# Patient Record
Sex: Male | Born: 1970 | Race: Asian | Hispanic: No | Marital: Married | Smoking: Former smoker
Health system: Southern US, Community
[De-identification: ages and names within clinical notes are randomized; demographics above are authoritative.]

## PROBLEM LIST (undated history)

## (undated) DIAGNOSIS — K52832 Lymphocytic colitis: Secondary | ICD-10-CM

## (undated) DIAGNOSIS — K7460 Unspecified cirrhosis of liver: Secondary | ICD-10-CM

## (undated) DIAGNOSIS — D61818 Other pancytopenia: Secondary | ICD-10-CM

## (undated) DIAGNOSIS — B1910 Unspecified viral hepatitis B without hepatic coma: Secondary | ICD-10-CM

## (undated) DIAGNOSIS — A159 Respiratory tuberculosis unspecified: Secondary | ICD-10-CM

## (undated) DIAGNOSIS — E119 Type 2 diabetes mellitus without complications: Secondary | ICD-10-CM

## (undated) HISTORY — DX: Unspecified viral hepatitis B without hepatic coma: B19.10

## (undated) HISTORY — DX: Respiratory tuberculosis unspecified: A15.9

## (undated) HISTORY — DX: Unspecified cirrhosis of liver: K74.60

## (undated) HISTORY — DX: Lymphocytic colitis: K52.832

## (undated) HISTORY — DX: Type 2 diabetes mellitus without complications: E11.9

---

## 2007-11-02 ENCOUNTER — Ambulatory Visit: Payer: Self-pay | Admitting: Family Medicine

## 2007-11-02 DIAGNOSIS — M545 Low back pain, unspecified: Secondary | ICD-10-CM | POA: Insufficient documentation

## 2007-11-27 ENCOUNTER — Emergency Department (HOSPITAL_COMMUNITY): Admission: EM | Admit: 2007-11-27 | Discharge: 2007-11-27 | Payer: Self-pay | Admitting: Emergency Medicine

## 2008-01-05 ENCOUNTER — Ambulatory Visit: Payer: Self-pay | Admitting: Family Medicine

## 2008-01-05 DIAGNOSIS — K219 Gastro-esophageal reflux disease without esophagitis: Secondary | ICD-10-CM

## 2008-01-28 ENCOUNTER — Emergency Department (HOSPITAL_COMMUNITY): Admission: EM | Admit: 2008-01-28 | Discharge: 2008-01-28 | Payer: Self-pay | Admitting: Emergency Medicine

## 2010-05-15 ENCOUNTER — Inpatient Hospital Stay (INDEPENDENT_AMBULATORY_CARE_PROVIDER_SITE_OTHER)
Admission: RE | Admit: 2010-05-15 | Discharge: 2010-05-15 | Disposition: A | Discharge status: Home / Self Care | Payer: Self-pay | Source: Ambulatory Visit | Attending: Family Medicine | Admitting: Family Medicine

## 2010-05-15 DIAGNOSIS — I889 Nonspecific lymphadenitis, unspecified: Secondary | ICD-10-CM

## 2010-06-25 ENCOUNTER — Inpatient Hospital Stay (INDEPENDENT_AMBULATORY_CARE_PROVIDER_SITE_OTHER)
Admission: RE | Admit: 2010-06-25 | Discharge: 2010-06-25 | Disposition: A | Discharge status: Home / Self Care | Payer: Self-pay | Source: Ambulatory Visit | Attending: Emergency Medicine | Admitting: Emergency Medicine

## 2010-06-25 ENCOUNTER — Ambulatory Visit (INDEPENDENT_AMBULATORY_CARE_PROVIDER_SITE_OTHER): Payer: Self-pay

## 2010-06-25 DIAGNOSIS — R05 Cough: Secondary | ICD-10-CM

## 2010-07-05 ENCOUNTER — Inpatient Hospital Stay (INDEPENDENT_AMBULATORY_CARE_PROVIDER_SITE_OTHER)
Admission: RE | Admit: 2010-07-05 | Discharge: 2010-07-05 | Disposition: A | Discharge status: Home / Self Care | Payer: Self-pay | Source: Ambulatory Visit | Attending: Family Medicine | Admitting: Family Medicine

## 2010-07-05 DIAGNOSIS — K219 Gastro-esophageal reflux disease without esophagitis: Secondary | ICD-10-CM

## 2010-07-11 ENCOUNTER — Encounter: Payer: Self-pay | Admitting: Internal Medicine

## 2010-07-16 ENCOUNTER — Encounter: Payer: Self-pay | Admitting: *Deleted

## 2010-07-16 ENCOUNTER — Encounter: Payer: Self-pay | Admitting: Internal Medicine

## 2010-07-16 ENCOUNTER — Ambulatory Visit (INDEPENDENT_AMBULATORY_CARE_PROVIDER_SITE_OTHER): Payer: Self-pay | Admitting: Internal Medicine

## 2010-07-16 VITALS — BP 90/60 | HR 61 | Temp 98.0°F | Ht 64.0 in | Wt 121.2 lb

## 2010-07-16 DIAGNOSIS — Z8611 Personal history of tuberculosis: Secondary | ICD-10-CM | POA: Insufficient documentation

## 2010-07-16 DIAGNOSIS — Z201 Contact with and (suspected) exposure to tuberculosis: Secondary | ICD-10-CM

## 2010-07-16 NOTE — Patient Instructions (Addendum)
As long as you don't develop a persistent cough, worse breathing, unexplained weight loss,  Soaking night sweats or chest pain breathing you don't need to return here.   I would recommend a yearly cxr within [facility].   Ok to return to work today

## 2010-07-16 NOTE — Progress Notes (Signed)
  Subjective:    Patient ID: Jose Snow, male    DOB: [birth year], 40 y.o.   MRN: [id number]  HPI 40 yo [place]ian quit smoking 1992 hospitalized in [place] around 2002 with dx of TB x 6 months then arrived here in Aug 2002 and eval [facility] and completed another 9 months and released.  07/16/2010 Initial pulmonary office eval for new cough x 2 weeks ago now resolved p seen in Urgent Care rx prednisone, abx and 100% back to nl at time of ov.  Pt denies any significant sore throat, dysphagia, itching, sneezing,  nasal congestion or excess/ purulent secretions,  fever, chills, sweats, unintended wt loss, pleuritic or exertional cp, hempoptysis, orthopnea pnd or leg swelling.    Also denies any obvious fluctuation of symptoms with weather or environmental changes or other aggravating or alleviating factors.     Review of Systems  Constitutional: Negative for fever, chills, activity change, appetite change and unexpected weight change.  HENT: Negative for congestion, sore throat, rhinorrhea, sneezing, trouble swallowing, dental problem, voice change and postnasal drip.   Eyes: Negative for visual disturbance.  Respiratory: Negative for cough, choking and shortness of breath.   Cardiovascular: Negative for chest pain and leg swelling.  Gastrointestinal: Negative for nausea, vomiting and abdominal pain.  Genitourinary: Negative for difficulty urinating.  Musculoskeletal: Negative for arthralgias.  Skin: Negative for rash.  Psychiatric/Behavioral: Negative for behavioral problems and confusion.       Objective:   Physical Exam    thin pleasant amb asian male nad  HEENT: nl dentition, turbinates, and orophanx. Nl external ear canals without cough reflex   NECK :  without JVD/Nodes/TM/ nl carotid upstrokes bilaterally   LUNGS: no acc muscle use, clear to A and P bilaterally without cough on insp or exp maneuvers   CV:  RRR  no s3 or murmur or increase in P2, no edema   ABD:  soft  and nontender with nl excursion in the supine position. No bruits or organomegaly, bowel sounds nl  MS:  warm without deformities, calf tenderness, cyanosis or clubbing  SKIN: warm and dry without lesions    NEURO:  alert, approp, no deficits    cxr 06/25/10 1. Partially calcified reticular nodular opacities, slightly upper  lobe predominant. Favor the sequelae of atypical bacterial  infection. Remote tuberculosis could have this appearance.  Comparison with prior radiographs versus further evaluation with  chest CT should be considered.  2. Left costophrenic angle blunting with patchy left base opacity,  likely scarring.   Assessment & Plan:

## 2010-07-17 ENCOUNTER — Encounter: Payer: Self-pay | Admitting: Internal Medicine

## 2010-07-17 NOTE — Assessment & Plan Note (Signed)
No evidence of active TB with complete resolution of symptoms and cxr c/w prevously treated TB  See instructions

## 2012-11-11 ENCOUNTER — Emergency Department (HOSPITAL_COMMUNITY)
Admission: EM | Admit: 2012-11-11 | Discharge: 2012-11-11 | Disposition: A | Discharge status: Home / Self Care | Payer: BC Managed Care – PPO | Source: Home / Self Care | Attending: Family Medicine | Admitting: Family Medicine

## 2012-11-11 ENCOUNTER — Encounter (HOSPITAL_COMMUNITY): Payer: Self-pay | Admitting: Emergency Medicine

## 2012-11-11 DIAGNOSIS — M545 Low back pain: Secondary | ICD-10-CM

## 2012-11-11 DIAGNOSIS — M5432 Sciatica, left side: Secondary | ICD-10-CM

## 2012-11-11 DIAGNOSIS — M5430 Sciatica, unspecified side: Secondary | ICD-10-CM

## 2012-11-11 MED ORDER — PREDNISONE 20 MG PO TABS: ORAL_TABLET | ORAL | Status: DC

## 2012-11-11 MED ORDER — TRAMADOL HCL 50 MG PO TABS: 50.0000 mg | ORAL_TABLET | Freq: Three times a day (TID) | ORAL | Status: DC | PRN

## 2012-11-11 MED ORDER — CYCLOBENZAPRINE HCL 10 MG PO TABS: 10.0000 mg | ORAL_TABLET | Freq: Two times a day (BID) | ORAL | Status: DC | PRN

## 2012-11-11 MED ORDER — IBUPROFEN 600 MG PO TABS: 600.0000 mg | ORAL_TABLET | Freq: Three times a day (TID) | ORAL | Status: DC | PRN

## 2012-11-11 NOTE — ED Notes (Signed)
Pt c/o left leg/thigh pain onset 1 week... Reports pain starts at lower back and radiates down left leg... Denies inj/trauma, strenuous activity, tingly/numbness... Alert w/no signs of acute distress.

## 2012-11-11 NOTE — ED Provider Notes (Signed)
CSN: [id number]     Arrival date & time 11/11/12  1025 History     First MD Initiated Contact with Patient 11/11/12 1115     Chief Complaint  Patient presents with  . Back Pain   (Consider location/radiation/quality/duration/timing/severity/associated sxs/prior Treatment) HPI Comments: 42 year old male originally from [place] but no significant past medical history. Here complaining of low back pain worse on the left side with radiation to the left posterior thigh for one week. Denies urinary symptoms like frequency dysuria hematuria, difficulty voiding or incontinence. Denies abdominal pain. Denies low extremity numbness or weakness. Denies recent trauma or falls. 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 He used to have a strange work where he had to [employer] may years ago back in [place]. No limping.   Past Medical History  Diagnosis Date  . 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    History reviewed. No pertinent past surgical history. No family history on file. History  Substance Use Topics  . Smoking status: Former Smoker -- 0.50 packs/day for 8 years    Types: Cigarettes    Quit date: 04/08/1990  . Smokeless tobacco: Never Used  . Alcohol Use: No    Review of Systems  Constitutional: Negative for fever, chills, diaphoresis, appetite change and fatigue.  HENT: Negative for neck pain.   Respiratory: Negative for cough and shortness of breath.   Gastrointestinal: Negative for nausea, abdominal pain, diarrhea and constipation.  Endocrine: Negative for cold intolerance, heat intolerance, polydipsia, polyphagia and polyuria.  Genitourinary: Negative for dysuria, hematuria and flank pain.  Musculoskeletal: Positive for back pain.  Skin: Negative for rash.  Neurological: Negative for dizziness and headaches.  All other systems reviewed and are negative.    Allergies  Review of patient's allergies indicates no  known allergies.  Home Medications   Current Outpatient Rx  Name  Route  Sig  Dispense  Refill  . cyclobenzaprine (FLEXERIL) 10 MG tablet   Oral   Take 1 tablet (10 mg total) by mouth 2 (two) times daily as needed for muscle spasms.   20 tablet   0   . ibuprofen (ADVIL,MOTRIN) 600 MG tablet   Oral   Take 1 tablet (600 mg total) by mouth every 8 (eight) hours as needed for pain.   21 tablet   0   . predniSONE (DELTASONE) 20 MG tablet      2 tabs po daily for 5 days   10 tablet   0   . traMADol (ULTRAM) 50 MG tablet   Oral   Take 1 tablet (50 mg total) by mouth every 8 (eight) hours as needed for pain.   21 tablet   0    BP 111/68  Pulse 56  Temp(Src) 97.2 F (36.2 C) (Oral)  Resp 16  SpO2 98% Physical Exam  Nursing note and vitals reviewed. Constitutional: He is oriented to person, place, and time. He appears well-developed and well-nourished. No distress.  HENT:  Head: Normocephalic and atraumatic.  Eyes: No scleral icterus.  Neck: Neck supple.  Cardiovascular: Normal heart sounds.   Pulmonary/Chest: Breath sounds normal.  Abdominal: Soft. He exhibits no mass. There is no tenderness.  Musculoskeletal:  Central spine with no scoliosis or kyphosis. Fair anterior flexion and posterior extension. Patient able to walk on tip toes but this triggered his pain with radiation to left posterior thigh. Able to walk on heels with no difficulty foot drop  or pain exacerbation. No bone prominence tenderness. Tenderness to palpation in bilateral lumbar paravertebral muscles worse in left side. Negative straight leg test bilateral. Intact sensation and symmetric + DTRs (rotullian and achillean) in low extremities.   Neurological: He is alert and oriented to person, place, and time.  Skin: No rash noted. He is not diaphoretic.    ED Course   Procedures (including critical care time)  Labs Reviewed - No data to display No results found. 1. Low back pain radiating to left  leg   2. Sciatica neuralgia, left     MDM  Possible S1 root nerve irritation/compression but no paresthesias or numbness possible transitory. Prescribed Flexeril, ibuprofen, prednisone and tramadol. Supportive care including rehabilitation exercises discussed with patient and provided in writing. Asked to establish primary care to monitor symptoms. Contact information for the [facility] provided.  Randa Spike, MD 11/12/12 [phone]

## 2012-11-17 ENCOUNTER — Encounter (HOSPITAL_COMMUNITY): Payer: Self-pay | Admitting: Emergency Medicine

## 2012-11-17 ENCOUNTER — Emergency Department (HOSPITAL_COMMUNITY)
Admission: EM | Admit: 2012-11-17 | Discharge: 2012-11-17 | Disposition: A | Discharge status: Home / Self Care | Payer: BC Managed Care – PPO | Source: Home / Self Care

## 2012-11-17 DIAGNOSIS — M5430 Sciatica, unspecified side: Secondary | ICD-10-CM

## 2012-11-17 DIAGNOSIS — M5432 Sciatica, left side: Secondary | ICD-10-CM

## 2012-11-17 MED ORDER — TRAMADOL HCL 50 MG PO TABS: 50.0000 mg | ORAL_TABLET | Freq: Three times a day (TID) | ORAL | Status: DC | PRN

## 2012-11-17 NOTE — ED Notes (Signed)
Pt states that the med's that were given on 8/6 caused skin to itch and have a burning sensation also causing stomach to be up set.  Pt states that he stopped taking med. Would like to try different medication for leg pain

## 2012-11-17 NOTE — ED Provider Notes (Signed)
Jose Snow is a 42 y.o. male who presents to Urgent Care today for left leg radicular pain. He was seen on August 8 for the same problem and treated with prednisone Dosepak, tramadol, ibuprofen, and Flexeril. He notes that his pain is worsened however he denies any numbness or weakness. Additionally he denies any bowel bladder dysfunction. He notes that he developed some mild abdominal discomfort as well as fatigue with the medications. He denies any injury fevers or chills and feels well otherwise.   PMH reviewed. healthy  History  Substance Use Topics  . Smoking status: Former Smoker -- 0.50 packs/day for 8 years    Types: Cigarettes    Quit date: 04/08/1990  . Smokeless tobacco: Never Used  . Alcohol Use: No   ROS as above Medications reviewed. No current facility-administered medications for this encounter.   Current Outpatient Prescriptions  Medication Sig Dispense Refill  . cyclobenzaprine (FLEXERIL) 10 MG tablet Take 1 tablet (10 mg total) by mouth 2 (two) times daily as needed for muscle spasms.  20 tablet  0  . predniSONE (DELTASONE) 20 MG tablet 2 tabs po daily for 5 days  10 tablet  0  . traMADol (ULTRAM) 50 MG tablet Take 1 tablet (50 mg total) by mouth every 8 (eight) hours as needed for pain.  30 tablet  0    Exam:  BP 121/69  Pulse 80  Temp(Src) 98.2 F (36.8 C) (Oral)  Resp 17  SpO2 100% Gen: Well NAD HEENT: EOMI,  MMM Lungs: CTABL Nl WOB Heart: RRR no MRG Abd: NABS, NT, ND Exts: Non edematous BL  LE, warm and well perfused.  Back: Nontender spinal midline. Normal range of motion. Left lower extremity is normal-appearing with normal sensation. Reflexes are intact and equal bilateral knees and ankles. Strength is intact bilateral lower extremities. Patient stands heels and toes and has a normal gait.   No results found for this or any previous visit (from the past 24 hour(s)). No results found.  Assessment and Plan: 42 y.o. male with left leg radicular pain. His  symptoms have worsened. Plan to refer to orthopedics for further evaluation. Patient will likely require an MRI and possibly an epidural steroid injection.  I feel that the abdominal pain is likely due to ibuprofen and we have asked the patient to discontinue this medication. His fatigue is likely due to Flexeril and we will use this medication only at night. I have refilled his tramadol. Discussed warning signs or symptoms. Please see discharge instructions. Patient expresses understanding.      Gregor Hams, MD 11/17/12 [phone]

## 2012-11-17 NOTE — ED Notes (Signed)
Pt's daughter states that they are unsure which caused the reaction because he would take a pill from all four bottles at the same time.

## 2013-04-10 ENCOUNTER — Emergency Department (INDEPENDENT_AMBULATORY_CARE_PROVIDER_SITE_OTHER): Payer: BC Managed Care – PPO

## 2013-04-10 ENCOUNTER — Emergency Department (HOSPITAL_COMMUNITY)
Admission: EM | Admit: 2013-04-10 | Discharge: 2013-04-10 | Disposition: A | Discharge status: Home / Self Care | Payer: BC Managed Care – PPO | Source: Home / Self Care

## 2013-04-10 ENCOUNTER — Encounter (HOSPITAL_COMMUNITY): Payer: Self-pay | Admitting: Emergency Medicine

## 2013-04-10 DIAGNOSIS — J8410 Pulmonary fibrosis, unspecified: Secondary | ICD-10-CM

## 2013-04-10 DIAGNOSIS — J984 Other disorders of lung: Secondary | ICD-10-CM

## 2013-04-10 DIAGNOSIS — J45909 Unspecified asthma, uncomplicated: Secondary | ICD-10-CM

## 2013-04-10 MED ORDER — ALBUTEROL SULFATE (2.5 MG/3ML) 0.083% IN NEBU
INHALATION_SOLUTION | RESPIRATORY_TRACT | Status: AC
  Filled 2013-04-10: qty 3

## 2013-04-10 MED ORDER — METHYLPREDNISOLONE 4 MG PO TABS: 4.0000 mg | ORAL_TABLET | Freq: Every day | ORAL | Status: DC

## 2013-04-10 MED ORDER — ALBUTEROL SULFATE HFA 108 (90 BASE) MCG/ACT IN AERS
2.0000 | INHALATION_SPRAY | RESPIRATORY_TRACT | Status: DC | PRN
Start: 2013-04-10 — End: 2014-11-26

## 2013-04-10 MED ORDER — IPRATROPIUM BROMIDE 0.02 % IN SOLN
RESPIRATORY_TRACT | Status: AC
  Filled 2013-04-10: qty 2.5

## 2013-04-10 MED ORDER — IPRATROPIUM-ALBUTEROL 0.5-2.5 (3) MG/3ML IN SOLN
3.0000 mL | Freq: Once | RESPIRATORY_TRACT | Status: AC
  Administered 2013-04-10: 3 mL via RESPIRATORY_TRACT

## 2013-04-10 NOTE — ED Notes (Signed)
Patient receiving breathing treatment.  Unable to transport to x-ray

## 2013-04-10 NOTE — ED Notes (Signed)
2 weeks of coughing, at times he has emesis with it.  He has had fever on and off.  He also has a  sore throat

## 2013-04-10 NOTE — Discharge Instructions (Signed)
Bronchospasm, Adult A bronchospasm is a spasm or tightening of the airways going into the lungs. During a bronchospasm breathing becomes more difficult because the airways get smaller. When this happens there can be coughing, a whistling sound when breathing (wheezing), and difficulty breathing. Bronchospasm is often associated with asthma, but not all patients who experience a bronchospasm have asthma. CAUSES  A bronchospasm is caused by inflammation or irritation of the airways. The inflammation or irritation may be triggered by:   Allergies (such as to animals, pollen, food, or mold). Allergens that cause bronchospasm may cause wheezing immediately after exposure or many hours later.   Infection. Viral infections are believed to be the most common cause of bronchospasm.   Exercise.   Irritants (such as pollution, cigarette smoke, strong odors, aerosol sprays, and paint fumes).   Weather changes. Winds increase molds and pollens in the air. Rain refreshes the air by washing irritants out. Cold air may cause inflammation.   Stress and emotional upset.  SIGNS AND SYMPTOMS   Wheezing.   Excessive nighttime coughing.   Frequent or severe coughing with a simple cold.   Chest tightness.   Shortness of breath.  DIAGNOSIS  Bronchospasm is usually diagnosed through a history and physical exam. Tests, such as chest X-rays, are sometimes done to look for other conditions. TREATMENT   Inhaled medicines can be given to open up your airways and help you breathe. The medicines can be given using either an inhaler or a nebulizer machine.  Corticosteroid medicines may be given for severe bronchospasm, usually when it is associated with asthma. HOME CARE INSTRUCTIONS   Always have a plan prepared for seeking medical care. Know when to call your health care provider and local emergency services (911 in the [place] Know where you can access local emergency care.  Only take medicines as  directed by your health care provider.  If you were prescribed an inhaler or nebulizer machine, ask your health care provider to explain how to use it correctly. Always use a spacer with your inhaler if you were given one.  It is necessary to remain calm during an attack. Try to relax and breathe more slowly.  Control your home environment in the following ways:   Change your heating and air conditioning filter at least once a month.   Limit your use of fireplaces and wood stoves.  Do not smoke and do not allow smoking in your home.   Avoid exposure to perfumes and fragrances.   Get rid of pests (such as roaches and mice) and their droppings.   Throw away plants if you see mold on them.   Keep your house clean and dust free.   Replace carpet with wood, tile, or vinyl flooring. Carpet can trap dander and dust.   Use allergy-proof pillows, mattress covers, and box spring covers.   Wash bed sheets and blankets every week in hot water and dry them in a dryer.   Use blankets that are made of polyester or cotton.   Wash hands frequently. SEEK MEDICAL CARE IF:   You have muscle aches.   You have chest pain.   The sputum changes from clear or white to yellow, green, gray, or bloody.   The sputum you cough up gets thicker.   There are problems that may be related to the medicine you are given, such as a rash, itching, swelling, or trouble breathing.  SEEK IMMEDIATE MEDICAL CARE IF:   You have worsening wheezing and coughing  even after taking your prescribed medicines.   You have increased difficulty breathing.   You develop severe chest pain. MAKE SURE YOU:   Understand these instructions.  Will watch your condition.  Will get help right away if you are not doing well or get worse. Document Released: 03/28/2003 Document Revised: 11/25/2012 Document Reviewed: 09/14/2012 [facility] Patient Information 2014 [place].  Cough, Adult  A cough is a  reflex that helps clear your throat and airways. It can help heal the body or may be a reaction to an irritated airway. A cough may only last 2 or 3 weeks (acute) or may last more than 8 weeks (chronic).  CAUSES Acute cough:  Viral or bacterial infections. Chronic cough:  Infections.  Allergies.  Asthma.  Post-nasal drip.  Smoking.  Heartburn or acid reflux.  Some medicines.  Chronic lung problems (COPD).  Cancer. SYMPTOMS   Cough.  Fever.  Chest pain.  Increased breathing rate.  High-pitched whistling sound when breathing (wheezing).  Colored mucus that you cough up (sputum). TREATMENT   A bacterial cough may be treated with antibiotic medicine.  A viral cough must run its course and will not respond to antibiotics.  Your caregiver may recommend other treatments if you have a chronic cough. HOME CARE INSTRUCTIONS   Only take over-the-counter or prescription medicines for pain, discomfort, or fever as directed by your caregiver. Use cough suppressants only as directed by your caregiver.  Use a cold steam vaporizer or humidifier in your bedroom or home to help loosen secretions.  Sleep in a semi-upright position if your cough is worse at night.  Rest as needed.  Stop smoking if you smoke. SEEK IMMEDIATE MEDICAL CARE IF:   You have pus in your sputum.  Your cough starts to worsen.  You cannot control your cough with suppressants and are losing sleep.  You begin coughing up blood.  You have difficulty breathing.  You develop pain which is getting worse or is uncontrolled with medicine.  You have a fever. MAKE SURE YOU:   Understand these instructions.  Will watch your condition.  Will get help right away if you are not doing well or get worse. Document Released: 09/21/2010 Document Revised: 06/17/2011 Document Reviewed: 09/21/2010 [facility] Patient Information 2014 [place].  Using Your Inhaler Proper inhaler technique is very  important. Good technique assures that the medicine reaches the lungs. Poor technique results in depositing the medicine on the tongue and back of the throat rather than in the airways. STEPS TO FOLLOW IF USING INHALER WITHOUT EXTENSION TUBE: 1. Remove cap from inhaler. 2. Shake inhaler for 5 seconds before each inhalation (breathing in). 3. Position the inhaler so that the top of the canister faces up. 4. Put your index finger on the top of the medication canister. Your thumb supports the bottom of the inhaler. 5. Open your mouth. 6. Hold the inhaler 1 to 2 inches away from your open mouth. This allows the medicine to slow down before the medicine enters the mouth. 7. Exhale (breathe out) normally and as completely as possible. 8. Press the canister down with the index finger to release the medication. 9. At the same time as the canister is pressed, inhale deeply and slowly until the lungs are completely filled. This should take 4 to 6 seconds. Keep your tongue down and out of the way. 10. Hold the medication in your lungs for up to 10 seconds (10 seconds is best). This helps the medicine get into the  small airways of your lungs to work better. 11. Breathe out slowly, through pursed lips. Whistling is an example of pursed lips. 12. Wait at least 1 minute between puffs. Continue with the above steps until you have taken the number of puffs your caregiver has ordered. 13. Replace cap on inhaler. STEPS TO FOLLOW USING AN INHALER WITH AN EXTENSION (SPACER): 1.  Remove cap from inhaler. 2. Shake inhaler for 5 seconds before each inhalation (breathing in). 3. Your caregiver has asked you to use a spacer with your inhaler. A spacer is a plastic tube with a mouthpiece on one end and an opening that connects to the inhaler on the other end. A spacer helps you take the medicine better. 4. Place the open end of the spacer onto the mouthpiece of the inhaler. 5. Position the inhaler so that the top of the  canister faces up and the spacer mouthpiece faces you. 6. Put your index finger on the top of the medication canister. Your thumb supports the bottom of the inhaler and the spacer. 7. Exhale (breathe out) normally and as completely as possible. 8. Immediately after exhaling, place the spacer between your teeth and into your mouth. Close your mouth tightly around the spacer. 9. Press the canister down with the index finger to release the medication. 10. At the same time as the canister is pressed, inhale deeply and slowly until the lungs are completely filled. This should take 4 to 6 seconds. Keep your tongue down and out of the way. 11. Hold the medication in your lungs for up to 10 seconds (10 seconds is best). This helps the medicine get into the small airways of your lungs to work better. Exhale. 12. Repeat inhaling deeply through the spacer mouthpiece. Again hold that breath for up to 10 seconds (10 seconds is best). Exhale slowly. If it is difficult to take this second deep breath through the spacer, breathe normally several times through the spacer. Remove the spacer from your mouth. 13. Wait at least 1 minute between puffs. Continue with the above steps until you have taken the number of puffs your caregiver has ordered. 14. Remove spacer from the inhaler and place cap on inhaler. If you are using different kinds of inhalers, use your quick relief medicine to open the airways 10 - 15 minutes before using a steroid. If you are unsure which inhalers to use and the order of using them, ask your caregiver, nurse, or respiratory therapist. If you are using a steroid inhaler, rinse your mouth with water after your last puff and then spit out the water. Do not swallow the water. Avoid the following:  Inhaling before or after starting the spray of medicine. It takes practice to coordinate your breathing with triggering the spray.  Inhaling through the nose (rather than the mouth) when triggering the  spray. HOW TO DETERMINE IF YOUR INHALER IS FULL OR NEARLY EMPTY:  Determine when an inhaler is empty. You cannot know when an inhaler is empty by shaking it. A few inhalers are now being made with dose counters. Ask your caregiver for a prescription that has a dose counter if you feel you need that extra help.  If your inhaler does not have a counter, check the number of doses in the inhaler before you use it. The canister or box will list the number of doses in the canister. Divide the total number of doses in the canister by the number you will use each day to find  how many days the canister will last. (For example, if your canister has 200 doses and you take 2 puffs, 4 times each day, which is 8 puffs a day. Dividing 200 by 8 equals 25. The canister should last 25 days.) Using a calendar, count forward that many days to see when your inhaler will run out. Write the refill date on a calendar or your canister.  Remember, if you need to take extra doses, the inhaler will empty sooner than you figured. Be sure you have a refill before your canister runs out. Refill your inhaler 7 to 10 days before it runs out. HOME CARE INSTRUCTIONS   Do not use the inhaler more than your caregiver tells you. If you are still wheezing and are feeling tightness in your chest, call your caregiver.  Keep an adequate supply of medication. This includes making sure the medicine is not expired, and you have a spare inhaler.  Follow your caregiver or inhaler insert directions for cleaning the inhaler and spacer. SEEK MEDICAL CARE IF:   Symptoms are only partially relieved with your inhaler.  You are having trouble using your inhaler.  You experience some increase in phlegm.  You develop a fever of 100.5 F (38.1 C). SEEK IMMEDIATE MEDICAL CARE IF:   You feel little or no relief with your inhalers. You are still wheezing and are feeling shortness of breath and/or tightness in your chest.  You have side effects  such as dizziness, headaches or fast heart rate.  You have chills, fever, night sweats or an oral temperature above 102 F (38.9 C).  Phlegm production increases a lot, or there is blood in the phlegm. MAKE SURE YOU:   Understand these instructions.  Will watch your condition.  Will get help right away if you are not doing well or get worse. Document Released: 03/22/2000 Document Revised: 06/17/2011 Document Reviewed: 01/10/2009 [facility] Patient Information 2014 [place].

## 2013-04-10 NOTE — ED Provider Notes (Signed)
Medical screening examination/treatment/procedure(s) were performed by resident physician or non-physician practitioner and as supervising physician I was immediately available for consultation/collaboration.   Pauline Good MD.   Billy Fischer, MD 04/10/13 2022

## 2013-04-10 NOTE — ED Provider Notes (Signed)
CSN: [id number]     Arrival date & time 04/10/13  1040 History   First MD Initiated Contact with Patient 04/10/13 1301     Chief Complaint  Patient presents with  . Cough   (Consider location/radiation/quality/duration/timing/severity/associated sxs/prior Treatment) HPI Comments: 43 year old  male complaining of a cough for 2 weeks it is worsened in the past 2 days. Sometimes he has coughing spasms that produces post tussive emesis. He has a scratchy and sore throat. He had a fever a few days ago but not in the past 2-3 days. No nasal congestion.   Past Medical History  Diagnosis Date  . 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    History reviewed. No pertinent past surgical history. History reviewed. No pertinent family history. History  Substance Use Topics  . Smoking status: Former Smoker -- 0.50 packs/day for 8 years    Types: Cigarettes    Quit date: 04/08/1990  . Smokeless tobacco: Never Used  . Alcohol Use: No    Review of Systems  Constitutional: Positive for fever and activity change. Negative for diaphoresis and fatigue.  HENT: Positive for congestion, postnasal drip and sore throat. Negative for ear pain, facial swelling, rhinorrhea and trouble swallowing.   Eyes: Negative for pain, discharge and redness.  Respiratory: Positive for cough. Negative for chest tightness and shortness of breath.   Cardiovascular: Negative.   Gastrointestinal: Negative.   Musculoskeletal: Negative.  Negative for neck pain and neck stiffness.  Neurological: Negative.     Allergies  Review of patient's allergies indicates no known allergies.  Home Medications   Current Outpatient Rx  Name  Route  Sig  Dispense  Refill  . albuterol (PROVENTIL HFA;VENTOLIN HFA) 108 (90 BASE) MCG/ACT inhaler   Inhalation   Inhale 2 puffs into the lungs every 4 (four) hours as needed for wheezing or shortness of breath.   1 Inhaler   0   . methylPREDNISolone (MEDROL) 4 MG tablet  Oral   Take 1 tablet (4 mg total) by mouth daily.   21 tablet   0    BP 119/69  Pulse 82  Temp(Src) 97.9 F (36.6 C) (Oral)  Resp 18  SpO2 100% Physical Exam  Nursing note and vitals reviewed. Constitutional: He is oriented to person, place, and time. He appears well-developed and well-nourished. No distress.  HENT:  Bilateral TMs are normal Oropharynx with minor erythema, cobblestoning and clear PND  Eyes: Conjunctivae and EOM are normal.  Neck: Normal range of motion. Neck supple.  Cardiovascular: Normal rate, regular rhythm and normal heart sounds.   Pulmonary/Chest: Effort normal and breath sounds normal. No respiratory distress.  Mildly prolonged expiratory phase Bilateral breath sounds diminished in all fields. No wheezes with forced tidal volume. Diffuse coarseness associated with cough.  Musculoskeletal: Normal range of motion. He exhibits no edema.  Lymphadenopathy:    He has no cervical adenopathy.  Neurological: He is alert and oriented to person, place, and time.  Skin: Skin is warm and dry. No rash noted.  Psychiatric: He has a normal mood and affect.    ED Course  Procedures (including critical care time) Labs Review Labs Reviewed - No data to display Imaging Review Dg Chest 2 View  04/10/2013   CLINICAL DATA:  cough, fever, hx Tb  EXAM: CHEST  2 VIEW  COMPARISON:  DG CHEST 2 VIEW dated 06/25/2010  FINDINGS: There is biapical fibrosis. There is a linear band of airspace disease in need left  lower lobe likely reflecting fibrosis. There is no new focal parenchymal opacity. There is blunting of the left lateral costophrenic angle which may reflect a trace pleural effusion versus scarring which is unchanged compared with 06/25/2010. There is no right pleural effusion. There is no pneumothorax. Stable cardiomediastinal silhouette.  IMPRESSION: 1. No active cardiopulmonary disease. 2. Chronic changes at bilateral lung apices and left lower lobe.   Electronically Signed    By: Kathreen Devoid   On: 04/10/2013 14:00      MDM   1. RAD (reactive airway disease) with wheezing   2. Pulmonary scarring       Patient much improved post duo neb. There are no further wheezes. Any is not currently coughing. Albuterol HFA 2 puffs every 4 hours when necessary cough Medrol Dosepak. Followup with your PCP as needed.    Janne Napoleon, NP 04/10/13 1431

## 2014-11-25 ENCOUNTER — Inpatient Hospital Stay (HOSPITAL_COMMUNITY)
Admission: EM | Admit: 2014-11-25 | Discharge: 2014-11-26 | DRG: 392 | Disposition: A | Discharge status: Home / Self Care | Payer: 59 | Attending: Internal Medicine | Admitting: Internal Medicine

## 2014-11-25 ENCOUNTER — Emergency Department (HOSPITAL_COMMUNITY): Payer: 59

## 2014-11-25 ENCOUNTER — Encounter (HOSPITAL_COMMUNITY): Payer: Self-pay | Admitting: Vascular Surgery

## 2014-11-25 DIAGNOSIS — R161 Splenomegaly, not elsewhere classified: Secondary | ICD-10-CM | POA: Diagnosis present

## 2014-11-25 DIAGNOSIS — R112 Nausea with vomiting, unspecified: Secondary | ICD-10-CM | POA: Diagnosis not present

## 2014-11-25 DIAGNOSIS — D696 Thrombocytopenia, unspecified: Secondary | ICD-10-CM

## 2014-11-25 DIAGNOSIS — Z87891 Personal history of nicotine dependence: Secondary | ICD-10-CM | POA: Diagnosis not present

## 2014-11-25 DIAGNOSIS — B181 Chronic viral hepatitis B without delta-agent: Secondary | ICD-10-CM | POA: Diagnosis present

## 2014-11-25 DIAGNOSIS — R1031 Right lower quadrant pain: Secondary | ICD-10-CM | POA: Insufficient documentation

## 2014-11-25 DIAGNOSIS — R1011 Right upper quadrant pain: Secondary | ICD-10-CM | POA: Diagnosis not present

## 2014-11-25 DIAGNOSIS — K2970 Gastritis, unspecified, without bleeding: Secondary | ICD-10-CM | POA: Diagnosis not present

## 2014-11-25 DIAGNOSIS — B1910 Unspecified viral hepatitis B without hepatic coma: Secondary | ICD-10-CM

## 2014-11-25 DIAGNOSIS — I864 Gastric varices: Secondary | ICD-10-CM | POA: Diagnosis present

## 2014-11-25 DIAGNOSIS — R109 Unspecified abdominal pain: Secondary | ICD-10-CM

## 2014-11-25 DIAGNOSIS — K7581 Nonalcoholic steatohepatitis (NASH): Secondary | ICD-10-CM

## 2014-11-25 DIAGNOSIS — K529 Noninfective gastroenteritis and colitis, unspecified: Secondary | ICD-10-CM | POA: Diagnosis present

## 2014-11-25 DIAGNOSIS — K7460 Unspecified cirrhosis of liver: Secondary | ICD-10-CM | POA: Diagnosis not present

## 2014-11-25 DIAGNOSIS — R17 Unspecified jaundice: Secondary | ICD-10-CM | POA: Diagnosis not present

## 2014-11-25 DIAGNOSIS — I8500 Esophageal varices without bleeding: Secondary | ICD-10-CM | POA: Diagnosis present

## 2014-11-25 DIAGNOSIS — K766 Portal hypertension: Secondary | ICD-10-CM | POA: Diagnosis present

## 2014-11-25 DIAGNOSIS — D72819 Decreased white blood cell count, unspecified: Secondary | ICD-10-CM | POA: Diagnosis present

## 2014-11-25 DIAGNOSIS — Z8611 Personal history of tuberculosis: Secondary | ICD-10-CM

## 2014-11-25 DIAGNOSIS — D689 Coagulation defect, unspecified: Secondary | ICD-10-CM | POA: Diagnosis present

## 2014-11-25 LAB — COMPREHENSIVE METABOLIC PANEL
ALBUMIN: 4.1 g/dL (ref 3.5–5.0)
ALK PHOS: 67 U/L (ref 38–126)
ALT: 30 U/L (ref 17–63)
ANION GAP: 12 (ref 5–15)
AST: 44 U/L — ABNORMAL HIGH (ref 15–41)
BUN: 8 mg/dL (ref 6–20)
CHLORIDE: 106 mmol/L (ref 101–111)
CO2: 22 mmol/L (ref 22–32)
CREATININE: 1.22 mg/dL (ref 0.61–1.24)
Calcium: 9 mg/dL (ref 8.9–10.3)
GFR calc non Af Amer: >60 mL/min (ref >60)
GLUCOSE: 76 mg/dL (ref 65–99)
Potassium: 3.8 mmol/L (ref 3.5–5.1)
SODIUM: 140 mmol/L (ref 135–145)
Total Bilirubin: 1.8 mg/dL — ABNORMAL HIGH (ref 0.3–1.2)
Total Protein: 7.2 g/dL (ref 6.5–8.1)

## 2014-11-25 LAB — URINE MICROSCOPIC-ADD ON

## 2014-11-25 LAB — CBC
HCT: 44.2 % (ref 39.0–52.0)
HEMOGLOBIN: 15.2 g/dL (ref 13.0–17.0)
MCH: 30.9 pg (ref 26.0–34.0)
MCHC: 34.4 g/dL (ref 30.0–36.0)
MCV: 89.8 fL (ref 78.0–100.0)
Platelets: 33 10*3/uL — ABNORMAL LOW (ref 150–400)
RBC: 4.92 MIL/uL (ref 4.22–5.81)
RDW: 14.4 % (ref 11.5–15.5)
WBC: 4.4 10*3/uL (ref 4.0–10.5)

## 2014-11-25 LAB — URINALYSIS, ROUTINE W REFLEX MICROSCOPIC
GLUCOSE, UA: NEGATIVE mg/dL
HGB URINE DIPSTICK: NEGATIVE
Ketones, ur: 15 mg/dL — AB
Nitrite: NEGATIVE
PH: 6 (ref 5.0–8.0)
Protein, ur: 30 mg/dL — AB
Specific Gravity, Urine: 1.026 (ref 1.005–1.030)
Urobilinogen, UA: 1 mg/dL (ref 0.0–1.0)

## 2014-11-25 LAB — IRON AND TIBC
Iron: 31 ug/dL — ABNORMAL LOW (ref 45–182)
SATURATION RATIOS: 14 % — AB (ref 17.9–39.5)
TIBC: 230 ug/dL — AB (ref 250–450)
UIBC: 199 ug/dL

## 2014-11-25 LAB — PROTIME-INR
INR: 1.53 — ABNORMAL HIGH (ref 0.00–1.49)
Prothrombin Time: 18.5 seconds — ABNORMAL HIGH (ref 11.6–15.2)

## 2014-11-25 LAB — LIPASE, BLOOD: LIPASE: 45 U/L (ref 22–51)

## 2014-11-25 LAB — I-STAT TROPONIN, ED: Troponin i, poc: 0 ng/mL (ref 0.00–0.08)

## 2014-11-25 LAB — TSH: TSH: 0.7 u[IU]/mL (ref 0.350–4.500)

## 2014-11-25 LAB — FERRITIN: Ferritin: 340 ng/mL — ABNORMAL HIGH (ref 24–336)

## 2014-11-25 MED ORDER — ONDANSETRON HCL 4 MG PO TABS: 4.0000 mg | ORAL_TABLET | Freq: Four times a day (QID) | ORAL | Status: DC | PRN

## 2014-11-25 MED ORDER — IOHEXOL 300 MG/ML  SOLN: 25.0000 mL | Freq: Once | INTRAMUSCULAR | Status: DC | PRN

## 2014-11-25 MED ORDER — FENTANYL CITRATE (PF) 100 MCG/2ML IJ SOLN
50.0000 ug | Freq: Once | INTRAMUSCULAR | Status: AC
  Administered 2014-11-25: 50 ug via INTRAVENOUS
  Filled 2014-11-25: qty 2

## 2014-11-25 MED ORDER — ALBUTEROL SULFATE HFA 108 (90 BASE) MCG/ACT IN AERS: 2.0000 | INHALATION_SPRAY | RESPIRATORY_TRACT | Status: DC | PRN

## 2014-11-25 MED ORDER — SODIUM CHLORIDE 0.9 % IV SOLN: 250.0000 mL | INTRAVENOUS | Status: DC | PRN

## 2014-11-25 MED ORDER — ONDANSETRON HCL 4 MG/2ML IJ SOLN: 4.0000 mg | Freq: Four times a day (QID) | INTRAMUSCULAR | Status: DC | PRN

## 2014-11-25 MED ORDER — IOHEXOL 300 MG/ML  SOLN
100.0000 mL | Freq: Once | INTRAMUSCULAR | Status: AC | PRN
  Administered 2014-11-25: 100 mL via INTRAVENOUS

## 2014-11-25 MED ORDER — ONDANSETRON HCL 4 MG/2ML IJ SOLN
4.0000 mg | Freq: Once | INTRAMUSCULAR | Status: AC
  Administered 2014-11-25: 4 mg via INTRAVENOUS
  Filled 2014-11-25: qty 2

## 2014-11-25 MED ORDER — ALUM & MAG HYDROXIDE-SIMETH 200-200-20 MG/5ML PO SUSP: 30.0000 mL | Freq: Four times a day (QID) | ORAL | Status: DC | PRN

## 2014-11-25 MED ORDER — OXYCODONE HCL 5 MG PO TABS
5.0000 mg | ORAL_TABLET | ORAL | Status: DC | PRN
  Administered 2014-11-25: 5 mg via ORAL
  Filled 2014-11-25: qty 1

## 2014-11-25 MED ORDER — SODIUM CHLORIDE 0.9 % IJ SOLN: 3.0000 mL | INTRAMUSCULAR | Status: DC | PRN

## 2014-11-25 MED ORDER — ALBUTEROL SULFATE (2.5 MG/3ML) 0.083% IN NEBU: 2.5000 mg | INHALATION_SOLUTION | RESPIRATORY_TRACT | Status: DC | PRN

## 2014-11-25 MED ORDER — SODIUM CHLORIDE 0.9 % IV BOLUS (SEPSIS)
1000.0000 mL | Freq: Once | INTRAVENOUS | Status: AC
  Administered 2014-11-25: 1000 mL via INTRAVENOUS

## 2014-11-25 MED ORDER — SODIUM CHLORIDE 0.9 % IJ SOLN
3.0000 mL | Freq: Two times a day (BID) | INTRAMUSCULAR | Status: DC
  Administered 2014-11-25 – 2014-11-26 (×2): 3 mL via INTRAVENOUS

## 2014-11-25 MED ORDER — MORPHINE SULFATE (PF) 4 MG/ML IV SOLN
4.0000 mg | Freq: Once | INTRAVENOUS | Status: DC
  Filled 2014-11-25: qty 1

## 2014-11-25 NOTE — ED Notes (Signed)
Attempted report x 1.  RN to call this RN when ready.

## 2014-11-25 NOTE — ED Notes (Signed)
Pt reports to the ED for eval of N/V, abd pain, and dizziness. Reports the symptoms began around 9 am. The dizziness and the abd pain developed after the emesis. Reports he started vomiting after he ate a fish and vegetable soup this am. Denies any hematemesis, diarrhea, or blood in his stool. Pt A&Ox4, resp e/u, and skin warm and dry.

## 2014-11-25 NOTE — ED Provider Notes (Signed)
CSN: [id number]     Arrival date & time 11/25/14  1129 History   First MD Initiated Contact with Patient 11/25/14 1257     Chief Complaint  Patient presents with  . Abdominal Pain  . Emesis     (Consider location/radiation/quality/duration/timing/severity/associated sxs/prior Treatment) HPI Comments: Jose Snow is a 44 y.o. male with a PMHx of TB s/p treatment, who presents to the ED with complaints of sudden onset abdominal pain that started around 9 AM after he finished eating fish and veggie soup with rice. He reports the pain is 8/10 sharp intermittent right lower quadrant pain which is nonradiating, worse with breathing, with no treatments tried prior to arrival. He endorses associated nausea and 4 episodes of nonbloody nonbilious emesis. After he was vomiting he felt somewhat lightheaded, but this entirely resolved prior to arrival. He had a similar episode last year, but he cannot recall what his diagnosis was. He denies any fevers, chills, chest pain, shortness breath, diarrhea, constipation, melena, hematochezia, obstipation, rectal pain, dysuria, hematuria, flank pain, numbness, tingling, weakness, recent travel, sick contacts, alcohol use, NSAID use, or recent antibiotics. He has no primary care doctor.  Patient is a 44 y.o. male presenting with abdominal pain and vomiting. The history is provided by the patient. A language interpreter was used (daughter).  Abdominal Pain Pain location:  RLQ Pain quality: sharp   Pain radiates to:  Does not radiate Pain severity:  Moderate Onset quality:  Sudden Duration:  4 hours Timing:  Intermittent Progression:  Unchanged Chronicity:  Recurrent Context: suspicious food intake   Context: not recent travel and not sick contacts   Relieved by:  None tried Worsened by:  Deep breathing Ineffective treatments:  None tried Associated symptoms: nausea and vomiting   Associated symptoms: no chest pain, no chills, no constipation, no diarrhea,  no dysuria, no fever, no flatus, no hematemesis, no hematochezia, no hematuria, no melena and no shortness of breath   Risk factors: no alcohol abuse and no NSAID use   Emesis Associated symptoms: abdominal pain   Associated symptoms: no arthralgias, no chills, no diarrhea and no myalgias     Past Medical History  Diagnosis Date  . 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    History reviewed. No pertinent past surgical history. No family history on file. Social History  Substance Use Topics  . Smoking status: Former Smoker -- 0.50 packs/day for 8 years    Types: Cigarettes    Quit date: 04/08/1990  . Smokeless tobacco: Never Used  . Alcohol Use: No    Review of Systems  Constitutional: Negative for fever and chills.  Respiratory: Negative for shortness of breath.   Cardiovascular: Negative for chest pain.  Gastrointestinal: Positive for nausea, vomiting and abdominal pain. Negative for diarrhea, constipation, blood in stool, melena, hematochezia, anal bleeding, rectal pain, flatus and hematemesis.  Genitourinary: Negative for dysuria, hematuria and flank pain.  Musculoskeletal: Negative for myalgias and arthralgias.  Skin: Negative for rash.  Allergic/Immunologic: Negative for immunocompromised state.  Neurological: Positive for light-headedness (resolved, only occurred after he vomited earlier ). Negative for syncope, weakness and numbness.  Psychiatric/Behavioral: Negative for confusion.   10 Systems reviewed and are negative for acute change except as noted in the HPI.    Allergies  Review of patient's allergies indicates no known allergies.  Home Medications   Prior to Admission medications   Medication Sig Start Date End Date Taking? Authorizing Provider  albuterol (PROVENTIL  HFA;VENTOLIN HFA) 108 (90 BASE) MCG/ACT inhaler Inhale 2 puffs into the lungs every 4 (four) hours as needed for wheezing or shortness of breath. 04/10/13   Janne Napoleon, NP    methylPREDNISolone (MEDROL) 4 MG tablet Take 1 tablet (4 mg total) by mouth daily. 04/10/13   Janne Napoleon, NP   BP 119/48 mmHg  Pulse 81  Temp(Src) 97.9 F (36.6 C) (Oral)  Resp 16  SpO2 96% Physical Exam  Constitutional: He is oriented to person, place, and time. Vital signs are normal. He appears well-developed and well-nourished.  Non-toxic appearance. No distress.  Afebrile, nontoxic, NAD  HENT:  Head: Normocephalic and atraumatic.  Mouth/Throat: Oropharynx is clear and moist and mucous membranes are normal.  Eyes: Conjunctivae and EOM are normal. Right eye exhibits no discharge. Left eye exhibits no discharge.  Neck: Normal range of motion. Neck supple.  Cardiovascular: Normal rate, regular rhythm, normal heart sounds and intact distal pulses.  Exam reveals no gallop and no friction rub.   No murmur heard. Pulmonary/Chest: Effort normal and breath sounds normal. No respiratory distress. He has no decreased breath sounds. He has no wheezes. He has no rhonchi. He has no rales.  Abdominal: Soft. Normal appearance and bowel sounds are normal. He exhibits no distension. There is tenderness in the right lower quadrant. There is guarding and tenderness at McBurney's point. There is no rigidity, no rebound, no CVA tenderness and negative Murphy's sign.    Soft, nondistended, +BS throughout, with RLQ TTP with mild guarding when palpated near mcburney's point, no rebound or rigidity, neg foot tap test, neg psoas sign, neg murphy's, no CVA TTP   Musculoskeletal: Normal range of motion.  Neurological: He is alert and oriented to person, place, and time. He has normal strength. No sensory deficit.  Skin: Skin is warm, dry and intact. No rash noted.  Psychiatric: He has a normal mood and affect.  Nursing note and vitals reviewed.   ED Course  Procedures (including critical care time) Labs Review Labs Reviewed  COMPREHENSIVE METABOLIC PANEL - Abnormal; Notable for the following:    AST 44 (*)     Total Bilirubin 1.8 (*)    All other components within normal limits  CBC - Abnormal; Notable for the following:    Platelets 33 (*)    All other components within normal limits  URINALYSIS, ROUTINE W REFLEX MICROSCOPIC (NOT AT [facility]) - Abnormal; Notable for the following:    Color, Urine AMBER (*)    APPearance HAZY (*)    Bilirubin Urine SMALL (*)    Ketones, ur 15 (*)    Protein, ur 30 (*)    Leukocytes, UA TRACE (*)    All other components within normal limits  URINE MICROSCOPIC-ADD ON - Abnormal; Notable for the following:    Casts HYALINE CASTS (*)    All other components within normal limits  LIPASE, BLOOD  I-STAT TROPOININ, ED  I-STAT TROPOININ, ED    Imaging Review Ct Abdomen Pelvis W Contrast  11/25/2014   CLINICAL DATA:  Right lower quadrant abdominal pain. Nausea and vomiting.  EXAM: CT ABDOMEN AND PELVIS WITH CONTRAST  TECHNIQUE: Multidetector CT imaging of the abdomen and pelvis was performed using the standard protocol following bolus administration of intravenous contrast.  CONTRAST:  123m OMNIPAQUE IOHEXOL 300 MG/ML  SOLN  COMPARISON:  Chest x-ray 04/10/2013.  FINDINGS: Chronic scarring in calcification is again noted at the left lung base. The lungs are otherwise clear. The heart size is normal.  The mediastinum is shifted to the left.  The liver is within normal limits. The spleen is markedly enlarged the liver is somewhat nodular and hypodense suggesting cirrhosis. Portal venous hypertension is present. The stomach, duodenum, and pancreas are otherwise within normal limits. The common bile duct and gallbladder are unremarkable. The adrenal glands are within normal limits bilaterally. There is some mass effect on the left kidney from the enlarged spleen. The ureters and urinary bladder are within normal limits. Small gastric varices are noted. The paraumbilical vein is recanalized.  The rectosigmoid colon is within normal limits. The remainder the colon is unremarkable.  The cecum terminates above the level of the iliac crest. The ileocecal valve is within normal limits. At least 1 small lymph node is present within the ileocolic ligament.  Bone windows are unremarkable.  IMPRESSION: 1. Marked splenomegaly likely related to portal venous hypertension. 2. 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 3. Findings suggestive of cirrhosis. 4. No acute abnormality to explain right lower quadrant pain. 5. Scarring at the left lung base.   Electronically Signed   By: San Morelle M.D.   On: 11/25/2014 15:48   I have personally reviewed and evaluated these images and lab results as part of my medical decision-making.   EKG Interpretation None      MDM   Final diagnoses:  Right lower quadrant abdominal pain  Non-intractable vomiting with nausea, vomiting of unspecified type  Thrombocytopenia  Elevated bilirubin  Splenomegaly    44 y.o. male here with abd pain, n/v after eating fish/veggie soup this morning. Felt dizzy when he vomited earlier but this resolved. Tenderness in RLQ with some guarding, neg murphys. Lipase WNL. CMP showing mildly elevated bili at 1.8, AST 44, ALT and alk phos WNL. CBC unremarkable aside from platelets 33 (unclear if this is baseline for pt or not). Unclear etiology, will obtain CT imaging to r/o appendicitis vs hepatic etiology vs other etiology. Will give pain meds, fluids, and nausea meds, and reassess shortly.   3:50 PM U/A with small bili, few ketones and protein which could indicate dehydration, no evidence of bacteria. Trop neg. EKG NSR with early repol but no ischemic changes. Awaiting CT abd/pelvis. Pt feels improved, no ongoing N/V or abd pain. Will reassess after CT.  3:59 PM CT imaging showing marked splenomegaly with portal venous hypertension. Upon questioning again, pt states he has had some bleeding gums when he brushes his teeth. Again asked if he  consumes alcohol and he denies this, and also denies any other medical history although his daughter [place]. Will consult for admission to further investigate his grossly enlarged spleen.   4:20 PM Dr. ZCoralyn Pearreturning page and will admit to med-surg. Please see his notes for further documentation of care.   BP 113/47 mmHg  Pulse 76  Temp(Src) 97.9 F (36.6 C) (Oral)  Resp 17  SpO2 99%  Meds ordered this encounter  Medications  . sodium chloride 0.9 % bolus 1,000 mL    Sig:   . DISCONTD: morphine 4 MG/ML injection 4 mg    Sig:   . ondansetron (ZOFRAN) injection 4 mg    Sig:   . fentaNYL (SUBLIMAZE) injection 50 mcg    Sig:   . iohexol (OMNIPAQUE) 300 MG/ML solution 25 mL    Sig:   . iohexol (OMNIPAQUE) 300 MG/ML  solution 100 mL    Sig:      Raahim Shartzer Camprubi-Soms, PA-C 11/25/14 [place], MD 11/26/14 [phone]

## 2014-11-25 NOTE — H&P (Signed)
[facility] History and Physical  Jose Snow FBP:[id number] DOB: [birth year] DOA: 11/25/2014  Referring physician:  PCP: No PCP Per Patient   Chief Complaint: Adominal pain, nausea and vomiting  HPI: Jose Snow is a 44 y.o. male with a past medical history of latent tuberculosis status post treatment, who presents to the emergency department with complaints of abdominal pain associate with multiple episodes of nausea and vomiting. He reported being in his usual state health until this morning where he developed multiple episodes of nonbloody emesis after having his breakfast. He reported having right upper quadrant abdominal pain as well, characterized as achy, nonradiating. He denies diarrhea, constipation, bloody stools, black tarry stools, hematemesis. He reports unable to hold down by mouth for which he presented to the emergency department. He was worked up with a CT scan of abdomen and pelvis that revealed findings consistent with cirrhosis along with market splenomegaly, portal venous hypertension, gastric varices. Labs revealed a platelet count of 33,000 with total bilirubin 1.8. Patient denies a history of alcohol abuse. He is originally from [place]. Denies history of hepatitis, drug abuse, GI bleeding, or changes in skin color.                                                                            Review of Systems:  Constitutional:  No weight loss, night sweats, Fevers, chills, fatigue.  HEENT:  No headaches, Difficulty swallowing,Tooth/dental problems,Sore throat,  No sneezing, itching, ear ache, nasal congestion, post nasal drip,  Cardio-vascular:  No chest pain, Orthopnea, PND, swelling in lower extremities, anasarca, dizziness, palpitations  GI:  No heartburn, indigestion, positive for right upper quadraqnt abdominal pain, nausea, vomiting,  denies diarrhea, change in bowel habits, loss of appetite  Resp:  No shortness of breath with exertion or at rest. No  excess mucus, no productive cough, No non-productive cough, No coughing up of blood.No change in color of mucus.No wheezing.No chest wall deformity  Skin:  no rash or lesions.  GU:  no dysuria, change in color of urine, no urgency or frequency. No flank pain.  Musculoskeletal:  No joint pain or swelling. No decreased range of motion. No back pain.  Psych:  No change in mood or affect. No depression or anxiety. No memory loss.   Past Medical History  Diagnosis Date  . 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    History reviewed. No pertinent past surgical history. Social History:  reports that he quit smoking about 24 years ago. His smoking use included Cigarettes. He has a 4 pack-year smoking history. He has never used smokeless tobacco. He reports that he does not drink alcohol or use illicit drugs.  No Known Allergies  No family history on file. Patient denies family history of liver disease  Prior to Admission medications   Medication Sig Start Date End Date Taking? Authorizing Provider  albuterol (PROVENTIL HFA;VENTOLIN HFA) 108 (90 BASE) MCG/ACT inhaler Inhale 2 puffs into the lungs every 4 (four) hours as needed for wheezing or shortness of breath. Patient not taking: Reported on 11/25/2014 04/10/13   Janne Napoleon, NP   Physical Exam: Filed Vitals:   11/25/14 1430 11/25/14 1445 11/25/14 1600  11/25/14 1615  BP: 117/57 108/60 113/47 114/58  Pulse: 78 79 76 75  Temp:      TempSrc:      Resp: 19 17    SpO2: 100% 98% 99% 97%    Wt Readings from Last 3 Encounters:  07/16/10 54.976 kg (121 lb 3.2 oz)  01/05/08 54.885 kg (121 lb)  11/02/07 59.421 kg (131 lb)    General:  Appears calm and comfortable, thin appearing, no acute distress he is awake and alert oriented 3  Eyes: PERRL, normal lids, irises & conjunctiva ENT: grossly normal hearing, lips & tongue Neck: no LAD, masses or thyromegaly Cardiovascular: RRR, no m/r/g. No LE edema. Telemetry: SR, no  arrhythmias  Respiratory: CTA bilaterally, no w/r/r. Normal respiratory effort. Abdomen: soft, ntnd, having positive bowel sounds to all 4 quadrants, there is no significant tenderness to palpation nor were there any palpable masses.  Skin: no rash or induration seen on limited exam Musculoskeletal: grossly normal tone BUE/BLE Psychiatric: grossly normal mood and affect, speech fluent and appropriate Neurologic: grossly non-focal.          Labs on Admission:  Basic Metabolic Panel:  Recent Labs Lab 11/25/14 1215  NA 140  K 3.8  CL 106  CO2 22  GLUCOSE 76  BUN 8  CREATININE 1.22  CALCIUM 9.0   Liver Function Tests:  Recent Labs Lab 11/25/14 1215  AST 44*  ALT 30  ALKPHOS 67  BILITOT 1.8*  PROT 7.2  ALBUMIN 4.1    Recent Labs Lab 11/25/14 1215  LIPASE 45   No results for input(s): AMMONIA in the last 168 hours. CBC:  Recent Labs Lab 11/25/14 1215  WBC 4.4  HGB 15.2  HCT 44.2  MCV 89.8  PLT 33*   Cardiac Enzymes: No results for input(s): CKTOTAL, CKMB, CKMBINDEX, TROPONINI in the last 168 hours.  BNP (last 3 results) No results for input(s): BNP in the last 8760 hours.  ProBNP (last 3 results) No results for input(s): PROBNP in the last 8760 hours.  CBG: No results for input(s): GLUCAP in the last 168 hours.  Radiological Exams on Admission: Ct Abdomen Pelvis W Contrast  11/25/2014   CLINICAL DATA:  Right lower quadrant abdominal pain. Nausea and vomiting.  EXAM: CT ABDOMEN AND PELVIS WITH CONTRAST  TECHNIQUE: Multidetector CT imaging of the abdomen and pelvis was performed using the standard protocol following bolus administration of intravenous contrast.  CONTRAST:  160m OMNIPAQUE IOHEXOL 300 MG/ML  SOLN  COMPARISON:  Chest x-ray 04/10/2013.  FINDINGS: Chronic scarring in calcification is again noted at the left lung base. The lungs are otherwise clear. The heart size is normal. The mediastinum is shifted to the left.  The liver is within normal  limits. The spleen is markedly enlarged the liver is somewhat nodular and hypodense suggesting cirrhosis. Portal venous hypertension is present. The stomach, duodenum, and pancreas are otherwise within normal limits. The common bile duct and gallbladder are unremarkable. The adrenal glands are within normal limits bilaterally. There is some mass effect on the left kidney from the enlarged spleen. The ureters and urinary bladder are within normal limits. Small gastric varices are noted. The paraumbilical vein is recanalized.  The rectosigmoid colon is within normal limits. The remainder the colon is unremarkable. The cecum terminates above the level of the iliac crest. The ileocecal valve is within normal limits. At least 1 small lymph node is present within the ileocolic ligament.  Bone windows are unremarkable.  IMPRESSION: 1. Marked  splenomegaly likely related to portal venous hypertension. 2. 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 3. Findings suggestive of cirrhosis. 4. No acute abnormality to explain right lower quadrant pain. 5. Scarring at the left lung base.   Electronically Signed   By: San Morelle M.D.   On: 11/25/2014 15:48    EKG: Independently reviewed.   Assessment/Plan Principal Problem:   Cirrhosis Active Problems:   Thrombocytopenia   Abdominal pain   1. Cirrhosis. Patient having only past medical history of latent tuberculosis status post treatment, presenting with complaints of right upper quadrant abdominal pain associate with nausea and vomiting. Initial workup included a CT scan of abdomen and pelvis that revealed cirrhosis associate with esophageal varices, significant splenomegaly and CBC showing the presence of thrombocytopenia. Patient denies alcohol abuse or IVDU. Differential remains broad. Will check antinuclear antibodies and anti-smooth muscle antibodies for possible autoimmune hepatitis. Will  check alpha-1 antitrypsin levels along with ceruloplasmin levels for alpha-1 antitrypsin deficiency and Wilson's disease. Will also check iron panel and ferritin level for hemochromatosis. Being from [place] hepatitis B is a strong consideration. Have also ordered hepatitis B, C and E serologies and HIV antibody. I discussed case with Dr. Oletta Lamas of GI who will evaluate patient in AM.  2. Thrombocytopenia. Labs showing plate account of 14,782, likely secondary to cirrhosis. Holding pharmacologic DVT prophylaxis. He does not have evidence of active bleeding at this time. 3. Nausea and vomiting. Could be related to viral syndrome, will provide supportive care advance his diet as tolerated.    Code Status: Full code  DVT Prophylaxis:SCDs  Family Communication: I spoke to his wife was present at bedside  Disposition Plan: Will omit patient to the inpatient service, anticipate he may require greater than 2 nights hospitalization  Time spent: 70 minutes  Kelvin Cellar [facility] Pager [phone]

## 2014-11-26 DIAGNOSIS — R17 Unspecified jaundice: Secondary | ICD-10-CM | POA: Insufficient documentation

## 2014-11-26 DIAGNOSIS — R1031 Right lower quadrant pain: Secondary | ICD-10-CM

## 2014-11-26 DIAGNOSIS — K7460 Unspecified cirrhosis of liver: Secondary | ICD-10-CM

## 2014-11-26 DIAGNOSIS — R112 Nausea with vomiting, unspecified: Secondary | ICD-10-CM

## 2014-11-26 DIAGNOSIS — D696 Thrombocytopenia, unspecified: Secondary | ICD-10-CM

## 2014-11-26 LAB — CBC
HEMATOCRIT: 38.1 % — AB (ref 39.0–52.0)
Hemoglobin: 12.8 g/dL — ABNORMAL LOW (ref 13.0–17.0)
MCH: 30.3 pg (ref 26.0–34.0)
MCHC: 33.6 g/dL (ref 30.0–36.0)
MCV: 90.3 fL (ref 78.0–100.0)
Platelets: 30 10*3/uL — ABNORMAL LOW (ref 150–400)
RBC: 4.22 MIL/uL (ref 4.22–5.81)
RDW: 14.4 % (ref 11.5–15.5)
WBC: 2 10*3/uL — ABNORMAL LOW (ref 4.0–10.5)

## 2014-11-26 LAB — HIV ANTIBODY (ROUTINE TESTING W REFLEX): HIV Screen 4th Generation wRfx: NONREACTIVE

## 2014-11-26 LAB — COMPREHENSIVE METABOLIC PANEL
ALBUMIN: 3.1 g/dL — AB (ref 3.5–5.0)
ALK PHOS: 61 U/L (ref 38–126)
ALT: 32 U/L (ref 17–63)
AST: 41 U/L (ref 15–41)
Anion gap: 5 (ref 5–15)
BILIRUBIN TOTAL: 0.8 mg/dL (ref 0.3–1.2)
BUN: 13 mg/dL (ref 6–20)
CALCIUM: 8.2 mg/dL — AB (ref 8.9–10.3)
CO2: 29 mmol/L (ref 22–32)
Chloride: 105 mmol/L (ref 101–111)
Creatinine, Ser: 1.2 mg/dL (ref 0.61–1.24)
GFR calc Af Amer: >60 mL/min (ref >60)
GFR calc non Af Amer: >60 mL/min (ref >60)
GLUCOSE: 97 mg/dL (ref 65–99)
POTASSIUM: 4.1 mmol/L (ref 3.5–5.1)
Sodium: 139 mmol/L (ref 135–145)
TOTAL PROTEIN: 5.8 g/dL — AB (ref 6.5–8.1)

## 2014-11-26 LAB — ALPHA-1-ANTITRYPSIN: A-1 Antitrypsin, Ser: 145 mg/dL (ref 90–200)

## 2014-11-26 LAB — CERULOPLASMIN: Ceruloplasmin: 12.3 mg/dL — ABNORMAL LOW (ref 16.0–31.0)

## 2014-11-26 MED ORDER — PNEUMOCOCCAL VAC POLYVALENT 25 MCG/0.5ML IJ INJ: 0.5000 mL | INJECTION | INTRAMUSCULAR | Status: DC

## 2014-11-26 MED ORDER — OMEPRAZOLE 40 MG PO CPDR: 40.0000 mg | DELAYED_RELEASE_CAPSULE | Freq: Every day | ORAL | Status: DC

## 2014-11-26 NOTE — Discharge Summary (Signed)
Physician Discharge Summary  Jose Snow OPF:[id number] DOB: 1970-05-30 DOA: 11/25/2014  PCP: No PCP Per Patient  Admit date: 11/25/2014 Discharge date: 11/26/2014  Recommendations for Outpatient Follow-up:  1. Pt will need to follow up with PCP in 2 weeks post discharge 2. Please follow-up on viral hepatitis serologies and autoimmune serologies including ANA, ASMA Discharge Diagnoses:  Gastritis/nausea/vomiting -The patient likely had a degree of gastroenteritis -He was treated conservatively with intravenous fluids, antiemetics, and opiate was -His diet was advanced which she tolerated -He was seen by Dr. Laurence Spates who did not recommend any further inpatient testing as the patient was clinically improved. 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   Specialty:  Gastroenterology   Contact information:   [address] [place] [address] [phone]       Diet:soft Wt Readings from Last 3 Encounters:  07/16/10 54.976 kg (121 lb 3.2 oz)  01/05/08 54.885 kg (121 lb)  11/02/07 59.421 kg  (131 lb)    History of present illness:  44 year old male with a history of latent tuberculosis infection presented with 1 day history abdominal pain and associated nausea and vomiting. His abdominal pain and vomiting occurred after eating breakfast on 11/25/2014. Patient denied any fevers, chills, chest pain, shortness breath, dysuria, hematuria, medication, melena. CT of the abdomen and pelvis was performed and revealed a nodular liver with stigmata of hypertension including splenomegaly and gastric varices.  The patient was seen by gastroenterology who had recommended hepatitis serology, autoimmune serologies, and ceruloplasmin. At the time of discharge, his viral hepatitis serologies were still pending. Gastroenterology felt that the patient could be discharged with follow-up in the outpatient setting. The patient's diet was advanced which he tolerated. His abdominal pain improved. The patient was given a prescription for omeprazole 40 mg daily. The patient denied any hematemesis, hematochezia, melena.  Consultants: [facility]  Discharge Exam: Filed Vitals:   11/26/14 0608  BP: 96/46  Pulse: 56  Temp: 97.8 F (36.6 C)  Resp: 16   Filed Vitals:   11/25/14 1645 11/25/14 1734 11/25/14 2118 11/26/14 0608  BP: 105/78 105/52 113/48 96/46  Pulse: 72 67  56  Temp:  98.3 F (36.8 C) 98.2 F (36.8 C) 97.8 F (36.6 C)  TempSrc:  Oral Oral Oral  Resp:  15 18 16   SpO2: 99% 98% 98% 98%   General: A&O x 3, NAD, pleasant, cooperative Cardiovascular: RRR, no rub, no gallop, no S3 Respiratory: CTAB, no wheeze, no rhonchi Abdomen:soft, nontender, nondistended, positive bowel sounds Extremities: No edema, No lymphangitis, no petechiae  Discharge Instructions      Discharge Instructions    Diet - low sodium heart healthy    Complete by:  As directed      Increase activity  slowly    Complete by:  As directed             Medication List    STOP taking these medications         albuterol 108 (90 BASE) MCG/ACT inhaler  Commonly known as:  PROVENTIL HFA;VENTOLIN HFA      TAKE these medications        omeprazole 40 MG capsule  Commonly known as:  PRILOSEC  Take 1 capsule (40 mg total) by mouth daily.         The results of significant diagnostics from this hospitalization (including imaging, microbiology, ancillary and laboratory) are listed below for reference.    Significant Diagnostic Studies: Ct Abdomen Pelvis W Contrast  11/25/2014   CLINICAL DATA:  Right lower quadrant abdominal pain. Nausea and vomiting.  EXAM: CT ABDOMEN AND PELVIS WITH CONTRAST  TECHNIQUE: Multidetector CT imaging of the abdomen and pelvis was performed using the standard protocol following bolus administration of intravenous contrast.  CONTRAST:  147m OMNIPAQUE IOHEXOL 300 MG/ML  SOLN  COMPARISON:  Chest x-ray 04/10/2013.  FINDINGS: Chronic scarring in calcification is again noted at the left lung base. The lungs are otherwise clear. The heart size is normal. The mediastinum is shifted to the left.  The liver is within normal limits. The spleen is markedly enlarged the liver is somewhat nodular and hypodense suggesting cirrhosis. Portal venous hypertension is present. The stomach, duodenum, and pancreas are otherwise within normal limits. The common bile duct and gallbladder are unremarkable. The adrenal glands are within normal limits bilaterally. There is some mass effect on the left kidney from the enlarged spleen. The ureters and urinary bladder are within normal limits. Small gastric varices are noted. The paraumbilical vein is recanalized.  The rectosigmoid colon is within normal limits. The remainder the colon is unremarkable. The cecum terminates above the level of the iliac crest. The ileocecal valve is within normal limits. At least 1 small lymph node is present within the ileocolic ligament.  Bone windows are unremarkable.  IMPRESSION: 1. Marked splenomegaly likely related to portal  venous hypertension. 2. 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 3. Findings suggestive of cirrhosis. 4. No acute abnormality to explain right lower quadrant pain. 5. Scarring at the left lung base.   Electronically Signed   By: CSan MorelleM.D.   On: 11/25/2014 15:48     Microbiology: No results found for this or any previous visit (from the past 240 hour(s)).   Labs: Basic Metabolic Panel:  Recent Labs Lab 11/25/14 1215 11/26/14 0728  NA 140 139  K 3.8 4.1  CL 106 105  CO2 22 29  GLUCOSE 76 97  BUN 8 13  CREATININE 1.22 1.20  CALCIUM 9.0 8.2*   Liver Function Tests:  Recent Labs Lab 11/25/14 1215 11/26/14 0728  AST 44* 41  ALT 30 32  ALKPHOS 67 61  BILITOT 1.8* 0.8  PROT 7.2 5.8*  ALBUMIN 4.1 3.1*    Recent Labs Lab 11/25/14 1215  LIPASE 45   No results for input(s): AMMONIA in the last 168 hours. CBC:  Recent Labs Lab 11/25/14 1215 11/26/14 0728  WBC 4.4 2.0*  HGB 15.2 12.8*  HCT 44.2 38.1*  MCV 89.8 90.3  PLT 33* 30*   Cardiac Enzymes: No results for input(s): CKTOTAL, CKMB, CKMBINDEX, TROPONINI in the last 168 hours. BNP: Invalid input(s): POCBNP CBG: No results for input(s): GLUCAP in the last 168  hours.  Time coordinating discharge:  Greater than 30 minutes  Signed:  Aaryan Essman, DO [facility] Pager: [phone] 11/26/2014, 2:37 PM

## 2014-11-26 NOTE — Consult Note (Signed)
[facility] CON[facility] Reason for consult: cirrhosis on CT scan. Referring Physician: [facility]. PCP: urgent care center on [address] Primary G.I.: none patient unassigned  Jose Snow is an 44 y.o. male.  HPI: he is originally from [place] and has been in the [place] for approximately 14 years. He speaks [place] well. He came into the emergency room with abdominal pain and nausea and vomiting that came on acutely. He felt well a breakfast and then became ill. He had a CT scan in the emergency room that reveal Mark splenomegaly, portal hypertension and shrunken liver consistent with cirrhosis with the presence of both esophageal and gastric varices. The patient has had no prior history of cirrhosis. He is unaware of any past history of liver disease. He did have a history of TB and this was treated in the past. He does not drink alcohol. Labs are remarkable for albumin 4.1 total bilirubin 1.8 sensually normal transaminases. Platelet count low 33. PT 18.5. Patient feels much better today and a good breakfast with no symptoms. He notes that his abdominal pain has resolve completely. Essentially all labs for workup of abnormal liver test are still pending at this time.  Past Medical History  Diagnosis Date  . 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     History reviewed. No pertinent past surgical history.  No family history on file.  Social History:  reports that he quit smoking about 24 years ago. His smoking use included Cigarettes. He has a 4 pack-year smoking history. He has never used smokeless tobacco. He reports that he does not drink alcohol or use illicit drugs.  Allergies: No Known Allergies  Medications; Prior to Admission medications   Medication Sig Start Date End Date Taking? Authorizing Provider  albuterol (PROVENTIL HFA;VENTOLIN HFA) 108 (90 BASE) MCG/ACT inhaler Inhale 2 puffs into the lungs every 4 (four) hours as needed for wheezing or  shortness of breath. Patient not taking: Reported on 11/25/2014 04/10/13   Janne Napoleon, NP   . Derrill Memo ON 11/27/2014] pneumococcal 23 valent vaccine  0.5 mL Intramuscular Tomorrow-1000  . sodium chloride  3 mL Intravenous Q12H   PRN Meds sodium chloride, albuterol, alum & mag hydroxide-simeth, ondansetron **OR** ondansetron (ZOFRAN) IV, oxyCODONE, sodium chloride Results for orders placed or performed during the hospital encounter of 11/25/14 (from the past 48 hour(s))  Lipase, blood     Status: None   Collection Time: 11/25/14 12:15 PM  Result Value Ref Range   Lipase 45 22 - 51 U/L  Comprehensive metabolic panel     Status: Abnormal   Collection Time: 11/25/14 12:15 PM  Result Value Ref Range   Sodium 140 135 - 145 mmol/L   Potassium 3.8 3.5 - 5.1 mmol/L   Chloride 106 101 - 111 mmol/L   CO2 22 22 - 32 mmol/L   Glucose, Bld 76 65 - 99 mg/dL   BUN 8 6 - 20 mg/dL   Creatinine, Ser 1.22 0.61 - 1.24 mg/dL   Calcium 9.0 8.9 - 10.3 mg/dL   Total Protein 7.2 6.5 - 8.1 g/dL   Albumin 4.1 3.5 - 5.0 g/dL   AST 44 (H) 15 - 41 U/L   ALT 30 17 - 63 U/L   Alkaline Phosphatase 67 38 - 126 U/L   Total Bilirubin 1.8 (H) 0.3 - 1.2 mg/dL   GFR calc non Af Amer >60 >60 mL/min   GFR calc Af Amer >60 >60 mL/min  Comment: (NOTE) The eGFR has been calculated using the CKD EPI equation. This calculation has not been validated in all clinical situations. eGFR's persistently <60 mL/min signify possible Chronic Kidney Disease.    Anion gap 12 5 - 15  CBC     Status: Abnormal   Collection Time: 11/25/14 12:15 PM  Result Value Ref Range   WBC 4.4 4.0 - 10.5 K/uL   RBC 4.92 4.22 - 5.81 MIL/uL   Hemoglobin 15.2 13.0 - 17.0 g/dL   HCT 44.2 39.0 - 52.0 %   MCV 89.8 78.0 - 100.0 fL   MCH 30.9 26.0 - 34.0 pg   MCHC 34.4 30.0 - 36.0 g/dL   RDW 14.4 11.5 - 15.5 %   Platelets 33 (L) 150 - 400 K/uL    Comment: REPEATED TO VERIFY SPECIMEN CHECKED FOR CLOTS PLATELET COUNT CONFIRMED BY SMEAR   I-stat  troponin, ED     Status: None   Collection Time: 11/25/14  1:43 PM  Result Value Ref Range   Troponin i, poc 0.00 0.00 - 0.08 ng/mL   Comment 3            Comment: Due to the release kinetics of cTnI, a negative result within the first hours of the onset of symptoms does not rule out myocardial infarction with certainty. If myocardial infarction is still suspected, repeat the test at appropriate intervals.   Urinalysis, Routine w reflex microscopic (not at [facility])     Status: Abnormal   Collection Time: 11/25/14  2:40 PM  Result Value Ref Range   Color, Urine AMBER (A) YELLOW    Comment: BIOCHEMICALS MAY BE AFFECTED BY COLOR   APPearance HAZY (A) CLEAR   Specific Gravity, Urine 1.026 1.005 - 1.030   pH 6.0 5.0 - 8.0   Glucose, UA NEGATIVE NEGATIVE mg/dL   Hgb urine dipstick NEGATIVE NEGATIVE   Bilirubin Urine SMALL (A) NEGATIVE   Ketones, ur 15 (A) NEGATIVE mg/dL   Protein, ur 30 (A) NEGATIVE mg/dL   Urobilinogen, UA 1.0 0.0 - 1.0 mg/dL   Nitrite NEGATIVE NEGATIVE   Leukocytes, UA TRACE (A) NEGATIVE  Urine microscopic-add on     Status: Abnormal   Collection Time: 11/25/14  2:40 PM  Result Value Ref Range   Squamous Epithelial / LPF RARE RARE   WBC, UA 0-2 <3 WBC/hpf   Bacteria, UA RARE RARE   Casts HYALINE CASTS (A) NEGATIVE   Urine-Other MUCOUS PRESENT   TSH     Status: None   Collection Time: 11/25/14  6:55 PM  Result Value Ref Range   TSH 0.700 0.350 - 4.500 uIU/mL  Protime-INR     Status: Abnormal   Collection Time: 11/25/14  6:55 PM  Result Value Ref Range   Prothrombin Time 18.5 (H) 11.6 - 15.2 seconds   INR 1.53 (H) 0.00 - 1.49  Iron and TIBC     Status: Abnormal   Collection Time: 11/25/14  6:55 PM  Result Value Ref Range   Iron 31 (L) 45 - 182 ug/dL   TIBC 230 (L) 250 - 450 ug/dL   Saturation Ratios 14 (L) 17.9 - 39.5 %   UIBC 199 ug/dL  Ferritin     Status: Abnormal   Collection Time: 11/25/14  6:55 PM  Result Value Ref Range   Ferritin 340 (H) 24 - 336  ng/mL  Alpha-1-antitrypsin     Status: None   Collection Time: 11/25/14  6:55 PM  Result Value Ref Range  A-1 Antitrypsin, Ser 145 90 - 200 mg/dL    Comment: (NOTE) Performed At: [facility] [place], [place] [id number] Lindon Romp MD ZJ:[id number]   Ceruloplasmin     Status: Abnormal   Collection Time: 11/25/14  6:55 PM  Result Value Ref Range   Ceruloplasmin 12.3 (L) 16.0 - 31.0 mg/dL    Comment: (NOTE) Performed At: [facility] [place], [place] [id number] Lindon Romp MD ID:[id number]   HIV antibody     Status: None   Collection Time: 11/25/14  6:55 PM  Result Value Ref Range   HIV Screen 4th Generation wRfx Non Reactive Non Reactive    Comment: (NOTE) Performed At: [facility] [place], [place] [id number] Lindon Romp MD QP:[id number]   Comprehensive metabolic panel     Status: Abnormal   Collection Time: 11/26/14  7:28 AM  Result Value Ref Range   Sodium 139 135 - 145 mmol/L   Potassium 4.1 3.5 - 5.1 mmol/L   Chloride 105 101 - 111 mmol/L   CO2 29 22 - 32 mmol/L   Glucose, Bld 97 65 - 99 mg/dL   BUN 13 6 - 20 mg/dL   Creatinine, Ser 1.20 0.61 - 1.24 mg/dL   Calcium 8.2 (L) 8.9 - 10.3 mg/dL   Total Protein 5.8 (L) 6.5 - 8.1 g/dL   Albumin 3.1 (L) 3.5 - 5.0 g/dL   AST 41 15 - 41 U/L   ALT 32 17 - 63 U/L   Alkaline Phosphatase 61 38 - 126 U/L   Total Bilirubin 0.8 0.3 - 1.2 mg/dL   GFR calc non Af Amer >60 >60 mL/min   GFR calc Af Amer >60 >60 mL/min    Comment: (NOTE) The eGFR has been calculated using the CKD EPI equation. This calculation has not been validated in all clinical situations. eGFR's persistently <60 mL/min signify possible Chronic Kidney Disease.    Anion gap 5 5 - 15  CBC     Status: Abnormal   Collection Time: 11/26/14  7:28 AM  Result Value Ref Range   WBC 2.0 (L) 4.0 - 10.5 K/uL   RBC 4.22 4.22 - 5.81 MIL/uL   Hemoglobin 12.8 (L) 13.0 - 17.0 g/dL   HCT  38.1 (L) 39.0 - 52.0 %   MCV 90.3 78.0 - 100.0 fL   MCH 30.3 26.0 - 34.0 pg   MCHC 33.6 30.0 - 36.0 g/dL   RDW 14.4 11.5 - 15.5 %   Platelets 30 (L) 150 - 400 K/uL    Comment: CONSISTENT WITH PREVIOUS RESULT    Ct Abdomen Pelvis W Contrast  11/25/2014   CLINICAL DATA:  Right lower quadrant abdominal pain. Nausea and vomiting.  EXAM: CT ABDOMEN AND PELVIS WITH CONTRAST  TECHNIQUE: Multidetector CT imaging of the abdomen and pelvis was performed using the standard protocol following bolus administration of intravenous contrast.  CONTRAST:  125m OMNIPAQUE IOHEXOL 300 MG/ML  SOLN  COMPARISON:  Chest x-ray 04/10/2013.  FINDINGS: Chronic scarring in calcification is again noted at the left lung base. The lungs are otherwise clear. The heart size is normal. The mediastinum is shifted to the left.  The liver is within normal limits. The spleen is markedly enlarged the liver is somewhat nodular and hypodense suggesting cirrhosis. Portal venous hypertension is present. The stomach, duodenum, and pancreas are otherwise within normal limits. The common bile duct and gallbladder are unremarkable. The adrenal glands are within normal limits bilaterally.  There is some mass effect on the left kidney from the enlarged spleen. The ureters and urinary bladder are within normal limits. Small gastric varices are noted. The paraumbilical vein is recanalized.  The rectosigmoid colon is within normal limits. The remainder the colon is unremarkable. The cecum terminates above the level of the iliac crest. The ileocecal valve is within normal limits. At least 1 small lymph node is present within the ileocolic ligament.  Bone windows are unremarkable.  IMPRESSION: 1. Marked splenomegaly likely related to portal venous hypertension. 2. 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 3. Findings suggestive of cirrhosis. 4. No acute abnormality to explain right lower  quadrant pain. 5. Scarring at the left lung base.   Electronically Signed   By: San Morelle M.D.   On: 11/25/2014 15:48               Blood pressure 96/46, pulse 56, temperature 97.8 F (36.6 C), temperature source Oral, resp. rate 16, SpO2 98 %.  Physical exam:   General-- pleasant Asian mail in no acute distress ENT-- nonicteric Neck-- no lymphadenopathy Heart-- regular rate and rhythm without murmurs or gallops Lungs-- clear Abdomen-- nondistended soft and nontender no demonstrable ascites Psych-- alert and oriented, appropriate   Assessment: 1. Cirrhosis. This is by CT criteria with splenomegaly, thrombocytopenia etc. His PT is 18 and there is no ascites and albumin remains normal. This clearly is a chronic problem in more than likely a result of chronic viral infection. Results are still pending at this time 2. Abdominal pain/nausea and vomiting. This is resolved and could well have been gastroenteritis.  Plan: suspect that this is a result of chronic hepatitis B but those results are still pending. The patients doing better and could probably be discharged with follow-up as an outpatient. We will be available for any acute problems here in the hospital.   Salley Boxley JR,Shatara Stanek L 11/26/2014, 9:13 AM   Pager: [phone] If no answer or after hours call [phone]

## 2014-11-27 LAB — HEPATITIS B SURFACE ANTIBODY,QUALITATIVE: Hep B S Ab: NONREACTIVE

## 2014-11-27 LAB — HEPATITIS B CORE ANTIBODY, TOTAL: Hep B Core Total Ab: POSITIVE — AB

## 2014-11-27 LAB — HEPATITIS B CORE ANTIBODY, IGM: Hep B C IgM: NEGATIVE

## 2014-11-27 LAB — HEPATITIS B E ANTIGEN: Hep B E Ag: POSITIVE — AB

## 2014-11-27 LAB — ANTI-SMOOTH MUSCLE ANTIBODY, IGG: F-ACTIN AB IGG: 15 U (ref 0–19)

## 2014-11-27 LAB — HEPATITIS C ANTIBODY: HCV Ab: <0.1 s/co ratio (ref 0.0–0.9)

## 2014-11-28 LAB — HEPATITIS B E ANTIBODY: Hep B E Ab: NEGATIVE

## 2014-11-28 LAB — HEPATITIS B SURFACE ANTIGEN

## 2014-11-28 LAB — HEPATITIS B SURFACE AG, CONFIRM: HBsAg Confirmation: POSITIVE — AB

## 2014-11-28 LAB — ANTINUCLEAR ANTIBODIES, IFA: ANTINUCLEAR ANTIBODIES, IFA: NEGATIVE

## 2014-12-01 LAB — HEPATITIS E ANTIBODY, IGG

## 2014-12-01 LAB — HEPATITIS E ANTIBODY, IGM

## 2014-12-07 ENCOUNTER — Ambulatory Visit (INDEPENDENT_AMBULATORY_CARE_PROVIDER_SITE_OTHER): Payer: 59 | Admitting: Family Medicine

## 2014-12-07 VITALS — BP 100/62 | HR 69 | Temp 98.4°F | Resp 18 | Ht 63.0 in | Wt 125.0 lb

## 2014-12-07 DIAGNOSIS — B181 Chronic viral hepatitis B without delta-agent: Secondary | ICD-10-CM

## 2014-12-07 DIAGNOSIS — D696 Thrombocytopenia, unspecified: Secondary | ICD-10-CM | POA: Diagnosis not present

## 2014-12-07 LAB — POCT CBC
Granulocyte percent: 53.4 %G (ref 37–80)
HCT, POC: 41.5 % — AB (ref 43.5–53.7)
HEMOGLOBIN: 13.2 g/dL — AB (ref 14.1–18.1)
LYMPH, POC: 0.7 (ref 0.6–3.4)
MCH, POC: 29 pg (ref 27–31.2)
MCHC: 31.9 g/dL (ref 31.8–35.4)
MCV: 91 fL (ref 80–97)
MID (cbc): 0.2 (ref 0–0.9)
MPV: 6 fL (ref 0–99.8)
PLATELET COUNT, POC: 27 10*3/uL — AB (ref 142–424)
POC Granulocyte: 1 — AB (ref 2–6.9)
POC LYMPH PERCENT: 36.2 %L (ref 10–50)
POC MID %: 10.4 % (ref 0–12)
RBC: 4.56 M/uL — AB (ref 4.69–6.13)
RDW, POC: 16 %
WBC: 1.9 10*3/uL — AB (ref 4.6–10.2)

## 2014-12-07 NOTE — Patient Instructions (Signed)
You will see Dr. Oletta Lamas with Gastroenteorology on Wednesday 01/04/15 at 3:30 pm I am also going to refer you to see hematology to discuss your blood counts If you start having any bleeding- nosebleeds, bruising, etc please seek care right away! I am not sure what treatment you will need for your hepatitis B so I will defer this to your gastroenterologist.    Let us know if you have any other concerns

## 2014-12-07 NOTE — Progress Notes (Signed)
[facility] [address], [place] [address] [phone]  Date:  12/07/2014   Name:  Jose Snow   DOB:  May 27, 1970   MRN:  [id number]  PCP:  No PCP Per Patient    Chief Complaint: Hospitalization Follow-up and ref to gastro   History of Present Illness:  Leanord Thibeau is a 44 y.o. very pleasant male patient who presents with the following:  Here today as a new patient.  He was admitted to the hospital overnight from 8/19 to 8/20 with gastroententeritis.   He was also noted to have liver cirrhosis and newly dx Hep B- likely chronic Hep B as he is from [place].   He is to follow-up with Laurence Spates with [facility] within a couple of weeks. However when he went to their clinic he was told that he needed a referral from a PCP so he came to see [place].   LFTs looked ok, negative for HIV.  Also noted to have significant thrombocytopenia- maybe due to splenomegaly.  Platelets of 30k at discharge home He denies any bleeding- no nosebleeds, easy bruising, blood in stool or urine He states that he is feeling tired when out in the heat but otherwise he feels well No more nausea or vomiting- eating normally  He emigrated from [place] in 2001- at that time he had TB and was treated in the hospital.    He does speak some English and seems to understand most, but he is here today with a friend who helps with interpretation Wt Readings from Last 3 Encounters:  12/07/14 125 lb (56.7 kg)  07/16/10 121 lb 3.2 oz (54.976 kg)  01/05/08 121 lb (54.885 kg)     Patient Active Problem List   Diagnosis Date Noted  . Elevated bilirubin   . Nausea with vomiting   . Right lower quadrant abdominal pain   . Cirrhosis 11/25/2014  . Thrombocytopenia 11/25/2014  . Abdominal pain 11/25/2014  . Tuberculosis exposure 07/16/2010  . GASTROESOPHAGEAL REFLUX DISEASE, MILD 01/05/2008  . BACK PAIN, LUMBAR 11/02/2007    Past Medical History  Diagnosis Date  . 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     History reviewed. No pertinent past surgical history.  Social History  Substance Use Topics  . Smoking status: Former Smoker -- 0.50 packs/day for 8 years    Types: Cigarettes    Quit date: 04/08/1990  . Smokeless tobacco: Never Used  . Alcohol Use: No    History reviewed. No pertinent family history.  No Known Allergies  Medication list has been reviewed and updated.  Current Outpatient Prescriptions on File Prior to Visit  Medication Sig Dispense Refill  . omeprazole (PRILOSEC) 40 MG capsule Take 1 capsule (40 mg total) by mouth daily. 30 capsule 1   No current facility-administered medications on file prior to visit.    Review of Systems:  As per HPI- otherwise negative.   Physical Examination: Filed Vitals:   12/07/14 1527  BP: 100/62  Pulse: 69  Temp: 98.4 F (36.9 C)  Resp: 18   Filed Vitals:   12/07/14 1527  Height: 5' 3"  (1.6 m)  Weight: 125 lb (56.7 kg)   Body mass index is 22.15 kg/(m^2). Ideal Body Weight: Weight in (lb) to have BMI = 25: 140.8  GEN: WDWN, NAD, Non-toxic, A & O x 3, looks well HEENT: Atraumatic, Normocephalic. Neck supple. No masses, No LAD. Ears and Nose: No external  deformity. CV: RRR, No M/G/R. No JVD. No thrill. No extra heart sounds. PULM: CTA B, no wheezes, crackles, rhonchi. No retractions. No resp. distress. No accessory muscle use. ABD: S, NT, ND. No rebound. No HSM.  No palpable hepatomegaly EXTR: No c/c/e NEURO Normal gait.  PSYCH: Normally interactive. Conversant. Not depressed or anxious appearing.  Calm demeanor.   Received his CBC-  Results for orders placed or performed in visit on 12/07/14  POCT CBC  Result Value Ref Range   WBC 1.9 (A) 4.6 - 10.2 K/uL   Lymph, poc 0.7 0.6 - 3.4   POC LYMPH PERCENT 36.2 10 - 50 %L   MID (cbc) 0.2 0 - 0.9   POC MID % 10.4 0 - 12 %M   POC Granulocyte 1.0 (A) 2 - 6.9   Granulocyte percent 53.4 37 - 80 %G   RBC 4.56 (A) 4.69 - 6.13 M/uL    Hemoglobin 13.2 (A) 14.1 - 18.1 g/dL   HCT, POC 41.5 (A) 43.5 - 53.7 %   MCV 91.0 80 - 97 fL   MCH, POC 29.0 27 - 31.2 pg   MCHC 31.9 31.8 - 35.4 g/dL   RDW, POC 16.0 %   Platelet Count, POC 27 (A) 142 - 424 K/uL   MPV 6.0 0 - 99.8 fL   Assessment and Plan: Chronic viral hepatitis B without delta agent and without coma - Plan: Ambulatory referral to Gastroenterology  Thrombocytopenia - Plan: POCT CBC, Save smear, Protime-INR, Ambulatory referral to Hematology  We were able to arrange a GI appt for about one month from now.  Per GI it is ok to wait until later in September for his follow-up appt Significant but stable thrombocytopenia.  No active bleeding. Check PT/INR and peripheral smear He is to report and bleeding right away Referral to hematology for follow-up   Signed Lamar Blinks, MD

## 2014-12-08 LAB — PROTIME-INR
INR: 1.43 (ref <1.50)
Prothrombin Time: 17.6 seconds — ABNORMAL HIGH (ref 11.6–15.2)

## 2014-12-21 ENCOUNTER — Telehealth: Payer: Self-pay | Admitting: Hematology

## 2014-12-21 ENCOUNTER — Encounter: Payer: Self-pay | Admitting: Family Medicine

## 2014-12-21 NOTE — Telephone Encounter (Signed)
NEW PATIENT APPT-S/W PATIENT DTR [facility] APPT FOR 09/27 @ 8:15 W/DR. KALE. [place] DX-THROMBOCYTOPENIA

## 2015-01-03 ENCOUNTER — Ambulatory Visit: Payer: 59 | Admitting: Hematology

## 2015-01-03 ENCOUNTER — Encounter: Payer: Self-pay | Admitting: Family Medicine

## 2015-01-03 ENCOUNTER — Other Ambulatory Visit: Payer: 59

## 2015-01-05 ENCOUNTER — Other Ambulatory Visit: Payer: Self-pay | Admitting: *Deleted

## 2015-01-05 NOTE — Progress Notes (Signed)
Missed new patient appointment. Jose Snow notified and will contact patient regarding appointment.

## 2015-02-17 ENCOUNTER — Encounter: Payer: Self-pay | Admitting: Family Medicine

## 2015-02-17 DIAGNOSIS — B1910 Unspecified viral hepatitis B without hepatic coma: Secondary | ICD-10-CM | POA: Insufficient documentation

## 2015-12-14 ENCOUNTER — Emergency Department (HOSPITAL_COMMUNITY): Payer: BLUE CROSS/BLUE SHIELD

## 2015-12-14 ENCOUNTER — Emergency Department (HOSPITAL_COMMUNITY)
Admission: EM | Admit: 2015-12-14 | Discharge: 2015-12-15 | Disposition: A | Discharge status: Home / Self Care | Payer: BLUE CROSS/BLUE SHIELD | Attending: Emergency Medicine | Admitting: Emergency Medicine

## 2015-12-14 ENCOUNTER — Encounter (HOSPITAL_COMMUNITY): Payer: Self-pay | Admitting: Emergency Medicine

## 2015-12-14 DIAGNOSIS — K219 Gastro-esophageal reflux disease without esophagitis: Secondary | ICD-10-CM | POA: Insufficient documentation

## 2015-12-14 DIAGNOSIS — W28XXXA Contact with powered lawn mower, initial encounter: Secondary | ICD-10-CM | POA: Diagnosis not present

## 2015-12-14 DIAGNOSIS — Z87891 Personal history of nicotine dependence: Secondary | ICD-10-CM | POA: Insufficient documentation

## 2015-12-14 DIAGNOSIS — Z79899 Other long term (current) drug therapy: Secondary | ICD-10-CM | POA: Diagnosis not present

## 2015-12-14 DIAGNOSIS — S6991XA Unspecified injury of right wrist, hand and finger(s), initial encounter: Secondary | ICD-10-CM

## 2015-12-14 DIAGNOSIS — S68124A Partial traumatic metacarpophalangeal amputation of right ring finger, initial encounter: Secondary | ICD-10-CM | POA: Insufficient documentation

## 2015-12-14 DIAGNOSIS — Y93H2 Activity, gardening and landscaping: Secondary | ICD-10-CM | POA: Insufficient documentation

## 2015-12-14 MED ORDER — SODIUM CHLORIDE 0.9 % IV SOLN
INTRAVENOUS | Status: DC
  Administered 2015-12-14: via INTRAVENOUS

## 2015-12-14 MED ORDER — CEFAZOLIN SODIUM-DEXTROSE 2-4 GM/100ML-% IV SOLN
2.0000 g | Freq: Once | INTRAVENOUS | Status: AC
  Administered 2015-12-14: 2 g via INTRAVENOUS
  Filled 2015-12-14: qty 100

## 2015-12-14 NOTE — ED Provider Notes (Signed)
[facility] DEPT Provider Note   CSN: [id number] Arrival date & time: 12/14/15  1927  By signing my name below, I, Emmanuella Mensah, attest that this documentation has been prepared under the direction and in the presence of Debroah Baller, NP. Electronically Signed: Judithann Sauger, ED Scribe. 12/14/15. 11:04 PM.    History   Chief Complaint Chief Complaint  Patient presents with  . Finger Injury   HPI Comments: Jose Snow is a 45 y.o. male who presents to the Emergency Department complaining of an avulsion to his right ring finger s/p finger injury that occurred at 6:30 pm tonight. Pt explains that he sustained the injury while trying to remove grass from his lawn mover as he mowed the grass. Bleeding is currently being controlled by dressing applied at triage. No alleviating factors noted. Pt has not tried any medications PTA. He reports that he is right hand dominate. No fever, chills, or any generalized rash.   The history is provided by the patient. No language interpreter was used.    Past Medical History:  Diagnosis Date  . 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     Patient Active Problem List   Diagnosis Date Noted  . Hepatitis B 02/17/2015  . Elevated bilirubin   . Nausea with vomiting   . Right lower quadrant abdominal pain   . Cirrhosis ([place]) 11/25/2014  . Thrombocytopenia ([place]) 11/25/2014  . Abdominal pain 11/25/2014  . Tuberculosis exposure 07/16/2010  . GASTROESOPHAGEAL REFLUX DISEASE, MILD 01/05/2008  . BACK PAIN, LUMBAR 11/02/2007    History reviewed. No pertinent surgical history.     Home Medications    Prior to Admission medications   Medication Sig Start Date End Date Taking? Authorizing Provider  omeprazole (PRILOSEC) 40 MG capsule Take 1 capsule (40 mg total) by mouth daily. 11/26/14   Orson Eva, MD    Family History History reviewed. No pertinent family history.  Social History Social History  Substance Use Topics    . Smoking status: Former Smoker    Packs/day: 0.50    Years: 8.00    Types: Cigarettes    Quit date: 04/08/1990  . Smokeless tobacco: Never Used  . Alcohol use No     Allergies   Review of patient's allergies indicates no known allergies.   Review of Systems Review of Systems  Constitutional: Negative for chills and fever.  Gastrointestinal: Negative for vomiting.  Musculoskeletal: Positive for arthralgias.  Skin: Positive for wound. Negative for rash.  Neurological: Negative for syncope.     Physical Exam Updated Vital Signs BP 124/62   Pulse 84   Temp 98.4 F (36.9 C) (Oral)   Resp 18   Ht 5' 5"  (1.651 m)   Wt 59 kg   SpO2 96%   BMI 21.63 kg/m   Physical Exam  Constitutional: He is oriented to person, place, and time. He appears well-developed and well-nourished. No distress.  HENT:  Head: Normocephalic and atraumatic.  Eyes: EOM are normal.  Neck: Neck supple. No tracheal deviation present.  Cardiovascular: Normal rate.   Pulmonary/Chest: Effort normal. No respiratory distress.  Musculoskeletal:       Right hand: He exhibits tenderness and swelling.       Hands: Partial amputation to the tip of the ring finger. Radial pulse 2+.   Neurological: He is alert and oriented to person, place, and time.  Skin: Skin is warm and dry.  Psychiatric: He has a normal mood and affect. His  behavior is normal.  Nursing note and vitals reviewed.    ED Treatments / Results  DIAGNOSTIC STUDIES: Oxygen Saturation is 96% on RA, normal by my interpretation.    COORDINATION OF CARE: 10:41 PM- Pt advised of plan for treatment and pt agrees. Pt informed of his x-ray results. Consult to hand surgery.    Radiology Dg Finger Ring Right  Result Date: 12/14/2015 CLINICAL DATA:  Laceration of the tip of the right ring finger with a lawnmower blade. EXAM: RIGHT RING FINGER 2+V COMPARISON:  None. FINDINGS: Distal ring finger detail obscured by bandaging. There does appear to be a  soft tissue defect along the tip of the ring finger which may reflect amputation of the tip. No bony abnormality or foreign body besides the bandaging identified. IMPRESSION: 1. Amputation of the soft tissue tip of the right ring finger, with surrounding bandaging. No bony abnormality or unexpected foreign body. Electronically Signed   By: Van Clines M.D.   On: 12/14/2015 21:44    Procedures Procedures (including critical care time)  Medications Ordered in ED Medications  0.9 %  sodium chloride infusion ( Intravenous New Bag/Given 12/14/15 2358)  ceFAZolin (ANCEF) IVPB 2g/100 mL premix (2 g Intravenous New Bag/Given 12/14/15 2358)     Initial Impression / Assessment and Plan / ED Course  Debroah Baller, NP has reviewed the triage vital signs and the nursing notes.  Pertinent imaging results that were available during my care of the patient were reviewed by me and considered in my medical decision making (see chart for details).  Clinical Course   Consult with Dr. Fredna Dow on for hand and he will see the patient. NPO, Consent, undress patient Ancef 2 gram IV  Final Clinical Impressions(s) / ED Diagnoses   Final diagnoses:  Injury of tip of finger of right hand, initial encounter    New Prescriptions New Prescriptions   No medications on file   I personally performed the services described in this documentation, which was scribed in my presence. The recorded information has been reviewed and is accurate.    [address] [place], NP 12/15/15 0020    Duffy Bruce, MD 12/15/15 1340

## 2015-12-14 NOTE — ED Triage Notes (Signed)
Pt here with avulsion to right ring finger and laceration to right pinky. Pt sts he was trying to fix the lawnmower when he cut himself. Bleeding controlled. Wet dressing applied in triage.

## 2015-12-14 NOTE — ED Notes (Signed)
Updated on wait

## 2015-12-15 ENCOUNTER — Encounter (HOSPITAL_COMMUNITY): Payer: Self-pay | Admitting: Certified Registered"

## 2015-12-15 ENCOUNTER — Encounter (HOSPITAL_COMMUNITY)
Admission: EM | Disposition: A | Discharge status: Home / Self Care | Payer: Self-pay | Source: Home / Self Care | Attending: Emergency Medicine

## 2015-12-15 ENCOUNTER — Emergency Department (HOSPITAL_COMMUNITY): Payer: BLUE CROSS/BLUE SHIELD | Admitting: Certified Registered"

## 2015-12-15 HISTORY — PX: I & D EXTREMITY: SHX5045

## 2015-12-15 HISTORY — PX: COMPLEX WOUND CLOSURE: SHX6446

## 2015-12-15 SURGERY — IRRIGATION AND DEBRIDEMENT EXTREMITY
Anesthesia: General | Site: Finger | Laterality: Right

## 2015-12-15 MED ORDER — LIDOCAINE 2% (20 MG/ML) 5 ML SYRINGE
INTRAMUSCULAR | Status: AC
  Filled 2015-12-15: qty 10

## 2015-12-15 MED ORDER — FENTANYL CITRATE (PF) 100 MCG/2ML IJ SOLN
INTRAMUSCULAR | Status: DC | PRN
  Administered 2015-12-15 (×2): 50 ug via INTRAVENOUS

## 2015-12-15 MED ORDER — BUPIVACAINE HCL (PF) 0.25 % IJ SOLN
INTRAMUSCULAR | Status: AC
  Filled 2015-12-15: qty 30

## 2015-12-15 MED ORDER — PROPOFOL 10 MG/ML IV BOLUS
INTRAVENOUS | Status: DC | PRN
Start: 2015-12-15 — End: 2015-12-15
  Administered 2015-12-15: 30 mg via INTRAVENOUS

## 2015-12-15 MED ORDER — BUPIVACAINE HCL (PF) 0.25 % IJ SOLN
INTRAMUSCULAR | Status: DC | PRN
  Administered 2015-12-15: 10 mL

## 2015-12-15 MED ORDER — 0.9 % SODIUM CHLORIDE (POUR BTL) OPTIME
TOPICAL | Status: DC | PRN
  Administered 2015-12-15: 1000 mL

## 2015-12-15 MED ORDER — PROPOFOL 10 MG/ML IV BOLUS
INTRAVENOUS | Status: AC
  Filled 2015-12-15: qty 20

## 2015-12-15 MED ORDER — SUCCINYLCHOLINE CHLORIDE 200 MG/10ML IV SOSY
PREFILLED_SYRINGE | INTRAVENOUS | Status: AC
  Filled 2015-12-15: qty 10

## 2015-12-15 MED ORDER — PHENYLEPHRINE 40 MCG/ML (10ML) SYRINGE FOR IV PUSH (FOR BLOOD PRESSURE SUPPORT)
PREFILLED_SYRINGE | INTRAVENOUS | Status: AC
  Filled 2015-12-15: qty 10

## 2015-12-15 MED ORDER — FENTANYL CITRATE (PF) 100 MCG/2ML IJ SOLN
INTRAMUSCULAR | Status: AC
  Filled 2015-12-15: qty 2

## 2015-12-15 MED ORDER — MIDAZOLAM HCL 2 MG/2ML IJ SOLN
INTRAMUSCULAR | Status: AC
  Filled 2015-12-15: qty 2

## 2015-12-15 MED ORDER — SULFAMETHOXAZOLE-TRIMETHOPRIM 800-160 MG PO TABS: 1.0000 | ORAL_TABLET | Freq: Two times a day (BID) | ORAL | 0 refills | Status: DC

## 2015-12-15 MED ORDER — ONDANSETRON HCL 4 MG/2ML IJ SOLN
INTRAMUSCULAR | Status: AC
  Filled 2015-12-15: qty 4

## 2015-12-15 MED ORDER — HYDROCODONE-ACETAMINOPHEN 5-325 MG PO TABS: ORAL_TABLET | ORAL | 0 refills | Status: DC

## 2015-12-15 MED ORDER — HYDROMORPHONE HCL 1 MG/ML IJ SOLN: 0.2500 mg | INTRAMUSCULAR | Status: DC | PRN

## 2015-12-15 MED ORDER — EPHEDRINE 5 MG/ML INJ
INTRAVENOUS | Status: AC
  Filled 2015-12-15: qty 30

## 2015-12-15 MED ORDER — ONDANSETRON HCL 4 MG/2ML IJ SOLN
INTRAMUSCULAR | Status: DC | PRN
  Administered 2015-12-15: 4 mg via INTRAVENOUS

## 2015-12-15 MED ORDER — PROMETHAZINE HCL 25 MG/ML IJ SOLN: 6.2500 mg | INTRAMUSCULAR | Status: DC | PRN

## 2015-12-15 SURGICAL SUPPLY — 51 items
BANDAGE ACE 4X5 VEL STRL LF (GAUZE/BANDAGES/DRESSINGS) ×3 IMPLANT
BANDAGE COBAN STERILE 2 (GAUZE/BANDAGES/DRESSINGS) IMPLANT
BANDAGE ELASTIC 3 VELCRO ST LF (GAUZE/BANDAGES/DRESSINGS) ×3 IMPLANT
BNDG CMPR 9X4 STRL LF SNTH (GAUZE/BANDAGES/DRESSINGS)
BNDG COHESIVE 1X5 TAN STRL LF (GAUZE/BANDAGES/DRESSINGS) ×2 IMPLANT
BNDG CONFORM 2 STRL LF (GAUZE/BANDAGES/DRESSINGS) IMPLANT
BNDG ESMARK 4X9 LF (GAUZE/BANDAGES/DRESSINGS) IMPLANT
BNDG GAUZE ELAST 4 BULKY (GAUZE/BANDAGES/DRESSINGS) ×3 IMPLANT
CORDS BIPOLAR (ELECTRODE) ×3 IMPLANT
COVER SURGICAL LIGHT HANDLE (MISCELLANEOUS) ×3 IMPLANT
DECANTER SPIKE VIAL GLASS SM (MISCELLANEOUS) ×3 IMPLANT
DRAIN PENROSE 1/4X12 LTX STRL (WOUND CARE) ×2 IMPLANT
DRSG ADAPTIC 3X8 NADH LF (GAUZE/BANDAGES/DRESSINGS) IMPLANT
DRSG EMULSION OIL 3X3 NADH (GAUZE/BANDAGES/DRESSINGS) ×3 IMPLANT
DRSG PAD ABDOMINAL 8X10 ST (GAUZE/BANDAGES/DRESSINGS) ×6 IMPLANT
GAUZE SPONGE 4X4 12PLY STRL (GAUZE/BANDAGES/DRESSINGS) ×3 IMPLANT
GAUZE XEROFORM 1X8 LF (GAUZE/BANDAGES/DRESSINGS) ×3 IMPLANT
GLOVE BIO SURGEON STRL SZ7.5 (GLOVE) ×3 IMPLANT
GLOVE BIOGEL PI IND STRL 8 (GLOVE) ×1 IMPLANT
GLOVE BIOGEL PI INDICATOR 8 (GLOVE) ×2
GOWN STRL REUS W/ TWL LRG LVL3 (GOWN DISPOSABLE) ×1 IMPLANT
GOWN STRL REUS W/TWL LRG LVL3 (GOWN DISPOSABLE) ×3
KIT BASIN OR (CUSTOM PROCEDURE TRAY) ×3 IMPLANT
KIT ROOM TURNOVER OR (KITS) ×3 IMPLANT
LOOP VESSEL MAXI BLUE (MISCELLANEOUS) IMPLANT
MANIFOLD NEPTUNE II (INSTRUMENTS) ×3 IMPLANT
NDL HYPO 25X1 1.5 SAFETY (NEEDLE) IMPLANT
NEEDLE HYPO 25X1 1.5 SAFETY (NEEDLE) ×3 IMPLANT
NS IRRIG 1000ML POUR BTL (IV SOLUTION) ×3 IMPLANT
PACK ORTHO EXTREMITY (CUSTOM PROCEDURE TRAY) ×3 IMPLANT
PAD ARMBOARD 7.5X6 YLW CONV (MISCELLANEOUS) ×6 IMPLANT
SCRUB BETADINE 4OZ XXX (MISCELLANEOUS) ×3 IMPLANT
SET CYSTO W/LG BORE CLAMP LF (SET/KITS/TRAYS/PACK) ×3 IMPLANT
SOLUTION BETADINE 4OZ (MISCELLANEOUS) ×3 IMPLANT
SPLINT FINGER (SOFTGOODS) ×2 IMPLANT
SPONGE GAUZE 4X4 12PLY STER LF (GAUZE/BANDAGES/DRESSINGS) ×2 IMPLANT
SPONGE LAP 4X18 X RAY DECT (DISPOSABLE) ×3 IMPLANT
SUT CHROMIC 5 0 P 3 (SUTURE) ×2 IMPLANT
SUT ETHILON 4 0 P 3 18 (SUTURE) IMPLANT
SUT ETHILON 4 0 PS 2 18 (SUTURE) ×3 IMPLANT
SUT MON AB 5-0 P3 18 (SUTURE) ×2 IMPLANT
SYR CONTROL 10ML LL (SYRINGE) ×2 IMPLANT
TOWEL OR 17X24 6PK STRL BLUE (TOWEL DISPOSABLE) ×3 IMPLANT
TOWEL OR 17X26 10 PK STRL BLUE (TOWEL DISPOSABLE) ×3 IMPLANT
TUBE ANAEROBIC SPECIMEN COL (MISCELLANEOUS) IMPLANT
TUBE CONNECTING 12'X1/4 (SUCTIONS) ×1
TUBE CONNECTING 12X1/4 (SUCTIONS) ×2 IMPLANT
TUBE FEEDING 5FR 15 INCH (TUBING) IMPLANT
UNDERPAD 30X30 (UNDERPADS AND DIAPERS) ×3 IMPLANT
WATER STERILE IRR 1000ML POUR (IV SOLUTION) ×3 IMPLANT
YANKAUER SUCT BULB TIP NO VENT (SUCTIONS) ×3 IMPLANT

## 2015-12-15 NOTE — Anesthesia Preprocedure Evaluation (Addendum)
Anesthesia Evaluation  Patient identified by MRN, date of birth, ID band Patient awake    Reviewed: Allergy & Precautions, NPO status , Patient's Chart, lab work & pertinent test results  History of Anesthesia Complications Negative for: history of anesthetic complications  Airway Mallampati: II  TM Distance: >3 FB  Positive for:  Tracheal deviation   Dental  (+) Poor Dentition, Dental Advisory Given   Pulmonary former smoker,    Pulmonary exam normal        Cardiovascular negative cardio ROS Normal cardiovascular exam     Neuro/Psych negative neurological ROS  negative psych ROS   GI/Hepatic negative GI ROS, Neg liver ROS, GERD  ,(+) Hepatitis -, B  Endo/Other  negative endocrine ROS  Renal/GU negative Renal ROS  negative genitourinary   Musculoskeletal negative musculoskeletal ROS (+)   Abdominal   Peds negative pediatric ROS (+)  Hematology negative hematology ROS (+)   Anesthesia Other Findings   Reproductive/Obstetrics negative OB ROS                            Anesthesia Physical Anesthesia Plan  ASA: II and emergent  Anesthesia Plan: MAC   Post-op Pain Management:    Induction:   Airway Management Planned: Simple Face Mask  Additional Equipment:   Intra-op Plan:   Post-operative Plan: Extubation in OR  Informed Consent: I have reviewed the patients History and Physical, chart, labs and discussed the procedure including the risks, benefits and alternatives for the proposed anesthesia with the patient or authorized representative who has indicated his/her understanding and acceptance.   Dental advisory given  Plan Discussed with: CRNA, Anesthesiologist and Surgeon  Anesthesia Plan Comments:        Anesthesia Quick Evaluation

## 2015-12-15 NOTE — Discharge Instructions (Signed)

## 2015-12-15 NOTE — Anesthesia Procedure Notes (Signed)
Procedure Name: MAC Date/Time: 12/15/2015 12:50 AM Performed by: Babs Bertin Pre-anesthesia Checklist: Patient identified, Emergency Drugs available, Suction available, Patient being monitored and Timeout performed Patient Re-evaluated:Patient Re-evaluated prior to inductionOxygen Delivery Method: Nasal cannula

## 2015-12-15 NOTE — Brief Op Note (Signed)
12/14/2015 - 12/15/2015  1:18 AM  PATIENT:  Jose [name]  45 y.o. male  PRE-OPERATIVE DIAGNOSIS:  finger tip amputation  POST-OPERATIVE DIAGNOSIS:  finger tip soft tissue amputation  PROCEDURE:  Procedure(s): IRRIGATION AND DEBRIDEMENT AND REVISION AMPUTATION RIGHT RING FINGER (Right) COMPLEX WOUND CLOSURE (Right)  SURGEON:  Surgeon(s) and Role:    * Leanora Cover, MD - Primary  PHYSICIAN ASSISTANT:   ASSISTANTS: none   ANESTHESIA:   local and MAC  EBL:  Total I/O In: 400 [I.V.:400] Out: -   BLOOD ADMINISTERED:none  DRAINS: none   LOCAL MEDICATIONS USED:  MARCAINE     SPECIMEN:  No Specimen  DISPOSITION OF SPECIMEN:  N/A  COUNTS:  YES  TOURNIQUET:  * No tourniquets in log *  DICTATION: .Other Dictation: Dictation Number [id number]  PLAN OF CARE: Discharge to home after PACU  PATIENT DISPOSITION:  PACU - hemodynamically stable.

## 2015-12-15 NOTE — H&P (Signed)
  Jose [name] is an 45 y.o. male.   Chief Complaint: right ring finger amputation HPI: 45 yo rhd male states he amputated tip of right ring finger when trying to clear lawnmower.  He reports no previous injury to the right hand.  He describes a stinging pain in the finger and rates it at 8/10.  It is worsened with palpation and alleviated by rest.  Note from [facility], NP from 12/15/2015 reviewed. Xrays viewed and interpreted by me: 3 views right ring finger show possible nondisplaced tuft fracture, no foreign body Labs reviewed: none  Allergies: No Known Allergies  Past Medical History:  Diagnosis Date  . 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     History reviewed. No pertinent surgical history.  Family History: History reviewed. No pertinent family history.  Social History:   reports that he quit smoking about 25 years ago. His smoking use included Cigarettes. He has a 4.00 pack-year smoking history. He has never used smokeless tobacco. He reports that he does not drink alcohol or use drugs.  Medications:  (Not in a hospital admission)  No results found for this or any previous visit (from the past 48 hour(s)).  Dg Finger Ring Right  Result Date: 12/14/2015 CLINICAL DATA:  Laceration of the tip of the right ring finger with a lawnmower blade. EXAM: RIGHT RING FINGER 2+V COMPARISON:  None. FINDINGS: Distal ring finger detail obscured by bandaging. There does appear to be a soft tissue defect along the tip of the ring finger which may reflect amputation of the tip. No bony abnormality or foreign body besides the bandaging identified. IMPRESSION: 1. Amputation of the soft tissue tip of the right ring finger, with surrounding bandaging. No bony abnormality or unexpected foreign body. Electronically Signed   By: Van Clines M.D.   On: 12/14/2015 21:44     A comprehensive review of systems was negative. Review of Systems: No fevers, chills, night sweats,  chest pain, shortness of breath, nausea, vomiting, diarrhea, constipation, easy bleeding or bruising, headaches, dizziness, vision changes, fainting.   Blood pressure 124/62, pulse 84, temperature 98.4 F (36.9 C), temperature source Oral, resp. rate 18, height 5' 5"  (1.651 m), weight 59 kg (130 lb), SpO2 96 %.  General appearance: alert, cooperative and appears stated age Head: Normocephalic, without obvious abnormality, atraumatic Neck: supple, symmetrical, trachea midline Resp: clear to auscultation bilaterally Cardio: regular rate and rhythm GI: non-tender Extremities: Intact sensation and capillary refill all digits.  +epl/fpl/io.  Right ring fingertip with amputation distally at radial side. Pulses: 2+ and symmetric Skin: Skin color, texture, turgor normal. No rashes or lesions Neurologic: Grossly normal Incision/Wound: As above  Assessment/Plan Right ring finger amputation.  Recommend revision amputation.  Risks, benefits, and alternatives of surgery were discussed and the patient agrees with the plan of care.   Lataunya Ruud R 12/15/2015, 12:43 AM

## 2015-12-15 NOTE — Op Note (Signed)
[name], [name] NO.:  [id number]  MEDICAL RECORD NO.:  [id number]  LOCATION:  [facility]                         FACILITY:  [facility]  PHYSICIAN:  Leanora Cover, MD        DATE OF BIRTH:  1970/11/20  DATE OF PROCEDURE:  12/15/2015 DATE OF DISCHARGE:  12/15/2015                              OPERATIVE REPORT   PREOPERATIVE DIAGNOSIS:  Right ring finger tip amputation.  POSTOPERATIVE DIAGNOSIS:  Right ring fingertip soft tissue amputation.  PROCEDURE:  Irrigation and debridement and closure of complex wound, right ring finger.  SURGEON:  Leanora Cover, MD.  ASSISTANTS:  None.  ANESTHESIA:  MAC with local.  IV FLUIDS:  Per Anesthesia flow sheet.  ESTIMATED BLOOD LOSS:  Minimal.  COMPLICATIONS:  None.  SPECIMENS:  None.  TIME OF TOURNIQUET:  Penrose drain approximately 15 minutes.  DISPOSITION:  Stable to PACU.  INDICATIONS:  Jose [name] is a 45 year old right-hand dominant male, who states that he was trying to clear his lawnmower earlier in the evening of September 7th when he amputated the tip of his right ring finger.  He was seen at the [facility] Emergency Department, and I was consulted for management of injury.  I recommended irrigation and debridement of the wound with revision amputation in the operating room.  Risks, benefits, and alternatives of surgery were discussed including risk of blood loss; infection; damage to nerves, vessels, tendons, ligaments, bone; failure of surgery; need for additional surgery; complications with wound healing; continued pain.  He voiced understanding of these risks and elected to proceed.  OPERATIVE COURSE:  After being identified preoperatively by myself, the patient and I agreed upon procedure and site of procedure.  Surgical site was marked.  The risks, benefits, and alternatives of surgery were reviewed and he wished to proceed.  Surgical consent had been signed. He was transferred to the operating room and  placed on the operating room table in supine position with the right upper extremity on arm board.  He had been given IV Ancef in the emergency department.  MAC anesthesia was induced.  A digital block was performed with 10 mL of 0.25% plain Marcaine to aid in postoperative analgesia.  A surgical pause had been performed between surgeons, anesthesia, and operating room staff, and all were in agreement as to the patient, procedure, and site of procedure.  Right upper extremity was prepped and draped in normal sterile orthopedic fashion.  A surgical pause was again performed between surgeons, anesthesia, and operating room staff and all were in agreement as to the patient, procedure, and site of procedure.  A Penrose drain was used as a tourniquet and was up for approximately 15 minutes.  The wound was explored.  It was contaminated with bits of grass.  These were removed with the pickups.  This went all the way down to the bone.  A knife was used to sharply debride the skin and subcutaneous tissues.  The wound was copiously irrigated with sterile saline.  It was then closed with 5-0 Monocryl in an interrupted fashion. The wound was stellate in pattern and there was missing tissue.  Some area was  left for granulation.  The wound was then dressed with sterile Xeroform, 4x4s, and wrapped with a Coban dressing lightly.  Alumafoam splint was placed and wrapped lightly with a Coban dressing.  The Penrose drain was removed.  The patient was awoken from his anesthesia. He tolerated procedure well.  I will see him back in the office in 1 week for postoperative followup.  I will give him Norco 5/325 one to two p.o. q.6 hours p.r.n. pain, dispensed #20 and Bactrim DS 1 p.o. b.i.d. x7 days.     Leanora Cover, MD     KK/MEDQ  D:  12/15/2015  T:  12/15/2015  Job:  [id number]

## 2015-12-15 NOTE — Transfer of Care (Signed)
Immediate Anesthesia Transfer of Care Note  Patient: Jose [name]  Procedure(s) Performed: Procedure(s): IRRIGATION AND DEBRIDEMENT AND REVISION AMPUTATION RIGHT RING FINGER (Right) COMPLEX WOUND CLOSURE (Right)  Patient Location: PACU  Anesthesia Type:MAC  Level of Consciousness: awake, alert  and oriented  Airway & Oxygen Therapy: Patient Spontanous Breathing  Post-op Assessment: Report given to RN and Post -op Vital signs reviewed and stable  Post vital signs: Reviewed and stable  Last Vitals:  Vitals:   12/14/15 2045  BP: 124/62  Pulse: 84  Resp: 18  Temp: 36.9 C    Last Pain:  Vitals:   12/14/15 2244  TempSrc:   PainSc: 7          Complications: No apparent anesthesia complications

## 2015-12-15 NOTE — Op Note (Signed)
[id number] 

## 2015-12-15 NOTE — Anesthesia Postprocedure Evaluation (Signed)
Anesthesia Post Note  Patient: Jose [name]  Procedure(s) Performed: Procedure(s) (LRB): IRRIGATION AND DEBRIDEMENT AND REVISION AMPUTATION RIGHT RING FINGER (Right) COMPLEX WOUND CLOSURE (Right)  Patient location during evaluation: PACU Anesthesia Type: MAC Level of consciousness: awake and alert Pain management: pain level controlled Vital Signs Assessment: post-procedure vital signs reviewed and stable Respiratory status: spontaneous breathing and respiratory function stable Cardiovascular status: stable Anesthetic complications: no    Last Vitals:  Vitals:   12/15/15 0130 12/15/15 0144  BP: 105/63 (!) 103/58  Pulse: 72 70  Resp: 13 (!) 23  Temp:      Last Pain:  Vitals:   12/14/15 2244  TempSrc:   PainSc: 7                  [name] DANIEL

## 2015-12-18 ENCOUNTER — Encounter (HOSPITAL_COMMUNITY): Payer: Self-pay | Admitting: Orthopedic Surgery

## 2016-02-11 ENCOUNTER — Emergency Department (HOSPITAL_COMMUNITY): Payer: BLUE CROSS/BLUE SHIELD

## 2016-02-11 ENCOUNTER — Encounter (HOSPITAL_COMMUNITY): Payer: Self-pay | Admitting: Emergency Medicine

## 2016-02-11 ENCOUNTER — Inpatient Hospital Stay (HOSPITAL_COMMUNITY)
Admission: EM | Admit: 2016-02-11 | Discharge: 2016-02-14 | DRG: 872 | Disposition: A | Discharge status: Home / Self Care | Payer: BLUE CROSS/BLUE SHIELD | Source: Ambulatory Visit | Attending: Internal Medicine | Admitting: Internal Medicine

## 2016-02-11 DIAGNOSIS — K7460 Unspecified cirrhosis of liver: Secondary | ICD-10-CM | POA: Diagnosis not present

## 2016-02-11 DIAGNOSIS — B181 Chronic viral hepatitis B without delta-agent: Secondary | ICD-10-CM | POA: Diagnosis present

## 2016-02-11 DIAGNOSIS — E860 Dehydration: Secondary | ICD-10-CM | POA: Diagnosis present

## 2016-02-11 DIAGNOSIS — Z79891 Long term (current) use of opiate analgesic: Secondary | ICD-10-CM

## 2016-02-11 DIAGNOSIS — D696 Thrombocytopenia, unspecified: Secondary | ICD-10-CM | POA: Diagnosis present

## 2016-02-11 DIAGNOSIS — K529 Noninfective gastroenteritis and colitis, unspecified: Secondary | ICD-10-CM | POA: Diagnosis present

## 2016-02-11 DIAGNOSIS — K7581 Nonalcoholic steatohepatitis (NASH): Secondary | ICD-10-CM

## 2016-02-11 DIAGNOSIS — R17 Unspecified jaundice: Secondary | ICD-10-CM | POA: Diagnosis present

## 2016-02-11 DIAGNOSIS — Z87891 Personal history of nicotine dependence: Secondary | ICD-10-CM

## 2016-02-11 DIAGNOSIS — R197 Diarrhea, unspecified: Secondary | ICD-10-CM

## 2016-02-11 DIAGNOSIS — R000 Tachycardia, unspecified: Secondary | ICD-10-CM

## 2016-02-11 DIAGNOSIS — R7989 Other specified abnormal findings of blood chemistry: Secondary | ICD-10-CM | POA: Diagnosis not present

## 2016-02-11 DIAGNOSIS — Z8611 Personal history of tuberculosis: Secondary | ICD-10-CM

## 2016-02-11 DIAGNOSIS — A419 Sepsis, unspecified organism: Principal | ICD-10-CM | POA: Diagnosis present

## 2016-02-11 DIAGNOSIS — R161 Splenomegaly, not elsewhere classified: Secondary | ICD-10-CM

## 2016-02-11 DIAGNOSIS — B9620 Unspecified Escherichia coli [E. coli] as the cause of diseases classified elsewhere: Secondary | ICD-10-CM | POA: Diagnosis present

## 2016-02-11 DIAGNOSIS — B1910 Unspecified viral hepatitis B without hepatic coma: Secondary | ICD-10-CM | POA: Diagnosis present

## 2016-02-11 DIAGNOSIS — K7030 Alcoholic cirrhosis of liver without ascites: Secondary | ICD-10-CM

## 2016-02-11 DIAGNOSIS — K219 Gastro-esophageal reflux disease without esophagitis: Secondary | ICD-10-CM | POA: Diagnosis present

## 2016-02-11 DIAGNOSIS — E8809 Other disorders of plasma-protein metabolism, not elsewhere classified: Secondary | ICD-10-CM | POA: Diagnosis present

## 2016-02-11 DIAGNOSIS — Z79899 Other long term (current) drug therapy: Secondary | ICD-10-CM

## 2016-02-11 DIAGNOSIS — D689 Coagulation defect, unspecified: Secondary | ICD-10-CM | POA: Diagnosis present

## 2016-02-11 DIAGNOSIS — E7220 Disorder of urea cycle metabolism, unspecified: Secondary | ICD-10-CM

## 2016-02-11 DIAGNOSIS — N179 Acute kidney failure, unspecified: Secondary | ICD-10-CM | POA: Diagnosis present

## 2016-02-11 DIAGNOSIS — E440 Moderate protein-calorie malnutrition: Secondary | ICD-10-CM | POA: Diagnosis present

## 2016-02-11 LAB — PROTIME-INR
INR: 8.36 — AB
INR: 2.28
INR: 2.19
PROTHROMBIN TIME: 72.1 s — AB (ref 11.4–15.2)
Prothrombin Time: 25.5 seconds — ABNORMAL HIGH (ref 11.4–15.2)
Prothrombin Time: 24.7 seconds — ABNORMAL HIGH (ref 11.4–15.2)

## 2016-02-11 LAB — URINALYSIS, ROUTINE W REFLEX MICROSCOPIC
BILIRUBIN URINE: NEGATIVE
GLUCOSE, UA: NEGATIVE mg/dL
HGB URINE DIPSTICK: NEGATIVE
KETONES UR: NEGATIVE mg/dL
Leukocytes, UA: NEGATIVE
Nitrite: NEGATIVE
PROTEIN: NEGATIVE mg/dL
Specific Gravity, Urine: 1.022 (ref 1.005–1.030)
pH: 6 (ref 5.0–8.0)

## 2016-02-11 LAB — CBC WITH DIFFERENTIAL/PLATELET
BASOS ABS: 0 10*3/uL (ref 0.0–0.1)
BASOS PCT: 0 %
EOS PCT: 0 %
Eosinophils Absolute: 0 10*3/uL (ref 0.0–0.7)
HEMATOCRIT: 37.1 % — AB (ref 39.0–52.0)
Hemoglobin: 12.4 g/dL — ABNORMAL LOW (ref 13.0–17.0)
LYMPHS ABS: 0.5 10*3/uL — AB (ref 0.7–4.0)
Lymphocytes Relative: 6 %
MCH: 31.2 pg (ref 26.0–34.0)
MCHC: 33.4 g/dL (ref 30.0–36.0)
MCV: 93.5 fL (ref 78.0–100.0)
MONOS PCT: 6 %
Monocytes Absolute: 0.5 10*3/uL (ref 0.1–1.0)
NEUTROS ABS: 7 10*3/uL (ref 1.7–7.7)
Neutrophils Relative %: 88 %
Platelets: 17 10*3/uL — CL (ref 150–400)
RBC: 3.97 MIL/uL — ABNORMAL LOW (ref 4.22–5.81)
RDW: 15.1 % (ref 11.5–15.5)
WBC Morphology: INCREASED
WBC: 8 10*3/uL (ref 4.0–10.5)

## 2016-02-11 LAB — BLOOD CULTURE ID PANEL (REFLEXED)
Acinetobacter baumannii: NOT DETECTED
CANDIDA ALBICANS: NOT DETECTED
CANDIDA GLABRATA: NOT DETECTED
CANDIDA PARAPSILOSIS: NOT DETECTED
Candida krusei: NOT DETECTED
Candida tropicalis: NOT DETECTED
Carbapenem resistance: NOT DETECTED
ENTEROBACTER CLOACAE COMPLEX: NOT DETECTED
ENTEROBACTERIACEAE SPECIES: DETECTED — AB
ENTEROCOCCUS SPECIES: NOT DETECTED
ESCHERICHIA COLI: DETECTED — AB
Haemophilus influenzae: NOT DETECTED
KLEBSIELLA PNEUMONIAE: NOT DETECTED
Klebsiella oxytoca: NOT DETECTED
Listeria monocytogenes: NOT DETECTED
METHICILLIN RESISTANCE: NOT DETECTED
Neisseria meningitidis: NOT DETECTED
PSEUDOMONAS AERUGINOSA: NOT DETECTED
Proteus species: NOT DETECTED
STAPHYLOCOCCUS AUREUS BCID: NOT DETECTED
STREPTOCOCCUS AGALACTIAE: NOT DETECTED
STREPTOCOCCUS PNEUMONIAE: NOT DETECTED
Serratia marcescens: NOT DETECTED
Staphylococcus species: NOT DETECTED
Streptococcus pyogenes: NOT DETECTED
Streptococcus species: NOT DETECTED
VANCOMYCIN RESISTANCE: NOT DETECTED

## 2016-02-11 LAB — COMPREHENSIVE METABOLIC PANEL
ALT: 31 U/L (ref 17–63)
AST: 36 U/L (ref 15–41)
Albumin: 2.5 g/dL — ABNORMAL LOW (ref 3.5–5.0)
Alkaline Phosphatase: 45 U/L (ref 38–126)
Anion gap: 50 — ABNORMAL HIGH (ref 5–15)
BILIRUBIN TOTAL: 3.6 mg/dL — AB (ref 0.3–1.2)
BUN: 24 mg/dL — AB (ref 6–20)
CALCIUM: 5.3 mg/dL — AB (ref 8.9–10.3)
CO2: 17 mmol/L — ABNORMAL LOW (ref 22–32)
CREATININE: 1.52 mg/dL — AB (ref 0.61–1.24)
Chloride: 76 mmol/L — ABNORMAL LOW (ref 101–111)
GFR calc Af Amer: >60 mL/min (ref >60)
GFR, EST NON AFRICAN AMERICAN: 53 mL/min — AB (ref >60)
Glucose, Bld: 199 mg/dL — ABNORMAL HIGH (ref 65–99)
Potassium: 3.6 mmol/L (ref 3.5–5.1)
Sodium: 143 mmol/L (ref 135–145)
TOTAL PROTEIN: 8.7 g/dL — AB (ref 6.5–8.1)

## 2016-02-11 LAB — CBC
HCT: 38.1 % — ABNORMAL LOW (ref 39.0–52.0)
Hemoglobin: 12.8 g/dL — ABNORMAL LOW (ref 13.0–17.0)
MCH: 31.4 pg (ref 26.0–34.0)
MCHC: 33.6 g/dL (ref 30.0–36.0)
MCV: 93.4 fL (ref 78.0–100.0)
PLATELETS: 15 10*3/uL — AB (ref 150–400)
RBC: 4.08 MIL/uL — AB (ref 4.22–5.81)
RDW: 15 % (ref 11.5–15.5)
WBC: 5.7 10*3/uL (ref 4.0–10.5)

## 2016-02-11 LAB — I-STAT CHEM 8, ED
BUN: 27 mg/dL — ABNORMAL HIGH (ref 6–20)
CHLORIDE: 107 mmol/L (ref 101–111)
Calcium, Ion: 0.99 mmol/L — ABNORMAL LOW (ref 1.15–1.40)
Creatinine, Ser: 1.4 mg/dL — ABNORMAL HIGH (ref 0.61–1.24)
GLUCOSE: 123 mg/dL — AB (ref 65–99)
HCT: 39 % (ref 39.0–52.0)
HEMOGLOBIN: 13.3 g/dL (ref 13.0–17.0)
POTASSIUM: 4.8 mmol/L (ref 3.5–5.1)
SODIUM: 141 mmol/L (ref 135–145)
TCO2: 23 mmol/L (ref 0–100)

## 2016-02-11 LAB — TSH: TSH: 0.318 u[IU]/mL — ABNORMAL LOW (ref 0.350–4.500)

## 2016-02-11 LAB — I-STAT CG4 LACTIC ACID, ED
LACTIC ACID, VENOUS: 4.49 mmol/L — AB (ref 0.5–1.9)
Lactic Acid, Venous: 5.14 mmol/L (ref 0.5–1.9)

## 2016-02-11 LAB — INFLUENZA PANEL BY PCR (TYPE A & B)
INFLBPCR: NEGATIVE
Influenza A By PCR: NEGATIVE

## 2016-02-11 LAB — CBG MONITORING, ED: Glucose-Capillary: 114 mg/dL — ABNORMAL HIGH (ref 65–99)

## 2016-02-11 LAB — LIPASE, BLOOD: Lipase: 27 U/L (ref 11–51)

## 2016-02-11 LAB — AMMONIA: AMMONIA: 47 umol/L — AB (ref 9–35)

## 2016-02-11 MED ORDER — IOPAMIDOL (ISOVUE-300) INJECTION 61%
100.0000 mL | Freq: Once | INTRAVENOUS | Status: AC | PRN
  Administered 2016-02-11: 100 mL via INTRAVENOUS

## 2016-02-11 MED ORDER — PIPERACILLIN-TAZOBACTAM 3.375 G IVPB 30 MIN
3.3750 g | Freq: Once | INTRAVENOUS | Status: AC
  Administered 2016-02-11: 3.375 g via INTRAVENOUS
  Filled 2016-02-11: qty 50

## 2016-02-11 MED ORDER — HYDROCODONE-ACETAMINOPHEN 5-325 MG PO TABS
1.0000 | ORAL_TABLET | ORAL | Status: DC | PRN
  Administered 2016-02-11: 1 via ORAL
  Administered 2016-02-11 (×2): 2 via ORAL
  Administered 2016-02-12: 1 via ORAL
  Administered 2016-02-12 – 2016-02-14 (×7): 2 via ORAL
  Filled 2016-02-11 (×7): qty 2
  Filled 2016-02-11 (×2): qty 1
  Filled 2016-02-11 (×2): qty 2

## 2016-02-11 MED ORDER — VANCOMYCIN HCL IN DEXTROSE 1-5 GM/200ML-% IV SOLN
1000.0000 mg | Freq: Once | INTRAVENOUS | Status: AC
  Administered 2016-02-11: 1000 mg via INTRAVENOUS
  Filled 2016-02-11: qty 200

## 2016-02-11 MED ORDER — VANCOMYCIN HCL 500 MG IV SOLR: 500.0000 mg | Freq: Two times a day (BID) | INTRAVENOUS | Status: DC

## 2016-02-11 MED ORDER — GUAIFENESIN-DM 100-10 MG/5ML PO SYRP
5.0000 mL | ORAL_SOLUTION | ORAL | Status: DC | PRN
  Administered 2016-02-11: 5 mL via ORAL
  Filled 2016-02-11: qty 10

## 2016-02-11 MED ORDER — SODIUM CHLORIDE 0.9 % IV SOLN
INTRAVENOUS | Status: DC
  Administered 2016-02-11: 11:00:00 via INTRAVENOUS

## 2016-02-11 MED ORDER — ACETAMINOPHEN 650 MG RE SUPP: 650.0000 mg | Freq: Four times a day (QID) | RECTAL | Status: DC | PRN

## 2016-02-11 MED ORDER — SODIUM CHLORIDE 0.9 % IV BOLUS (SEPSIS)
1000.0000 mL | Freq: Once | INTRAVENOUS | Status: AC
  Administered 2016-02-11: 1000 mL via INTRAVENOUS

## 2016-02-11 MED ORDER — ONDANSETRON HCL 4 MG PO TABS: 4.0000 mg | ORAL_TABLET | Freq: Four times a day (QID) | ORAL | Status: DC | PRN

## 2016-02-11 MED ORDER — MORPHINE SULFATE (PF) 2 MG/ML IV SOLN
1.0000 mg | INTRAVENOUS | Status: DC | PRN
  Administered 2016-02-11 (×2): 2 mg via INTRAVENOUS
  Filled 2016-02-11 (×2): qty 1

## 2016-02-11 MED ORDER — PIPERACILLIN-TAZOBACTAM 3.375 G IVPB: 3.3750 g | Freq: Three times a day (TID) | INTRAVENOUS | Status: DC

## 2016-02-11 MED ORDER — ACETAMINOPHEN 325 MG PO TABS: 650.0000 mg | ORAL_TABLET | Freq: Four times a day (QID) | ORAL | Status: DC | PRN

## 2016-02-11 MED ORDER — CIPROFLOXACIN IN D5W 400 MG/200ML IV SOLN
400.0000 mg | Freq: Two times a day (BID) | INTRAVENOUS | Status: DC
  Administered 2016-02-11 (×2): 400 mg via INTRAVENOUS
  Filled 2016-02-11 (×2): qty 200

## 2016-02-11 MED ORDER — ONDANSETRON HCL 4 MG/2ML IJ SOLN
4.0000 mg | Freq: Four times a day (QID) | INTRAMUSCULAR | Status: DC | PRN
  Administered 2016-02-13 (×2): 4 mg via INTRAVENOUS
  Filled 2016-02-11 (×2): qty 2

## 2016-02-11 MED ORDER — METRONIDAZOLE IN NACL 5-0.79 MG/ML-% IV SOLN
500.0000 mg | Freq: Three times a day (TID) | INTRAVENOUS | Status: DC
  Administered 2016-02-11 – 2016-02-12 (×3): 500 mg via INTRAVENOUS
  Filled 2016-02-11 (×4): qty 100

## 2016-02-11 NOTE — H&P (Signed)
History and Physical    Jolene Provost Gugel AYT:[id number] DOB: 09-Jan-1971 DOA: 02/11/2016  PCP: No PCP Per Patient  Patient coming from: Home  Chief Complaint: Abdominal pain  HPI: Jose Snow is a 45 y.o. [place]  male with medical history significant of treated pulmonary tuberculosis and hepatic cirrhosis came in to the hospital with abdominal pain. Patient story started on Saturday when he started to have diarrhea, this is associated with diffuse abdominal pain especially the right side, denies any nausea or vomiting. Patient tried to take some Imodium but did not help so came into the hospital today because pain is so severe. Was found to have lactic acid of 4.49, acute kidney injury creatinine of 1.5.  ED Course:  Vitals: Tachycardic and tachypnea on admission, placed on oxygen Labs: Creatinine 1.5 and lactic acid 4.49. Imaging: CT showed ascending colon thickening could be infectious versus IBS Interventions: Patient started on antibiotics  Review of Systems:  Unobtainable secondary to language barrier he speaks little [place]. His daughter was at bedside translating [place] and [place].  Past Medical History:  Diagnosis Date  . 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     Past Surgical History:  Procedure Laterality Date  . COMPLEX WOUND CLOSURE Right 12/15/2015   Procedure: COMPLEX WOUND CLOSURE;  Surgeon: Leanora Cover, MD;  Location: [place];  Service: Orthopedics;  Laterality: Right;  . I&D EXTREMITY Right 12/15/2015   Procedure: IRRIGATION AND DEBRIDEMENT AND REVISION AMPUTATION RIGHT RING FINGER;  Surgeon: Leanora Cover, MD;  Location: [place];  Service: Orthopedics;  Laterality: Right;     reports that he quit smoking about 25 years ago. His smoking use included Cigarettes. He has a 4.00 pack-year smoking history. He has never used smokeless tobacco. He reports that he does not drink alcohol or use drugs.  No Known Allergies  Family  history No history of colon cancer  Prior to Admission medications   Medication Sig Start Date End Date Taking? Authorizing Provider  HYDROcodone-acetaminophen (NORCO) 5-325 MG tablet 1-2 tabs po q6 hours prn pain Patient not taking: Reported on 02/11/2016 12/15/15   Leanora Cover, MD  omeprazole (PRILOSEC) 40 MG capsule Take 1 capsule (40 mg total) by mouth daily. Patient not taking: Reported on 02/11/2016 11/26/14   Orson Eva, MD    Physical Exam:  Vitals:   02/11/16 0610 02/11/16 0618 02/11/16 0722 02/11/16 0744  BP:  127/63 119/63 117/59  Pulse:   118 111  Resp:   (!) 35 26  Temp: 98.8 F (37.1 C)  98.8 F (37.1 C)   TempSrc: Rectal  Oral   SpO2:   95% 97%  Weight:        Constitutional: NAD, calm, comfortable Eyes: PERRL, lids and conjunctivae normal ENMT: Mucous membranes are moist. Posterior pharynx clear of any exudate or lesions.Normal dentition.  Neck: normal, supple, no masses, no thyromegaly Respiratory: clear to auscultation bilaterally, no wheezing, no crackles. Normal respiratory effort. No accessory muscle use.  Cardiovascular: Regular rate and rhythm, no murmurs / rubs / gallops. No extremity edema. 2+ pedal pulses. No carotid bruits.  Abdomen: no tenderness, no masses palpated. No hepatosplenomegaly. Bowel sounds positive.  Musculoskeletal: no clubbing / cyanosis. No joint deformity upper and lower extremities. Good ROM, no contractures. Normal muscle tone.  Skin: no rashes, lesions, ulcers. No induration Neurologic: CN 2-12 grossly intact. Sensation intact, DTR normal. Strength 5/5 in all 4.  Psychiatric: Normal judgment and insight. Alert and oriented  x 3. Normal mood.   Labs on Admission: I have personally reviewed following labs and imaging studies  CBC:  Recent Labs Lab 02/11/16 0515 02/11/16 0624 02/11/16 0635  WBC 5.7  --  8.0  NEUTROABS  --   --  PENDING  HGB 12.8* 13.3 12.4*  HCT 38.1* 39.0 37.1*  MCV 93.4  --  93.5  PLT 15*  --  17*   Basic  Metabolic Panel:  Recent Labs Lab 02/11/16 0515 02/11/16 0624  NA 143 141  K 3.6 4.8  CL 76* 107  CO2 17*  --   GLUCOSE 199* 123*  BUN 24* 27*  CREATININE 1.52* 1.40*  CALCIUM 5.3*  --    GFR: Estimated Creatinine Clearance: 54.4 mL/min (by C-G formula based on SCr of 1.4 mg/dL (H)). Liver Function Tests:  Recent Labs Lab 02/11/16 0515  AST 36  ALT 31  ALKPHOS 45  BILITOT 3.6*  PROT 8.7*  ALBUMIN 2.5*    Recent Labs Lab 02/11/16 0515  LIPASE 27    Recent Labs Lab 02/11/16 0515  AMMONIA 47*   Coagulation Profile:  Recent Labs Lab 02/11/16 0635  INR 2.28   Cardiac Enzymes: No results for input(s): CKTOTAL, CKMB, CKMBINDEX, TROPONINI in the last 168 hours. BNP (last 3 results) No results for input(s): PROBNP in the last 8760 hours. HbA1C: No results for input(s): HGBA1C in the last 72 hours. CBG:  Recent Labs Lab 02/11/16 0622  GLUCAP 114*   Lipid Profile: No results for input(s): CHOL, HDL, LDLCALC, TRIG, CHOLHDL, LDLDIRECT in the last 72 hours. Thyroid Function Tests: No results for input(s): TSH, T4TOTAL, FREET4, T3FREE, THYROIDAB in the last 72 hours. Anemia Panel: No results for input(s): VITAMINB12, FOLATE, FERRITIN, TIBC, IRON, RETICCTPCT in the last 72 hours. Urine analysis:    Component Value Date/Time   COLORURINE AMBER (A) 02/11/2016 0636   APPEARANCEUR CLEAR 02/11/2016 0636   LABSPEC 1.022 02/11/2016 0636   PHURINE 6.0 02/11/2016 0636   GLUCOSEU NEGATIVE 02/11/2016 0636   HGBUR NEGATIVE 02/11/2016 0636   BILIRUBINUR NEGATIVE 02/11/2016 0636   KETONESUR NEGATIVE 02/11/2016 0636   PROTEINUR NEGATIVE 02/11/2016 0636   UROBILINOGEN 1.0 11/25/2014 1440   NITRITE NEGATIVE 02/11/2016 0636   LEUKOCYTESUR NEGATIVE 02/11/2016 0636   Sepsis Labs: !!!!!!!!!!!!!!!!!!!!!!!!!!!!!!!!!!!!!!!!!!!! Invalid input(s): PROCALCITONIN, LACTICIDVEN No results found for this or any previous visit (from the past 240 hour(s)).   Radiological Exams  on Admission: Dg Chest 2 View  Result Date: 02/11/2016 CLINICAL DATA:  Right lower quadrant abdominal pain since yesterday. EXAM: CHEST  2 VIEW COMPARISON:  04/10/2013 FINDINGS: Chronic scarring throughout both lungs. Mild chronic blunting of the left lateral costophrenic angle. These abnormalities are unchanged from 04/10/2013. No superimposed acute cardiopulmonary process. No airspace consolidation. No effusions. IMPRESSION: Severe chronic changes.  No acute findings are evident. Electronically Signed   By: Andreas Newport M.D.   On: 02/11/2016 04:52   Ct Abdomen Pelvis W Contrast  Result Date: 02/11/2016 CLINICAL DATA:  RIGHT lower abdominal pain since Friday, history cirrhosis, TB EXAM: CT ABDOMEN AND PELVIS WITH CONTRAST TECHNIQUE: Multidetector CT imaging of the abdomen and pelvis was performed using the standard protocol following bolus administration of intravenous contrast. Sagittal and coronal MPR images reconstructed from axial data set. CONTRAST:  142m ISOVUE-300 IOPAMIDOL (ISOVUE-300) INJECTION 61% IV. No oral contrast was administered. COMPARISON:  11/25/2014 FINDINGS: Lower chest: Scarring LEFT lower lobe with calcified granulomata. Question pulmonary vascular congestion. Hepatobiliary: Small nodular cirrhotic liver without mass. Distended gallbladder. No biliary  dilatation. Pancreas: Normal appearance Spleen: Diffusely enlarged, 14.8 x 7.8 x 18.8 cm (volume = 1100 cm^3). No focal mass. Adrenals/Urinary Tract: Adrenal glands normal appearance. Compression of upper pole LEFT kidney by enlarged spleen. Kidneys, ureters and bladder otherwise normal appearance. Stomach/Bowel: Appendix not definitely visualized. Thickened ascending colon through hepatic flexure compatible with colitis. Similar though less extensive findings at the mid descending colon. Stomach and small bowel loops otherwise unremarkable. Vascular/Lymphatic: Aorta normal caliber without aneurysm. No adenopathy. Portal, superior  mesenteric and splenic veins patent. Reproductive: N/A Other: Minimal perihepatic ascites.  No free air.  No hernia. Musculoskeletal: Unremarkable IMPRESSION: Cirrhotic liver with splenomegaly and minimal ascites. Wall thickening of the ascending colon through hepatic flexure and of the mid descending colon compatible with colitis, question due to infection or inflammatory bowel disease. LEFT lower lobe scarring and old granulomatous changes question related to patient's history of TB. Electronically Signed   By: Lavonia Dana M.D.   On: 02/11/2016 07:38   Dg Chest Port 1 View  Result Date: 02/11/2016 CLINICAL DATA:  Bilateral rales EXAM: PORTABLE CHEST 1 VIEW COMPARISON:  02/11/2016 at 04:36 FINDINGS: Mildly increased interstitial prominence, likely representing a degree of interstitial edema. No frank alveolar edema. No effusions. IMPRESSION: Mildly increased prominence of the interstitium, consistent with a degree of interstitial edema. Electronically Signed   By: Andreas Newport M.D.   On: 02/11/2016 06:57    EKG: Independently reviewed.   Assessment/Plan Principal Problem:   Colitis Active Problems:   History of TB (tuberculosis)   Cirrhosis (HCC)   Thrombocytopenia (HCC)   Elevated bilirubin   Elevated lactic acid level   Colitis -Presented with abdominal pain, diarrhea, CT scan showed ascending colitis, no other acute findings. -Started on IV Cipro and Flagyl. -Clear liquid diet, advance when pain improves.  Elevated lactic acid -This is likely secondary to liver cirrhosis and hyperlactemia rather than its from lactic acidosis. -Stop checking lactic acid, follow patient clinically.  Hepatic cirrhosis -Has thrombocytopenia, hyperammonemia, hypoalbuminemia and hyperbilirubinemia. -Worsening of total bilirubin to 3.6 since last year, baseline was 0.8.  Acute kidney injury -Baseline creatinine from August 2016 is 1.2, presented with creatinine of 1.5. -Likely secondary to  dehydration and concurrent infection, given IV fluids in the ED. -Per ED physician started to have shortness of breath after fluid was started so that was discontinued. -Hydrate orally and follow patient clinically.  Chronic hepatitis B -Positive HepBeAg ([place] antigen) in August 2016 indicating chronicity of hepatitis B. -Follow as outpatient.  Coagulopathy -INR is 2.28, indicating worsening of synthetic liver function. -His MELD score is 24 with 19.6% estimated 3 month mortality.  DVT prophylaxis: SCD Code Status: Full code Family Communication: Plan D/W patient Disposition Plan: Home Consults called:  Admission status: Observation, telemetry.   [facility] A MD [facility] Pager [phone]  If 7PM-7AM, please contact night-coverage [email or web address] Password [facility]  02/11/2016, 8:33 AM

## 2016-02-11 NOTE — ED Provider Notes (Signed)
[facility] DEPT Provider Note   CSN: [id number] Arrival date & time: 02/11/16  0341     History   Chief Complaint Chief Complaint  Patient presents with  . Abdominal Pain    HPI Jose Snow is a 45 y.o. male with a hx of Hepatitis B, liver cirrhosis, thrombocytopenia, GERD, alcohol disease, TB (treated for 15 months about the permanent 2012) presents to the Emergency Department complaining of gradual, persistent, progressively worsening generalized weakness, fevers onset 4 days ago. Associated symptoms include watery diarrhea without hematochezia or melena. Right lower quadrant abdominal pain onset 2 days ago. Patient has been taking Imodium, Tylenol and Alka-Seltzer cold. No known aggravating or alleviating factors. Patient denies headache, neck pain, chest pain, shortness of breath. Patient's daughter at bedside and translates and states that patient has had decreased activity and decreased by mouth intake.  Patient denies abdominal surgeries.  Last international travel was [place] in July.     The history is provided by the patient and medical records. No language interpreter was used.    Past Medical History:  Diagnosis Date  . 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     Patient Active Problem List   Diagnosis Date Noted  . Hepatitis B 02/17/2015  . Elevated bilirubin   . Nausea with vomiting   . Right lower quadrant abdominal pain   . Cirrhosis ([place]) 11/25/2014  . Thrombocytopenia ([place]) 11/25/2014  . Abdominal pain 11/25/2014  . Tuberculosis exposure 07/16/2010  . GASTROESOPHAGEAL REFLUX DISEASE, MILD 01/05/2008  . BACK PAIN, LUMBAR 11/02/2007    Past Surgical History:  Procedure Laterality Date  . COMPLEX WOUND CLOSURE Right 12/15/2015   Procedure: COMPLEX WOUND CLOSURE;  Surgeon: Leanora Cover, MD;  Location: [place];  Service: Orthopedics;  Laterality: Right;  . I&D EXTREMITY Right 12/15/2015   Procedure: IRRIGATION AND DEBRIDEMENT AND REVISION  AMPUTATION RIGHT RING FINGER;  Surgeon: Leanora Cover, MD;  Location: [place];  Service: Orthopedics;  Laterality: Right;       Home Medications    Prior to Admission medications   Medication Sig Start Date End Date Taking? Authorizing Provider  HYDROcodone-acetaminophen [facility]) 5-325 MG tablet 1-2 tabs po q6 hours prn pain 12/15/15   Leanora Cover, MD  omeprazole (PRILOSEC) 40 MG capsule Take 1 capsule (40 mg total) by mouth daily. 11/26/14   Orson Eva, MD  sulfamethoxazole-trimethoprim (BACTRIM DS) 800-160 MG tablet Take 1 tablet by mouth 2 (two) times daily. 12/15/15   Leanora Cover, MD    Family History History reviewed. No pertinent family history.  Social History Social History  Substance Use Topics  . Smoking status: Former Smoker    Packs/day: 0.50    Years: 8.00    Types: Cigarettes    Quit date: 04/08/1990  . Smokeless tobacco: Never Used  . Alcohol use No     Allergies   Patient has no known allergies.   Review of Systems Review of Systems  Constitutional: Positive for fever.  Respiratory: Positive for cough and shortness of breath.   Gastrointestinal: Positive for abdominal pain.  Neurological: Positive for weakness.  All other systems reviewed and are negative.    Physical Exam Updated Vital Signs BP 128/64 (BP Location: Left Arm)   Pulse 110   Temp 98.9 F (37.2 C) (Oral)   Resp (!) 38   SpO2 94%   Physical Exam  Constitutional: He appears well-developed and well-nourished. He appears lethargic. He appears ill. He appears distressed.  Awake,  alert, nontoxic appearance  HENT:  Head: Normocephalic and atraumatic.  Mouth/Throat: Oropharynx is clear and moist. Mucous membranes are dry. No oropharyngeal exudate.  Eyes: Conjunctivae are normal. No scleral icterus.  Neck: Normal range of motion. Neck supple.  Cardiovascular: Regular rhythm and intact distal pulses.  Tachycardia present.   No murmur heard. Pulses:      Radial pulses are 2+ on the right side,  and 2+ on the left side.       Dorsalis pedis pulses are 2+ on the right side, and 2+ on the left side.  Pulmonary/Chest: Accessory muscle usage present. Tachypnea noted. He has decreased breath sounds ( throughout). He has no wheezes. He has rhonchi in the left middle field and the left lower field. He has rales in the left lower field.  Equal chest expansion Generally increased effort without accessory muscle usage  Abdominal: Soft. Bowel sounds are normal. He exhibits no mass. There is tenderness in the right lower quadrant and suprapubic area. There is no rebound and no guarding.  Musculoskeletal: Normal range of motion. He exhibits no edema.  Moves all extremities equally  Neurological: He appears lethargic.  Speech is clear and goal oriented Moves extremities without ataxia  Skin: Skin is warm and dry. No rash noted. He is not diaphoretic.  Hot to touch  Psychiatric: He has a normal mood and affect.  Nursing note and vitals reviewed.    ED Treatments / Results  Labs (all labs ordered are listed, but only abnormal results are displayed) Labs Reviewed  COMPREHENSIVE METABOLIC PANEL - Abnormal; Notable for the following:       Result Value   Chloride 76 (*)    CO2 17 (*)    Glucose, Bld 199 (*)    BUN 24 (*)    Creatinine, Ser 1.52 (*)    Calcium 5.3 (*)    Total Protein 8.7 (*)    Albumin 2.5 (*)    Total Bilirubin 3.6 (*)    GFR calc non Af Amer 53 (*)    Anion gap 50 (*)    All other components within normal limits  CBC - Abnormal; Notable for the following:    RBC 4.08 (*)    Hemoglobin 12.8 (*)    HCT 38.1 (*)    Platelets 15 (*)    All other components within normal limits  AMMONIA - Abnormal; Notable for the following:    Ammonia 47 (*)    All other components within normal limits  I-STAT CG4 LACTIC ACID, ED - Abnormal; Notable for the following:    Lactic Acid, Venous 4.49 (*)    All other components within normal limits  I-STAT CHEM 8, ED - Abnormal;  Notable for the following:    BUN 27 (*)    Creatinine, Ser 1.40 (*)    Glucose, Bld 123 (*)    Calcium, Ion 0.99 (*)    All other components within normal limits  CBG MONITORING, ED - Abnormal; Notable for the following:    Glucose-Capillary 114 (*)    All other components within normal limits  CULTURE, BLOOD (ROUTINE X 2)  CULTURE, BLOOD (ROUTINE X 2)  URINE CULTURE  C DIFFICILE QUICK SCREEN W PCR REFLEX  LIPASE, BLOOD  URINALYSIS, ROUTINE W REFLEX MICROSCOPIC (NOT AT [facility])  ETHANOL  ACETAMINOPHEN LEVEL  SALICYLATE LEVEL  PROTIME-INR  CBC WITH DIFFERENTIAL/PLATELET  INFLUENZA PANEL BY PCR (TYPE A & B, H1N1)  I-STAT CG4 LACTIC ACID, ED  Radiology Dg Chest 2 View  Result Date: 02/11/2016 CLINICAL DATA:  Right lower quadrant abdominal pain since yesterday. EXAM: CHEST  2 VIEW COMPARISON:  04/10/2013 FINDINGS: Chronic scarring throughout both lungs. Mild chronic blunting of the left lateral costophrenic angle. These abnormalities are unchanged from 04/10/2013. No superimposed acute cardiopulmonary process. No airspace consolidation. No effusions. IMPRESSION: Severe chronic changes.  No acute findings are evident. Electronically Signed   By: Andreas Newport M.D.   On: 02/11/2016 04:52   Dg Chest Port 1 View  Result Date: 02/11/2016 CLINICAL DATA:  Bilateral rales EXAM: PORTABLE CHEST 1 VIEW COMPARISON:  02/11/2016 at 04:36 FINDINGS: Mildly increased interstitial prominence, likely representing a degree of interstitial edema. No frank alveolar edema. No effusions. IMPRESSION: Mildly increased prominence of the interstitium, consistent with a degree of interstitial edema. Electronically Signed   By: Andreas Newport M.D.   On: 02/11/2016 06:57    Procedures Procedures (including critical care time)  CRITICAL CARE Performed by: Abigail Butts Total critical care time: 35 minutes Critical care time was exclusive of separately billable procedures and treating other  patients. Critical care was necessary to treat or prevent imminent or life-threatening deterioration. Critical care was time spent personally by me on the following activities: development of treatment plan with patient and/or surrogate as well as nursing, discussions with consultants, evaluation of patient's response to treatment, examination of patient, obtaining history from patient or surrogate, ordering and performing treatments and interventions, ordering and review of laboratory studies, ordering and review of radiographic studies, pulse oximetry and re-evaluation of patient's condition.   Medications Ordered in ED Medications  vancomycin (VANCOCIN) 500 mg in sodium chloride 0.9 % 100 mL IVPB (not administered)  piperacillin-tazobactam (ZOSYN) IVPB 3.375 g (not administered)  sodium chloride 0.9 % bolus 1,000 mL (0 mLs Intravenous Stopped 02/11/16 0613)    And  sodium chloride 0.9 % bolus 1,000 mL (0 mLs Intravenous Stopped 02/11/16 0613)  piperacillin-tazobactam (ZOSYN) IVPB 3.375 g (0 g Intravenous Stopped 02/11/16 0614)  vancomycin (VANCOCIN) IVPB 1000 mg/200 mL premix (0 mg Intravenous Stopped 02/11/16 0614)  iopamidol (ISOVUE-300) 61 % injection 100 mL (100 mLs Intravenous Contrast Given 02/11/16 0645)     Initial Impression / Assessment and Plan / ED Course  I have reviewed the triage vital signs and the nursing notes.  Pertinent labs & imaging results that were available during my care of the patient were reviewed by me and considered in my medical decision making (see chart for details).  Clinical Course as of Feb 10 699  Sun Feb 11, 2016  0434 Resp: (!) 38 [HM]  0434 BP: 128/64 [HM]  0434 Pulse Rate: 110 [HM]  0553 Lactic Acid, Venous: (!!) 4.49 [HM]  4332 Patient with increased work of breathing and hypoxia at this time.  Now with throughout on the left and bases on the right.  [HM]    Clinical Course User Index [HM] Abigail Butts, PA-C    Patient comes in  febrile with generalized weakness, tachycardia and tachypnea. Concern for sepsis. Sepsis order set initiated and code sepsis called.  Patient abdomen is soft but he complains of significant pain in the right lower quadrant on palpation. Chest x-ray without evidence of pneumonia. Broad-spectrum antibiotics ordered. CT scan pending.  Patient with increasing respiratory effort. Repeat chest x-ray shows interstitial edema and I have concern for ARDS. CT scan continues to pend.  At shift change care transferred to Dr. Randal Buba Who will follow labs, CT scan and proceed with admission.  Final Clinical Impressions(s) / ED Diagnoses   Final diagnoses:  Sepsis, due to unspecified organism ([place])  Diarrhea, unspecified type  Hyperammonemia (HCC)  Thrombocytopenia [facility])    New Prescriptions New Prescriptions   No medications on file     Abigail Butts, PA-C 02/11/16 0701    April Palumbo, MD 02/11/16 [phone]

## 2016-02-11 NOTE — Progress Notes (Signed)
PHARMACY - PHYSICIAN COMMUNICATION CRITICAL VALUE ALERT - BLOOD CULTURE IDENTIFICATION (BCID)  Results for orders placed or performed during the hospital encounter of 02/11/16  Blood Culture ID Panel (Reflexed) (Collected: 02/11/2016  5:15 AM)  Result Value Ref Range   Enterococcus species NOT DETECTED NOT DETECTED   Vancomycin resistance NOT DETECTED NOT DETECTED   Listeria monocytogenes NOT DETECTED NOT DETECTED   Staphylococcus species NOT DETECTED NOT DETECTED   Staphylococcus aureus NOT DETECTED NOT DETECTED   Methicillin resistance NOT DETECTED NOT DETECTED   Streptococcus species NOT DETECTED NOT DETECTED   Streptococcus agalactiae NOT DETECTED NOT DETECTED   Streptococcus pneumoniae NOT DETECTED NOT DETECTED   Streptococcus pyogenes NOT DETECTED NOT DETECTED   Acinetobacter baumannii NOT DETECTED NOT DETECTED   Enterobacteriaceae species DETECTED (A) NOT DETECTED   Enterobacter cloacae complex NOT DETECTED NOT DETECTED   Escherichia coli DETECTED (A) NOT DETECTED   Klebsiella oxytoca NOT DETECTED NOT DETECTED   Klebsiella pneumoniae NOT DETECTED NOT DETECTED   Proteus species NOT DETECTED NOT DETECTED   Serratia marcescens NOT DETECTED NOT DETECTED   Carbapenem resistance NOT DETECTED NOT DETECTED   Haemophilus influenzae NOT DETECTED NOT DETECTED   Neisseria meningitidis NOT DETECTED NOT DETECTED   Pseudomonas aeruginosa NOT DETECTED NOT DETECTED   Candida albicans NOT DETECTED NOT DETECTED   Candida glabrata NOT DETECTED NOT DETECTED   Candida krusei NOT DETECTED NOT DETECTED   Candida parapsilosis NOT DETECTED NOT DETECTED   Candida tropicalis NOT DETECTED NOT DETECTED    Name of physician (or Provider) Contacted: K Schorr NP  Changes to prescribed antibiotics required: Recommend change to Rocephin 2gm q24h will defer to day shift for de-escalation.   Dorrene German 02/11/2016  8:51 PM

## 2016-02-11 NOTE — ED Notes (Signed)
Pt aware that a urine sample is needed Pt given a urinal and informed to alert staff once he is able.

## 2016-02-11 NOTE — ED Notes (Signed)
He is awake, alert and in no distress. His wife and daughter are with him.

## 2016-02-11 NOTE — ED Triage Notes (Signed)
Patient complain of Right lower abdominal pain Since Friday. Patient states he has never had this pain before. Patient has cirrhosis of liver. BS-[id number].

## 2016-02-11 NOTE — Progress Notes (Signed)
Pharmacy Antibiotic Note  Jose Snow is a 45 y.o. male admitted on 02/11/2016 with sepsis.  Patient presented with lower abdominal pain; cirrhosis of the liver. Vancomycin 1gm and Zosyn 3.375gm IV x 1 dose each given in the ED.  Pharmacy has been consulted for Vancomycin and Zosyn dosing.  Plan:  Vancomycin 527m IV q12h  Vancomycin trough goal: 15-20 mcg/ml  Zosyn 3.375gm IV q8h (each dose infused over 4 hrs)  F/U cultures, renal function  Weight: 130 lb 1.1 oz (59 kg)  Temp (24hrs), Avg:98.9 F (37.2 C), Min:98.8 F (37.1 C), Max:98.9 F (37.2 C)   Recent Labs Lab 02/11/16 0515 02/11/16 0549 02/11/16 0624  WBC 5.7  --   --   CREATININE 1.52*  --  1.40*  LATICACIDVEN  --  4.49*  --     Estimated Creatinine Clearance: 54.4 mL/min (by C-G formula based on SCr of 1.4 mg/dL (H)).    No Known Allergies  Antimicrobials this admission: 11/5 vanc >>   11/5 zosyn >>    Dose adjustments this admission:    Microbiology results: 11/5 BCx: sent 11/5 UCx: sent   Thank you for allowing pharmacy to be a part of this patient's care.  PEverette Rank PharmD 02/11/2016 6:32 AM

## 2016-02-11 NOTE — ED Notes (Signed)
He remains in no distress. He has just asked for, and been provided with a bedpan, however, he was unable to pass a b.m. His family remain with him. I will transport shortly after 0900.

## 2016-02-11 NOTE — ED Notes (Signed)
Bed: KW40 Expected date:  Expected time:  Means of arrival:  Comments: 46 abd pain

## 2016-02-12 DIAGNOSIS — K529 Noninfective gastroenteritis and colitis, unspecified: Secondary | ICD-10-CM | POA: Diagnosis not present

## 2016-02-12 DIAGNOSIS — Z8611 Personal history of tuberculosis: Secondary | ICD-10-CM | POA: Diagnosis not present

## 2016-02-12 DIAGNOSIS — K7460 Unspecified cirrhosis of liver: Secondary | ICD-10-CM | POA: Diagnosis not present

## 2016-02-12 DIAGNOSIS — D696 Thrombocytopenia, unspecified: Secondary | ICD-10-CM | POA: Diagnosis not present

## 2016-02-12 DIAGNOSIS — R7881 Bacteremia: Secondary | ICD-10-CM | POA: Diagnosis not present

## 2016-02-12 LAB — CBC
HEMATOCRIT: 37.3 % — AB (ref 39.0–52.0)
Hemoglobin: 12.3 g/dL — ABNORMAL LOW (ref 13.0–17.0)
MCH: 31 pg (ref 26.0–34.0)
MCHC: 33 g/dL (ref 30.0–36.0)
MCV: 94 fL (ref 78.0–100.0)
Platelets: 26 10*3/uL — CL (ref 150–400)
RBC: 3.97 MIL/uL — ABNORMAL LOW (ref 4.22–5.81)
RDW: 15.2 % (ref 11.5–15.5)
WBC: 7.4 10*3/uL (ref 4.0–10.5)

## 2016-02-12 LAB — URINE CULTURE: CULTURE: NO GROWTH

## 2016-02-12 LAB — BASIC METABOLIC PANEL
Anion gap: 2 — ABNORMAL LOW (ref 5–15)
BUN: 29 mg/dL — AB (ref 6–20)
CHLORIDE: 107 mmol/L (ref 101–111)
CO2: 28 mmol/L (ref 22–32)
Calcium: 7.5 mg/dL — ABNORMAL LOW (ref 8.9–10.3)
Creatinine, Ser: 1.19 mg/dL (ref 0.61–1.24)
GFR calc Af Amer: >60 mL/min (ref >60)
GFR calc non Af Amer: >60 mL/min (ref >60)
GLUCOSE: 119 mg/dL — AB (ref 65–99)
POTASSIUM: 4.5 mmol/L (ref 3.5–5.1)
Sodium: 137 mmol/L (ref 135–145)

## 2016-02-12 LAB — HEMOGLOBIN A1C
Hgb A1c MFr Bld: 4.7 % — ABNORMAL LOW (ref 4.8–5.6)
Mean Plasma Glucose: 88 mg/dL

## 2016-02-12 LAB — C DIFFICILE QUICK SCREEN W PCR REFLEX
C DIFFICILE (CDIFF) TOXIN: NEGATIVE
C Diff antigen: NEGATIVE
C Diff interpretation: NOT DETECTED

## 2016-02-12 LAB — ALBUMIN: Albumin: 2.6 g/dL — ABNORMAL LOW (ref 3.5–5.0)

## 2016-02-12 MED ORDER — SODIUM CHLORIDE 0.45 % IV SOLN
INTRAVENOUS | Status: DC
  Administered 2016-02-12 – 2016-02-13 (×2): via INTRAVENOUS

## 2016-02-12 MED ORDER — DEXTROSE 5 % IV SOLN
2.0000 g | INTRAVENOUS | Status: DC
  Administered 2016-02-12 – 2016-02-14 (×3): 2 g via INTRAVENOUS
  Filled 2016-02-12 (×3): qty 2

## 2016-02-12 MED ORDER — METRONIDAZOLE 500 MG PO TABS
500.0000 mg | ORAL_TABLET | Freq: Three times a day (TID) | ORAL | Status: DC
  Administered 2016-02-12 – 2016-02-13 (×4): 500 mg via ORAL
  Filled 2016-02-12 (×4): qty 1

## 2016-02-12 NOTE — Progress Notes (Signed)
PROGRESS NOTE [facility]   Jolene Provost Imai   BHA:[id number] DOB: 1970-06-11  DOA: 02/11/2016 PCP: No PCP Per Patient   Brief Narrative: Jose Snow is a 45 y.o. [place]  male with medical history significant of treated pulmonary tuberculosis and hepatic cirrhosis came in to the hospital with abdominal pain. Admitted for sepsis secondary to colitis, found to have bacteremia Escherichia coli. Patient started on IV fluids and antibiotics.  Subjective: Patient seen and examined this morning doing much better. Tolerating diet, afebrile. The abdominal pain has diminished. No acute events overnight.  Assessment & Plan:  Bacteremia Escherichia coli, secondary to colitis - CT scan showed ascending colitis -Continue IV fluids -Will switch Cipro for ceftriaxone, continue Flagyl for now -Repeat blood cultures -Repeat lactic acid  Hepatic cirrhosis -Has thrombocytopenia, hyperammonemia, hypoalbuminemia and hyperbilirubinemia. -Worsening of total bilirubin to 3.6 since last year, baseline was 0.8.  Acute kidney injury - improving with IV fluids -Baseline creatinine from August 2016 is 1.2 -Likely secondary to dehydration and concurrent infection  -Continue IV fluids for now, in the ED was short of breath although patient BP is borderline low -Monitor for signs of fluid overload.  Chronic hepatitis B -Positive HepBeAg ([place] antigen) in August 2016 indicating chronicity of hepatitis B. -Follow as outpatient.  Hypocalcemia - corrected Ca a 8.6 -Monitor  DVT prophylaxis: SCD Code Status: Full code Family Communication: Plan D/W patient Disposition Plan: Home  Consultants:   None  Procedures:   Noone  Antimicrobials:  Cipro 11/5 OTO  Ceftriaxone 11/6  Flagyl 11/5   Objective: Vitals:   02/11/16 2040 02/12/16 0515 02/12/16 0823 02/12/16 1429  BP: 123/62 (!) 111/58 (!) 112/46 (!) 104/53  Pulse: 98 88 80 76  Resp: (!) 22 20  18   Temp: 98.6 F  (37 C) 97.9 F (36.6 C) 97.6 F (36.4 C) 98 F (36.7 C)  TempSrc: Oral Oral Oral Oral  SpO2: 98% 98% 100% 100%  Weight:      Height:        Intake/Output Summary (Last 24 hours) at 02/12/16 1538 Last data filed at 02/12/16 1300  Gross per 24 hour  Intake             1840 ml  Output                6 ml  Net             1834 ml   Filed Weights   02/11/16 0438 02/11/16 0945  Weight: 59 kg (130 lb 1.1 oz) 58.5 kg (129 lb)    Examination:  General exam: Appears calm and comfortable  Respiratory system: Clear to auscultation. Cardiovascular system: S1 & S2 heard, RRR. No JVD, murmurs, rubs or gallops Gastrointestinal system: Abdomen soft right upper and lower quadrant tenderness. Positive bowel sounds, nondistended Central nervous system: Alert and oriented. No focal neurological deficits. Extremities: No pedal edema. Symmetric, strength 5/5   Skin: No rashes, lesions or ulcers Psychiatry: Judgement and insight appear normal. Mood & affect appropriate.    Data Reviewed: I have personally reviewed following labs and imaging studies  CBC:  Recent Labs Lab 02/11/16 0515 02/11/16 0624 02/11/16 0635 02/12/16 0442  WBC 5.7  --  8.0 7.4  NEUTROABS  --   --  7.0  --   HGB 12.8* 13.3 12.4* 12.3*  HCT 38.1* 39.0 37.1* 37.3*  MCV 93.4  --  93.5 94.0  PLT 15*  --  17* 26*   Basic Metabolic  Panel:  Recent Labs Lab 02/11/16 0515 02/11/16 0624 02/12/16 0442  NA 143 141 137  K 3.6 4.8 4.5  CL 76* 107 107  CO2 17*  --  28  GLUCOSE 199* 123* 119*  BUN 24* 27* 29*  CREATININE 1.52* 1.40* 1.19  CALCIUM 5.3*  --  7.5*   GFR: Estimated Creatinine Clearance: 61.8 mL/min (by C-G formula based on SCr of 1.19 mg/dL). Liver Function Tests:  Recent Labs Lab 02/11/16 0515 02/12/16 0442  AST 36  --   ALT 31  --   ALKPHOS 45  --   BILITOT 3.6*  --   PROT 8.7*  --   ALBUMIN 2.5* 2.6*    Recent Labs Lab 02/11/16 0515  LIPASE 27    Recent Labs Lab 02/11/16 0515    AMMONIA 47*   Coagulation Profile:  Recent Labs Lab 02/11/16 0515 02/11/16 0635 02/11/16 1141  INR 8.36* 2.28 2.19   Cardiac Enzymes: No results for input(s): CKTOTAL, CKMB, CKMBINDEX, TROPONINI in the last 168 hours. BNP (last 3 results) No results for input(s): PROBNP in the last 8760 hours. HbA1C:  Recent Labs  02/11/16 0515  HGBA1C 4.7*   CBG:  Recent Labs Lab 02/11/16 0622  GLUCAP 114*   Lipid Profile: No results for input(s): CHOL, HDL, LDLCALC, TRIG, CHOLHDL, LDLDIRECT in the last 72 hours. Thyroid Function Tests:  Recent Labs  02/11/16 0522  TSH 0.318*   Anemia Panel: No results for input(s): VITAMINB12, FOLATE, FERRITIN, TIBC, IRON, RETICCTPCT in the last 72 hours. Sepsis Labs:  Recent Labs Lab 02/11/16 0549 02/11/16 0737  LATICACIDVEN 4.49* 5.14*    Recent Results (from the past 240 hour(s))  Blood Culture (routine x 2)     Status: Abnormal (Preliminary result)   Collection Time: 02/11/16  5:15 AM  Result Value Ref Range Status   Specimen Description BLOOD LEFT ANTECUBITAL  Final   Special Requests BOTTLES DRAWN AEROBIC AND ANAEROBIC 5ML  Final   Culture  Setup Time   Final    GRAM NEGATIVE RODS IN BOTH AEROBIC AND ANAEROBIC BOTTLES CRITICAL RESULT CALLED TO, READ BACK BY AND VERIFIED WITH: PHARM B GREEN AT 2030 [id number] [place]    Culture (A)  Final    ESCHERICHIA COLI SUSCEPTIBILITIES TO FOLLOW Performed at [facility]    Report Status PENDING  Incomplete  Blood Culture ID Panel (Reflexed)     Status: Abnormal   Collection Time: 02/11/16  5:15 AM  Result Value Ref Range Status   Enterococcus species NOT DETECTED NOT DETECTED Final   Vancomycin resistance NOT DETECTED NOT DETECTED Final   Listeria monocytogenes NOT DETECTED NOT DETECTED Corrected    Comment: CORRECTED ON 11/05 AT 2046: PREVIOUSLY REPORTED AS DETECTED CRITICAL RESULT CALLED TO, READ BACK BY AND VERIFIED WITH: PHARM B GREEN AT [id number] MARTINB    Staphylococcus species NOT DETECTED NOT DETECTED Final   Staphylococcus aureus NOT DETECTED NOT DETECTED Final   Methicillin resistance NOT DETECTED NOT DETECTED Final   Streptococcus species NOT DETECTED NOT DETECTED Final   Streptococcus agalactiae NOT DETECTED NOT DETECTED Final   Streptococcus pneumoniae NOT DETECTED NOT DETECTED Final   Streptococcus pyogenes NOT DETECTED NOT DETECTED Final   Acinetobacter baumannii NOT DETECTED NOT DETECTED Final   Enterobacteriaceae species DETECTED (A) NOT DETECTED Corrected    Comment: CRITICAL RESULT CALLED TO, READ BACK BY AND VERIFIED WITH: PHARM B GREEN AT [phone] MARTINB CORRECTED ON 11/05 AT 2046: PREVIOUSLY REPORTED AS NOT DETECTED  Enterobacter cloacae complex NOT DETECTED NOT DETECTED Final   Escherichia coli DETECTED (A) NOT DETECTED Final    Comment: CRITICAL RESULT CALLED TO, READ BACK BY AND VERIFIED WITH: PHARM B GREEN AT [phone] MARTINB    Klebsiella oxytoca NOT DETECTED NOT DETECTED Final   Klebsiella pneumoniae NOT DETECTED NOT DETECTED Final   Proteus species NOT DETECTED NOT DETECTED Final   Serratia marcescens NOT DETECTED NOT DETECTED Final   Carbapenem resistance NOT DETECTED NOT DETECTED Final   Haemophilus influenzae NOT DETECTED NOT DETECTED Final   Neisseria meningitidis NOT DETECTED NOT DETECTED Final   Pseudomonas aeruginosa NOT DETECTED NOT DETECTED Final   Candida albicans NOT DETECTED NOT DETECTED Final   Candida glabrata NOT DETECTED NOT DETECTED Final   Candida krusei NOT DETECTED NOT DETECTED Final   Candida parapsilosis NOT DETECTED NOT DETECTED Final   Candida tropicalis NOT DETECTED NOT DETECTED Final    Comment: Performed at [facility]  Blood Culture (routine x 2)     Status: None (Preliminary result)   Collection Time: 02/11/16  5:16 AM  Result Value Ref Range Status   Specimen Description BLOOD LEFT FOREARM  Final   Special Requests BOTTLES DRAWN AEROBIC AND ANAEROBIC 5ML   Final   Culture   Final    NO GROWTH 1 DAY Performed at [facility]    Report Status PENDING  Incomplete  Urine culture     Status: None   Collection Time: 02/11/16  6:36 AM  Result Value Ref Range Status   Specimen Description URINE, RANDOM  Final   Special Requests NONE  Final   Culture NO GROWTH Performed at [facility]   Final   Report Status 02/12/2016 FINAL  Final  Gastrointestinal Panel by PCR , Stool     Status: Abnormal (Preliminary result)   Collection Time: 02/12/16 12:11 AM  Result Value Ref Range Status   Campylobacter species NOT DETECTED NOT DETECTED Final   Plesimonas shigelloides NOT DETECTED NOT DETECTED Final   Salmonella species NOT DETECTED NOT DETECTED Final   Yersinia enterocolitica NOT DETECTED NOT DETECTED Final   Vibrio species NOT DETECTED NOT DETECTED Final   Vibrio cholerae NOT DETECTED NOT DETECTED Final   Enteroaggregative E coli (EAEC) NOT DETECTED NOT DETECTED Final   Enteropathogenic E coli (EPEC) PENDING NOT DETECTED Incomplete   Enterotoxigenic E coli (ETEC) NOT DETECTED NOT DETECTED Final   Shiga like toxin producing E coli (STEC) DETECTED (A) NOT DETECTED Final    Comment: RESULT CALLED TO, READ BACK BY AND VERIFIED WITH: NICOLE MCCOY AT 1510 02/12/16 MSS. RESULT CALLED TO, READ BACK BY AND VERIFIED WITH: HUFF,J. RN @1520  ON 11.6.17 BY NMCCOY    E. coli O157 NOT DETECTED NOT DETECTED Final   Shigella/Enteroinvasive E coli (EIEC) NOT DETECTED NOT DETECTED Final   Cryptosporidium NOT DETECTED NOT DETECTED Final   Cyclospora cayetanensis NOT DETECTED NOT DETECTED Final   Entamoeba histolytica NOT DETECTED NOT DETECTED Final   Giardia lamblia NOT DETECTED NOT DETECTED Final   Adenovirus F40/41 NOT DETECTED NOT DETECTED Final   Astrovirus NOT DETECTED NOT DETECTED Final   Norovirus GI/GII NOT DETECTED NOT DETECTED Final   Rotavirus A NOT DETECTED NOT DETECTED Final   Sapovirus (I, II, IV, and V) NOT DETECTED NOT DETECTED  Final  C difficile quick scan w PCR reflex     Status: None   Collection Time: 02/12/16 12:12 AM  Result Value Ref Range Status   C Diff  antigen NEGATIVE NEGATIVE Final   C Diff toxin NEGATIVE NEGATIVE Final   C Diff interpretation No C. difficile detected.  Final      Radiology Studies: Dg Chest 2 View  Result Date: 02/11/2016 CLINICAL DATA:  Right lower quadrant abdominal pain since yesterday. EXAM: CHEST  2 VIEW COMPARISON:  04/10/2013 FINDINGS: Chronic scarring throughout both lungs. Mild chronic blunting of the left lateral costophrenic angle. These abnormalities are unchanged from 04/10/2013. No superimposed acute cardiopulmonary process. No airspace consolidation. No effusions. IMPRESSION: Severe chronic changes.  No acute findings are evident. Electronically Signed   By: Andreas Newport M.D.   On: 02/11/2016 04:52   Ct Abdomen Pelvis W Contrast  Result Date: 02/11/2016 CLINICAL DATA:  RIGHT lower abdominal pain since Friday, history cirrhosis, TB EXAM: CT ABDOMEN AND PELVIS WITH CONTRAST TECHNIQUE: Multidetector CT imaging of the abdomen and pelvis was performed using the standard protocol following bolus administration of intravenous contrast. Sagittal and coronal MPR images reconstructed from axial data set. CONTRAST:  156m ISOVUE-300 IOPAMIDOL (ISOVUE-300) INJECTION 61% IV. No oral contrast was administered. COMPARISON:  11/25/2014 FINDINGS: Lower chest: Scarring LEFT lower lobe with calcified granulomata. Question pulmonary vascular congestion. Hepatobiliary: Small nodular cirrhotic liver without mass. Distended gallbladder. No biliary dilatation. Pancreas: Normal appearance Spleen: Diffusely enlarged, 14.8 x 7.8 x 18.8 cm (volume = 1100 cm^3). No focal mass. Adrenals/Urinary Tract: Adrenal glands normal appearance. Compression of upper pole LEFT kidney by enlarged spleen. Kidneys, ureters and bladder otherwise normal appearance. Stomach/Bowel: Appendix not definitely visualized.  Thickened ascending colon through hepatic flexure compatible with colitis. Similar though less extensive findings at the mid descending colon. Stomach and small bowel loops otherwise unremarkable. Vascular/Lymphatic: Aorta normal caliber without aneurysm. No adenopathy. Portal, superior mesenteric and splenic veins patent. Reproductive: N/A Other: Minimal perihepatic ascites.  No free air.  No hernia. Musculoskeletal: Unremarkable IMPRESSION: Cirrhotic liver with splenomegaly and minimal ascites. Wall thickening of the ascending colon through hepatic flexure and of the mid descending colon compatible with colitis, question due to infection or inflammatory bowel disease. LEFT lower lobe scarring and old granulomatous changes question related to patient's history of TB. Electronically Signed   By: MLavonia DanaM.D.   On: 02/11/2016 07:38   Dg Chest Port 1 View  Result Date: 02/11/2016 CLINICAL DATA:  Bilateral rales EXAM: PORTABLE CHEST 1 VIEW COMPARISON:  02/11/2016 at 04:36 FINDINGS: Mildly increased interstitial prominence, likely representing a degree of interstitial edema. No frank alveolar edema. No effusions. IMPRESSION: Mildly increased prominence of the interstitium, consistent with a degree of interstitial edema. Electronically Signed   By: DAndreas NewportM.D.   On: 02/11/2016 06:57    Scheduled Meds: . cefTRIAXone (ROCEPHIN)  IV  2 g Intravenous Q24H  . metroNIDAZOLE  500 mg Oral Q8H   Continuous Infusions: . sodium chloride Stopped (02/11/16 1535)     LOS: 0 days    EChipper Oman MD [facility] Pager [phone] If 7PM-7AM, please contact night-coverage [email or web address] Password [facility]11/09/2015, 3:38 PM

## 2016-02-12 NOTE — Progress Notes (Signed)
Per Infection Prevention Pt to remain on Enteric Precautions related to results from GI panel

## 2016-02-12 NOTE — Care Management Note (Signed)
Case Management Note  Patient Details  Name: Jose Snow MRN: [id number] Date of Birth: 1970/09/12  Subjective/Objective:45 y/o m admitted w/Colitis. From home.                    Action/Plan:d/c plan home.   Expected Discharge Date:                  Expected Discharge Plan:  Home/Self Care  In-House Referral:  Interpreting Services  Discharge planning Services  CM Consult  Post Acute Care Choice:    Choice offered to:     DME Arranged:    DME Agency:     HH Arranged:    HH Agency:     Status of Service:  In process, will continue to follow  If discussed at Long Length of Stay Meetings, dates discussed:    Additional Comments:  Dessa Phi, RN 02/12/2016, 2:30 PM

## 2016-02-13 DIAGNOSIS — R7881 Bacteremia: Secondary | ICD-10-CM | POA: Diagnosis not present

## 2016-02-13 DIAGNOSIS — K7460 Unspecified cirrhosis of liver: Secondary | ICD-10-CM | POA: Diagnosis not present

## 2016-02-13 DIAGNOSIS — Z8611 Personal history of tuberculosis: Secondary | ICD-10-CM | POA: Diagnosis not present

## 2016-02-13 DIAGNOSIS — K529 Noninfective gastroenteritis and colitis, unspecified: Secondary | ICD-10-CM | POA: Diagnosis not present

## 2016-02-13 LAB — CBC
HCT: 37.2 % — ABNORMAL LOW (ref 39.0–52.0)
Hemoglobin: 12.3 g/dL — ABNORMAL LOW (ref 13.0–17.0)
MCH: 31.1 pg (ref 26.0–34.0)
MCHC: 33.1 g/dL (ref 30.0–36.0)
MCV: 93.9 fL (ref 78.0–100.0)
PLATELETS: 31 10*3/uL — AB (ref 150–400)
RBC: 3.96 MIL/uL — AB (ref 4.22–5.81)
RDW: 14.8 % (ref 11.5–15.5)
WBC: 5.2 10*3/uL (ref 4.0–10.5)

## 2016-02-13 LAB — CULTURE, BLOOD (ROUTINE X 2)

## 2016-02-13 LAB — BASIC METABOLIC PANEL
Anion gap: 6 (ref 5–15)
BUN: 23 mg/dL — ABNORMAL HIGH (ref 6–20)
CO2: 28 mmol/L (ref 22–32)
CREATININE: 1.06 mg/dL (ref 0.61–1.24)
Calcium: 7.9 mg/dL — ABNORMAL LOW (ref 8.9–10.3)
Chloride: 103 mmol/L (ref 101–111)
GFR calc non Af Amer: >60 mL/min (ref >60)
Glucose, Bld: 117 mg/dL — ABNORMAL HIGH (ref 65–99)
Potassium: 4.3 mmol/L (ref 3.5–5.1)
SODIUM: 137 mmol/L (ref 135–145)

## 2016-02-13 LAB — T4, FREE: FREE T4: 1.16 ng/dL — AB (ref 0.61–1.12)

## 2016-02-13 LAB — LACTIC ACID, PLASMA: Lactic Acid, Venous: 1.1 mmol/L (ref 0.5–1.9)

## 2016-02-13 MED ORDER — BOOST / RESOURCE BREEZE PO LIQD
1.0000 | ORAL | Status: DC
  Administered 2016-02-13: 1 via ORAL

## 2016-02-13 MED ORDER — ADULT MULTIVITAMIN W/MINERALS CH
1.0000 | ORAL_TABLET | Freq: Every day | ORAL | Status: DC
  Administered 2016-02-13 – 2016-02-14 (×2): 1 via ORAL
  Filled 2016-02-13 (×2): qty 1

## 2016-02-13 NOTE — Progress Notes (Signed)
PROGRESS NOTE [facility]   Jolene Provost Koury   JKD:[id number] DOB: 07/01/1970  DOA: 02/11/2016 PCP: No PCP Per Patient   Brief Narrative: Jose Snow is a 45 y.o. [place]  male with medical history significant of treated pulmonary tuberculosis and hepatic cirrhosis came in to the hospital with abdominal pain. Admitted for sepsis secondary to colitis, found to have bacteremia Escherichia coli. Patient started on IV fluids and antibiotics.  Subjective: Doing much better, patient denies pain. Tolerating diet well. Denies diarrhea.  Assessment & Plan:  Bacteremia Escherichia coli, secondary to colitis - CT scan showed ascending colitis -Discontinued IV fluids as patient tolerated oral intake -Escherichia coli pansensitive - de-escalate antibiotics to Rocephin -Repeated blood cultures from 11/6- so far no growth less than 24 hour. -Lactic acid normal  Hepatic cirrhosis -Has thrombocytopenia, hyperammonemia, hypoalbuminemia and hyperbilirubinemia. -Worsening of total bilirubin to 3.6 since last year, baseline was 0.8.  Acute kidney injury - resolved creatinine 1.06 -Baseline creatinine from August 2016 is 1.2 -Likely secondary to dehydration and concurrent infection  -Encourage oral intake, follow patient clinically  Chronic hepatitis B -Positive HepBeAg ([place] antigen) in August 2016 indicating chronicity of hepatitis B. -Follow as outpatient.  Hypocalcemia - corrected Ca a 8.6 -Monitor  DVT prophylaxis: SCD Code Status: Full code Family Communication: Plan D/W patient Disposition Plan:  possible home in 2448 hrs. if clinical improving  Consultants:   None  Procedures:   Noone  Antimicrobials:  Cipro 11/5 OTO  Ceftriaxone 11/6  Flagyl 11/5-11/7   Objective: Vitals:   02/12/16 1429 02/12/16 2140 02/13/16 0451 02/13/16 1337  BP: (!) 104/53 120/64 (!) 114/59 (!) 118/55  Pulse: 76 76 83 93  Resp: 18 17 18 17   Temp: 98 F (36.7 C)  98.2 F (36.8 C) 98.3 F (36.8 C) 98.5 F (36.9 C)  TempSrc: Oral Oral Oral Oral  SpO2: 100% 99% 99% 97%  Weight:      Height:        Intake/Output Summary (Last 24 hours) at 02/13/16 1526 Last data filed at 02/13/16 1322  Gross per 24 hour  Intake          2256.25 ml  Output                3 ml  Net          2253.25 ml   Filed Weights   02/11/16 0438 02/11/16 0945  Weight: 59 kg (130 lb 1.1 oz) 58.5 kg (129 lb)    Examination:  General exam: Appears calm and comfortable  Respiratory system: Clear to auscultation. Cardiovascular system: S1 & S2 heard, RRR. No JVD, murmurs, rubs or gallops Gastrointestinal system: Abdomen mild right upper and lower quadrant tenderness. Positive bowel sounds, nondistended Central nervous system: Alert and oriented. No focal neurological deficits. Extremities: No pedal edema.   Data Reviewed: I have personally reviewed following labs and imaging studies  CBC:  Recent Labs Lab 02/11/16 0515 02/11/16 0624 02/11/16 0635 02/12/16 0442 02/13/16 0541  WBC 5.7  --  8.0 7.4 5.2  NEUTROABS  --   --  7.0  --   --   HGB 12.8* 13.3 12.4* 12.3* 12.3*  HCT 38.1* 39.0 37.1* 37.3* 37.2*  MCV 93.4  --  93.5 94.0 93.9  PLT 15*  --  17* 26* 31*   Basic Metabolic Panel:  Recent Labs Lab 02/11/16 0515 02/11/16 0624 02/12/16 0442 02/13/16 0541  NA 143 141 137 137  K 3.6 4.8 4.5 4.3  CL 76* 107 107 103  CO2 17*  --  28 28  GLUCOSE 199* 123* 119* 117*  BUN 24* 27* 29* 23*  CREATININE 1.52* 1.40* 1.19 1.06  CALCIUM 5.3*  --  7.5* 7.9*   GFR: Estimated Creatinine Clearance: 69.3 mL/min (by C-G formula based on SCr of 1.06 mg/dL). Liver Function Tests:  Recent Labs Lab 02/11/16 0515 02/12/16 0442  AST 36  --   ALT 31  --   ALKPHOS 45  --   BILITOT 3.6*  --   PROT 8.7*  --   ALBUMIN 2.5* 2.6*    Recent Labs Lab 02/11/16 0515  LIPASE 27    Recent Labs Lab 02/11/16 0515  AMMONIA 47*   Coagulation Profile:  Recent  Labs Lab 02/11/16 0515 02/11/16 0635 02/11/16 1141  INR 8.36* 2.28 2.19   Cardiac Enzymes: No results for input(s): CKTOTAL, CKMB, CKMBINDEX, TROPONINI in the last 168 hours. BNP (last 3 results) No results for input(s): PROBNP in the last 8760 hours. HbA1C:  Recent Labs  02/11/16 0515  HGBA1C 4.7*   CBG:  Recent Labs Lab 02/11/16 0622  GLUCAP 114*   Lipid Profile: No results for input(s): CHOL, HDL, LDLCALC, TRIG, CHOLHDL, LDLDIRECT in the last 72 hours. Thyroid Function Tests:  Recent Labs  02/11/16 0522 02/13/16 0541  TSH 0.318*  --   FREET4  --  1.16*   Anemia Panel: No results for input(s): VITAMINB12, FOLATE, FERRITIN, TIBC, IRON, RETICCTPCT in the last 72 hours. Sepsis Labs:  Recent Labs Lab 02/11/16 0549 02/11/16 0737 02/13/16 0541  LATICACIDVEN 4.49* 5.14* 1.1    Recent Results (from the past 240 hour(s))  Blood Culture (routine x 2)     Status: Abnormal   Collection Time: 02/11/16  5:15 AM  Result Value Ref Range Status   Specimen Description BLOOD LEFT ANTECUBITAL  Final   Special Requests BOTTLES DRAWN AEROBIC AND ANAEROBIC 5ML  Final   Culture  Setup Time   Final    GRAM NEGATIVE RODS IN BOTH AEROBIC AND ANAEROBIC BOTTLES CRITICAL RESULT CALLED TO, READ BACK BY AND VERIFIED WITH: PHARM B GREEN AT [phone] [place] Performed at [place] (A)  Final   Report Status 02/13/2016 FINAL  Final   Organism ID, Bacteria ESCHERICHIA COLI  Final      Susceptibility   Escherichia coli - MIC*    AMPICILLIN 4 SENSITIVE Sensitive     CEFAZOLIN <=4 SENSITIVE Sensitive     CEFEPIME <=1 SENSITIVE Sensitive     CEFTAZIDIME <=1 SENSITIVE Sensitive     CEFTRIAXONE <=1 SENSITIVE Sensitive     CIPROFLOXACIN <=0.25 SENSITIVE Sensitive     GENTAMICIN <=1 SENSITIVE Sensitive     IMIPENEM <=0.25 SENSITIVE Sensitive     TRIMETH/SULFA <=20 SENSITIVE Sensitive     AMPICILLIN/SULBACTAM <=2 SENSITIVE Sensitive      PIP/TAZO <=4 SENSITIVE Sensitive     Extended ESBL NEGATIVE Sensitive     * ESCHERICHIA COLI  Blood Culture ID Panel (Reflexed)     Status: Abnormal   Collection Time: 02/11/16  5:15 AM  Result Value Ref Range Status   Enterococcus species NOT DETECTED NOT DETECTED Final   Vancomycin resistance NOT DETECTED NOT DETECTED Final   Listeria monocytogenes NOT DETECTED NOT DETECTED Corrected    Comment: CORRECTED ON 11/05 AT 2046: PREVIOUSLY REPORTED AS DETECTED CRITICAL RESULT CALLED TO, READ BACK BY AND VERIFIED WITH: PHARM B GREEN AT [phone] MARTINB  Staphylococcus species NOT DETECTED NOT DETECTED Final   Staphylococcus aureus NOT DETECTED NOT DETECTED Final   Methicillin resistance NOT DETECTED NOT DETECTED Final   Streptococcus species NOT DETECTED NOT DETECTED Final   Streptococcus agalactiae NOT DETECTED NOT DETECTED Final   Streptococcus pneumoniae NOT DETECTED NOT DETECTED Final   Streptococcus pyogenes NOT DETECTED NOT DETECTED Final   Acinetobacter baumannii NOT DETECTED NOT DETECTED Final   Enterobacteriaceae species DETECTED (A) NOT DETECTED Corrected    Comment: CRITICAL RESULT CALLED TO, READ BACK BY AND VERIFIED WITH: PHARM B GREEN AT 2030 [phone] MARTINB CORRECTED ON 11/05 AT 2046: PREVIOUSLY REPORTED AS NOT DETECTED    Enterobacter cloacae complex NOT DETECTED NOT DETECTED Final   Escherichia coli DETECTED (A) NOT DETECTED Final    Comment: CRITICAL RESULT CALLED TO, READ BACK BY AND VERIFIED WITH: PHARM B GREEN AT 2030 [phone] MARTINB    Klebsiella oxytoca NOT DETECTED NOT DETECTED Final   Klebsiella pneumoniae NOT DETECTED NOT DETECTED Final   Proteus species NOT DETECTED NOT DETECTED Final   Serratia marcescens NOT DETECTED NOT DETECTED Final   Carbapenem resistance NOT DETECTED NOT DETECTED Final   Haemophilus influenzae NOT DETECTED NOT DETECTED Final   Neisseria meningitidis NOT DETECTED NOT DETECTED Final   Pseudomonas aeruginosa NOT DETECTED NOT  DETECTED Final   Candida albicans NOT DETECTED NOT DETECTED Final   Candida glabrata NOT DETECTED NOT DETECTED Final   Candida krusei NOT DETECTED NOT DETECTED Final   Candida parapsilosis NOT DETECTED NOT DETECTED Final   Candida tropicalis NOT DETECTED NOT DETECTED Final    Comment: Performed at [facility]  Blood Culture (routine x 2)     Status: None (Preliminary result)   Collection Time: 02/11/16  5:16 AM  Result Value Ref Range Status   Specimen Description BLOOD LEFT FOREARM  Final   Special Requests BOTTLES DRAWN AEROBIC AND ANAEROBIC 5ML  Final   Culture   Final    NO GROWTH 2 DAYS Performed at [facility]    Report Status PENDING  Incomplete  Urine culture     Status: None   Collection Time: 02/11/16  6:36 AM  Result Value Ref Range Status   Specimen Description URINE, RANDOM  Final   Special Requests NONE  Final   Culture NO GROWTH Performed at [facility]   Final   Report Status 02/12/2016 FINAL  Final  Gastrointestinal Panel by PCR , Stool     Status: Abnormal (Preliminary result)   Collection Time: 02/12/16 12:11 AM  Result Value Ref Range Status   Campylobacter species NOT DETECTED NOT DETECTED Final   Plesimonas shigelloides NOT DETECTED NOT DETECTED Final   Salmonella species NOT DETECTED NOT DETECTED Final   Yersinia enterocolitica NOT DETECTED NOT DETECTED Final   Vibrio species NOT DETECTED NOT DETECTED Final   Vibrio cholerae NOT DETECTED NOT DETECTED Final   Enteroaggregative E coli (EAEC) NOT DETECTED NOT DETECTED Final   Enteropathogenic E coli (EPEC) PENDING NOT DETECTED Incomplete   Enterotoxigenic E coli (ETEC) NOT DETECTED NOT DETECTED Final   Shiga like toxin producing E coli (STEC) DETECTED (A) NOT DETECTED Final    Comment: RESULT CALLED TO, READ BACK BY AND VERIFIED WITH: NICOLE MCCOY AT 1510 02/12/16 MSS. RESULT CALLED TO, READ BACK BY AND VERIFIED WITH: HUFF,J. RN @1520  ON 11.6.17 BY NMCCOY    E. coli O157 NOT  DETECTED NOT DETECTED Final   Shigella/Enteroinvasive E coli (EIEC) NOT DETECTED NOT DETECTED Final  Cryptosporidium NOT DETECTED NOT DETECTED Final   Cyclospora cayetanensis NOT DETECTED NOT DETECTED Final   Entamoeba histolytica NOT DETECTED NOT DETECTED Final   Giardia lamblia NOT DETECTED NOT DETECTED Final   Adenovirus F40/41 NOT DETECTED NOT DETECTED Final   Astrovirus NOT DETECTED NOT DETECTED Final   Norovirus GI/GII NOT DETECTED NOT DETECTED Final   Rotavirus A NOT DETECTED NOT DETECTED Final   Sapovirus (I, II, IV, and V) NOT DETECTED NOT DETECTED Final  C difficile quick scan w PCR reflex     Status: None   Collection Time: 02/12/16 12:12 AM  Result Value Ref Range Status   C Diff antigen NEGATIVE NEGATIVE Final   C Diff toxin NEGATIVE NEGATIVE Final   C Diff interpretation No C. difficile detected.  Final  Culture, blood (Routine X 2) w Reflex to ID Panel     Status: None (Preliminary result)   Collection Time: 02/12/16  4:49 PM  Result Value Ref Range Status   Specimen Description BLOOD RIGHT ARM  Final   Special Requests BOTTLES DRAWN AEROBIC AND ANAEROBIC 10CC  Final   Culture   Final    NO GROWTH < 24 HOURS Performed at [facility]    Report Status PENDING  Incomplete  Culture, blood (Routine X 2) w Reflex to ID Panel     Status: None (Preliminary result)   Collection Time: 02/12/16  5:04 PM  Result Value Ref Range Status   Specimen Description BLOOD LEFT HAND  Final   Special Requests BOTTLES DRAWN AEROBIC AND ANAEROBIC 10CC  Final   Culture   Final    NO GROWTH < 24 HOURS Performed at [facility]    Report Status PENDING  Incomplete      Radiology Studies: No results found.  Scheduled Meds: . cefTRIAXone (ROCEPHIN)  IV  2 g Intravenous Q24H  . feeding supplement  1 Container Oral Q24H  . multivitamin with minerals  1 tablet Oral Daily   Continuous Infusions:    LOS: 1 day    Chipper Oman, MD [facility] Pager  [phone]  If 7PM-7AM, please contact night-coverage [email or web address] Password [email or web address] 02/13/2016, 3:26 PM

## 2016-02-13 NOTE — Progress Notes (Signed)
Initial Nutrition Assessment  DOCUMENTATION CODES:   Non-severe (moderate) malnutrition in context of acute illness/injury  INTERVENTION:  - Will order Boost Breeze once/day, this supplement provides 250 kcal and 9 grams of protein - Will order daily multivitamin with minerals. - Continue to encourage PO intakes of meals, supplement, and beverages. - RD will continue to monitor for additional nutrition-related needs.  NUTRITION DIAGNOSIS:   Inadequate oral intake related to acute illness, poor appetite as evidenced by per patient/family report.  GOAL:   Patient will meet greater than or equal to 90% of their needs  MONITOR:   PO intake, Supplement acceptance, Weight trends, Labs, I & O's  REASON FOR ASSESSMENT:   Malnutrition Screening Tool  ASSESSMENT:   45 y.o. [place]  male with medical history significant of treated pulmonary tuberculosis and hepatic cirrhosis came in to the hospital with abdominal pain. Patient story started on Saturday when he started to have diarrhea, this is associated with diffuse abdominal pain especially the right side, denies any nausea or vomiting. Patient tried to take some Imodium but did not help so came into the hospital today because pain is so severe. Was found to have lactic acid of 4.49, acute kidney injury creatinine of 1.5.  Pt seen for MST. BMI indicates normal weight. Per chart review, pt consumed 40% of breakfast, 50% of lunch, and 25% of dinner yesterday and 50% of breakfast this AM. Pt with lunch tray in from of him at RD visit. Daughter, who is at bedside, reports this is the same meal pt had for breakfast: yogurt, toast with butter and jelly, grits, and scrambled eggs. Pt states that he has been trying to eat but that abdominal pain typically worsens with PO intakes. He states that abdominal pain began 2 days PTA and diarrhea also started at that time. Daughter reports that pt has not been needing to use the bathroom as  frequently as he had at home. He states that spicy and highly seasoned items tend to make pain and diarrhea worse and that he has tolerated more bland items better.   Physical assessment shows mild fat and mild to moderate muscle wasting to upper body; lower body not assessed at this time. Per chart review, weight has been stable for the past 1 year.   Notes indicate pt with hx of cirrhosis and hep B and that pt presently with bacteremia E. Coli versus colitis.   Medications reviewed; PRN Zofran.  Labs reviewed;  BUN: 23 mg/dL, Ca: 7.9 mg/dL.    Diet Order:  DIET SOFT Room service appropriate? Yes; Fluid consistency: Thin  Skin:  Reviewed, no issues  Last BM:  11/6  Height:   Ht Readings from Last 1 Encounters:  02/11/16 5' 3"  (1.6 m)    Weight:   Wt Readings from Last 1 Encounters:  02/11/16 129 lb (58.5 kg)    Ideal Body Weight:  56.36 kg  BMI:  Body mass index is 22.85 kg/m.  Estimated Nutritional Needs:   Kcal:  1460-1640 (25-28 kcal/kg)  Protein:  58-70 grams (1-1.2 grams/kg)  Fluid:  1.8-2 L/day  EDUCATION NEEDS:   No education needs identified at this time    Jarome Matin, MS, RD, LDN Inpatient Clinical Dietitian Pager # [phone] After hours/weekend pager # [phone]

## 2016-02-13 NOTE — Care Management Note (Signed)
Case Management Note  Patient Details  Name: Jose Snow MRN: [id number] Date of Birth: 25-Dec-1970  Subjective/Objective:Per dtr-patient has no pcp-provided w/BCBS pcp listing in [place] radius of home address, also provided w/generic pcp listing-dtr voiced understanding & importance of choosing a pcp.Currently no home 02 needed. No further CM needs.                    Action/Plan:d/c plan home.   Expected Discharge Date:                  Expected Discharge Plan:  Home/Self Care  In-House Referral:  Interpreting Services  Discharge planning Services  CM Consult  Post Acute Care Choice:    Choice offered to:     DME Arranged:    DME Agency:     HH Arranged:    HH Agency:     Status of Service:  In process, will continue to follow  If discussed at Long Length of Stay Meetings, dates discussed:    Additional Comments:  MDessa Phi RN 02/13/2016, 11:41 AM

## 2016-02-14 DIAGNOSIS — R17 Unspecified jaundice: Secondary | ICD-10-CM

## 2016-02-14 DIAGNOSIS — Z8611 Personal history of tuberculosis: Secondary | ICD-10-CM

## 2016-02-14 DIAGNOSIS — E440 Moderate protein-calorie malnutrition: Secondary | ICD-10-CM | POA: Insufficient documentation

## 2016-02-14 DIAGNOSIS — K7460 Unspecified cirrhosis of liver: Secondary | ICD-10-CM

## 2016-02-14 DIAGNOSIS — K529 Noninfective gastroenteritis and colitis, unspecified: Secondary | ICD-10-CM | POA: Diagnosis not present

## 2016-02-14 DIAGNOSIS — A498 Other bacterial infections of unspecified site: Secondary | ICD-10-CM

## 2016-02-14 DIAGNOSIS — D696 Thrombocytopenia, unspecified: Secondary | ICD-10-CM

## 2016-02-14 DIAGNOSIS — R7881 Bacteremia: Secondary | ICD-10-CM

## 2016-02-14 LAB — CBC
HEMATOCRIT: 38.1 % — AB (ref 39.0–52.0)
HEMOGLOBIN: 12.7 g/dL — AB (ref 13.0–17.0)
MCH: 30.6 pg (ref 26.0–34.0)
MCHC: 33.3 g/dL (ref 30.0–36.0)
MCV: 91.8 fL (ref 78.0–100.0)
Platelets: 31 10*3/uL — ABNORMAL LOW (ref 150–400)
RBC: 4.15 MIL/uL — AB (ref 4.22–5.81)
RDW: 14.3 % (ref 11.5–15.5)
WBC: 3.5 10*3/uL — AB (ref 4.0–10.5)

## 2016-02-14 LAB — BASIC METABOLIC PANEL
ANION GAP: 5 (ref 5–15)
BUN: 18 mg/dL (ref 6–20)
CHLORIDE: 104 mmol/L (ref 101–111)
CO2: 28 mmol/L (ref 22–32)
Calcium: 8.2 mg/dL — ABNORMAL LOW (ref 8.9–10.3)
Creatinine, Ser: 0.91 mg/dL (ref 0.61–1.24)
GFR calc non Af Amer: >60 mL/min (ref >60)
Glucose, Bld: 100 mg/dL — ABNORMAL HIGH (ref 65–99)
POTASSIUM: 3.9 mmol/L (ref 3.5–5.1)
SODIUM: 137 mmol/L (ref 135–145)

## 2016-02-14 MED ORDER — BOOST / RESOURCE BREEZE PO LIQD: 1.0000 | ORAL | 0 refills | Status: DC

## 2016-02-14 MED ORDER — ADULT MULTIVITAMIN W/MINERALS CH: 1.0000 | ORAL_TABLET | Freq: Every day | ORAL | 0 refills | Status: DC

## 2016-02-14 MED ORDER — SULFAMETHOXAZOLE-TRIMETHOPRIM 800-160 MG PO TABS: 1.0000 | ORAL_TABLET | Freq: Two times a day (BID) | ORAL | 0 refills | Status: AC

## 2016-02-14 MED ORDER — GUAIFENESIN-DM 100-10 MG/5ML PO SYRP: 5.0000 mL | ORAL_SOLUTION | ORAL | 0 refills | Status: DC | PRN

## 2016-02-14 MED ORDER — SULFAMETHOXAZOLE-TRIMETHOPRIM 800-160 MG PO TABS: 1.0000 | ORAL_TABLET | Freq: Two times a day (BID) | ORAL | Status: DC

## 2016-02-14 NOTE — Evaluation (Signed)
Physical Therapy Evaluation Patient Details Name: Jose Snow MRN: [id number] DOB: June 22, 1970 Today's Date: 02/14/2016   History of Present Illness  45 y.o. male admitted with colitis, sepsis, AKI. PMH of TB, cirrhosis, hepatitis B.   Clinical Impression  Pt is independent with mobility, he ambulated 250' without an assistive device, no loss of balance. He stated he'd feel more steady with a cane. From PT standpoint he's ready to DC home.     Follow Up Recommendations No PT follow up    Equipment Recommendations  Cane    Recommendations for Other Services       Precautions / Restrictions Precautions Precautions: None Restrictions Weight Bearing Restrictions: No      Mobility  Bed Mobility               General bed mobility comments: NT - up in chair  Transfers Overall transfer level: Independent                  Ambulation/Gait Ambulation/Gait assistance: Independent Ambulation Distance (Feet): 250 Feet Assistive device: None Gait Pattern/deviations: WFL(Within Functional Limits)   Gait velocity interpretation: Below normal speed for age/gender General Gait Details: steady without assistive device, decr velocity  Stairs            Wheelchair Mobility    Modified Rankin (Stroke Patients Only)       Balance Overall balance assessment: Independent                                           Pertinent Vitals/Pain Pain Assessment: 0-10 Pain Score: 4  Pain Location: lower abdomen Pain Descriptors / Indicators: Pressure Pain Intervention(s): Monitored during session;Patient requesting pain meds-RN notified    Home Living Family/patient expects to be discharged to:: Private residence Living Arrangements: Spouse/significant other;Children Available Help at Discharge: Family   Home Access: Level entry     Home Layout: One level Home Equipment: None      Prior Function Level of Independence: Independent          Comments: works on Psychologist, clinical        Extremity/Trunk Assessment   Upper Extremity Assessment: Overall WFL for tasks assessed           Lower Extremity Assessment: Overall WFL for tasks assessed      Cervical / Trunk Assessment: Normal  Communication   Communication: Interpreter utilized Investment banker, corporate is primary language, pt speaks good basic English, pt's pastor interpreted via facetime, attempted to use hosptital interpreter service but they didn't have [place] interpreter)  Cognition Arousal/Alertness: Awake/alert Behavior During Therapy: WFL for tasks assessed/performed Overall Cognitive Status: Within Functional Limits for tasks assessed                      General Comments      Exercises     Assessment/Plan    PT Assessment Patent does not need any further PT services  PT Problem List            PT Treatment Interventions      PT Goals (Current goals can be found in the Care Plan section)  Acute Rehab PT Goals PT Goal Formulation: All assessment and education complete, DC therapy    Frequency     Barriers to discharge        Co-evaluation  End of Session Equipment Utilized During Treatment: Gait belt Activity Tolerance: Patient tolerated treatment well;No increased pain Patient left: in chair;with call bell/phone within reach;with nursing/sitter in room Nurse Communication: Mobility status         Time: 1132-1201 PT Time Calculation (min) (ACUTE ONLY): 29 min   Charges:   PT Evaluation $PT Eval Low Complexity: 1 Procedure PT Treatments $Gait Training: 8-22 mins   PT G Codes:        Philomena Doheny 02/14/2016, 12:19 PM [phone]

## 2016-02-14 NOTE — Progress Notes (Signed)
Patient and his family given discharge, follow up, and medication instructions, verbalized understanding, IV and telemetry removed, family to transport home

## 2016-02-14 NOTE — ED Notes (Signed)
Blood cultures was drawn when both IV's was placed.

## 2016-02-14 NOTE — Discharge Summary (Signed)
Physician Discharge Summary  Jolene Provost Lindholm KGM:[id number] DOB: 11-03-1970 DOA: 02/11/2016  PCP: No PCP Per Patient  Admit date: 02/11/2016 Discharge date: 02/14/2016  Admitted From: Home Disposition:  Home  Recommendations for Outpatient Follow-up:  1. Follow up with PCP in 1-2 weeks 2. Please obtain BMP/CBC in one week 3. Follow up with Gastroenterology as an outpatient 4. Follow up with Infectious Diseases as an outpatient. 5. Please follow up on the following pending results:  Home Health: No Equipment/Devices: Cane  Discharge Condition: Stable CODE STATUS: Full Diet recommendation: Heart Healthy   Brief/Interim Summary: Jolene Provost Buglungis a 45 y.o.[place] malewith medical history significant of treated pulmonary tuberculosis and hepatic cirrhosis came in to the hospital with abdominal pain. Admitted for sepsis secondary to colitis, found to have bacteremia Escherichia coli. Patient started on IV fluids and antibiotics. He was found to have Shiga Like Toxin Ecoli in his stool as well as an E.Coli Bacteremia. Case was discussed with ID Dr. Tommy Medal who recommended Bactrim DS 1 tablet for a total of 10 days because of concern for HUS. Patient was medcially stable for D/C and is to arrange PCP follow up as he has not chosen PCP as of yet.   Discharge Diagnoses:  Principal Problem:   Colitis Active Problems:   History of TB (tuberculosis)   Cirrhosis (HCC)   Thrombocytopenia (HCC)   Elevated bilirubin   Elevated lactic acid level   Malnutrition of moderate degree  Shiga Like Toxin Escherichia coli Bacteremia, secondary to Ascending Colitis -CT scan showed ascending colitis -Discontinued IV fluids as patient tolerated oral intake -Escherichia coli pansensitive - de-escalated to Bactrim per ID Recc's -Repeated blood cultures from 11/6- so far no growth less than 24 hour. -Lactic acid normal -Case discussed with ID Dr. Dietrich [place] -No bloody diarrhea or bowel  movements per patient -Follow up with PCP as an outpatient  Hepatic Cirrhosis -Has thrombocytopenia, hyperammonemia, hypoalbuminemia and hyperbilirubinemia. -Worsening of total bilirubin to 3.6 since last year, baseline was 0.8. -Will need outpatient Gastroenterology Follow up  Acute kidney injury - resolved creatinine now 0.91 -Baseline creatinine from August 2016 is 1.2 -Was secondary to dehydration and concurrent infection  -Encouraged oral intake, follow patient clinically -Repeat BMP as an outpatient  Chronic hepatitis B -Positive HepBeAg([place] antigen) in August 2016 indicating chronicity of hepatitis B. -Follow as outpatient with Infectious Diseases and GI  Discharge Instructions  Discharge Instructions    Call MD for:  difficulty breathing, headache or visual disturbances    Complete by:  As directed    Call MD for:  persistant dizziness or light-headedness    Complete by:  As directed    Call MD for:  persistant nausea and vomiting    Complete by:  As directed    Diet - low sodium heart healthy    Complete by:  As directed    Discharge instructions    Complete by:  As directed    Follow up with PCP in 1-2 Weeks and Repeat Blood Work with CBC/CMP. Take All medications as prescribed. If symptoms change or worsen please return to the ER for Evaluation.   Increase activity slowly    Complete by:  As directed        Medication List    STOP taking these medications   HYDROcodone-acetaminophen 5-325 MG tablet Commonly known as:  NORCO   omeprazole 40 MG capsule Commonly known as:  PRILOSEC     TAKE these medications   feeding supplement  Liqd Take 1 Container by mouth daily.   guaiFENesin-dextromethorphan 100-10 MG/5ML syrup Commonly known as:  ROBITUSSIN DM Take 5 mLs by mouth every 4 (four) hours as needed for cough.   multivitamin with minerals Tabs tablet Take 1 tablet by mouth daily. Start taking on:  02/15/2016   sulfamethoxazole-trimethoprim  800-160 MG tablet Commonly known as:  BACTRIM DS,SEPTRA DS Take 1 tablet by mouth every 12 (twelve) hours.       No Known Allergies  Consultations:  - Infectious Diseases Dr. Tommy Medal via Phone Consultation.  Procedures/Studies: Dg Chest 2 View  Result Date: 02/11/2016 CLINICAL DATA:  Right lower quadrant abdominal pain since yesterday. EXAM: CHEST  2 VIEW COMPARISON:  04/10/2013 FINDINGS: Chronic scarring throughout both lungs. Mild chronic blunting of the left lateral costophrenic angle. These abnormalities are unchanged from 04/10/2013. No superimposed acute cardiopulmonary process. No airspace consolidation. No effusions. IMPRESSION: Severe chronic changes.  No acute findings are evident. Electronically Signed   By: Andreas Newport M.D.   On: 02/11/2016 04:52   Ct Abdomen Pelvis W Contrast  Result Date: 02/11/2016 CLINICAL DATA:  RIGHT lower abdominal pain since Friday, history cirrhosis, TB EXAM: CT ABDOMEN AND PELVIS WITH CONTRAST TECHNIQUE: Multidetector CT imaging of the abdomen and pelvis was performed using the standard protocol following bolus administration of intravenous contrast. Sagittal and coronal MPR images reconstructed from axial data set. CONTRAST:  154m ISOVUE-300 IOPAMIDOL (ISOVUE-300) INJECTION 61% IV. No oral contrast was administered. COMPARISON:  11/25/2014 FINDINGS: Lower chest: Scarring LEFT lower lobe with calcified granulomata. Question pulmonary vascular congestion. Hepatobiliary: Small nodular cirrhotic liver without mass. Distended gallbladder. No biliary dilatation. Pancreas: Normal appearance Spleen: Diffusely enlarged, 14.8 x 7.8 x 18.8 cm (volume = 1100 cm^3). No focal mass. Adrenals/Urinary Tract: Adrenal glands normal appearance. Compression of upper pole LEFT kidney by enlarged spleen. Kidneys, ureters and bladder otherwise normal appearance. Stomach/Bowel: Appendix not definitely visualized. Thickened ascending colon through hepatic flexure compatible  with colitis. Similar though less extensive findings at the mid descending colon. Stomach and small bowel loops otherwise unremarkable. Vascular/Lymphatic: Aorta normal caliber without aneurysm. No adenopathy. Portal, superior mesenteric and splenic veins patent. Reproductive: N/A Other: Minimal perihepatic ascites.  No free air.  No hernia. Musculoskeletal: Unremarkable IMPRESSION: Cirrhotic liver with splenomegaly and minimal ascites. Wall thickening of the ascending colon through hepatic flexure and of the mid descending colon compatible with colitis, question due to infection or inflammatory bowel disease. LEFT lower lobe scarring and old granulomatous changes question related to patient's history of TB. Electronically Signed   By: MLavonia DanaM.D.   On: 02/11/2016 07:38   Dg Chest Port 1 View  Result Date: 02/11/2016 CLINICAL DATA:  Bilateral rales EXAM: PORTABLE CHEST 1 VIEW COMPARISON:  02/11/2016 at 04:36 FINDINGS: Mildly increased interstitial prominence, likely representing a degree of interstitial edema. No frank alveolar edema. No effusions. IMPRESSION: Mildly increased prominence of the interstitium, consistent with a degree of interstitial edema. Electronically Signed   By: DAndreas NewportM.D.   On: 02/11/2016 06:57     Subjective: Seen and examined at beside and doing well. No bloody diarrhea. States he has mild cough with clear sputum. Weak from laying in bed no other concerns or complaints.   Discharge Exam: Vitals:   02/14/16 0529 02/14/16 1204  BP: 121/64   Pulse: 88   Resp: 16   Temp: 99 F (37.2 C) 97.8 F (36.6 C)   Vitals:   02/13/16 1337 02/13/16 2214 02/14/16 0529 02/14/16 1204  BP: (!) 118/55 (!) 108/54 121/64   Pulse: 93 77 88   Resp: 17 14 16    Temp: 98.5 F (36.9 C) 98.5 F (36.9 C) 99 F (37.2 C) 97.8 F (36.6 C)  TempSrc: Oral Oral Oral Oral  SpO2: 97% 100% 100%   Weight:      Height:       General: Pt is alert, awake, not in acute  distress Cardiovascular: RRR, S1/S2 +, no rubs, no gallops Respiratory: CTA bilaterally, no wheezing, no rhonchi Abdominal: Soft, NT, ND, bowel sounds + Extremities: no edema, no cyanosis  The results of significant diagnostics from this hospitalization (including imaging, microbiology, ancillary and laboratory) are listed below for reference.    Microbiology: Recent Results (from the past 240 hour(s))  Blood Culture (routine x 2)     Status: Abnormal   Collection Time: 02/11/16  5:15 AM  Result Value Ref Range Status   Specimen Description BLOOD LEFT ANTECUBITAL  Final   Special Requests BOTTLES DRAWN AEROBIC AND ANAEROBIC 5ML  Final   Culture  Setup Time   Final    GRAM NEGATIVE RODS IN BOTH AEROBIC AND ANAEROBIC BOTTLES CRITICAL RESULT CALLED TO, READ BACK BY AND VERIFIED WITH: PHARM B GREEN AT 2030 [phone] [place] Performed at [place] (A)  Final   Report Status 02/13/2016 FINAL  Final   Organism ID, Bacteria ESCHERICHIA COLI  Final      Susceptibility   Escherichia coli - MIC*    AMPICILLIN 4 SENSITIVE Sensitive     CEFAZOLIN <=4 SENSITIVE Sensitive     CEFEPIME <=1 SENSITIVE Sensitive     CEFTAZIDIME <=1 SENSITIVE Sensitive     CEFTRIAXONE <=1 SENSITIVE Sensitive     CIPROFLOXACIN <=0.25 SENSITIVE Sensitive     GENTAMICIN <=1 SENSITIVE Sensitive     IMIPENEM <=0.25 SENSITIVE Sensitive     TRIMETH/SULFA <=20 SENSITIVE Sensitive     AMPICILLIN/SULBACTAM <=2 SENSITIVE Sensitive     PIP/TAZO <=4 SENSITIVE Sensitive     Extended ESBL NEGATIVE Sensitive     * ESCHERICHIA COLI  Blood Culture ID Panel (Reflexed)     Status: Abnormal   Collection Time: 02/11/16  5:15 AM  Result Value Ref Range Status   Enterococcus species NOT DETECTED NOT DETECTED Final   Vancomycin resistance NOT DETECTED NOT DETECTED Final   Listeria monocytogenes NOT DETECTED NOT DETECTED Corrected    Comment: CORRECTED ON 11/05 AT 2046: PREVIOUSLY REPORTED AS  DETECTED CRITICAL RESULT CALLED TO, READ BACK BY AND VERIFIED WITH: PHARM B GREEN AT 2030 [phone] MARTINB   Staphylococcus species NOT DETECTED NOT DETECTED Final   Staphylococcus aureus NOT DETECTED NOT DETECTED Final   Methicillin resistance NOT DETECTED NOT DETECTED Final   Streptococcus species NOT DETECTED NOT DETECTED Final   Streptococcus agalactiae NOT DETECTED NOT DETECTED Final   Streptococcus pneumoniae NOT DETECTED NOT DETECTED Final   Streptococcus pyogenes NOT DETECTED NOT DETECTED Final   Acinetobacter baumannii NOT DETECTED NOT DETECTED Final   Enterobacteriaceae species DETECTED (A) NOT DETECTED Corrected    Comment: CRITICAL RESULT CALLED TO, READ BACK BY AND VERIFIED WITH: PHARM B GREEN AT [phone] MARTINB CORRECTED ON 11/05 AT 2046: PREVIOUSLY REPORTED AS NOT DETECTED    Enterobacter cloacae complex NOT DETECTED NOT DETECTED Final   Escherichia coli DETECTED (A) NOT DETECTED Final    Comment: CRITICAL RESULT CALLED TO, READ BACK BY AND VERIFIED WITH: PHARM B GREEN AT 2030 [phone] MARTINB  Klebsiella oxytoca NOT DETECTED NOT DETECTED Final   Klebsiella pneumoniae NOT DETECTED NOT DETECTED Final   Proteus species NOT DETECTED NOT DETECTED Final   Serratia marcescens NOT DETECTED NOT DETECTED Final   Carbapenem resistance NOT DETECTED NOT DETECTED Final   Haemophilus influenzae NOT DETECTED NOT DETECTED Final   Neisseria meningitidis NOT DETECTED NOT DETECTED Final   Pseudomonas aeruginosa NOT DETECTED NOT DETECTED Final   Candida albicans NOT DETECTED NOT DETECTED Final   Candida glabrata NOT DETECTED NOT DETECTED Final   Candida krusei NOT DETECTED NOT DETECTED Final   Candida parapsilosis NOT DETECTED NOT DETECTED Final   Candida tropicalis NOT DETECTED NOT DETECTED Final    Comment: Performed at [facility]  Blood Culture (routine x 2)     Status: None (Preliminary result)   Collection Time: 02/11/16  5:16 AM  Result Value Ref Range Status    Specimen Description BLOOD LEFT FOREARM  Final   Special Requests BOTTLES DRAWN AEROBIC AND ANAEROBIC 5ML  Final   Culture   Final    NO GROWTH 3 DAYS Performed at [facility]    Report Status PENDING  Incomplete  Urine culture     Status: None   Collection Time: 02/11/16  6:36 AM  Result Value Ref Range Status   Specimen Description URINE, RANDOM  Final   Special Requests NONE  Final   Culture NO GROWTH Performed at [facility]   Final   Report Status 02/12/2016 FINAL  Final  Gastrointestinal Panel by PCR , Stool     Status: Abnormal (Preliminary result)   Collection Time: 02/12/16 12:11 AM  Result Value Ref Range Status   Campylobacter species NOT DETECTED NOT DETECTED Final   Plesimonas shigelloides NOT DETECTED NOT DETECTED Final   Salmonella species NOT DETECTED NOT DETECTED Final   Yersinia enterocolitica NOT DETECTED NOT DETECTED Final   Vibrio species NOT DETECTED NOT DETECTED Final   Vibrio cholerae NOT DETECTED NOT DETECTED Final   Enteroaggregative E coli (EAEC) NOT DETECTED NOT DETECTED Final   Enteropathogenic E coli (EPEC) PENDING NOT DETECTED Incomplete   Enterotoxigenic E coli (ETEC) NOT DETECTED NOT DETECTED Final   Shiga like toxin producing E coli (STEC) DETECTED (A) NOT DETECTED Final    Comment: RESULT CALLED TO, READ BACK BY AND VERIFIED WITH: NICOLE MCCOY AT 1510 02/12/16 MSS. RESULT CALLED TO, READ BACK BY AND VERIFIED WITH: HUFF,J. RN @1520  ON 11.6.17 BY NMCCOY    E. coli O157 NOT DETECTED NOT DETECTED Final   Shigella/Enteroinvasive E coli (EIEC) NOT DETECTED NOT DETECTED Final   Cryptosporidium NOT DETECTED NOT DETECTED Final   Cyclospora cayetanensis NOT DETECTED NOT DETECTED Final   Entamoeba histolytica NOT DETECTED NOT DETECTED Final   Giardia lamblia NOT DETECTED NOT DETECTED Final   Adenovirus F40/41 NOT DETECTED NOT DETECTED Final   Astrovirus NOT DETECTED NOT DETECTED Final   Norovirus GI/GII NOT DETECTED NOT DETECTED Final    Rotavirus A NOT DETECTED NOT DETECTED Final   Sapovirus (I, II, IV, and V) NOT DETECTED NOT DETECTED Final  C difficile quick scan w PCR reflex     Status: None   Collection Time: 02/12/16 12:12 AM  Result Value Ref Range Status   C Diff antigen NEGATIVE NEGATIVE Final   C Diff toxin NEGATIVE NEGATIVE Final   C Diff interpretation No C. difficile detected.  Final  Culture, blood (Routine X 2) w Reflex to ID Panel     Status: None (Preliminary  result)   Collection Time: 02/12/16  4:49 PM  Result Value Ref Range Status   Specimen Description BLOOD RIGHT ARM  Final   Special Requests BOTTLES DRAWN AEROBIC AND ANAEROBIC 10CC  Final   Culture   Final    NO GROWTH 2 DAYS Performed at [facility]    Report Status PENDING  Incomplete  Culture, blood (Routine X 2) w Reflex to ID Panel     Status: None (Preliminary result)   Collection Time: 02/12/16  5:04 PM  Result Value Ref Range Status   Specimen Description BLOOD LEFT HAND  Final   Special Requests BOTTLES DRAWN AEROBIC AND ANAEROBIC 10CC  Final   Culture   Final    NO GROWTH 2 DAYS Performed at [facility]    Report Status PENDING  Incomplete    Labs: BNP (last 3 results) No results for input(s): BNP in the last 8760 hours. Basic Metabolic Panel:  Recent Labs Lab 02/11/16 0515 02/11/16 0624 02/12/16 0442 02/13/16 0541 02/14/16 0448  NA 143 141 137 137 137  K 3.6 4.8 4.5 4.3 3.9  CL 76* 107 107 103 104  CO2 17*  --  28 28 28   GLUCOSE 199* 123* 119* 117* 100*  BUN 24* 27* 29* 23* 18  CREATININE 1.52* 1.40* 1.19 1.06 0.91  CALCIUM 5.3*  --  7.5* 7.9* 8.2*   Liver Function Tests:  Recent Labs Lab 02/11/16 0515 02/12/16 0442  AST 36  --   ALT 31  --   ALKPHOS 45  --   BILITOT 3.6*  --   PROT 8.7*  --   ALBUMIN 2.5* 2.6*    Recent Labs Lab 02/11/16 0515  LIPASE 27    Recent Labs Lab 02/11/16 0515  AMMONIA 47*   CBC:  Recent Labs Lab 02/11/16 0515 02/11/16 0624 02/11/16 0635  02/12/16 0442 02/13/16 0541 02/14/16 0448  WBC 5.7  --  8.0 7.4 5.2 3.5*  NEUTROABS  --   --  7.0  --   --   --   HGB 12.8* 13.3 12.4* 12.3* 12.3* 12.7*  HCT 38.1* 39.0 37.1* 37.3* 37.2* 38.1*  MCV 93.4  --  93.5 94.0 93.9 91.8  PLT 15*  --  17* 26* 31* 31*   Cardiac Enzymes: No results for input(s): CKTOTAL, CKMB, CKMBINDEX, TROPONINI in the last 168 hours. BNP: Invalid input(s): POCBNP CBG:  Recent Labs Lab 02/11/16 0622  GLUCAP 114*   D-Dimer No results for input(s): DDIMER in the last 72 hours. Hgb A1c No results for input(s): HGBA1C in the last 72 hours. Lipid Profile No results for input(s): CHOL, HDL, LDLCALC, TRIG, CHOLHDL, LDLDIRECT in the last 72 hours. Thyroid function studies No results for input(s): TSH, T4TOTAL, T3FREE, THYROIDAB in the last 72 hours.  Invalid input(s): FREET3 Anemia work up No results for input(s): VITAMINB12, FOLATE, FERRITIN, TIBC, IRON, RETICCTPCT in the last 72 hours. Urinalysis    Component Value Date/Time   COLORURINE AMBER (A) 02/11/2016 0636   APPEARANCEUR CLEAR 02/11/2016 0636   LABSPEC 1.022 02/11/2016 0636   PHURINE 6.0 02/11/2016 0636   GLUCOSEU NEGATIVE 02/11/2016 0636   HGBUR NEGATIVE 02/11/2016 0636   BILIRUBINUR NEGATIVE 02/11/2016 0636   KETONESUR NEGATIVE 02/11/2016 0636   PROTEINUR NEGATIVE 02/11/2016 0636   UROBILINOGEN 1.0 11/25/2014 1440   NITRITE NEGATIVE 02/11/2016 0636   LEUKOCYTESUR NEGATIVE 02/11/2016 0636   Sepsis Labs Invalid input(s): PROCALCITONIN,  WBC,  LACTICIDVEN Microbiology Recent Results (from the past 240 hour(s))  Blood Culture (routine x 2)     Status: Abnormal   Collection Time: 02/11/16  5:15 AM  Result Value Ref Range Status   Specimen Description BLOOD LEFT ANTECUBITAL  Final   Special Requests BOTTLES DRAWN AEROBIC AND ANAEROBIC 5ML  Final   Culture  Setup Time   Final    GRAM NEGATIVE RODS IN BOTH AEROBIC AND ANAEROBIC BOTTLES CRITICAL RESULT CALLED TO, READ BACK BY AND  VERIFIED WITH: PHARM B GREEN AT 2030 [phone] [place] Performed at [place] (A)  Final   Report Status 02/13/2016 FINAL  Final   Organism ID, Bacteria ESCHERICHIA COLI  Final      Susceptibility   Escherichia coli - MIC*    AMPICILLIN 4 SENSITIVE Sensitive     CEFAZOLIN <=4 SENSITIVE Sensitive     CEFEPIME <=1 SENSITIVE Sensitive     CEFTAZIDIME <=1 SENSITIVE Sensitive     CEFTRIAXONE <=1 SENSITIVE Sensitive     CIPROFLOXACIN <=0.25 SENSITIVE Sensitive     GENTAMICIN <=1 SENSITIVE Sensitive     IMIPENEM <=0.25 SENSITIVE Sensitive     TRIMETH/SULFA <=20 SENSITIVE Sensitive     AMPICILLIN/SULBACTAM <=2 SENSITIVE Sensitive     PIP/TAZO <=4 SENSITIVE Sensitive     Extended ESBL NEGATIVE Sensitive     * ESCHERICHIA COLI  Blood Culture ID Panel (Reflexed)     Status: Abnormal   Collection Time: 02/11/16  5:15 AM  Result Value Ref Range Status   Enterococcus species NOT DETECTED NOT DETECTED Final   Vancomycin resistance NOT DETECTED NOT DETECTED Final   Listeria monocytogenes NOT DETECTED NOT DETECTED Corrected    Comment: CORRECTED ON 11/05 AT 2046: PREVIOUSLY REPORTED AS DETECTED CRITICAL RESULT CALLED TO, READ BACK BY AND VERIFIED WITH: PHARM B GREEN AT 2030 [phone] MARTINB   Staphylococcus species NOT DETECTED NOT DETECTED Final   Staphylococcus aureus NOT DETECTED NOT DETECTED Final   Methicillin resistance NOT DETECTED NOT DETECTED Final   Streptococcus species NOT DETECTED NOT DETECTED Final   Streptococcus agalactiae NOT DETECTED NOT DETECTED Final   Streptococcus pneumoniae NOT DETECTED NOT DETECTED Final   Streptococcus pyogenes NOT DETECTED NOT DETECTED Final   Acinetobacter baumannii NOT DETECTED NOT DETECTED Final   Enterobacteriaceae species DETECTED (A) NOT DETECTED Corrected    Comment: CRITICAL RESULT CALLED TO, READ BACK BY AND VERIFIED WITH: PHARM B GREEN AT 2030 [phone] MARTINB CORRECTED ON 11/05 AT 2046: PREVIOUSLY  REPORTED AS NOT DETECTED    Enterobacter cloacae complex NOT DETECTED NOT DETECTED Final   Escherichia coli DETECTED (A) NOT DETECTED Final    Comment: CRITICAL RESULT CALLED TO, READ BACK BY AND VERIFIED WITH: PHARM B GREEN AT 2030 [phone] MARTINB    Klebsiella oxytoca NOT DETECTED NOT DETECTED Final   Klebsiella pneumoniae NOT DETECTED NOT DETECTED Final   Proteus species NOT DETECTED NOT DETECTED Final   Serratia marcescens NOT DETECTED NOT DETECTED Final   Carbapenem resistance NOT DETECTED NOT DETECTED Final   Haemophilus influenzae NOT DETECTED NOT DETECTED Final   Neisseria meningitidis NOT DETECTED NOT DETECTED Final   Pseudomonas aeruginosa NOT DETECTED NOT DETECTED Final   Candida albicans NOT DETECTED NOT DETECTED Final   Candida glabrata NOT DETECTED NOT DETECTED Final   Candida krusei NOT DETECTED NOT DETECTED Final   Candida parapsilosis NOT DETECTED NOT DETECTED Final   Candida tropicalis NOT DETECTED NOT DETECTED Final    Comment: Performed at [facility]  Blood Culture (routine x 2)  Status: None (Preliminary result)   Collection Time: 02/11/16  5:16 AM  Result Value Ref Range Status   Specimen Description BLOOD LEFT FOREARM  Final   Special Requests BOTTLES DRAWN AEROBIC AND ANAEROBIC 5ML  Final   Culture   Final    NO GROWTH 3 DAYS Performed at [facility]    Report Status PENDING  Incomplete  Urine culture     Status: None   Collection Time: 02/11/16  6:36 AM  Result Value Ref Range Status   Specimen Description URINE, RANDOM  Final   Special Requests NONE  Final   Culture NO GROWTH Performed at [facility]   Final   Report Status 02/12/2016 FINAL  Final  Gastrointestinal Panel by PCR , Stool     Status: Abnormal (Preliminary result)   Collection Time: 02/12/16 12:11 AM  Result Value Ref Range Status   Campylobacter species NOT DETECTED NOT DETECTED Final   Plesimonas shigelloides NOT DETECTED NOT DETECTED Final   Salmonella  species NOT DETECTED NOT DETECTED Final   Yersinia enterocolitica NOT DETECTED NOT DETECTED Final   Vibrio species NOT DETECTED NOT DETECTED Final   Vibrio cholerae NOT DETECTED NOT DETECTED Final   Enteroaggregative E coli (EAEC) NOT DETECTED NOT DETECTED Final   Enteropathogenic E coli (EPEC) PENDING NOT DETECTED Incomplete   Enterotoxigenic E coli (ETEC) NOT DETECTED NOT DETECTED Final   Shiga like toxin producing E coli (STEC) DETECTED (A) NOT DETECTED Final    Comment: RESULT CALLED TO, READ BACK BY AND VERIFIED WITH: NICOLE MCCOY AT 1510 02/12/16 MSS. RESULT CALLED TO, READ BACK BY AND VERIFIED WITH: HUFF,J. RN @1520  ON 11.6.17 BY NMCCOY    E. coli O157 NOT DETECTED NOT DETECTED Final   Shigella/Enteroinvasive E coli (EIEC) NOT DETECTED NOT DETECTED Final   Cryptosporidium NOT DETECTED NOT DETECTED Final   Cyclospora cayetanensis NOT DETECTED NOT DETECTED Final   Entamoeba histolytica NOT DETECTED NOT DETECTED Final   Giardia lamblia NOT DETECTED NOT DETECTED Final   Adenovirus F40/41 NOT DETECTED NOT DETECTED Final   Astrovirus NOT DETECTED NOT DETECTED Final   Norovirus GI/GII NOT DETECTED NOT DETECTED Final   Rotavirus A NOT DETECTED NOT DETECTED Final   Sapovirus (I, II, IV, and V) NOT DETECTED NOT DETECTED Final  C difficile quick scan w PCR reflex     Status: None   Collection Time: 02/12/16 12:12 AM  Result Value Ref Range Status   C Diff antigen NEGATIVE NEGATIVE Final   C Diff toxin NEGATIVE NEGATIVE Final   C Diff interpretation No C. difficile detected.  Final  Culture, blood (Routine X 2) w Reflex to ID Panel     Status: None (Preliminary result)   Collection Time: 02/12/16  4:49 PM  Result Value Ref Range Status   Specimen Description BLOOD RIGHT ARM  Final   Special Requests BOTTLES DRAWN AEROBIC AND ANAEROBIC 10CC  Final   Culture   Final    NO GROWTH 2 DAYS Performed at [facility]    Report Status PENDING  Incomplete  Culture, blood (Routine X 2)  w Reflex to ID Panel     Status: None (Preliminary result)   Collection Time: 02/12/16  5:04 PM  Result Value Ref Range Status   Specimen Description BLOOD LEFT HAND  Final   Special Requests BOTTLES DRAWN AEROBIC AND ANAEROBIC 10CC  Final   Culture   Final    NO GROWTH 2 DAYS Performed at [facility]  Report Status PENDING  Incomplete   Time coordinating discharge: Over 30 minutes  SIGNED:  Kerney Elbe, DO [facility] 02/14/2016, 7:54 PM Pager [phone]  If 7PM-7AM, please contact night-coverage [email or web address] Password [email or web address]

## 2016-02-14 NOTE — Care Management Note (Signed)
Case Management Note  Patient Details  Name: Mac Dowdell MRN: [id number] Date of Birth: [birth year]  Subjective/Objective:patient already d/c. Noted PT recc cane-no order. I have left vm w/dtr on c# to purchase a cane from [place], [facility],or any dme supply store.MD notified.                    Action/Plan:d/c home.   Expected Discharge Date:                  Expected Discharge Plan:  Home/Self Care  In-House Referral:  Interpreting Services  Discharge planning Services  CM Consult  Post Acute Care Choice:    Choice offered to:     DME Arranged:    DME Agency:     HH Arranged:    HH Agency:     Status of Service:  Completed, signed off  If discussed at [employer] of Stay Meetings, dates discussed:    Additional Comments:  Dessa Phi, RN 02/14/2016, 2:05 PM

## 2016-02-16 LAB — CULTURE, BLOOD (ROUTINE X 2): Culture: NO GROWTH

## 2016-02-17 LAB — CULTURE, BLOOD (ROUTINE X 2)
Culture: NO GROWTH
Culture: NO GROWTH

## 2016-03-05 LAB — GASTROINTESTINAL PANEL BY PCR, STOOL (REPLACES STOOL CULTURE)
ASTROVIRUS: NOT DETECTED
Adenovirus F40/41: NOT DETECTED
CYCLOSPORA CAYETANENSIS: NOT DETECTED
Campylobacter species: NOT DETECTED
Cryptosporidium: NOT DETECTED
E. COLI O157: NOT DETECTED
ENTAMOEBA HISTOLYTICA: NOT DETECTED
Enteroaggregative E coli (EAEC): NOT DETECTED
Enteropathogenic E coli (EPEC): NOT DETECTED
Enterotoxigenic E coli (ETEC): NOT DETECTED
GIARDIA LAMBLIA: NOT DETECTED
Norovirus GI/GII: NOT DETECTED
Plesimonas shigelloides: NOT DETECTED
Rotavirus A: NOT DETECTED
SALMONELLA SPECIES: NOT DETECTED
SAPOVIRUS (I, II, IV, AND V): NOT DETECTED
SHIGELLA/ENTEROINVASIVE E COLI (EIEC): NOT DETECTED
Shiga like toxin producing E coli (STEC): DETECTED — AB
VIBRIO CHOLERAE: NOT DETECTED
VIBRIO SPECIES: NOT DETECTED
Yersinia enterocolitica: NOT DETECTED

## 2016-03-05 LAB — MISCELLANEOUS TEST

## 2016-07-22 ENCOUNTER — Ambulatory Visit (HOSPITAL_COMMUNITY)
Admission: EM | Admit: 2016-07-22 | Discharge: 2016-07-22 | Disposition: A | Discharge status: Home / Self Care | Payer: PRIVATE HEALTH INSURANCE

## 2016-07-22 ENCOUNTER — Encounter (HOSPITAL_COMMUNITY): Payer: Self-pay | Admitting: Emergency Medicine

## 2016-07-22 DIAGNOSIS — H1132 Conjunctival hemorrhage, left eye: Secondary | ICD-10-CM

## 2016-07-22 NOTE — ED Triage Notes (Signed)
PT's left eye is red and watery. PT reports no pain or itchy. PT reports vision is a little blurry in affected eye

## 2016-11-03 ENCOUNTER — Ambulatory Visit (HOSPITAL_COMMUNITY)
Admission: EM | Admit: 2016-11-03 | Discharge: 2016-11-03 | Disposition: A | Discharge status: Home / Self Care | Payer: PRIVATE HEALTH INSURANCE | Attending: Internal Medicine | Admitting: Internal Medicine

## 2016-11-03 ENCOUNTER — Encounter (HOSPITAL_COMMUNITY): Payer: Self-pay | Admitting: Emergency Medicine

## 2016-11-03 ENCOUNTER — Ambulatory Visit (INDEPENDENT_AMBULATORY_CARE_PROVIDER_SITE_OTHER): Payer: PRIVATE HEALTH INSURANCE

## 2016-11-03 DIAGNOSIS — M549 Dorsalgia, unspecified: Secondary | ICD-10-CM

## 2016-11-03 DIAGNOSIS — J209 Acute bronchitis, unspecified: Secondary | ICD-10-CM

## 2016-11-03 DIAGNOSIS — R05 Cough: Secondary | ICD-10-CM

## 2016-11-03 MED ORDER — ALBUTEROL SULFATE HFA 108 (90 BASE) MCG/ACT IN AERS: 1.0000 | INHALATION_SPRAY | RESPIRATORY_TRACT | 0 refills | Status: DC | PRN

## 2016-11-03 MED ORDER — IPRATROPIUM-ALBUTEROL 0.5-2.5 (3) MG/3ML IN SOLN
3.0000 mL | Freq: Once | RESPIRATORY_TRACT | Status: AC
  Administered 2016-11-03: 3 mL via RESPIRATORY_TRACT

## 2016-11-03 MED ORDER — IPRATROPIUM-ALBUTEROL 0.5-2.5 (3) MG/3ML IN SOLN
RESPIRATORY_TRACT | Status: AC
  Filled 2016-11-03: qty 3

## 2016-11-03 MED ORDER — GUAIFENESIN-DM 100-10 MG/5ML PO SYRP: 5.0000 mL | ORAL_SOLUTION | ORAL | 0 refills | Status: DC | PRN

## 2016-11-03 MED ORDER — PREDNISONE 50 MG PO TABS: 50.0000 mg | ORAL_TABLET | Freq: Every day | ORAL | 0 refills | Status: DC

## 2016-11-03 NOTE — Discharge Instructions (Addendum)
Anticipate gradual improvement in cough and discomfort over the next several days. Push fluids and rest. Chest x-ray at the urgent care today did not show any new changes, no pneumonia. Prescriptions for prednisone (steroid), for cough syrup, and for albuterol inhaler, to help with cough, were sent to the pharmacy.  Recheck for new fever >100.5, increasing phlegm production/nasal discharge, or if not starting to improve in a few days.

## 2016-11-03 NOTE — ED Triage Notes (Signed)
Cough started on Wednesday night.  Fever started Wednesday morning.  Patient has sore throat, sinus drainage.  Temperature was not taken at home

## 2016-11-03 NOTE — ED Provider Notes (Signed)
[facility]    CSN: [id number] Arrival date & time: 11/03/16  1207     History   Chief Complaint Chief Complaint  Patient presents with  . Cough    HPI Jose Snow is a 46 y.o. male. 3 day history of productive cough and fever. Some runny nose, some sore throat. Not really achy. Does have some diarrhea. Today noticed pain in the left upper back with cough. Quit smoking in 1992.    HPI  Past Medical History:  Diagnosis Date  . 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     Patient Active Problem List   Diagnosis Date Noted  . Malnutrition of moderate degree 02/14/2016  . Colitis 02/11/2016  . Elevated lactic acid level 02/11/2016  . Hepatitis B 02/17/2015  . Elevated bilirubin   . Nausea with vomiting   . Right lower quadrant abdominal pain   . Cirrhosis ([place]) 11/25/2014  . Thrombocytopenia ([place]) 11/25/2014  . Abdominal pain 11/25/2014  . History of TB (tuberculosis) 07/16/2010  . GASTROESOPHAGEAL REFLUX DISEASE, MILD 01/05/2008  . BACK PAIN, LUMBAR 11/02/2007    Past Surgical History:  Procedure Laterality Date  . COMPLEX WOUND CLOSURE Right 12/15/2015   Procedure: COMPLEX WOUND CLOSURE;  Surgeon: Leanora Cover, MD;  Location: [place];  Service: Orthopedics;  Laterality: Right;  . I&D EXTREMITY Right 12/15/2015   Procedure: IRRIGATION AND DEBRIDEMENT AND REVISION AMPUTATION RIGHT RING FINGER;  Surgeon: Leanora Cover, MD;  Location: [place];  Service: Orthopedics;  Laterality: Right;       Home Medications    Prior to Admission medications   Medication Sig Start Date End Date Taking? Authorizing Provider  albuterol (PROVENTIL HFA;VENTOLIN HFA) 108 (90 Base) MCG/ACT inhaler Inhale 1-2 puffs into the lungs every 4 (four) hours as needed for wheezing or shortness of breath. 11/03/16   Sherlene Shams, MD  feeding supplement (BOOST / RESOURCE BREEZE) LIQD Take 1 Container by mouth daily. 02/14/16   Sheikh, Omair Latif, DO    guaiFENesin-dextromethorphan (ROBITUSSIN DM) 100-10 MG/5ML syrup Take 5 mLs by mouth every 4 (four) hours as needed for cough. 11/03/16   Sherlene Shams, MD  Multiple Vitamin (MULTIVITAMIN WITH MINERALS) TABS tablet Take 1 tablet by mouth daily. 02/15/16   Sheikh, Omair Latif, DO  predniSONE (DELTASONE) 50 MG tablet Take 1 tablet (50 mg total) by mouth daily. 11/03/16   Sherlene Shams, MD    Family History No family history on file.  Social History Social History  Substance Use Topics  . Smoking status: Former Smoker    Packs/day: 0.50    Years: 8.00    Types: Cigarettes    Quit date: 04/08/1990  . Smokeless tobacco: Never Used  . Alcohol use No     Allergies   Patient has no known allergies.   Review of Systems Review of Systems  All other systems reviewed and are negative.    Physical Exam Triage Vital Signs ED Triage Vitals  Enc Vitals Group     BP 11/03/16 1235 118/68     Pulse Rate 11/03/16 1235 94     Resp 11/03/16 1235 18     Temp 11/03/16 1235 99.3 F (37.4 C)     Temp Source 11/03/16 1235 Oral     SpO2 11/03/16 1235 97 %     Weight --      Height --      Pain Score 11/03/16 1233 8  Pain Loc --    Updated Vital Signs BP 118/68 (BP Location: Left Arm)   Pulse 94   Temp 99.3 F (37.4 C) (Oral)   Resp 18   SpO2 97%   Physical Exam  Constitutional: He is oriented to person, place, and time. No distress.  Alert, nicely groomed  HENT:  Head: Atraumatic.  The right TM is dull and scarred, opaque with erythema. The canal is slightly excoriated. The left TM is moderately dull, no erythema Marked nasal congestion on the right, moderate on the left. Posterior pharynx is injected, slightly swollen.  Eyes:  Conjugate gaze, no eye redness/drainage  Neck: Neck supple.  Cardiovascular: Normal rate and regular rhythm.   Pulmonary/Chest: No respiratory distress.  Reproducible crackles in the left upper back on exam, otherwise lungs are clear, no wheezing.  Good air movement.  Abdominal: He exhibits no distension.  Musculoskeletal: Normal range of motion.  Neurological: He is alert and oriented to person, place, and time.  Skin: Skin is warm and dry.  No cyanosis  Nursing note and vitals reviewed.    UC Treatments / Results   Radiology Dg Chest 2 View  Result Date: 11/03/2016 CLINICAL DATA:  Productive cough with left lung crackles for 4 days. Fever. EXAM: CHEST  2 VIEW COMPARISON:  02/11/2016 radiographs and abdominal CT. FINDINGS: The heart size and mediastinal contours are stable. There is stable mild chronic volume loss in the left hemithorax with associated diffuse pleural thickening and chronic left basilar scarring. Reticulonodular prominence elsewhere throughout the lungs also appears unchanged. No evidence of superimposed infiltrate or pleural effusion. IMPRESSION: Stable chronic lung disease, likely sequela of tuberculosis per previous reports. No acute superimposed findings seen. Electronically Signed   By: Richardean Sale M.D.   On: 11/03/2016 13:52    Procedures Procedures (including critical care time)  Medications Ordered in UC Medications  ipratropium-albuterol (DUONEB) 0.5-2.5 (3) MG/3ML nebulizer solution 3 mL (3 mLs Nebulization Given 11/03/16 1414)     Final Clinical Impressions(s) / UC Diagnoses   Final diagnoses:  Acute bronchitis, unspecified organism   Anticipate gradual improvement in cough and discomfort over the next several days. Push fluids and rest. Chest x-ray at the urgent care today did not show any new changes, no pneumonia. Prescriptions for prednisone (steroid), for cough syrup, and for albuterol inhaler, to help with cough, were sent to the pharmacy.  Recheck for new fever >100.5, increasing phlegm production/nasal discharge, or if not starting to improve in a few days.     New Prescriptions Discharge Medication List as of 11/03/2016  2:47 PM    START taking these medications   Details  albuterol  (PROVENTIL HFA;VENTOLIN HFA) 108 (90 Base) MCG/ACT inhaler Inhale 1-2 puffs into the lungs every 4 (four) hours as needed for wheezing or shortness of breath., Starting Sun 11/03/2016, Normal    predniSONE (DELTASONE) 50 MG tablet Take 1 tablet (50 mg total) by mouth daily., Starting Sun 11/03/2016, Normal         Sherlene Shams, MD 11/04/16 [phone]

## 2016-11-10 ENCOUNTER — Encounter (HOSPITAL_COMMUNITY): Payer: Self-pay | Admitting: Emergency Medicine

## 2016-11-10 DIAGNOSIS — R05 Cough: Secondary | ICD-10-CM | POA: Insufficient documentation

## 2016-11-10 DIAGNOSIS — R1013 Epigastric pain: Secondary | ICD-10-CM | POA: Diagnosis not present

## 2016-11-10 DIAGNOSIS — Z79899 Other long term (current) drug therapy: Secondary | ICD-10-CM | POA: Diagnosis not present

## 2016-11-10 DIAGNOSIS — J9801 Acute bronchospasm: Secondary | ICD-10-CM | POA: Diagnosis not present

## 2016-11-10 DIAGNOSIS — Z87891 Personal history of nicotine dependence: Secondary | ICD-10-CM | POA: Insufficient documentation

## 2016-11-10 LAB — COMPREHENSIVE METABOLIC PANEL
ALK PHOS: 96 U/L (ref 38–126)
ALT: 30 U/L (ref 17–63)
ANION GAP: 4 — AB (ref 5–15)
AST: 38 U/L (ref 15–41)
Albumin: 2.6 g/dL — ABNORMAL LOW (ref 3.5–5.0)
BILIRUBIN TOTAL: 1.9 mg/dL — AB (ref 0.3–1.2)
BUN: 15 mg/dL (ref 6–20)
CALCIUM: 8.1 mg/dL — AB (ref 8.9–10.3)
CO2: 28 mmol/L (ref 22–32)
CREATININE: 1.06 mg/dL (ref 0.61–1.24)
Chloride: 102 mmol/L (ref 101–111)
Glucose, Bld: 239 mg/dL — ABNORMAL HIGH (ref 65–99)
Potassium: 3.8 mmol/L (ref 3.5–5.1)
Sodium: 134 mmol/L — ABNORMAL LOW (ref 135–145)
TOTAL PROTEIN: 6.8 g/dL (ref 6.5–8.1)

## 2016-11-10 LAB — CBC
HCT: 38.1 % — ABNORMAL LOW (ref 39.0–52.0)
Hemoglobin: 13 g/dL (ref 13.0–17.0)
MCH: 31 pg (ref 26.0–34.0)
MCHC: 34.1 g/dL (ref 30.0–36.0)
MCV: 90.9 fL (ref 78.0–100.0)
PLATELETS: 43 10*3/uL — AB (ref 150–400)
RBC: 4.19 MIL/uL — ABNORMAL LOW (ref 4.22–5.81)
RDW: 14.1 % (ref 11.5–15.5)
WBC: 7 10*3/uL (ref 4.0–10.5)

## 2016-11-10 LAB — URINALYSIS, ROUTINE W REFLEX MICROSCOPIC
Glucose, UA: 100 mg/dL — AB
Ketones, ur: NEGATIVE mg/dL
Leukocytes, UA: NEGATIVE
NITRITE: NEGATIVE
PROTEIN: 30 mg/dL — AB
Specific Gravity, Urine: >1.03 — ABNORMAL HIGH (ref 1.005–1.030)
pH: 6 (ref 5.0–8.0)

## 2016-11-10 LAB — URINALYSIS, MICROSCOPIC (REFLEX)

## 2016-11-10 LAB — LIPASE, BLOOD: Lipase: 47 U/L (ref 11–51)

## 2016-11-10 NOTE — ED Notes (Signed)
Labs reviewed.

## 2016-11-10 NOTE — ED Triage Notes (Signed)
Pt has had a cough for over two weeks, was seen and given an inhalier.  Cough, sore throat and drainage has continued to get worse.  Pt reports abdominal pain today.

## 2016-11-11 ENCOUNTER — Emergency Department (HOSPITAL_COMMUNITY): Payer: PRIVATE HEALTH INSURANCE

## 2016-11-11 ENCOUNTER — Emergency Department (HOSPITAL_COMMUNITY)
Admission: EM | Admit: 2016-11-11 | Discharge: 2016-11-11 | Disposition: A | Discharge status: Home / Self Care | Payer: PRIVATE HEALTH INSURANCE | Attending: Emergency Medicine | Admitting: Emergency Medicine

## 2016-11-11 DIAGNOSIS — R05 Cough: Secondary | ICD-10-CM

## 2016-11-11 DIAGNOSIS — R059 Cough, unspecified: Secondary | ICD-10-CM

## 2016-11-11 DIAGNOSIS — R1013 Epigastric pain: Secondary | ICD-10-CM

## 2016-11-11 DIAGNOSIS — J9801 Acute bronchospasm: Secondary | ICD-10-CM

## 2016-11-11 MED ORDER — IPRATROPIUM-ALBUTEROL 0.5-2.5 (3) MG/3ML IN SOLN
3.0000 mL | Freq: Once | RESPIRATORY_TRACT | Status: AC
  Administered 2016-11-11: 3 mL via RESPIRATORY_TRACT
  Filled 2016-11-11: qty 3

## 2016-11-11 MED ORDER — ALBUTEROL SULFATE HFA 108 (90 BASE) MCG/ACT IN AERS
2.0000 | INHALATION_SPRAY | Freq: Once | RESPIRATORY_TRACT | Status: AC
  Administered 2016-11-11: 2 via RESPIRATORY_TRACT
  Filled 2016-11-11: qty 6.7

## 2016-11-11 MED ORDER — DOXYCYCLINE HYCLATE 100 MG PO TABS
100.0000 mg | ORAL_TABLET | Freq: Once | ORAL | Status: AC
  Administered 2016-11-11: 100 mg via ORAL
  Filled 2016-11-11: qty 1

## 2016-11-11 MED ORDER — HYDROCODONE-ACETAMINOPHEN 5-325 MG PO TABS
1.0000 | ORAL_TABLET | Freq: Once | ORAL | Status: AC
  Administered 2016-11-11: 1 via ORAL
  Filled 2016-11-11: qty 1

## 2016-11-11 MED ORDER — DOXYCYCLINE HYCLATE 100 MG PO CAPS: 100.0000 mg | ORAL_CAPSULE | Freq: Two times a day (BID) | ORAL | 0 refills | Status: DC

## 2016-11-11 MED ORDER — DEXTROSE 5 % IV SOLN
1.0000 g | Freq: Once | INTRAVENOUS | Status: AC
  Administered 2016-11-11: 1 g via INTRAVENOUS
  Filled 2016-11-11: qty 10

## 2016-11-11 NOTE — ED Notes (Signed)
When ambulate pt.o2 was 96% HR- 88

## 2016-11-11 NOTE — ED Provider Notes (Signed)
[facility] DEPT Provider Note   CSN: [id number] Arrival date & time: 11/10/16  2207     History   Chief Complaint Chief Complaint  Patient presents with  . Cough  . Abdominal Pain    HPI Jose Snow is a 46 y.o. male.  The history is provided by the patient and a relative.  Cough  This is a new problem. The current episode started more than 1 week ago. The problem occurs every few minutes. The problem has been gradually worsening. Associated symptoms include chills, sweats, sore throat and shortness of breath. Pertinent negatives include no weight loss. He is not a smoker.  Abdominal Pain   Associated symptoms include fever.  Patient with distant h/o TB that was properly treated over 10 yrs ago presents with cough for 2 weeks He reports seen at urgent care since then, was placed on albuterol/prednisone with no improvement He continues to cough, worse at night He also reports onset of upper abdominal pain today after churhc He only has post tussive emesis He thinks the abd pain is from coughing No recent foreign travel  Daughter serves at interpreter as pt speaks local [place] language that is not available via medical interpreter   Past Medical History:  Diagnosis Date  . 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     Patient Active Problem List   Diagnosis Date Noted  . Malnutrition of moderate degree 02/14/2016  . Colitis 02/11/2016  . Elevated lactic acid level 02/11/2016  . Hepatitis B 02/17/2015  . Elevated bilirubin   . Nausea with vomiting   . Right lower quadrant abdominal pain   . Cirrhosis ([place]) 11/25/2014  . Thrombocytopenia ([place]) 11/25/2014  . Abdominal pain 11/25/2014  . History of TB (tuberculosis) 07/16/2010  . GASTROESOPHAGEAL REFLUX DISEASE, MILD 01/05/2008  . BACK PAIN, LUMBAR 11/02/2007    Past Surgical History:  Procedure Laterality Date  . COMPLEX WOUND CLOSURE Right 12/15/2015   Procedure: COMPLEX WOUND CLOSURE;   Surgeon: Leanora Cover, MD;  Location: [place];  Service: Orthopedics;  Laterality: Right;  . I&D EXTREMITY Right 12/15/2015   Procedure: IRRIGATION AND DEBRIDEMENT AND REVISION AMPUTATION RIGHT RING FINGER;  Surgeon: Leanora Cover, MD;  Location: [facility];  Service: Orthopedics;  Laterality: Right;       Home Medications    Prior to Admission medications   Medication Sig Start Date End Date Taking? Authorizing Provider  albuterol (PROVENTIL HFA;VENTOLIN HFA) 108 (90 Base) MCG/ACT inhaler Inhale 1-2 puffs into the lungs every 4 (four) hours as needed for wheezing or shortness of breath. 11/03/16   Sherlene Shams, MD  feeding supplement (BOOST / RESOURCE BREEZE) LIQD Take 1 Container by mouth daily. 02/14/16   Sheikh, Omair Latif, DO  guaiFENesin-dextromethorphan (ROBITUSSIN DM) 100-10 MG/5ML syrup Take 5 mLs by mouth every 4 (four) hours as needed for cough. 11/03/16   Sherlene Shams, MD  Multiple Vitamin (MULTIVITAMIN WITH MINERALS) TABS tablet Take 1 tablet by mouth daily. 02/15/16   Sheikh, Omair Latif, DO  predniSONE (DELTASONE) 50 MG tablet Take 1 tablet (50 mg total) by mouth daily. 11/03/16   Sherlene Shams, MD    Family History No family history on file.  Social History Social History  Substance Use Topics  . Smoking status: Former Smoker    Packs/day: 0.50    Years: 8.00    Types: Cigarettes    Quit date: 04/08/1990  . Smokeless tobacco: Never Used  . Alcohol use  No     Allergies   Patient has no known allergies.   Review of Systems Review of Systems  Constitutional: Positive for chills, diaphoresis, fatigue and fever. Negative for unexpected weight change and weight loss.  HENT: Positive for sore throat.        Coughed up small amt of blood   Respiratory: Positive for cough and shortness of breath.   Gastrointestinal: Positive for abdominal pain.  All other systems reviewed and are negative.    Physical Exam Updated Vital Signs BP (!) 109/44   Pulse 84   Temp 97.8 F  (36.6 C) (Oral)   Resp (!) 25   Ht 1.626 m (5' 4" )   SpO2 97%   Physical Exam CONSTITUTIONAL: Well developed/well nourished HEAD: Normocephalic/atraumatic EYES: EOMI/PERRL ENMT: Mucous membranes moist, uvula midline, no erythema/exudates noted, no stridor NECK: supple no meningeal signs SPINE/BACK:entire spine nontender CV: S1/S2 noted, no murmurs/rubs/gallops noted LUNGS: wheezing bilaterally, crackles in left base, no distress noted ABDOMEN: soft, nontender, no rebound or guarding, bowel sounds noted throughout abdomen GU:no cva tenderness NEURO: Pt is awake/alert/appropriate, moves all extremitiesx4.  No facial droop.   EXTREMITIES: pulses normal/equal, full ROM SKIN: warm, color normal PSYCH: no abnormalities of mood noted, alert and oriented to situation   ED Treatments / Results  Labs (all labs ordered are listed, but only abnormal results are displayed) Labs Reviewed  COMPREHENSIVE METABOLIC PANEL - Abnormal; Notable for the following:       Result Value   Sodium 134 (*)    Glucose, Bld 239 (*)    Calcium 8.1 (*)    Albumin 2.6 (*)    Total Bilirubin 1.9 (*)    Anion gap 4 (*)    All other components within normal limits  CBC - Abnormal; Notable for the following:    RBC 4.19 (*)    HCT 38.1 (*)    Platelets 43 (*)    All other components within normal limits  URINALYSIS, ROUTINE W REFLEX MICROSCOPIC - Abnormal; Notable for the following:    Color, Urine AMBER (*)    Specific Gravity, Urine >1.030 (*)    Glucose, UA 100 (*)    Hgb urine dipstick TRACE (*)    Bilirubin Urine SMALL (*)    Protein, ur 30 (*)    All other components within normal limits  URINALYSIS, MICROSCOPIC (REFLEX) - Abnormal; Notable for the following:    Bacteria, UA RARE (*)    Squamous Epithelial / LPF 0-5 (*)    All other components within normal limits  LIPASE, BLOOD    EKG  EKG Interpretation  Date/Time:  Monday November 11 2016 02:35:18 EDT Ventricular Rate:  86 PR  Interval:    QRS Duration: 94 QT Interval:  377 QTC Calculation: 451 R Axis:   54 Text Interpretation:  Sinus rhythm No significant change since last tracing Confirmed by Ripley Fraise [phone]) on 11/11/2016 3:06:36 AM       Radiology Dg Chest 2 View  Result Date: 11/11/2016 CLINICAL DATA:  Cough EXAM: CHEST  2 VIEW COMPARISON:  11/03/2016 06/24/2010 FINDINGS: Biapical pleural-parenchymal scarring consistent, concordant with history of tuberculosis. Stable reticulonodular densities along the periphery of the left lung base and right upper lobe. No new pneumonic consolidation, effusion or pneumothorax. Heart and mediastinal contours are stable allowing for slight patient rotation. Chronic left pleural thickening and blunting of the left lateral costophrenic angle. IMPRESSION: Biapical pleuroparenchymal scarring with stable reticulonodular densities, more so on the right upper and  left lower lobes. Findings may represent stigmata of reported tuberculosis. No active disease. Electronically Signed   By: Ashley Royalty M.D.   On: 11/11/2016 01:26    Procedures Procedures    Medications Ordered in ED Medications  albuterol (PROVENTIL HFA;VENTOLIN HFA) 108 (90 Base) MCG/ACT inhaler 2 puff (not administered)  HYDROcodone-acetaminophen (NORCO/VICODIN) 5-325 MG per tablet 1 tablet (1 tablet Oral Given 11/11/16 0403)  ipratropium-albuterol (DUONEB) 0.5-2.5 (3) MG/3ML nebulizer solution 3 mL (3 mLs Nebulization Given 11/11/16 0133)  cefTRIAXone (ROCEPHIN) 1 g in dextrose 5 % 50 mL IVPB (1 g Intravenous New Bag/Given 11/11/16 0402)  doxycycline (VIBRA-TABS) tablet 100 mg (100 mg Oral Given 11/11/16 0403)     Initial Impression / Assessment and Plan / ED Course  I have reviewed the triage vital signs and the nursing notes.  Pertinent labs & imaging results that were available during my care of the patient were reviewed by me and considered in my medical decision making (see chart for details).     1:44  AM I spoke to Dr Randel Pigg with radiology We reviewed CXR No signs of active TB, no acute findings  Due to persistent cough/wheezing, will treat with antibiotics and reassess 4:33 AM Pt improved He ambulated and pulse ox 96%, no tachycardia Overall he is well appearing/improved Reports ABD pain resolved His wheezing improved Due to cough for >1 week, will start oral antibiotics My suspicion for ACS/Pe is low Will treat as outpatient with albuterol and doxycycline Advised he must get a PCP Discussed with patient (he speaks some english) and family member who is English fluent We discussed strict ER return precautions  Final Clinical Impressions(s) / ED Diagnoses   Final diagnoses:  Cough  Acute bronchospasm  Epigastric abdominal pain    New Prescriptions New Prescriptions   DOXYCYCLINE (VIBRAMYCIN) 100 MG CAPSULE    Take 1 capsule (100 mg total) by mouth 2 (two) times daily. One po bid x 7 days     Ripley Fraise, MD 11/11/16 [phone]

## 2016-11-11 NOTE — ED Notes (Signed)
Pt transported to xray, pt will be moved to negative pressure room upon return

## 2016-11-11 NOTE — ED Notes (Signed)
Unable to obtain interpretor for patient at this time

## 2017-07-11 ENCOUNTER — Ambulatory Visit: Payer: PRIVATE HEALTH INSURANCE | Admitting: Physician Assistant

## 2017-07-11 ENCOUNTER — Encounter: Payer: Self-pay | Admitting: Physician Assistant

## 2017-07-11 ENCOUNTER — Ambulatory Visit (INDEPENDENT_AMBULATORY_CARE_PROVIDER_SITE_OTHER): Payer: PRIVATE HEALTH INSURANCE

## 2017-07-11 ENCOUNTER — Other Ambulatory Visit: Payer: Self-pay

## 2017-07-11 VITALS — BP 110/58 | HR 79 | Temp 97.9°F | Ht 63.0 in | Wt 134.6 lb

## 2017-07-11 DIAGNOSIS — M7989 Other specified soft tissue disorders: Secondary | ICD-10-CM

## 2017-07-11 DIAGNOSIS — L299 Pruritus, unspecified: Secondary | ICD-10-CM | POA: Diagnosis not present

## 2017-07-11 DIAGNOSIS — Z8619 Personal history of other infectious and parasitic diseases: Secondary | ICD-10-CM

## 2017-07-11 MED ORDER — LEVOCETIRIZINE DIHYDROCHLORIDE 5 MG PO TABS: 5.0000 mg | ORAL_TABLET | Freq: Every evening | ORAL | 0 refills | Status: DC

## 2017-07-11 MED ORDER — PERMETHRIN 5 % EX CREA: TOPICAL_CREAM | CUTANEOUS | 1 refills | Status: DC

## 2017-07-11 NOTE — Progress Notes (Signed)
Wt Readings from Last 3 Encounters:  07/11/17 134 lb 9.6 oz (61.1 kg)  07/22/16 125 lb (56.7 kg)  02/11/16 129 lb (58.5 kg)   Temp Readings from Last 3 Encounters:  07/11/17 97.9 F (36.6 C) (Oral)  11/11/16 98.6 F (37 C) (Oral)  11/03/16 99.3 F (37.4 C) (Oral)   BP Readings from Last 3 Encounters:  07/11/17 (!) 110/58  11/11/16 (!) 108/51  11/03/16 118/68   Pulse Readings from Last 3 Encounters:  07/11/17 79  11/11/16 74  11/03/16 94

## 2017-07-11 NOTE — Progress Notes (Signed)
07/11/2017 5:12 PM   DOB: February 08, 1971 / MRN: [id number]  SUBJECTIVE:  Jose Snow is a 47 y.o. male presenting for itchy spots on the torso.  He has a history treated TB, as well as chronic Hep B.  He works outside.   Complains of bilateral lower leg swelling and cough today.   He has No Known Allergies.   He  has a past medical history of TB (tuberculosis).    He  reports that he quit smoking about 27 years ago. His smoking use included cigarettes. He has a 4.00 pack-year smoking history. He has never used smokeless tobacco. He reports that he does not drink alcohol or use drugs. He  reports that he currently engages in sexual activity. The patient  has a past surgical history that includes I&D extremity (Right, 12/15/2015) and Complex wound closure (Right, 12/15/2015).  His family history is not on file.  Review of Systems  Constitutional: Negative for chills and fever.  Cardiovascular: Negative for chest pain.  Gastrointestinal: Negative for nausea.  Genitourinary: Negative for dysuria, flank pain, frequency, hematuria and urgency.  Skin: Negative for itching and rash.    The problem list and medications were reviewed and updated by myself where necessary and exist elsewhere in the encounter.   OBJECTIVE:  BP (!) 110/58 (BP Location: Left Arm, Patient Position: Sitting, Cuff Size: Normal)   Pulse 79   Temp 97.9 F (36.6 C) (Oral)   Ht 5' 3"  (1.6 m)   Wt 134 lb 9.6 oz (61.1 kg)   SpO2 95%   BMI 23.84 kg/m   Physical Exam  Constitutional: He appears well-developed. He is active and cooperative.  Non-toxic appearance.  Cardiovascular: Normal rate, regular rhythm, S1 normal, S2 normal, normal heart sounds, intact distal pulses and normal pulses. Exam reveals no gallop and no friction rub.  No murmur heard. Pulmonary/Chest: Effort normal. No stridor. No tachypnea. No respiratory distress. He has no wheezes. He has no rales.  Abdominal: He exhibits no distension.    Musculoskeletal: He exhibits edema (physiologic). He exhibits no tenderness.  Neurological: He is alert.  Skin: Skin is warm and dry. He is not diaphoretic. No pallor.  Vitals reviewed.   Lab Results  Component Value Date   ALT 30 11/10/2016   AST 38 11/10/2016   ALKPHOS 96 11/10/2016   BILITOT 1.9 (H) 11/10/2016     No results found for this or any previous visit (from the past 72 hour(s)).  Dg Chest 2 View  Result Date: 07/11/2017 CLINICAL DATA:  Cough.  Leg swelling. EXAM: CHEST - 2 VIEW COMPARISON:  Chest x-ray dated November 11, 2016. FINDINGS: The heart size and mediastinal contours are within normal limits. Normal pulmonary vascularity. Stable biapical pleuroparenchymal scarring and reticulonodular densities in the right upper lobe and left lung base, consistent with history of tuberculosis. Stable blunting of the left costophrenic angle related to chronic pleural thickening. No focal consolidation, pleural effusion, or pneumothorax. No acute osseous abnormality. IMPRESSION: 1. Sequelae of prior tuberculosis. No active cardiopulmonary disease. Electronically Signed   By: Titus Dubin M.D.   On: 07/11/2017 17:02    ASSESSMENT AND PLAN:  Jose Snow was seen today for ichy body.  Diagnoses and all orders for this visit:  Leg swelling -     DG Chest 2 View; Future -     CBC  Pruritic dermatitis -     Cancel: AST -     Renal Function Panel -  permethrin (ELIMITE) 5 % cream; Apply from the neck down and leave on for 12 hours.  Repeat in one week. -     levocetirizine (XYZAL) 5 MG tablet; Take 1 tablet (5 mg total) by mouth every evening.  History of hepatitis B -     Hep B Core Ab W/Reflex -     Hepatic function panel    The patient is advised to call or return to clinic if he does not see an improvement in symptoms, or to seek the care of the closest emergency department if he worsens with the above plan.   Philis Fendt, MHS, PA-C [facility] 07/11/2017 5:12 PM

## 2017-07-11 NOTE — Patient Instructions (Addendum)
Take the permethrin as prescribed.  Take the xyzal at night for itching. I'm screening your labs.     IF you received an x-ray today, you will receive an invoice from [facility]. Please contact [facility] at [phone] with questions or concerns regarding your invoice.   IF you received labwork today, you will receive an invoice from [facility]. Please contact [facility] at [phone] with questions or concerns regarding your invoice.   Our billing staff will not be able to assist you with questions regarding bills from these companies.  You will be contacted with the lab results as soon as they are available. The fastest way to get your results is to activate your My Chart account. Instructions are located on the last page of this paperwork. If you have not heard from [place] regarding the results in 2 weeks, please contact this office.

## 2017-07-15 ENCOUNTER — Telehealth: Payer: Self-pay | Admitting: Family Medicine

## 2017-07-15 ENCOUNTER — Ambulatory Visit (INDEPENDENT_AMBULATORY_CARE_PROVIDER_SITE_OTHER): Payer: PRIVATE HEALTH INSURANCE | Admitting: Family Medicine

## 2017-07-15 ENCOUNTER — Encounter: Payer: Self-pay | Admitting: General Practice

## 2017-07-15 DIAGNOSIS — D696 Thrombocytopenia, unspecified: Secondary | ICD-10-CM

## 2017-07-15 LAB — POCT CBC
Granulocyte percent: 63.8 %G (ref 37–80)
HCT, POC: 36 % — AB (ref 43.5–53.7)
HEMOGLOBIN: 11.7 g/dL — AB (ref 14.1–18.1)
LYMPH, POC: 0.7 (ref 0.6–3.4)
MCH, POC: 31.4 pg — AB (ref 27–31.2)
MCHC: 32.6 g/dL (ref 31.8–35.4)
MCV: 96.1 fL (ref 80–97)
MID (CBC): 0.2 (ref 0–0.9)
MPV: 9 fL (ref 0–99.8)
PLATELET COUNT, POC: 31 10*3/uL — AB (ref 142–424)
POC Granulocyte: 1.5 — AB (ref 2–6.9)
POC LYMPH PERCENT: 29.8 %L (ref 10–50)
POC MID %: 6.4 % (ref 0–12)
RBC: 3.74 M/uL — AB (ref 4.69–6.13)
RDW, POC: 15.3 %
WBC: 2.4 10*3/uL — AB (ref 4.6–10.2)

## 2017-07-15 LAB — CBC
HEMOGLOBIN: 11.7 g/dL — AB (ref 13.0–17.7)
Hematocrit: 33.7 % — ABNORMAL LOW (ref 37.5–51.0)
MCH: 31.9 pg (ref 26.6–33.0)
MCHC: 34.7 g/dL (ref 31.5–35.7)
MCV: 92 fL (ref 79–97)
Platelets: 24 10*3/uL — CL (ref 150–379)
RBC: 3.67 x10E6/uL — ABNORMAL LOW (ref 4.14–5.80)
RDW: 14.8 % (ref 12.3–15.4)
WBC: 2.8 10*3/uL — AB (ref 3.4–10.8)

## 2017-07-15 LAB — HEPATIC FUNCTION PANEL
ALT: 34 IU/L (ref 0–44)
AST: 55 IU/L — AB (ref 0–40)
Alkaline Phosphatase: 110 IU/L (ref 39–117)
BILIRUBIN TOTAL: 2.8 mg/dL — AB (ref 0.0–1.2)
Bilirubin, Direct: 0.85 mg/dL — ABNORMAL HIGH (ref 0.00–0.40)
TOTAL PROTEIN: 7.1 g/dL (ref 6.0–8.5)

## 2017-07-15 LAB — HEPATITIS B CORE AB W/REFLEX: Hep B Core Total Ab: POSITIVE — AB

## 2017-07-15 LAB — RENAL FUNCTION PANEL
ALBUMIN: 3 g/dL — AB (ref 3.5–5.5)
BUN / CREAT RATIO: 15 (ref 9–20)
BUN: 13 mg/dL (ref 6–24)
CHLORIDE: 105 mmol/L (ref 96–106)
CO2: 23 mmol/L (ref 20–29)
CREATININE: 0.86 mg/dL (ref 0.76–1.27)
Calcium: 8.2 mg/dL — ABNORMAL LOW (ref 8.7–10.2)
GFR calc Af Amer: 118 mL/min/{1.73_m2} (ref >59)
GFR calc non Af Amer: 102 mL/min/{1.73_m2} (ref >59)
Glucose: 85 mg/dL (ref 65–99)
Phosphorus: 2.9 mg/dL (ref 2.5–4.5)
Potassium: 4 mmol/L (ref 3.5–5.2)
Sodium: 140 mmol/L (ref 134–144)

## 2017-07-15 LAB — HBCIGM: HEP B C IGM: NEGATIVE

## 2017-07-15 NOTE — Progress Notes (Signed)
Here for lab only visit, WBC 2.4, hemoglobin 11.7, platelets 31.  Appears to have pancytopenia.  Record reviewed, he did have low WBC, hemoglobin and platelets of 31 in 2017.  Was referred to oncology in 2016, but no show for those appointments based on review of [facility].    Has follow-up planned in 3 days with Jose Snow.  We will have him discuss further at that time, likely repeat hematology referral.  Jose Snow patient, has professional interpreter present.  Denies any recent bruising or bleeding.  History of nosebleeds back in January.  Follow-up in 3 days as planned, ER precautions discussed.

## 2017-07-15 NOTE — Telephone Encounter (Signed)
Pt called questioning lab appt for F/U CBC. Reiterated need for re-draw today, not Friday. Verbalizes understanding.

## 2017-07-15 NOTE — Telephone Encounter (Signed)
Patient seen by Philis Fendt yesterday.  Results given to me by lab tech this morning regarding alert level of platelets at 24.  Will have patient return for repeat CBC today.   Please call pt - return for CBC today, lab only visit unless he is having any bruising or bleeding, then needs to either present to the emergency room, or check in to be seen right away.

## 2017-07-15 NOTE — Telephone Encounter (Signed)
This encounter was created in error - please disregard.

## 2017-07-15 NOTE — Telephone Encounter (Signed)
Left a detailed message  Per message below.

## 2017-07-15 NOTE — Progress Notes (Signed)
Lab Only visit

## 2017-07-15 NOTE — Progress Notes (Signed)
Work note written per provider. [place], [place]

## 2017-07-18 ENCOUNTER — Encounter: Payer: Self-pay | Admitting: Physician Assistant

## 2017-07-18 ENCOUNTER — Other Ambulatory Visit: Payer: Self-pay

## 2017-07-18 ENCOUNTER — Ambulatory Visit: Payer: PRIVATE HEALTH INSURANCE | Admitting: Physician Assistant

## 2017-07-18 VITALS — BP 102/68 | HR 77 | Temp 98.0°F | Resp 16 | Ht 62.4 in | Wt 132.0 lb

## 2017-07-18 DIAGNOSIS — D61818 Other pancytopenia: Secondary | ICD-10-CM | POA: Diagnosis not present

## 2017-07-18 DIAGNOSIS — B180 Chronic viral hepatitis B with delta-agent: Secondary | ICD-10-CM

## 2017-07-18 DIAGNOSIS — E8809 Other disorders of plasma-protein metabolism, not elsewhere classified: Secondary | ICD-10-CM

## 2017-07-18 NOTE — Progress Notes (Signed)
07/18/2017 5:04 PM   DOB: 09-04-1970 / MRN: [id number]  SUBJECTIVE:  Jose Snow is a 47 y.o. male with a history of chronic Hep B, treated tuberculosis, and chronic thrombocytopenia presenting for lab follow up.  I had seen him for itching 5 days ago.  He tells me that that has improved with permethrin. He was unaware that he had been referred to hematology in the past.    He has No Known Allergies.   He  has a past medical history of TB (tuberculosis).    He  reports that he quit smoking about 27 years ago. His smoking use included cigarettes. He has a 4.00 pack-year smoking history. He has never used smokeless tobacco. He reports that he does not drink alcohol or use drugs. He  reports that he currently engages in sexual activity. The patient  has a past surgical history that includes I&D extremity (Right, 12/15/2015) and Complex wound closure (Right, 12/15/2015).  His family history is not on file.  Review of Systems  Constitutional: Negative for chills, diaphoresis and fever.  Eyes: Negative.   Respiratory: Negative for cough, hemoptysis, sputum production, shortness of breath and wheezing.   Cardiovascular: Negative for chest pain, orthopnea and leg swelling.  Gastrointestinal: Negative for nausea.  Skin: Negative for itching and rash.  Neurological: Negative for dizziness, sensory change, speech change, focal weakness and headaches.    The problem list and medications were reviewed and updated by myself where necessary and exist elsewhere in the encounter.   OBJECTIVE:  BP 102/68   Pulse 77   Temp 98 F (36.7 C) (Oral)   Resp 16   Ht 5' 2.4" (1.585 m)   Wt 132 lb (59.9 kg)   SpO2 98%   BMI 23.83 kg/m   Wt Readings from Last 3 Encounters:  07/18/17 132 lb (59.9 kg)  07/11/17 134 lb 9.6 oz (61.1 kg)  07/22/16 125 lb (56.7 kg)   Temp Readings from Last 3 Encounters:  07/18/17 98 F (36.7 C) (Oral)  07/11/17 97.9 F (36.6 C) (Oral)  11/11/16 98.6 F (37 C) (Oral)    BP Readings from Last 3 Encounters:  07/18/17 102/68  07/11/17 (!) 110/58  11/11/16 (!) 108/51   Pulse Readings from Last 3 Encounters:  07/18/17 77  07/11/17 79  11/11/16 74     Physical Exam  Constitutional: He is oriented to person, place, and time. He appears well-developed. He does not appear ill.  Eyes: Pupils are equal, round, and reactive to light. Conjunctivae and EOM are normal. Scleral icterus (mild) is present.  Cardiovascular: Normal rate.  Pulmonary/Chest: Effort normal.  Abdominal: He exhibits no distension.  Musculoskeletal: Normal range of motion.  Neurological: He is alert and oriented to person, place, and time. No cranial nerve deficit. Coordination normal.  Skin: Skin is warm and dry. No rash noted. He is not diaphoretic.  Psychiatric: He has a normal mood and affect.  Nursing note and vitals reviewed.   CBC Latest Ref Rng & Units 07/15/2017 07/11/2017 11/10/2016  WBC 4.6 - 10.2 K/uL 2.4(A) 2.8(L) 7.0  Hemoglobin 14.1 - 18.1 g/dL 11.7(A) 11.7(L) 13.0  Hematocrit 43.5 - 53.7 % 36.0(A) 33.7(L) 38.1(L)  Platelets 150 - 379 x10E3/uL 31 24(LL) 43(L)     No results found for this or any previous visit (from the past 72 hour(s)).  No results found.  ASSESSMENT AND PLAN:  Y was seen today for leg swelling and lab review.  Diagnoses and all orders  for this visit:  Chronic viral hepatitis B without coma and with delta agent ([place]) -     Ambulatory referral to Infectious Disease  Serum albumin decreased -     Hemoglobin A1c -     Microalbumin, urine  Pancytopenia ([place]) -     Ambulatory referral to Hematology    The patient is advised to call or return to clinic if he does not see an improvement in symptoms, or to seek the care of the closest emergency department if he worsens with the above plan.   Philis Fendt, MHS, PA-C [facility] 07/18/2017 5:04 PM

## 2017-07-18 NOTE — Patient Instructions (Addendum)
  Eat lots of protein and supplement with boost.    IF you received an x-ray today, you will receive an invoice from [facility]. Please contact [facility] at [phone] with questions or concerns regarding your invoice.   IF you received labwork today, you will receive an invoice from [facility]. Please contact [facility] at [phone] with questions or concerns regarding your invoice.   Our billing staff will not be able to assist you with questions regarding bills from these companies.  You will be contacted with the lab results as soon as they are available. The fastest way to get your results is to activate your My Chart account. Instructions are located on the last page of this paperwork. If you have not heard from [place] regarding the results in 2 weeks, please contact this office.

## 2017-07-19 LAB — MICROALBUMIN, URINE: MICROALBUM., U, RANDOM: 6.4 ug/mL

## 2017-07-19 LAB — HEMOGLOBIN A1C
ESTIMATED AVERAGE GLUCOSE: 85 mg/dL
Hgb A1c MFr Bld: 4.6 % — ABNORMAL LOW (ref 4.8–5.6)

## 2017-07-22 ENCOUNTER — Other Ambulatory Visit: Payer: Self-pay

## 2017-07-22 ENCOUNTER — Emergency Department (HOSPITAL_COMMUNITY)
Admission: EM | Admit: 2017-07-22 | Discharge: 2017-07-23 | Disposition: A | Discharge status: Home / Self Care | Payer: PRIVATE HEALTH INSURANCE | Attending: Emergency Medicine | Admitting: Emergency Medicine

## 2017-07-22 ENCOUNTER — Encounter (HOSPITAL_COMMUNITY): Payer: Self-pay | Admitting: Emergency Medicine

## 2017-07-22 DIAGNOSIS — Z87891 Personal history of nicotine dependence: Secondary | ICD-10-CM | POA: Insufficient documentation

## 2017-07-22 DIAGNOSIS — K2900 Acute gastritis without bleeding: Secondary | ICD-10-CM | POA: Insufficient documentation

## 2017-07-22 DIAGNOSIS — R040 Epistaxis: Secondary | ICD-10-CM

## 2017-07-22 DIAGNOSIS — K2960 Other gastritis without bleeding: Secondary | ICD-10-CM

## 2017-07-22 DIAGNOSIS — D696 Thrombocytopenia, unspecified: Secondary | ICD-10-CM | POA: Diagnosis not present

## 2017-07-22 NOTE — ED Triage Notes (Signed)
Pt reporting that he had a nose bleed starting around 5pm. No bleeding present at this time. Pt also reports a burning sensation in his epigastric region that started approx 10pm.

## 2017-07-23 ENCOUNTER — Emergency Department (HOSPITAL_COMMUNITY): Payer: PRIVATE HEALTH INSURANCE

## 2017-07-23 LAB — COMPREHENSIVE METABOLIC PANEL
ALT: 37 U/L (ref 17–63)
AST: 60 U/L — ABNORMAL HIGH (ref 15–41)
Albumin: 2.4 g/dL — ABNORMAL LOW (ref 3.5–5.0)
Alkaline Phosphatase: 95 U/L (ref 38–126)
Anion gap: 6 (ref 5–15)
BUN: 19 mg/dL (ref 6–20)
CHLORIDE: 106 mmol/L (ref 101–111)
CO2: 25 mmol/L (ref 22–32)
CREATININE: 1.08 mg/dL (ref 0.61–1.24)
Calcium: 8.1 mg/dL — ABNORMAL LOW (ref 8.9–10.3)
GFR calc Af Amer: >60 mL/min (ref >60)
GLUCOSE: 131 mg/dL — AB (ref 65–99)
Potassium: 3.8 mmol/L (ref 3.5–5.1)
SODIUM: 137 mmol/L (ref 135–145)
Total Bilirubin: 2.2 mg/dL — ABNORMAL HIGH (ref 0.3–1.2)
Total Protein: 6.1 g/dL — ABNORMAL LOW (ref 6.5–8.1)

## 2017-07-23 LAB — CBC
HCT: 31.7 % — ABNORMAL LOW (ref 39.0–52.0)
Hemoglobin: 10.8 g/dL — ABNORMAL LOW (ref 13.0–17.0)
MCH: 31.7 pg (ref 26.0–34.0)
MCHC: 34.1 g/dL (ref 30.0–36.0)
MCV: 93 fL (ref 78.0–100.0)
PLATELETS: 18 10*3/uL — AB (ref 150–400)
RBC: 3.41 MIL/uL — ABNORMAL LOW (ref 4.22–5.81)
RDW: 15.2 % (ref 11.5–15.5)
WBC: 2 10*3/uL — ABNORMAL LOW (ref 4.0–10.5)

## 2017-07-23 LAB — URINALYSIS, ROUTINE W REFLEX MICROSCOPIC
BACTERIA UA: NONE SEEN
Bilirubin Urine: NEGATIVE
Glucose, UA: NEGATIVE mg/dL
Ketones, ur: NEGATIVE mg/dL
Leukocytes, UA: NEGATIVE
Nitrite: NEGATIVE
PH: 6 (ref 5.0–8.0)
Protein, ur: NEGATIVE mg/dL
SPECIFIC GRAVITY, URINE: 1.021 (ref 1.005–1.030)
SQUAMOUS EPITHELIAL / LPF: NONE SEEN

## 2017-07-23 LAB — I-STAT TROPONIN, ED: TROPONIN I, POC: 0.01 ng/mL (ref 0.00–0.08)

## 2017-07-23 LAB — LIPASE, BLOOD: LIPASE: 55 U/L — AB (ref 11–51)

## 2017-07-23 MED ORDER — OXYMETAZOLINE HCL 0.05 % NA SOLN
1.0000 | Freq: Once | NASAL | Status: AC
Start: 2017-07-23 — End: 2017-07-23
  Administered 2017-07-23: 1 via NASAL
  Filled 2017-07-23: qty 15

## 2017-07-23 MED ORDER — SILVER NITRATE-POT NITRATE 75-25 % EX MISC
1.0000 | Freq: Once | CUTANEOUS | Status: AC
  Administered 2017-07-23: 1 via TOPICAL
  Filled 2017-07-23: qty 1

## 2017-07-23 MED ORDER — GI COCKTAIL ~~LOC~~
30.0000 mL | Freq: Once | ORAL | Status: AC
Start: 2017-07-23 — End: 2017-07-23
  Administered 2017-07-23: 30 mL via ORAL
  Filled 2017-07-23: qty 30

## 2017-07-23 MED ORDER — OMEPRAZOLE 20 MG PO CPDR: 20.0000 mg | DELAYED_RELEASE_CAPSULE | Freq: Every day | ORAL | 0 refills | Status: DC

## 2017-07-23 NOTE — ED Provider Notes (Signed)
[place] EMERGENCY DEPARTMENT Provider Note   CSN: [id number] Arrival date & time: 07/22/17  2247     History   Chief Complaint Chief Complaint  Patient presents with  . Epistaxis  . Abdominal Pain    HPI Jose Snow is a 47 y.o. male.  HPI  This is a 47 year old male with a history of cirrhosis, thrombocytopenia who presents with epistaxis.  Patient reports onset of bleeding from the left naris around 5 PM.  He was at work.  He also reported coughing and noting blood in his sputum.  He denies any shortness of breath.  He reports burning sensation in his upper abdomen and lower chest since 10 PM.  Nothing seems to make it better or worse.  Currently it is an 8 out of 10.  He has not had any recurrent nosebleed.  He has a history of thrombocytopenia.  He is not taking anything for his symptoms.  Patient speaks some [place].  Family is at the bedside translating.  Past Medical History:  Diagnosis Date  . 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     Patient Active Problem List   Diagnosis Date Noted  . Hepatitis B 02/17/2015  . Elevated bilirubin   . Cirrhosis ([place]) 11/25/2014  . Thrombocytopenia ([place]) 11/25/2014  . History of TB (tuberculosis) 07/16/2010    Past Surgical History:  Procedure Laterality Date  . COMPLEX WOUND CLOSURE Right 12/15/2015   Procedure: COMPLEX WOUND CLOSURE;  Surgeon: Leanora Cover, MD;  Location: [facility];  Service: Orthopedics;  Laterality: Right;  . I&D EXTREMITY Right 12/15/2015   Procedure: IRRIGATION AND DEBRIDEMENT AND REVISION AMPUTATION RIGHT RING FINGER;  Surgeon: Leanora Cover, MD;  Location: [facility];  Service: Orthopedics;  Laterality: Right;        Home Medications    Prior to Admission medications   Medication Sig Start Date End Date Taking? Authorizing Provider  levocetirizine (XYZAL) 5 MG tablet Take 1 tablet (5 mg total) by mouth every evening. 07/11/17   Tereasa Coop, PA-C  omeprazole  (PRILOSEC) 20 MG capsule Take 1 capsule (20 mg total) by mouth daily. 07/23/17   Mathhew Buysse, Barbette Hair, MD    Family History No family history on file.  Social History Social History   Tobacco Use  . Smoking status: Former Smoker    Packs/day: 0.50    Years: 8.00    Pack years: 4.00    Types: Cigarettes    Last attempt to quit: 04/08/1990    Years since quitting: 27.3  . Smokeless tobacco: Never Used  Substance Use Topics  . Alcohol use: No  . Drug use: No     Allergies   Patient has no known allergies.   Review of Systems Review of Systems  Constitutional: Negative for fever.  HENT: Positive for nosebleeds.   Respiratory: Negative for shortness of breath.   Cardiovascular: Negative for chest pain.  Gastrointestinal: Positive for abdominal pain. Negative for vomiting.  Genitourinary: Negative for dysuria.  All other systems reviewed and are negative.    Physical Exam Updated Vital Signs BP (!) 117/57   Pulse 65   Temp 97.8 F (36.6 C) (Oral)   Resp 18   SpO2 98%   Physical Exam  Constitutional: He is oriented to person, place, and time. He appears well-developed and well-nourished.  HENT:  Head: Normocephalic and atraumatic.  Mouth/Throat: Oropharynx is clear and moist.  Nasal turbinates swollen bilaterally, friability noted over  the left nasal septum, no active bleeding or clot noted  Eyes: Pupils are equal, round, and reactive to light.  Neck: Neck supple.  Cardiovascular: Normal rate, regular rhythm and normal heart sounds.  No murmur heard. Pulmonary/Chest: Effort normal and breath sounds normal. No respiratory distress. He has no wheezes.  Abdominal: Soft. Bowel sounds are normal. There is no tenderness. There is no rebound.  Tenderness to palpation of the epigastrium without rebound or guarding  Musculoskeletal: He exhibits no edema.  Lymphadenopathy:    He has no cervical adenopathy.  Neurological: He is alert and oriented to person, place, and time.    Skin: Skin is warm and dry.  Psychiatric: He has a normal mood and affect.  Nursing note and vitals reviewed.    ED Treatments / Results  Labs (all labs ordered are listed, but only abnormal results are displayed) Labs Reviewed  LIPASE, BLOOD - Abnormal; Notable for the following components:      Result Value   Lipase 55 (*)    All other components within normal limits  COMPREHENSIVE METABOLIC PANEL - Abnormal; Notable for the following components:   Glucose, Bld 131 (*)    Calcium 8.1 (*)    Total Protein 6.1 (*)    Albumin 2.4 (*)    AST 60 (*)    Total Bilirubin 2.2 (*)    All other components within normal limits  CBC - Abnormal; Notable for the following components:   WBC 2.0 (*)    RBC 3.41 (*)    Hemoglobin 10.8 (*)    HCT 31.7 (*)    Platelets 18 (*)    All other components within normal limits  URINALYSIS, ROUTINE W REFLEX MICROSCOPIC - Abnormal; Notable for the following components:   Hgb urine dipstick SMALL (*)    All other components within normal limits  I-STAT TROPONIN, ED    EKG EKG Interpretation  Date/Time:  Wednesday July 23 2017 04:20:03 EDT Ventricular Rate:  64 PR Interval:    QRS Duration: 104 QT Interval:  438 QTC Calculation: 452 R Axis:   84 Text Interpretation:  Sinus rhythm ST elev, probable normal early repol pattern No significant change was found Confirmed by Thayer Jew [phone]) on 07/23/2017 4:38:59 AM   Radiology Dg Abdomen Acute W/chest  Result Date: 07/23/2017 CLINICAL DATA:  Lower chest pain EXAM: DG ABDOMEN ACUTE W/ 1V CHEST COMPARISON:  07/11/2017, 11/11/2016 FINDINGS: Single-view chest demonstrates atelectasis or scar at the left base. Tiny left pleural effusion or thickening. Stable cardiomediastinal silhouette with mild vascular congestion. Stable pleural and parenchymal scarring at the apices. Supine and upright views of the abdomen demonstrate no free air beneath the diaphragm. Nonobstructed bowel-gas pattern with  moderate stool in the right upper quadrant. No abnormal calcifications. IMPRESSION: 1. Stable radiographic appearance of the chest with apical pleural and parenchymal scarring and tiny left effusion or thickening. Subsegmental atelectasis or scar at the left base 2. Nonobstructed bowel-gas pattern Electronically Signed   By: Donavan Foil M.D.   On: 07/23/2017 03:58    Procedures .Epistaxis Management Date/Time: 07/23/2017 4:39 AM Performed by: Merryl Hacker, MD Authorized by: Merryl Hacker, MD   Consent:    Consent obtained:  Verbal   Consent given by:  Patient   Risks discussed:  Bleeding and nasal injury   Alternatives discussed:  No treatment Anesthesia (see MAR for exact dosages):    Anesthesia method:  None Procedure details:    Treatment site:  L anterior  Treatment method:  Silver nitrate   Treatment complexity:  Limited   Treatment episode: initial   Post-procedure details:    Assessment:  No improvement   Patient tolerance of procedure:  Tolerated well, no immediate complications   (including critical care time)  Medications Ordered in ED Medications  oxymetazoline (AFRIN) 0.05 % nasal spray 1 spray (1 spray Each Nare Given 07/23/17 0408)  silver nitrate applicators applicator 1 Stick (1 Stick Topical Given 07/23/17 0413)  gi cocktail (Maalox,Lidocaine,Donnatal) (30 mLs Oral Given 07/23/17 0359)     Initial Impression / Assessment and Plan / ED Course  I have reviewed the triage vital signs and the nursing notes.  Pertinent labs & imaging results that were available during my care of the patient were reviewed by me and considered in my medical decision making (see chart for details).     Presents with both epistaxis and upper abdominal/lower chest pain.  He is overall nontoxic appearing.  Vital signs are reassuring.  No evidence of active nosebleed.  He does have a history of thrombocytopenia.  Platelets are 18.  He also has evidence of friability over the  left nasal septum.  This was cauterized to prevent further bleeding.  Regarding his upper abdominal pain considerations include pancreatitis, gastritis, peptic ulcer, less likely ACS.  Patient was given a GI cocktail with complete resolution of symptoms.  Lab work is otherwise at the patient's baseline.  EKG shows no evidence of ischemia and acute abdominal series shows no pneumothorax, pneumonia, air-fluid levels.  Patient is tolerating fluids without difficulty.  Will discharge with omeprazole.  Recommend close follow-up with PCP.  This was relayed to the patient and his family.  After history, exam, and medical workup I feel the patient has been appropriately medically screened and is safe for discharge home. Pertinent diagnoses were discussed with the patient. Patient was given return precautions.   Final Clinical Impressions(s) / ED Diagnoses   Final diagnoses:  Epistaxis  Other gastritis without hemorrhage, unspecified chronicity  Thrombocytopenia [facility])    ED Discharge Orders        Ordered    omeprazole (PRILOSEC) 20 MG capsule  Daily     07/23/17 0437       Merryl Hacker, MD 07/23/17 [phone]

## 2017-07-30 NOTE — Progress Notes (Signed)
[facility] NOTE  Patient Care Team: Hillis Range as PCP - General (Urgent Care) Rosana Hoes, MD (Inactive) as Referring Physician (Family Medicine)  Date of Service:  08/05/2017   CHIEF COMPLAINTS/PURPOSE OF CONSULTATION:  Pancytopenia  HISTORY OF PRESENTING ILLNESS: 08/05/17   Jose Snow 47 y.o. male from [place] is here because of Pancytopenia. He was referred by his PCP, Dr. Nash Mantis. Dr. Mauricio Po is his ID doctor (FOR [place] OF Hep B). He presents to the clinic today accompanied by his [place] as he speaks little english.   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 Pt is not sure if he had for longer. He notes he found out he had Hep B in 2016 upon hospitalization in 11/2014. He denies any other health issues prior to this. He has not seen a hepatologist.  Pt denies trouble with infections or needing antibiotics. He has used aspirin as needed. He denies having recent abdominal US. He denies having a plan with Dr. Elna Breslow for Hep B treatment. In the past he was treated for TB. He has not needed a blood transfusion in the past. He is not sure how he acquired Hep B. He is married.  Pt understand he has a liver issue and low blood counts as discussed with Dr. Elna Breslow.   On review of symptoms, pt notes b/l leg swelling and his skin has been itching. After shower he will feel hot in his body along with itching. He notes he had a cut on his finger once and took some time to stop bleeding. He notes occasional epistaxis and black stool. This has not been evaluated by GI. Pt notes he used to be about 150 pounds and now in the 130s. He is not sure how long it took him to loose this weight.   He was found to have abnormal CBC from 2016 in [facility] records.  He has occasional epistaxis and black stools.  The patient has history of liver disease and denies exposure to heparin, history of cardiac murmur/prior  cardiovascular surgery or recent new medications He denies prior blood or platelet transfusions   MEDICAL HISTORY:  Past Medical History:  Diagnosis Date  . 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     SURGICAL HISTORY: Past Surgical History:  Procedure Laterality Date  . COMPLEX WOUND CLOSURE Right 12/15/2015   Procedure: COMPLEX WOUND CLOSURE;  Surgeon: Leanora Cover, MD;  Location: [facility];  Service: Orthopedics;  Laterality: Right;  . I&D EXTREMITY Right 12/15/2015   Procedure: IRRIGATION AND DEBRIDEMENT AND REVISION AMPUTATION RIGHT RING FINGER;  Surgeon: Leanora Cover, MD;  Location: [facility];  Service: Orthopedics;  Laterality: Right;    SOCIAL HISTORY: Social History   Socioeconomic History  . Marital status: Married    Spouse name: Not on file  . Number of children: Not on file  . Years of education: Not on file  . Highest education level: Not on file  Occupational History  . 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 Financial resource strain: Not on file  . Food insecurity:    Worry: Not on file    Inability: Not on file  . Transportation needs:    Medical: Not on file    Non-medical: Not on file  Tobacco Use  . Smoking status: Former Smoker    Packs/day: 0.50  Years: 8.00    Pack years: 4.00    Types: Cigarettes    Last attempt to quit: 04/08/1990    Years since quitting: 27.3  . Smokeless tobacco: Never Used  Substance and Sexual Activity  . Alcohol use: No  . Drug use: No  . Sexual activity: Yes  Lifestyle  . Physical activity:    Days per week: Not on file    Minutes per session: Not on file  . Stress: Not on file  Relationships  . Social connections:    Talks on phone: Not on file    Gets together: Not on file    Attends religious service: Not on file    Active member of club or organization: Not on file    Attends meetings of clubs or organizations: Not on file    Relationship status: Not on file  . Intimate  partner violence:    Fear of current or ex partner: Not on file    Emotionally abused: Not on file    Physically abused: Not on file    Forced sexual activity: Not on file  Other Topics Concern  . Not on file  Social History Narrative  . Not on file    FAMILY HISTORY: History reviewed. No pertinent family history.  ALLERGIES:  has No Known Allergies.  MEDICATIONS:  Current Outpatient Medications  Medication Sig Dispense Refill  . levocetirizine (XYZAL) 5 MG tablet Take 1 tablet (5 mg total) by mouth every evening. 30 tablet 0  . omeprazole (PRILOSEC) 20 MG capsule Take 1 capsule (20 mg total) by mouth daily. 30 capsule 0   No current facility-administered medications for this visit.     REVIEW OF SYSTEMS:   Constitutional: Denies fevers, chills or abnormal night sweats (+) weight loss  Eyes: Denies blurriness of vision, double vision or watery eyes Ears, nose, mouth, throat, and face: Denies mucositis or sore throat (+) occasional epistaxes  Respiratory: Denies cough, dyspnea or wheezes Cardiovascular: Denies palpitation, chest discomfort (+) b/l LE swelling  Gastrointestinal:  Denies nausea, heartburn or change in bowel habits (+) occasional black stool   Skin: Denies abnormal skin rashes (+) diffuse skin itching. (+) healed bruising of legs  Lymphatics: Denies new lymphadenopathy or easy bruising Neurological:Denies numbness, tingling or new weaknesses Behavioral/Psych: Mood is stable, no new changes  All other systems were reviewed with the patient and are negative.  PHYSICAL EXAMINATION: ECOG PERFORMANCE STATUS: 1 - Symptomatic but completely ambulatory  Vitals:   08/05/17 1056  BP: (!) 106/39  Pulse: 70  Resp: 18  Temp: 97.9 F (36.6 C)  SpO2: 100%   Filed Weights   08/05/17 1056  Weight: 130 lb 11.2 oz (59.3 kg)    GENERAL:alert, no distress and comfortable SKIN: skin color, texture, turgor are normal, no rashes or significant lesions (+) healed bruising  of b/l legs EYES: normal, conjunctiva are pink and non-injected, sclera clear OROPHARYNX:no exudate, no erythema and lips, buccal mucosa, and tongue normal  NECK: supple, thyroid normal size, non-tender, without nodularity LYMPH:  no palpable lymphadenopathy in the cervical, axillary or inguinal LUNGS: clear to auscultation and percussion with normal breathing effort HEART: regular rate & rhythm and no murmurs (+) b/l pedal edema ABDOMEN:abdomen soft, non-tender and normal bowel sounds (+) spleen palpable 4-5 finger widths below costal margin Musculoskeletal:no cyanosis of digits and no clubbing  PSYCH: alert & oriented x 3 with fluent speech NEURO: no focal motor/sensory deficits  LABORATORY DATA:  I have reviewed the data  as listed Recent Results (from the past 2160 hour(s))  Hep B Core Ab W/Reflex     Status: Abnormal   Collection Time: 07/11/17  5:19 PM  Result Value Ref Range   Hep B Core Total Ab Positive (A) Negative  Hepatic function panel     Status: Abnormal   Collection Time: 07/11/17  5:19 PM  Result Value Ref Range   Total Protein 7.1 6.0 - 8.5 g/dL   Bilirubin Total 2.8 (H) 0.0 - 1.2 mg/dL   Bilirubin, Direct 0.85 (H) 0.00 - 0.40 mg/dL   Alkaline Phosphatase 110 39 - 117 IU/L   AST 55 (H) 0 - 40 IU/L   ALT 34 0 - 44 IU/L  Renal function panel     Status: Abnormal   Collection Time: 07/11/17  5:19 PM  Result Value Ref Range   Glucose 85 65 - 99 mg/dL   BUN 13 6 - 24 mg/dL   Creatinine, Ser 0.86 0.76 - 1.27 mg/dL   GFR calc non Af Amer 102 >59 mL/min/1.73   GFR calc Af Amer 118 >59 mL/min/1.73   BUN/Creatinine Ratio 15 9 - 20   Sodium 140 134 - 144 mmol/L   Potassium 4.0 3.5 - 5.2 mmol/L   Chloride 105 96 - 106 mmol/L   CO2 23 20 - 29 mmol/L   Calcium 8.2 (L) 8.7 - 10.2 mg/dL   Phosphorus 2.9 2.5 - 4.5 mg/dL   Albumin 3.0 (L) 3.5 - 5.5 g/dL  CBC     Status: Abnormal   Collection Time: 07/11/17  5:19 PM  Result Value Ref Range   WBC 2.8 (L) 3.4 - 10.8  x10E3/uL   RBC 3.67 (L) 4.14 - 5.80 x10E6/uL   Hemoglobin 11.7 (L) 13.0 - 17.7 g/dL   Hematocrit 33.7 (L) 37.5 - 51.0 %   MCV 92 79 - 97 fL   MCH 31.9 26.6 - 33.0 pg   MCHC 34.7 31.5 - 35.7 g/dL   RDW 14.8 12.3 - 15.4 %   Platelets 24 (LL) 150 - 379 x10E3/uL    Comment: Platelet count verified by examination of peripheral blood smear.   Hematology Comments: Note:     Comment: Verified by microscopic examination.  HBcIgM     Status: None   Collection Time: 07/11/17  5:19 PM  Result Value Ref Range   Hep B C IgM Negative Negative  POCT CBC     Status: Abnormal   Collection Time: 07/15/17 10:54 AM  Result Value Ref Range   WBC 2.4 (A) 4.6 - 10.2 K/uL   Lymph, poc 0.7 0.6 - 3.4   POC LYMPH PERCENT 29.8 10 - 50 %L   MID (cbc) 0.2 0 - 0.9   POC MID % 6.4 0 - 12 %M   POC Granulocyte 1.5 (A) 2 - 6.9   Granulocyte percent 63.8 37 - 80 %G   RBC 3.74 (A) 4.69 - 6.13 M/uL   Hemoglobin 11.7 (A) 14.1 - 18.1 g/dL   HCT, POC 36.0 (A) 43.5 - 53.7 %   MCV 96.1 80 - 97 fL   MCH, POC 31.4 (A) 27 - 31.2 pg   MCHC 32.6 31.8 - 35.4 g/dL   RDW, POC 15.3 %   Platelet Count, POC 31 (A) 142 - 424 K/uL   MPV 9.0 0 - 99.8 fL  Hemoglobin A1c     Status: Abnormal   Collection Time: 07/18/17  5:07 PM  Result Value Ref Range   Hgb A1c MFr  Bld 4.6 (L) 4.8 - 5.6 %    Comment:          Prediabetes: 5.7 - 6.4          Diabetes: >6.4          Glycemic control for adults with diabetes: <7.0    Est. average glucose Bld gHb Est-mCnc 85 mg/dL  Microalbumin, urine     Status: None   Collection Time: 07/18/17  5:16 PM  Result Value Ref Range   Microalbumin, Urine 6.4 Not Estab. ug/mL  Lipase, blood     Status: Abnormal   Collection Time: 07/22/17 11:34 PM  Result Value Ref Range   Lipase 55 (H) 11 - 51 U/L    Comment: Performed at [facility], [place] [address], [place], [place] [address]  Comprehensive metabolic panel     Status: Abnormal   Collection Time: 07/22/17 11:34 PM  Result Value Ref Range    Sodium 137 135 - 145 mmol/L   Potassium 3.8 3.5 - 5.1 mmol/L   Chloride 106 101 - 111 mmol/L   CO2 25 22 - 32 mmol/L   Glucose, Bld 131 (H) 65 - 99 mg/dL   BUN 19 6 - 20 mg/dL   Creatinine, Ser 1.08 0.61 - 1.24 mg/dL   Calcium 8.1 (L) 8.9 - 10.3 mg/dL   Total Protein 6.1 (L) 6.5 - 8.1 g/dL   Albumin 2.4 (L) 3.5 - 5.0 g/dL   AST 60 (H) 15 - 41 U/L   ALT 37 17 - 63 U/L   Alkaline Phosphatase 95 38 - 126 U/L   Total Bilirubin 2.2 (H) 0.3 - 1.2 mg/dL   GFR calc non Af Amer >60 >60 mL/min   GFR calc Af Amer >60 >60 mL/min    Comment: (NOTE) The eGFR has been calculated using the CKD EPI equation. This calculation has not been validated in all clinical situations. eGFR's persistently <60 mL/min signify possible Chronic Kidney Disease.    Anion gap 6 5 - 15    Comment: Performed at [facility] [address] [place], [place] [address]  CBC     Status: Abnormal   Collection Time: 07/22/17 11:34 PM  Result Value Ref Range   WBC 2.0 (L) 4.0 - 10.5 K/uL   RBC 3.41 (L) 4.22 - 5.81 MIL/uL   Hemoglobin 10.8 (L) 13.0 - 17.0 g/dL   HCT 31.7 (L) 39.0 - 52.0 %   MCV 93.0 78.0 - 100.0 fL   MCH 31.7 26.0 - 34.0 pg   MCHC 34.1 30.0 - 36.0 g/dL   RDW 15.2 11.5 - 15.5 %   Platelets 18 (LL) 150 - 400 K/uL    Comment: REPEATED TO VERIFY SPECIMEN CHECKED FOR CLOTS PLATELET COUNT CONFIRMED BY SMEAR CRITICAL RESULT CALLED TO, READ BACK BY AND VERIFIED WITH: BMarina Gravel [id number] 07/23/2017 Mena Goes Performed at [facility], [address], [place], [place] [address]   Urinalysis, Routine w reflex microscopic     Status: Abnormal   Collection Time: 07/22/17 11:44 PM  Result Value Ref Range   Color, Urine YELLOW YELLOW   APPearance CLEAR CLEAR   Specific Gravity, Urine 1.021 1.005 - 1.030   pH 6.0 5.0 - 8.0   Glucose, UA NEGATIVE NEGATIVE mg/dL   Hgb urine dipstick SMALL (A) NEGATIVE   Bilirubin Urine NEGATIVE NEGATIVE   Ketones, ur NEGATIVE NEGATIVE mg/dL   Protein, ur  NEGATIVE NEGATIVE mg/dL   Nitrite NEGATIVE NEGATIVE  Leukocytes, UA NEGATIVE NEGATIVE   RBC / HPF 0-5 0 - 5 RBC/hpf   WBC, UA 0-5 0 - 5 WBC/hpf   Bacteria, UA NONE SEEN NONE SEEN   Squamous Epithelial / LPF NONE SEEN NONE SEEN   Mucus PRESENT     Comment: Performed at [facility], [place] [address] [place], [place] [address]  I-Stat Troponin, ED (not at [facility])     Status: None   Collection Time: 07/23/17  3:59 AM  Result Value Ref Range   Troponin i, poc 0.01 0.00 - 0.08 ng/mL   Comment 3            Comment: Due to the release kinetics of cTnI, a negative result within the first hours of the onset of symptoms does not rule out myocardial infarction with certainty. If myocardial infarction is still suspected, repeat the test at appropriate intervals.   Hepatitis B core antibody, total     Status: Abnormal   Collection Time: 08/01/17 11:27 AM  Result Value Ref Range   Hep B Core Total Ab REACTIVE (A) NON-REACTI  Hepatitis B core antibody, IgM     Status: None   Collection Time: 08/01/17 11:27 AM  Result Value Ref Range   Hep B C IgM NON-REACTIVE NON-REACTI  Hepatitis B surface antibody     Status: None   Collection Time: 08/01/17 11:27 AM  Result Value Ref Range   Hep B S Ab NON-REACTIVE NON-REACTI  Hepatitis B surface antigen     Status: Abnormal   Collection Time: 08/01/17 11:27 AM  Result Value Ref Range   Hepatitis B Surface Ag REACTIVE (A) NON-REACTI    Comment: HBSAG confirmation by neutralization was not required to be performed due to a signal/cutoff S/CO ratio result above the threshold required for neutralization.    . CBC Latest Ref Rng & Units 08/05/2017 08/05/2017 07/22/2017  WBC 4.0 - 10.3 K/uL 2.2(L) - 2.0(L)  Hemoglobin 13.0 - 17.1 g/dL 12.8(L) - 10.8(L)  Hematocrit 37.5 - 51.0 % 37.4(L) 37.4(L) 31.7(L)  Platelets 140 - 400 K/uL 16(L) - 18(LL)   . CBC    Component Value Date/Time   WBC 2.2 (L) 08/05/2017 1217   WBC 2.0 (L) 07/22/2017 2334   RBC  3.96 (L) 08/05/2017 1217   RBC 3.96 (L) 08/05/2017 1217   HGB 12.8 (L) 08/05/2017 1217   HGB 11.7 (L) 07/11/2017 1719   HCT 37.4 (L) 08/05/2017 1219   PLT 16 (L) 08/05/2017 1217   PLT 24 (LL) 07/11/2017 1719   MCV 94.4 08/05/2017 1217   MCV 96.1 07/15/2017 1054   MCV 92 07/11/2017 1719   MCH 32.3 08/05/2017 1217   MCHC 34.2 08/05/2017 1217   RDW 15.6 (H) 08/05/2017 1217   RDW 14.8 07/11/2017 1719   LYMPHSABS 0.6 (L) 08/05/2017 1217   MONOABS 0.2 08/05/2017 1217   EOSABS 0.1 08/05/2017 1217   BASOSABS 0.0 08/05/2017 1217    CMP Latest Ref Rng & Units 08/05/2017 07/22/2017 07/11/2017  Glucose 70 - 140 mg/dL 101 131(H) 85  BUN 7 - 26 mg/dL 16 19 13   Creatinine 0.70 - 1.30 mg/dL 0.92 1.08 0.86  Sodium 136 - 145 mmol/L 139 137 140  Potassium 3.5 - 5.1 mmol/L 4.2 3.8 4.0  Chloride 98 - 109 mmol/L 107 106 105  CO2 22 - 29 mmol/L 28 25 23   Calcium 8.4 - 10.4 mg/dL 8.7 8.1(L) 8.2(L)  Total Protein 6.4 - 8.3 g/dL 7.3 6.1(L) 7.1  Total Bilirubin 0.2 -  1.2 mg/dL 2.9(H) 2.2(H) 2.8(H)  Alkaline Phos 40 - 150 U/L 124 95 110  AST 5 - 34 U/L 63(H) 60(H) 55(H)  ALT 0 - 55 U/L 43 37 34     Component     Latest Ref Rng & Units 08/05/2017  Hgb A2 Quant     1.8 - 3.2 % 2.2  Hgb F Quant     0.0 - 2.0 % 0.0  Hgb S Quant     0.0 % 0.0  HGB C     0.0 % 0.0  Hgb A     96.4 - 98.8 % 97.8  HGB VARIANT     0.0 % 0.0  Please Note:      Comment  Iron     42 - 163 ug/dL 134  TIBC     202 - 409 ug/dL 164 (L)  Saturation Ratios     42 - 163 % 82  UIBC     ug/dL 29  Folate, Hemolysate     Not Estab. ng/mL 479.7  HCT     37.5 - 51.0 % 37.4 (L)  Folate, RBC     >498 ng/mL 1,283  Prothrombin Time     11.4 - 15.2 seconds 19.6 (H)  INR      1.67  Fibrinogen     210 - 475 mg/dL 127 (L)  APTT     24 - 36 seconds 49 (H)  AFP, Serum, Tumor Marker     0.0 - 8.3 ng/mL 13.7 (H)  LDH     125 - 245 U/L 245  Vitamin B12     180 - 914 pg/mL 1,355 (H)  Ferritin     22 - 316 ng/mL 385 (H)     RADIOGRAPHIC STUDIES: I have personally reviewed the radiological images as listed and agreed with the findings in the report. Dg Chest 2 View  Result Date: 07/11/2017 CLINICAL DATA:  Cough.  Leg swelling. EXAM: CHEST - 2 VIEW COMPARISON:  Chest x-ray dated November 11, 2016. FINDINGS: The heart size and mediastinal contours are within normal limits. Normal pulmonary vascularity. Stable biapical pleuroparenchymal scarring and reticulonodular densities in the right upper lobe and left lung base, consistent with history of tuberculosis. Stable blunting of the left costophrenic angle related to chronic pleural thickening. No focal consolidation, pleural effusion, or pneumothorax. No acute osseous abnormality. IMPRESSION: 1. Sequelae of prior tuberculosis. No active cardiopulmonary disease. Electronically Signed   By: Titus Dubin M.D.   On: 07/11/2017 17:02   Dg Abdomen Acute W/chest  Result Date: 07/23/2017 CLINICAL DATA:  Lower chest pain EXAM: DG ABDOMEN ACUTE W/ 1V CHEST COMPARISON:  07/11/2017, 11/11/2016 FINDINGS: Single-view chest demonstrates atelectasis or scar at the left base. Tiny left pleural effusion or thickening. Stable cardiomediastinal silhouette with mild vascular congestion. Stable pleural and parenchymal scarring at the apices. Supine and upright views of the abdomen demonstrate no free air beneath the diaphragm. Nonobstructed bowel-gas pattern with moderate stool in the right upper quadrant. No abnormal calcifications. IMPRESSION: 1. Stable radiographic appearance of the chest with apical pleural and parenchymal scarring and tiny left effusion or thickening. Subsegmental atelectasis or scar at the left base 2. Nonobstructed bowel-gas pattern Electronically Signed   By: Donavan Foil M.D.   On: 07/23/2017 03:58    CT AP W Contrast 02/11/16  IMPRESSION: Cirrhotic liver with splenomegaly and minimal ascites. Wall thickening of the ascending colon through hepatic flexure and of the  mid descending colon compatible with colitis,  question due to infection or inflammatory bowel disease. LEFT lower lobe scarring and old granulomatous changes question related to patient's history of TB.  ASSESSMENT & PLAN  Y Rajiv Parlato is a 47 y.o. [place] male with   #1 Pancytopenia  Low levels have been presents for at least the past 3 years Splenomegaly seen on prior scans and is overtly clinically palpable. Has experienced occasional epistaxis and black stool.   Thrombocytopenia gradually worsening PLT down to 16-18K range Anemia resolved - currently hgb improved to 12.8 Leucopenia.  Pancytopenia likely related to Liver cirrhosis + Splenomegaly with hypersplenism + Chronic Active Hep B infection. 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  -No treatment has been given thus far. Has associated coagulopathy with low fibrinogen levels and elevated PT and aPTT. -also noted to have elevated AFP tumor marker at 13.7 r/o [place] -Followed by ID Dr. Elna Breslow   PLAN -I discussed based on his medical history, scans and low blood count levels, his Liver cirrhosis and Hep B is most likely the cause of his pancytopenia.  -He has occasional epistaxis and black stool. He also has diffuse skin itching. He also has history of splenomegaly as seen on prior scans and that is clinically palpable. Overall signs of liver failure.  -Given this is primarily not a hematology issue, his cytopenia is related to his liver disease and should be treated by a ID for hep B and see a liver specialist.  -Given low plt, I advised him to avoid aspirin and other NSAIDS. I encouraged him to speak with his PCP before taking any medication given his condition.  -He is married with children, I advised he should get his wife and children tested for Hepatitis B. -Will get blood test  today for baseline labs and schedule bone marrow biopsy to rule out blood disorders or lymphoma as a cause to pancytopenia.   -I will get CT Abdomen to further evaluate splenomegaly and liver and to r/o focal lesions concerning for [facility] given increased AFP tumor markers. -would recommend PCP strongly consider hepatology referral. -might need to consider vit K replacement given coagulopathy - will defer to PCP. -if platelets < 10k or if actively bleeding will need consideration of Platelet transfusion and also consideration for possible Nplate use to help improve his platelet counts temporarily   Labs today CT abd in 1 week CT bone marrow biopsy in 1 week RTC with Dr Irene Limbo in 2 weeks   I spent 45 minutes counseling the patient face to face. The total time spent in the appointment was 60 minutes and more than 50% was on counseling and direct patient cares.    Sullivan Lone MD [place] AAHIVMS [facility] Hematology/Oncology Physician [facility]  (Office):       [phone] (Work cell):  [phone] (Fax):           [phone]  08/05/2017 11:22 AM   This document serves as a record of services personally performed by Sullivan Lone, MD. It was created on his behalf by Joslyn Devon, a trained medical scribe. The creation of this record is based on the scribe's personal observations and the provider's statements to them.    .I have reviewed the above documentation for accuracy and completeness, and I agree with the above. Brunetta Genera MD MS

## 2017-08-01 ENCOUNTER — Ambulatory Visit (INDEPENDENT_AMBULATORY_CARE_PROVIDER_SITE_OTHER): Payer: PRIVATE HEALTH INSURANCE | Admitting: Family

## 2017-08-01 ENCOUNTER — Encounter: Payer: Self-pay | Admitting: Family

## 2017-08-01 VITALS — BP 106/64 | HR 74 | Temp 98.0°F | Wt 131.0 lb

## 2017-08-01 DIAGNOSIS — K7460 Unspecified cirrhosis of liver: Secondary | ICD-10-CM

## 2017-08-01 DIAGNOSIS — R011 Cardiac murmur, unspecified: Secondary | ICD-10-CM

## 2017-08-01 DIAGNOSIS — B181 Chronic viral hepatitis B without delta-agent: Secondary | ICD-10-CM | POA: Diagnosis not present

## 2017-08-01 NOTE — Progress Notes (Signed)
Subjective:    Patient ID: Jose Snow, male    DOB: June 29, 1970, 46 y.o.   MRN: [id number]  Chief Complaint  Patient presents with  . Hepatitis B    HPI:  Jose Snow is a 47 y.o. male who presents today for initial evaluation and treatment of Hepatitis B. Jose Snow speaks primarily [place] and a Certified Medical Interpreter is present for the office visit.   Jose Snow is originally from [place] and came to the [place] in 2002. He was first diagnosed with Hepatitis B about 5 years ago. Has not been treated in the past. He works for [employer]. Denies any history of IVDU or blood transfusion. No family or personal history of liver disease. Currently has epigastric pain that waxes and wanes. Not aware of anyone that has Hepatitis B nor any sexual contact with anyone positive for Hepatitis B. Reports he used to drink about 2 alcoholic beverages per week.    No Known Allergies    Outpatient Medications Prior to Visit  Medication Sig Dispense Refill  . levocetirizine (XYZAL) 5 MG tablet Take 1 tablet (5 mg total) by mouth every evening. 30 tablet 0  . omeprazole (PRILOSEC) 20 MG capsule Take 1 capsule (20 mg total) by mouth daily. 30 capsule 0   No facility-administered medications prior to visit.      Past Medical History:  Diagnosis Date  . 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       Past Surgical History:  Procedure Laterality Date  . COMPLEX WOUND CLOSURE Right 12/15/2015   Procedure: COMPLEX WOUND CLOSURE;  Surgeon: Leanora Cover, MD;  Location: [place] [facility];  Service: Orthopedics;  Laterality: Right;  . I&D EXTREMITY Right 12/15/2015   Procedure: IRRIGATION AND DEBRIDEMENT AND REVISION AMPUTATION RIGHT RING FINGER;  Surgeon: Leanora Cover, MD;  Location: [place];  Service: Orthopedics;  Laterality: Right;      History reviewed. No pertinent family history.    Social History   Socioeconomic History  . Marital status: Married    Spouse  name: Not on file  . Number of children: Not on file  . Years of education: Not on file  . Highest education level: Not on file  Occupational History  . 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 Financial resource strain: Not on file  . Food insecurity:    Worry: Not on file    Inability: Not on file  . Transportation needs:    Medical: Not on file    Non-medical: Not on file  Tobacco Use  . Smoking status: Former Smoker    Packs/day: 0.50    Years: 8.00    Pack years: 4.00    Types: Cigarettes    Last attempt to quit: 04/08/1990    Years since quitting: 27.3  . Smokeless tobacco: Never Used  Substance and Sexual Activity  . Alcohol use: No  . Drug use: No  . Sexual activity: Yes  Lifestyle  . Physical activity:    Days per week: Not on file    Minutes per session: Not on file  . Stress: Not on file  Relationships  . Social connections:    Talks on phone: Not on file    Gets together: Not on file    Attends religious service: Not on file    Active member of club or organization: Not on file    Attends meetings  of clubs or organizations: Not on file    Relationship status: Not on file  . Intimate partner violence:    Fear of current or ex partner: Not on file    Emotionally abused: Not on file    Physically abused: Not on file    Forced sexual activity: Not on file  Other Topics Concern  . Not on file  Social History Narrative  . Not on file    Review of Systems  Constitutional: Negative for chills, diaphoresis, fever and unexpected weight change.  Respiratory: Negative for chest tightness and shortness of breath.   Cardiovascular: Negative for chest pain and leg swelling.  Gastrointestinal: Positive for diarrhea. Negative for abdominal distention, constipation, nausea and vomiting.  Hematological: Does not bruise/bleed easily.       Objective:    BP 106/64   Pulse 74   Temp 98 F (36.7 C) (Oral)   Wt 131 lb (59.4 kg)   BMI  23.65 kg/m  Nursing note and vital signs reviewed.  Physical Exam  Constitutional: He is oriented to person, place, and time. He appears well-developed and well-nourished. No distress.  Cardiovascular: Normal rate, regular rhythm and intact distal pulses.  Murmur heard.  Systolic murmur is present. Pulmonary/Chest: Effort normal and breath sounds normal.  Abdominal: Soft. Normal appearance and bowel sounds are normal. He exhibits distension. He exhibits no fluid wave and no ascites. There is hepatomegaly. There is tenderness in the epigastric area. There is no rigidity, no rebound, no guarding, no CVA tenderness, no tenderness at McBurney's point and negative Murphy's sign.  Neurological: He is alert and oriented to person, place, and time.  Skin: Skin is warm and dry.  Psychiatric: He has a normal mood and affect. His behavior is normal. Judgment and thought content normal.        Assessment & Plan:   Problem List Items Addressed This Visit      Digestive   Cirrhosis [facility])    Jose Snow was previously found to have liver cirrhosis from a CT scan that was performed in 2017. Hepatitis B is likely a contributing factor to the this. Unsure of previous alcohol intake as he indicated he used to drink 2 drinks per week. Given hepatomegaly on exam will refer to gastroenterology to establish and monitor cirrhosis.       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 Likely a significantly contributing factor to his current cirrhosis. Will recheck Hepatitis B labs today. 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 Additional follow up and treatment pending blood work.       Relevant Orders   Hepatitis B core antibody, total   Hepatitis B core antibody, IgM   Hepatitis B DNA,  ultraquantitative, PCR   Hepatitis B e antibody   Hepatitis B e antigen   Hepatitis B surface antibody   Hepatitis B surface antigen     Other   Heart murmur    Question new onset heart murmur with no chart documentation or patient knowledge. Will obtain TTE. Pending results recommend follow up with PCP/Cardiology.       Relevant Orders   ECHOCARDIOGRAM COMPLETE BUBBLE STUDY       I am having Y K. Sherr maintain his levocetirizine and omeprazole.   Follow-up:  Pending blood work results as needed.    Mauricio Po, [facility]

## 2017-08-01 NOTE — Assessment & Plan Note (Signed)
Question new onset heart murmur with no chart documentation or patient knowledge. Will obtain TTE. Pending results recommend follow up with PCP/Cardiology.

## 2017-08-01 NOTE — Assessment & Plan Note (Signed)
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 Likely a significantly contributing factor to his current cirrhosis. Will recheck Hepatitis B labs today. 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 Additional follow up and treatment pending blood work.

## 2017-08-01 NOTE — Patient Instructions (Addendum)
Nice to meet you.  We will check your blood work today to determine if you need treatment.   You have a heart murmur on your exam today and we will schedule you for an ultrasound to check it.  We have placed a referral to gastroenterology for further evaluation of your liver.   Plan for follow up pending blood work.

## 2017-08-01 NOTE — Assessment & Plan Note (Signed)
Jose Snow was previously found to have liver cirrhosis from a CT scan that was performed in 2017. Hepatitis B is likely a contributing factor to the this. Unsure of previous alcohol intake as he indicated he used to drink 2 drinks per week. Given hepatomegaly on exam will refer to gastroenterology to establish and monitor cirrhosis.

## 2017-08-03 LAB — HEPATITIS B SURFACE ANTIBODY,QUALITATIVE: HEP B S AB: NONREACTIVE

## 2017-08-03 LAB — HEPATITIS B CORE ANTIBODY, TOTAL: HEP B C TOTAL AB: REACTIVE — AB

## 2017-08-03 LAB — HEPATITIS B CORE ANTIBODY, IGM: Hep B C IgM: NONREACTIVE

## 2017-08-04 LAB — HEPATITIS B SURFACE ANTIGEN: HEP B S AG: REACTIVE — AB

## 2017-08-05 ENCOUNTER — Encounter: Payer: Self-pay | Admitting: Gastroenterology

## 2017-08-05 ENCOUNTER — Inpatient Hospital Stay: Payer: PRIVATE HEALTH INSURANCE | Attending: Hematology | Admitting: Hematology

## 2017-08-05 ENCOUNTER — Other Ambulatory Visit: Payer: Self-pay | Admitting: Hematology

## 2017-08-05 ENCOUNTER — Telehealth: Payer: Self-pay | Admitting: Hematology

## 2017-08-05 ENCOUNTER — Inpatient Hospital Stay: Payer: PRIVATE HEALTH INSURANCE

## 2017-08-05 ENCOUNTER — Encounter: Payer: Self-pay | Admitting: Hematology

## 2017-08-05 VITALS — BP 106/39 | HR 70 | Temp 97.9°F | Resp 18 | Ht 62.4 in | Wt 130.7 lb

## 2017-08-05 DIAGNOSIS — K760 Fatty (change of) liver, not elsewhere classified: Secondary | ICD-10-CM

## 2017-08-05 DIAGNOSIS — D689 Coagulation defect, unspecified: Secondary | ICD-10-CM

## 2017-08-05 DIAGNOSIS — D61818 Other pancytopenia: Secondary | ICD-10-CM

## 2017-08-05 DIAGNOSIS — B180 Chronic viral hepatitis B with delta-agent: Secondary | ICD-10-CM

## 2017-08-05 DIAGNOSIS — B1910 Unspecified viral hepatitis B without hepatic coma: Secondary | ICD-10-CM

## 2017-08-05 DIAGNOSIS — K7460 Unspecified cirrhosis of liver: Secondary | ICD-10-CM

## 2017-08-05 LAB — IRON AND TIBC
Iron: 134 ug/dL (ref 42–163)
Saturation Ratios: 82 % (ref 42–163)
TIBC: 164 ug/dL — ABNORMAL LOW (ref 202–409)
UIBC: 29 ug/dL

## 2017-08-05 LAB — HEPATITIS B DNA, ULTRAQUANTITATIVE, PCR
HEPATITIS B DNA (CALC): 6.09 {Log_IU}/mL — AB
Hepatitis B DNA: 1230000 IU/mL — ABNORMAL HIGH

## 2017-08-05 LAB — CBC WITH DIFFERENTIAL (CANCER CENTER ONLY)
Basophils Absolute: 0 10*3/uL (ref 0.0–0.1)
Basophils Relative: 0 %
EOS ABS: 0.1 10*3/uL (ref 0.0–0.5)
Eosinophils Relative: 4 %
HCT: 37.4 % — ABNORMAL LOW (ref 38.4–49.9)
HEMOGLOBIN: 12.8 g/dL — AB (ref 13.0–17.1)
LYMPHS PCT: 26 %
Lymphs Abs: 0.6 10*3/uL — ABNORMAL LOW (ref 0.9–3.3)
MCH: 32.3 pg (ref 27.2–33.4)
MCHC: 34.2 g/dL (ref 32.0–36.0)
MCV: 94.4 fL (ref 79.3–98.0)
MONOS PCT: 7 %
Monocytes Absolute: 0.2 10*3/uL (ref 0.1–0.9)
NEUTROS PCT: 63 %
Neutro Abs: 1.4 10*3/uL — ABNORMAL LOW (ref 1.5–6.5)
Platelet Count: 16 10*3/uL — ABNORMAL LOW (ref 140–400)
RBC: 3.96 MIL/uL — ABNORMAL LOW (ref 4.20–5.82)
RDW: 15.6 % — ABNORMAL HIGH (ref 11.0–14.6)
WBC Count: 2.2 10*3/uL — ABNORMAL LOW (ref 4.0–10.3)

## 2017-08-05 LAB — PROTIME-INR
INR: 1.67
PROTHROMBIN TIME: 19.6 s — AB (ref 11.4–15.2)

## 2017-08-05 LAB — CMP (CANCER CENTER ONLY)
ALT: 43 U/L (ref 0–55)
AST: 63 U/L — AB (ref 5–34)
Albumin: 2.9 g/dL — ABNORMAL LOW (ref 3.5–5.0)
Alkaline Phosphatase: 124 U/L (ref 40–150)
Anion gap: 4 (ref 3–11)
BUN: 16 mg/dL (ref 7–26)
CHLORIDE: 107 mmol/L (ref 98–109)
CO2: 28 mmol/L (ref 22–29)
CREATININE: 0.92 mg/dL (ref 0.70–1.30)
Calcium: 8.7 mg/dL (ref 8.4–10.4)
GFR, Est AFR Am: >60 mL/min (ref >60)
GFR, Estimated: >60 mL/min (ref >60)
Glucose, Bld: 101 mg/dL (ref 70–140)
Potassium: 4.2 mmol/L (ref 3.5–5.1)
SODIUM: 139 mmol/L (ref 136–145)
Total Bilirubin: 2.9 mg/dL — ABNORMAL HIGH (ref 0.2–1.2)
Total Protein: 7.3 g/dL (ref 6.4–8.3)

## 2017-08-05 LAB — FERRITIN: FERRITIN: 385 ng/mL — AB (ref 22–316)

## 2017-08-05 LAB — VITAMIN B12: Vitamin B-12: 1355 pg/mL — ABNORMAL HIGH (ref 180–914)

## 2017-08-05 LAB — RETICULOCYTES
RBC.: 3.96 MIL/uL — ABNORMAL LOW (ref 4.20–5.82)
RETIC CT PCT: 2.3 % — AB (ref 0.8–1.8)
Retic Count, Absolute: 91.1 10*3/uL (ref 34.8–93.9)

## 2017-08-05 LAB — APTT: APTT: 49 s — AB (ref 24–36)

## 2017-08-05 LAB — LACTATE DEHYDROGENASE: LDH: 245 U/L (ref 125–245)

## 2017-08-05 LAB — FIBRINOGEN: FIBRINOGEN: 127 mg/dL — AB (ref 210–475)

## 2017-08-05 NOTE — Telephone Encounter (Signed)
Scheduled appt per 4/30 los -gave patient AVS and calender per los. - central radiology to contact patient with ct scan .

## 2017-08-06 LAB — HEMOGLOBINOPATHY EVALUATION
HGB A2 QUANT: 2.2 % (ref 1.8–3.2)
HGB A: 97.8 % (ref 96.4–98.8)
HGB F QUANT: 0 % (ref 0.0–2.0)
Hgb C: 0 %
Hgb S Quant: 0 %
Hgb Variant: 0 %

## 2017-08-06 LAB — FOLATE RBC
FOLATE, HEMOLYSATE: 479.7 ng/mL
FOLATE, RBC: 1283 ng/mL (ref >498)
Hematocrit: 37.4 % — ABNORMAL LOW (ref 37.5–51.0)

## 2017-08-06 LAB — AFP TUMOR MARKER: AFP, SERUM, TUMOR MARKER: 13.7 ng/mL — AB (ref 0.0–8.3)

## 2017-08-06 LAB — HEPATITIS B E ANTIGEN: Hep B E Ag: NONREACTIVE

## 2017-08-06 LAB — HEPATITIS B E ANTIBODY: Hep B E Ab: REACTIVE — AB

## 2017-08-11 ENCOUNTER — Encounter (HOSPITAL_COMMUNITY): Payer: Self-pay | Admitting: *Deleted

## 2017-08-12 ENCOUNTER — Ambulatory Visit (HOSPITAL_COMMUNITY): Payer: PRIVATE HEALTH INSURANCE

## 2017-08-13 ENCOUNTER — Other Ambulatory Visit: Payer: Self-pay | Admitting: Radiology

## 2017-08-14 ENCOUNTER — Encounter (HOSPITAL_COMMUNITY): Payer: Self-pay

## 2017-08-14 ENCOUNTER — Ambulatory Visit (HOSPITAL_COMMUNITY)
Admission: RE | Admit: 2017-08-14 | Discharge: 2017-08-14 | Disposition: A | Discharge status: Home / Self Care | Payer: PRIVATE HEALTH INSURANCE | Source: Ambulatory Visit | Attending: Hematology | Admitting: Hematology

## 2017-08-14 DIAGNOSIS — R188 Other ascites: Secondary | ICD-10-CM | POA: Diagnosis not present

## 2017-08-14 DIAGNOSIS — K760 Fatty (change of) liver, not elsewhere classified: Secondary | ICD-10-CM

## 2017-08-14 DIAGNOSIS — Z79899 Other long term (current) drug therapy: Secondary | ICD-10-CM | POA: Diagnosis not present

## 2017-08-14 DIAGNOSIS — Z87891 Personal history of nicotine dependence: Secondary | ICD-10-CM | POA: Diagnosis not present

## 2017-08-14 DIAGNOSIS — K7460 Unspecified cirrhosis of liver: Secondary | ICD-10-CM | POA: Insufficient documentation

## 2017-08-14 DIAGNOSIS — D61818 Other pancytopenia: Secondary | ICD-10-CM | POA: Diagnosis not present

## 2017-08-14 DIAGNOSIS — R161 Splenomegaly, not elsewhere classified: Secondary | ICD-10-CM | POA: Insufficient documentation

## 2017-08-14 DIAGNOSIS — K739 Chronic hepatitis, unspecified: Secondary | ICD-10-CM | POA: Insufficient documentation

## 2017-08-14 LAB — CBC WITH DIFFERENTIAL/PLATELET
Basophils Absolute: 0 10*3/uL (ref 0.0–0.1)
Basophils Relative: 1 %
Eosinophils Absolute: 0.1 10*3/uL (ref 0.0–0.7)
Eosinophils Relative: 6 %
HCT: 37.1 % — ABNORMAL LOW (ref 39.0–52.0)
HEMOGLOBIN: 12.7 g/dL — AB (ref 13.0–17.0)
LYMPHS ABS: 0.6 10*3/uL — AB (ref 0.7–4.0)
LYMPHS PCT: 31 %
MCH: 32.5 pg (ref 26.0–34.0)
MCHC: 34.2 g/dL (ref 30.0–36.0)
MCV: 94.9 fL (ref 78.0–100.0)
MONOS PCT: 8 %
Monocytes Absolute: 0.2 10*3/uL (ref 0.1–1.0)
NEUTROS PCT: 54 %
Neutro Abs: 1.1 10*3/uL — ABNORMAL LOW (ref 1.7–7.7)
Platelets: 22 10*3/uL — CL (ref 150–400)
RBC: 3.91 MIL/uL — AB (ref 4.22–5.81)
RDW: 15.8 % — ABNORMAL HIGH (ref 11.5–15.5)
WBC: 2.1 10*3/uL — AB (ref 4.0–10.5)

## 2017-08-14 LAB — PROTIME-INR
INR: 1.74
Prothrombin Time: 20.2 seconds — ABNORMAL HIGH (ref 11.4–15.2)

## 2017-08-14 MED ORDER — FENTANYL CITRATE (PF) 100 MCG/2ML IJ SOLN
INTRAMUSCULAR | Status: AC
  Filled 2017-08-14: qty 4

## 2017-08-14 MED ORDER — FENTANYL CITRATE (PF) 100 MCG/2ML IJ SOLN
INTRAMUSCULAR | Status: AC | PRN
  Administered 2017-08-14 (×2): 50 ug via INTRAVENOUS

## 2017-08-14 MED ORDER — LIDOCAINE HCL (PF) 1 % IJ SOLN
INTRAMUSCULAR | Status: AC | PRN
  Administered 2017-08-14: 30 mL

## 2017-08-14 MED ORDER — SODIUM CHLORIDE 0.9 % IV SOLN
INTRAVENOUS | Status: DC
  Administered 2017-08-14: 09:00:00 via INTRAVENOUS

## 2017-08-14 NOTE — Consult Note (Signed)
Chief Complaint: Patient was seen in consultation today for CT guided bone marrow biopsy  Referring Physician(s): Brunetta Genera  Supervising Physician: Sandi Mariscal  Patient Status: [facility] - Out-pt  History of Present Illness: Jose Snow is a 47 y.o. male with history of hepatitis B, cirrhosis, splenomegaly and chronic pancytopenia who presents today for CT-guided bone marrow biopsy for further evaluation.   Past Medical History:  Diagnosis Date  . 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     Past Surgical History:  Procedure Laterality Date  . COMPLEX WOUND CLOSURE Right 12/15/2015   Procedure: COMPLEX WOUND CLOSURE;  Surgeon: Leanora Cover, MD;  Location: [place];  Service: Orthopedics;  Laterality: Right;  . I&D EXTREMITY Right 12/15/2015   Procedure: IRRIGATION AND DEBRIDEMENT AND REVISION AMPUTATION RIGHT RING FINGER;  Surgeon: Leanora Cover, MD;  Location: [place];  Service: Orthopedics;  Laterality: Right;    Allergies: Patient has no known allergies.  Medications: Prior to Admission medications   Medication Sig Start Date End Date Taking? Authorizing Provider  levocetirizine (XYZAL) 5 MG tablet Take 1 tablet (5 mg total) by mouth every evening. 07/11/17  Yes Tereasa Coop, PA-C  omeprazole (PRILOSEC) 20 MG capsule Take 1 capsule (20 mg total) by mouth daily. 07/23/17  Yes Horton, Barbette Hair, MD     History reviewed. No pertinent family history.  Social History   Socioeconomic History  . Marital status: Married    Spouse name: Not on file  . Number of children: Not on file  . Years of education: Not on file  . Highest education level: Not on file  Occupational History  . 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 Financial resource strain: Not on file  . Food insecurity:    Worry: Not on file    Inability: Not on file  . Transportation needs:    Medical: Not on file    Non-medical: Not on file  Tobacco Use  .  Smoking status: Former Smoker    Packs/day: 0.50    Years: 8.00    Pack years: 4.00    Types: Cigarettes    Last attempt to quit: 04/08/1990    Years since quitting: 27.3  . Smokeless tobacco: Never Used  Substance and Sexual Activity  . Alcohol use: No  . Drug use: No  . Sexual activity: Yes  Lifestyle  . Physical activity:    Days per week: Not on file    Minutes per session: Not on file  . Stress: Not on file  Relationships  . Social connections:    Talks on phone: Not on file    Gets together: Not on file    Attends religious service: Not on file    Active member of club or organization: Not on file    Attends meetings of clubs or organizations: Not on file    Relationship status: Not on file  Other Topics Concern  . Not on file  Social History Narrative  . Not on file      Review of Systems denies fever, headache, chest pain, dyspnea, cough, abdominal/back pain, nausea, vomiting or bleeding.  Vital Signs: BP (!) 104/53 (BP Location: Right Arm)   Pulse 73   Temp 97.8 F (36.6 C) (Oral)   Resp 18   SpO2 100%   Physical Exam awake, alert.  Chest clear to auscultation bilaterally.  Heart with regular rate and rhythm.  Abdomen soft, positive bowel sounds, nontender.  Trace pretibial edema bilaterally.  Imaging: Dg Abdomen Acute W/chest  Result Date: 07/23/2017 CLINICAL DATA:  Lower chest pain EXAM: DG ABDOMEN ACUTE W/ 1V CHEST COMPARISON:  07/11/2017, 11/11/2016 FINDINGS: Single-view chest demonstrates atelectasis or scar at the left base. Tiny left pleural effusion or thickening. Stable cardiomediastinal silhouette with mild vascular congestion. Stable pleural and parenchymal scarring at the apices. Supine and upright views of the abdomen demonstrate no free air beneath the diaphragm. Nonobstructed bowel-gas pattern with moderate stool in the right upper quadrant. No abnormal calcifications. IMPRESSION: 1. Stable radiographic appearance of the chest with apical pleural  and parenchymal scarring and tiny left effusion or thickening. Subsegmental atelectasis or scar at the left base 2. Nonobstructed bowel-gas pattern Electronically Signed   By: Donavan Foil M.D.   On: 07/23/2017 03:58    Labs:  CBC: Recent Labs    11/10/16 2217 07/11/17 1719 07/15/17 1054 07/22/17 2334 08/05/17 1217 08/05/17 1219  WBC 7.0 2.8* 2.4* 2.0* 2.2*  --   HGB 13.0 11.7* 11.7* 10.8* 12.8*  --   HCT 38.1* 33.7* 36.0* 31.7* 37.4* 37.4*  PLT 43* 24*  --  18* 16*  --     COAGS: Recent Labs    08/05/17 1217  INR 1.67  APTT 49*    BMP: Recent Labs    11/10/16 2217 07/11/17 1719 07/22/17 2334 08/05/17 1217  NA 134* 140 137 139  K 3.8 4.0 3.8 4.2  CL 102 105 106 107  CO2 28 23 25 28   GLUCOSE 239* 85 131* 101  BUN 15 13 19 16   CALCIUM 8.1* 8.2* 8.1* 8.7  CREATININE 1.06 0.86 1.08 0.92  GFRNONAA >60 102 >60 >60  GFRAA >60 118 >60 >60    LIVER FUNCTION TESTS: Recent Labs    11/10/16 2217 07/11/17 1719 07/22/17 2334 08/05/17 1217  BILITOT 1.9* 2.8* 2.2* 2.9*  AST 38 55* 60* 63*  ALT 30 34 37 43  ALKPHOS 96 110 95 124  PROT 6.8 7.1 6.1* 7.3  ALBUMIN 2.6* 3.0* 2.4* 2.9*    TUMOR MARKERS: No results for input(s): AFPTM, CEA, CA199, CHROMGRNA in the last 8760 hours.  Assessment and Plan: 47 y.o. male with history of hepatitis B, cirrhosis, splenomegaly and chronic pancytopenia who presents today for CT-guided bone marrow biopsy for further evaluation.Risks and benefits discussed with the patient via interpreter including, but not limited to bleeding, infection, damage to adjacent structures or low yield requiring additional tests.  All of the patient's questions were answered, patient is agreeable to proceed. Consent signed and in chart.  Patient mistakenly drank oral contrast earlier this morning because he thought he was having CT scan of abdomen and pelvis , therefore he will receive IV fentanyl only for procedure.   Thank you for this interesting  consult.  I greatly enjoyed meeting Zamire Whitehurst and look forward to participating in their care.  A copy of this report was sent to the requesting provider on this date.  Electronically Signed: D. Rowe Robert, PA-C 08/14/2017, 8:20 AM   I spent a total of 25 minutes    in face to face in clinical consultation, greater than 50% of which was counseling/coordinating care for CT-guided bone marrow biopsy

## 2017-08-14 NOTE — Procedures (Signed)
Pre-procedure Diagnosis: splenomegaly and chronic pancytopenia  Post-procedure Diagnosis: Same  Technically successful CT guided bone marrow aspiration and biopsy of left iliac crest.   Complications: None Immediate  EBL: None  SignedSandi Mariscal Pager: [phone] 08/14/2017, 9:55 AM

## 2017-08-14 NOTE — Discharge Instructions (Signed)
Moderate Conscious Sedation, Adult, Care After °These instructions provide you with information about caring for yourself after your procedure. Your health care provider may also give you more specific instructions. Your treatment has been planned according to current medical practices, but problems sometimes occur. Call your health care provider if you have any problems or questions after your procedure. °What can I expect after the procedure? °After your procedure, it is common: °· To feel sleepy for several hours. °· To feel clumsy and have poor balance for several hours. °· To have poor judgment for several hours. °· To vomit if you eat too soon. ° °Follow these instructions at home: °For at least 24 hours after the procedure: ° °· Do not: °? Participate in activities where you could fall or become injured. °? Drive. °? Use heavy machinery. °? Drink alcohol. °? Take sleeping pills or medicines that cause drowsiness. °? Make important decisions or sign legal documents. °? Take care of children on your own. °· Rest. °Eating and drinking °· Follow the diet recommended by your health care provider. °· If you vomit: °? Drink water, juice, or soup when you can drink without vomiting. °? Make sure you have little or no nausea before eating solid foods. °General instructions °· Have a responsible adult stay with you until you are awake and alert. °· Take over-the-counter and prescription medicines only as told by your health care provider. °· If you smoke, do not smoke without supervision. °· Keep all follow-up visits as told by your health care provider. This is important. °Contact a health care provider if: °· You keep feeling nauseous or you keep vomiting. °· You feel light-headed. °· You develop a rash. °· You have a fever. °Get help right away if: °· You have trouble breathing. °This information is not intended to replace advice given to you by your health care provider. Make sure you discuss any questions you have  with your health care provider. °Document Released: 01/13/2013 Document Revised: 08/28/2015 Document Reviewed: 07/15/2015 °Elsevier Interactive Patient Education © 2018 Elsevier Inc. ° ° °Bone Marrow Aspiration and Bone Marrow Biopsy, Adult, Care After °This sheet gives you information about how to care for yourself after your procedure. Your health care provider may also give you more specific instructions. If you have problems or questions, contact your health care provider. °What can I expect after the procedure? °After the procedure, it is common to have: °· Mild pain and tenderness. °· Swelling. °· Bruising. ° °Follow these instructions at home: °· Take over-the-counter or prescription medicines only as told by your health care provider. °· Do not take baths, swim, or use a hot tub until your health care provider approves. Ask if you can take a shower or have a sponge bath.  You may shower tomorrow. °· Follow instructions from your health care provider about how to take care of the puncture site. Make sure you: °? Wash your hands with soap and water before you change your bandage (dressing). If soap and water are not available, use hand sanitizer. °? Change your dressing as told by your health care provider.  You may remove your dressing tomorrow. °· Check your puncture site every day for signs of infection. Check for: °? More redness, swelling, or pain. °? More fluid or blood. °? Warmth. °? Pus or a bad smell. °· Return to your normal activities as told by your health care provider. Ask your health care provider what activities are safe for you. °· Do not drive   for 24 hours if you were given a medicine to help you relax (sedative). °· Keep all follow-up visits as told by your health care provider. This is important. °Contact a health care provider if: °· You have more redness, swelling, or pain around the puncture site. °· You have more fluid or blood coming from the puncture site. °· Your puncture site feels  warm to the touch. °· You have pus or a bad smell coming from the puncture site. °· You have a fever. °· Your pain is not controlled with medicine. °This information is not intended to replace advice given to you by your health care provider. Make sure you discuss any questions you have with your health care provider. °Document Released: 10/12/2004 Document Revised: 10/13/2015 Document Reviewed: 09/06/2015 °Elsevier Interactive Patient Education © 2018 Elsevier Inc. ° °

## 2017-08-22 ENCOUNTER — Ambulatory Visit (HOSPITAL_COMMUNITY): Payer: PRIVATE HEALTH INSURANCE

## 2017-08-25 ENCOUNTER — Encounter (HOSPITAL_COMMUNITY): Payer: Self-pay | Admitting: Hematology

## 2017-08-25 LAB — CHROMOSOME ANALYSIS, BONE MARROW

## 2017-08-28 ENCOUNTER — Other Ambulatory Visit: Payer: Self-pay | Admitting: Family

## 2017-08-28 ENCOUNTER — Telehealth: Payer: Self-pay | Admitting: Behavioral Health

## 2017-08-28 DIAGNOSIS — B181 Chronic viral hepatitis B without delta-agent: Secondary | ICD-10-CM

## 2017-08-28 MED ORDER — TENOFOVIR ALAFENAMIDE FUMARATE 25 MG PO TABS: 1.0000 | ORAL_TABLET | Freq: Every day | ORAL | 2 refills | Status: DC

## 2017-08-28 NOTE — Telephone Encounter (Signed)
Called patient however was unable to get an interpreter on the line because [place] Bunong/Mnong was not available. Left a voicemail on patient's answering machine to call the office back.  Will try again later. Pricilla Riffle RN

## 2017-08-28 NOTE — Telephone Encounter (Signed)
-----   Message from [place], [place] sent at 08/28/2017 11:21 AM EDT ----- Please inform patient that based on his blood work he does require treatment for Hepatitis B. I will send in a prescription for [place] for him to start. I would like to schedule a follow up appointment in 1 month. It is important that he DOES NOT STOP TAKING the medication after he starts as this may cause a flare of his hepatitis B.

## 2017-08-29 ENCOUNTER — Telehealth: Payer: Self-pay

## 2017-08-29 MED ORDER — TENOFOVIR DISOPROXIL FUMARATE 300 MG PO TABS: 300.0000 mg | ORAL_TABLET | Freq: Every day | ORAL | 6 refills | Status: DC

## 2017-08-29 NOTE — Telephone Encounter (Signed)
PA for [facility] came in today for pt. Attempted pa VIA Covermymeds. PA was denied by insurance. [place]

## 2017-08-29 NOTE — Telephone Encounter (Signed)
Pharmacy was notified about pa denial and alternative being sent in [place], [place]

## 2017-08-29 NOTE — Addendum Note (Signed)
Addended by: Mauricio Po D on: 08/29/2017 01:57 PM   Modules accepted: Orders

## 2017-08-29 NOTE — Telephone Encounter (Signed)
Prescription for Tenofovir (TDF) sent to pharmacy in place secondary to declined TAF.

## 2017-09-16 ENCOUNTER — Encounter: Payer: Self-pay | Admitting: Gastroenterology

## 2017-09-16 ENCOUNTER — Encounter (INDEPENDENT_AMBULATORY_CARE_PROVIDER_SITE_OTHER): Payer: Self-pay

## 2017-09-16 ENCOUNTER — Other Ambulatory Visit: Payer: Self-pay

## 2017-09-16 ENCOUNTER — Ambulatory Visit (INDEPENDENT_AMBULATORY_CARE_PROVIDER_SITE_OTHER): Payer: PRIVATE HEALTH INSURANCE | Admitting: Gastroenterology

## 2017-09-16 VITALS — BP 106/60 | HR 72 | Ht 62.5 in | Wt 135.1 lb

## 2017-09-16 DIAGNOSIS — D696 Thrombocytopenia, unspecified: Secondary | ICD-10-CM

## 2017-09-16 DIAGNOSIS — K7460 Unspecified cirrhosis of liver: Secondary | ICD-10-CM | POA: Diagnosis not present

## 2017-09-16 MED ORDER — PEG 3350-KCL-NA BICARB-NACL 420 G PO SOLR: 4000.0000 mL | Freq: Once | ORAL | 0 refills | Status: AC

## 2017-09-16 MED ORDER — SODIUM CHLORIDE 0.9 % IV SOLN: Freq: Once | INTRAVENOUS | Status: DC

## 2017-09-16 MED ORDER — VITAMIN K 100 MCG PO TABS: 100.0000 ug | ORAL_TABLET | Freq: Every day | ORAL | 0 refills | Status: DC

## 2017-09-16 NOTE — Patient Instructions (Addendum)
Referral to [facility] clinic, transplant evaluation (MELD 16 cirrhosis from Chronic hep B).  To evaluate for transplant and also assume care of hep B treatment.  Start lasix 29m once daily. Start aldactone 429monce daily.  Both disp one month with 11 refills. Needs BMET next week.  You will be set up for a colonoscopy.  IN about 1 month. You will be set up for an upper endoscopy. [facility].  He needs 2 unit platelet transfusion the day prior and CBC, INR at admit to endo that day.  10/15/17 9 am Platelet transfusion at [address].   Vitamin K oral 1091mill, one pill daily for 3 days.  Then INR next week.  Dr. JacArdis Hughsll communicate with Dr. CalElna Breslowout the plan above, feels it best to coordinate care with [facility].  Needs to start Hep B treatment.  It is important that you have a relatively low salt diet.  High salt diet can cause fluid to accumulate in your legs, abdomen and even around your lungs. You should try to avoid NSAID type over the counter pain medicines as best as possible. Tylenol is safe to take for 'routine' aches and pains, but never take more than 1/2 the dose suggested on the package instructions (never more than 2 grams per day). Avoid alcohol.  Please return to see Dr. JacArdis Hughs 2 months.  Normal BMI (Body Mass Index- based on height and weight) is between 19 and 25. Your BMI today is Body mass index is 24.32 kg/m. . PMarland Kitchenease consider follow up  regarding your BMI with your Primary Care Provider.

## 2017-09-16 NOTE — Progress Notes (Signed)
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  There is a professional interpreter present but even with that the language barrier is significant.  A woman was also with him who acts as his care partner,  it seems like they go way back, I think she may be his employer.    Chief complaint is chronic hepatitis B  He has known he has chronic hepatitis B for at least several years.  I was able to find an inpatient consult by [facility] gastroenterology dating back at least 3 or 4 years with regards to this.  I am not sure if he ever followed up with them.  He is recently been considered for hepatitis B treatment and his infectious disease doctor sent her to me given his cirrhosis.  He does have fatigue, he has never had overt GI bleeding or obvious encephalopathy from what I can tell given the language restrictions.    Old Data Reviewed: Imaging dating back to 2016 suggests nodular, cirrhotic liver.  Most recent CT scan (without IV contrast) May 2019 clearly shows cirrhosis, splenomegaly, mild ascites, signs of portal venous hypertension.  Blood work May 2019 INR 1.7, hemoglobin 12.7, platelets around 20,000, Cr 0.9, AST 63, ALT 43, T bili 2.9  (MELD score is 16), AFP 13.7 (elevated)  Labs 2019: Hepatitis B E antibody reactive, hepatitis B core total antibody reactive, hepatitis B surface antigen reactive, hepatitis B E antigen nonreactive (2 years ago this was positive) hepatitis B surface antibody  Nonreactive, hepatitis B DNA 1,200,000 IUs/mL  Labs 2016: Ferritin slightly elevated, HIV negative, hepatitis C virus IgG negative, anti-smooth muscle antibody IgG negative, ANA negative,  HCV Ab negative, ceruloplamin 12.3 (slightly low), A1A normal.  I reviewed a consult note from [facility] gastroenterology while he was hospitalized in 2016 or 17.  It does not look like he has  followed up with them   Review of systems: Pertinent positive and negative review of systems were noted in the above HPI section. All other review negative.   Past Medical History:  Diagnosis Date  . Cirrhosis ([place])   . Hepatitis B   . 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     Past Surgical History:  Procedure Laterality Date  . COMPLEX WOUND CLOSURE Right 12/15/2015   Procedure: COMPLEX WOUND CLOSURE;  Surgeon: Leanora Cover, MD;  Location: [place];  Service: Orthopedics;  Laterality: Right;  . I&D EXTREMITY Right 12/15/2015   Procedure: IRRIGATION AND DEBRIDEMENT AND REVISION AMPUTATION RIGHT RING FINGER;  Surgeon: Leanora Cover, MD;  Location: [place];  Service: Orthopedics;  Laterality: Right;    Current Outpatient Medications  Medication Sig Dispense Refill  . levocetirizine (XYZAL) 5 MG tablet Take 1 tablet (5 mg total) by mouth every evening. 30 tablet 0  . omeprazole (PRILOSEC) 20 MG capsule Take 1 capsule (20 mg total) by mouth daily. 30 capsule 0   No current facility-administered medications for this visit.     Allergies as of 09/16/2017  . (No Known Allergies)    History reviewed. No pertinent family history.  Social History   Socioeconomic History  . Marital status: Married    Spouse name: Not on file  . Number of children: 4  . Years of education: Not on file  . Highest education level: Not on file  Occupational History  .  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 Financial resource strain: Not on file  . Food insecurity:    Worry: Not on file    Inability: Not on file  . Transportation needs:    Medical: Not on file    Non-medical: Not on file  Tobacco Use  . Smoking status: Former Smoker    Packs/day: 0.50    Years: 8.00    Pack years: 4.00    Types: Cigarettes    Last attempt to quit: 04/08/1990    Years since quitting: 27.4  . Smokeless tobacco: Never Used  Substance and Sexual Activity  . Alcohol use:  No  . Drug use: No  . Sexual activity: Yes  Lifestyle  . Physical activity:    Days per week: Not on file    Minutes per session: Not on file  . Stress: Not on file  Relationships  . Social connections:    Talks on phone: Not on file    Gets together: Not on file    Attends religious service: Not on file    Active member of club or organization: Not on file    Attends meetings of clubs or organizations: Not on file    Relationship status: Not on file  . Intimate partner violence:    Fear of current or ex partner: Not on file    Emotionally abused: Not on file    Physically abused: Not on file    Forced sexual activity: Not on file  Other Topics Concern  . Not on file  Social History Narrative  . Not on file     Physical Exam: BP 106/60 (BP Location: Left Arm, Patient Position: Sitting, Cuff Size: Normal)   Pulse 72   Ht 5' 2.5" (1.588 m) Comment: height measured without shoes  Wt 135 lb 2 oz (61.3 kg)   BMI 24.32 kg/m  Constitutional: Thin Asian man Psychiatric: alert and oriented x3 Eyes: extraocular movements intact Mouth: oral pharynx moist, no lesions Neck: supple no lymphadenopathy Cardiovascular: heart regular rate and rhythm Lungs: clear to auscultation bilaterally Abdomen: soft, nontender, nondistended, no obvious ascites, no peritoneal signs, normal bowel sounds Extremities: Trace to 1+ lower extremity edema bilaterally Skin: no lesions on visible extremities   Assessment and plan: 47 y.o. male with  chronic hepatitis B, cirrhosis  There is a serious language barrier here which will make care difficult going forward.  Translator on site helped mostly but not completely.  Most helpful was a close family friend of his who seems to be assuming role as a primary caretaker for him as well.  He has chronic hepatitis B and cirrhosis with a recent meld of 16.  He has very low platelets around 20,000.  He has mild edema in his legs and some ascites on recent  imaging although not clinically evident.  I am going to start him on low-dose diuretics 20 mg Lasix and 50 mg Aldactone.  His INR was 1.7 and so I am giving him some oral vitamin K to see if we can reverse that a bit.  He needs varices screening as well as colon cancer screening.  I recommended upper endoscopy and colonoscopy.  Those should be done at [facility] and only after some INR reversal as well as platelet transfusion.  This will take coordinating of care obviously.  He has not started hepatitis B treatment.  I am going to communicate with his infectious disease  doctor about that.  I think he needs evaluation at [facility] satellite liver clinic as his meld is already 33.  He may eventually need a liver transplant.  I think it may be easiest for him and his caregiver if he use the [facility] as the place for his hepatitis B treatment as well to streamline care as best as possible.  Will plan to follow in the office in 2 months, sooner for the EGD/Colonoscopy above.  Please see the "Patient Instructions" section for addition details about the plan.   Owens Loffler, MD [facility] 09/16/2017, 9:19 AM  Cc: Golden Circle, FNP

## 2017-09-17 ENCOUNTER — Telehealth: Payer: Self-pay | Admitting: Gastroenterology

## 2017-09-17 ENCOUNTER — Other Ambulatory Visit: Payer: Self-pay | Admitting: Gastroenterology

## 2017-09-17 MED ORDER — OMEPRAZOLE 20 MG PO CPDR: 20.0000 mg | DELAYED_RELEASE_CAPSULE | Freq: Every day | ORAL | 3 refills | Status: DC

## 2017-09-17 NOTE — Telephone Encounter (Signed)
The pt demographics have been faxed as requested

## 2017-09-17 NOTE — Telephone Encounter (Signed)
Jose Snow calling states pt only needs generic Prilosec sent to [facility] [address].

## 2017-09-17 NOTE — Telephone Encounter (Signed)
Spoke to Jose Snow sent in Omeprazole

## 2017-09-17 NOTE — Telephone Encounter (Signed)
Stacey from [facility] called stating that they received a referral for this patient but it is missing his demographics. Pls fax demo at [phone]

## 2017-09-18 ENCOUNTER — Other Ambulatory Visit: Payer: Self-pay | Admitting: Gastroenterology

## 2017-09-18 MED ORDER — PEG 3350-KCL-NA BICARB-NACL 420 G PO SOLR: 4000.0000 mL | ORAL | 0 refills | Status: DC

## 2017-09-22 NOTE — Telephone Encounter (Signed)
Patient friend [name] states the three medications sent in by Dr.Jacobs were not at the [place] for pt. [name] asking all the medications be resent besides the prep for colon.

## 2017-09-23 ENCOUNTER — Telehealth: Payer: Self-pay | Admitting: Gastroenterology

## 2017-09-23 ENCOUNTER — Other Ambulatory Visit: Payer: Self-pay

## 2017-09-23 MED ORDER — FUROSEMIDE 20 MG PO TABS: 20.0000 mg | ORAL_TABLET | Freq: Every day | ORAL | 11 refills | Status: DC

## 2017-09-23 MED ORDER — SPIRONOLACTONE 50 MG PO TABS: 50.0000 mg | ORAL_TABLET | Freq: Every day | ORAL | 11 refills | Status: DC

## 2017-09-25 ENCOUNTER — Other Ambulatory Visit: Payer: Self-pay

## 2017-09-25 ENCOUNTER — Ambulatory Visit (HOSPITAL_COMMUNITY): Payer: PRIVATE HEALTH INSURANCE | Attending: Cardiology

## 2017-09-25 DIAGNOSIS — R011 Cardiac murmur, unspecified: Secondary | ICD-10-CM

## 2017-09-25 DIAGNOSIS — B1910 Unspecified viral hepatitis B without hepatic coma: Secondary | ICD-10-CM | POA: Insufficient documentation

## 2017-09-25 DIAGNOSIS — Z87891 Personal history of nicotine dependence: Secondary | ICD-10-CM | POA: Insufficient documentation

## 2017-09-25 DIAGNOSIS — K7460 Unspecified cirrhosis of liver: Secondary | ICD-10-CM | POA: Diagnosis not present

## 2017-09-25 DIAGNOSIS — I361 Nonrheumatic tricuspid (valve) insufficiency: Secondary | ICD-10-CM | POA: Diagnosis not present

## 2017-09-26 ENCOUNTER — Telehealth: Payer: Self-pay | Admitting: Gastroenterology

## 2017-09-29 ENCOUNTER — Other Ambulatory Visit (INDEPENDENT_AMBULATORY_CARE_PROVIDER_SITE_OTHER): Payer: PRIVATE HEALTH INSURANCE

## 2017-09-29 DIAGNOSIS — K7460 Unspecified cirrhosis of liver: Secondary | ICD-10-CM

## 2017-09-29 LAB — PROTIME-INR
INR: 1.8 ratio — AB (ref 0.8–1.0)
PROTHROMBIN TIME: 20.3 s — AB (ref 9.6–13.1)

## 2017-10-13 ENCOUNTER — Telehealth: Payer: Self-pay | Admitting: Gastroenterology

## 2017-10-13 DIAGNOSIS — R197 Diarrhea, unspecified: Secondary | ICD-10-CM

## 2017-10-13 DIAGNOSIS — K7460 Unspecified cirrhosis of liver: Secondary | ICD-10-CM

## 2017-10-13 NOTE — Telephone Encounter (Signed)
Appointment scheduled 12-05-17

## 2017-10-13 NOTE — Telephone Encounter (Signed)
Dr Ardis Hughs the interpreter for the pt called and states that for the past several days the pt has had diarrhea and vomiting.  She is not certain he can have the endo colon as planned this week.  He is also scheduled for platelets and labs on 10/15/17.  He has an appt tomorrow (10/14/17) at [facility].  Does he need to be rescheduled or keep appt as planned?

## 2017-10-14 NOTE — Telephone Encounter (Signed)
[place], thanks. Please cancel his upcoming colo/egd. (please put that other patient JJ in the spot for EUS instead, she has been discharged from [facility] so it will be OP case).  Jose Snow Does not need the plt transfusion then as well.    Any chance he can come in for an office appt later that same day instead (Thursday 11th, I can be back from [facility] by 3:30 probably). I'd like him to have repeat labs tomorrow as well (cmet, cbc, inr, stool testing for GI pathogen panel).  Thanks

## 2017-10-14 NOTE — Telephone Encounter (Signed)
I have spoken to Jose Snow and made her aware of the appt for 7/11 and the lab appt for tomorrow.  She was also notified that the procedures for endo colon and platelets were cancelled.  The pt has been advised of the information and verbalized understanding.

## 2017-10-14 NOTE — Telephone Encounter (Signed)
EGD and platelets cancelled.  Left message on machine of interpreter to call back for possible appt this Thursday 7/11 and for labs and stool testing. (already added to Epic)

## 2017-10-15 ENCOUNTER — Encounter (HOSPITAL_COMMUNITY): Payer: PRIVATE HEALTH INSURANCE

## 2017-10-15 ENCOUNTER — Other Ambulatory Visit (INDEPENDENT_AMBULATORY_CARE_PROVIDER_SITE_OTHER): Payer: PRIVATE HEALTH INSURANCE

## 2017-10-15 DIAGNOSIS — R197 Diarrhea, unspecified: Secondary | ICD-10-CM | POA: Diagnosis not present

## 2017-10-15 DIAGNOSIS — K7460 Unspecified cirrhosis of liver: Secondary | ICD-10-CM | POA: Diagnosis not present

## 2017-10-15 LAB — CBC WITH DIFFERENTIAL/PLATELET
Basophils Absolute: 0 10*3/uL (ref 0.0–0.1)
Basophils Relative: 0.5 % (ref 0.0–3.0)
EOS ABS: 0.2 10*3/uL (ref 0.0–0.7)
Eosinophils Relative: 7.3 % — ABNORMAL HIGH (ref 0.0–5.0)
HCT: 33.6 % — ABNORMAL LOW (ref 39.0–52.0)
Hemoglobin: 11.8 g/dL — ABNORMAL LOW (ref 13.0–17.0)
LYMPHS ABS: 0.8 10*3/uL (ref 0.7–4.0)
Lymphocytes Relative: 28.9 % (ref 12.0–46.0)
MCHC: 35.2 g/dL (ref 30.0–36.0)
MCV: 96.8 fl (ref 78.0–100.0)
MONO ABS: 0.2 10*3/uL (ref 0.1–1.0)
MONOS PCT: 8.6 % (ref 3.0–12.0)
NEUTROS PCT: 54.7 % (ref 43.0–77.0)
Neutro Abs: 1.5 10*3/uL (ref 1.4–7.7)
RBC: 3.47 Mil/uL — AB (ref 4.22–5.81)
RDW: 16.2 % — ABNORMAL HIGH (ref 11.5–15.5)
WBC: 2.8 10*3/uL — ABNORMAL LOW (ref 4.0–10.5)

## 2017-10-15 LAB — COMPREHENSIVE METABOLIC PANEL
ALK PHOS: 95 U/L (ref 39–117)
ALT: 35 U/L (ref 0–53)
AST: 43 U/L — AB (ref 0–37)
Albumin: 2.7 g/dL — ABNORMAL LOW (ref 3.5–5.2)
BILIRUBIN TOTAL: 3.7 mg/dL — AB (ref 0.2–1.2)
BUN: 14 mg/dL (ref 6–23)
CO2: 31 mEq/L (ref 19–32)
CREATININE: 1.04 mg/dL (ref 0.40–1.50)
Calcium: 8.2 mg/dL — ABNORMAL LOW (ref 8.4–10.5)
Chloride: 102 mEq/L (ref 96–112)
GFR: 80.5 mL/min (ref >60.00)
GLUCOSE: 154 mg/dL — AB (ref 70–99)
Potassium: 4.2 mEq/L (ref 3.5–5.1)
Sodium: 135 mEq/L (ref 135–145)
TOTAL PROTEIN: 6.7 g/dL (ref 6.0–8.3)

## 2017-10-15 LAB — PROTIME-INR
INR: 1.7 ratio — ABNORMAL HIGH (ref 0.8–1.0)
PROTHROMBIN TIME: 20.1 s — AB (ref 9.6–13.1)

## 2017-10-16 ENCOUNTER — Ambulatory Visit: Payer: PRIVATE HEALTH INSURANCE | Admitting: Gastroenterology

## 2017-10-16 ENCOUNTER — Encounter (HOSPITAL_COMMUNITY): Admission: RE | Payer: Self-pay | Source: Ambulatory Visit

## 2017-10-16 ENCOUNTER — Other Ambulatory Visit: Payer: Self-pay

## 2017-10-16 ENCOUNTER — Encounter (HOSPITAL_COMMUNITY): Payer: PRIVATE HEALTH INSURANCE

## 2017-10-16 ENCOUNTER — Encounter: Payer: Self-pay | Admitting: Gastroenterology

## 2017-10-16 ENCOUNTER — Other Ambulatory Visit: Payer: PRIVATE HEALTH INSURANCE

## 2017-10-16 ENCOUNTER — Ambulatory Visit (HOSPITAL_COMMUNITY)
Admission: RE | Admit: 2017-10-16 | Payer: PRIVATE HEALTH INSURANCE | Source: Ambulatory Visit | Admitting: Gastroenterology

## 2017-10-16 VITALS — BP 100/60 | HR 74 | Ht 65.0 in | Wt 125.2 lb

## 2017-10-16 DIAGNOSIS — D696 Thrombocytopenia, unspecified: Secondary | ICD-10-CM

## 2017-10-16 DIAGNOSIS — K7460 Unspecified cirrhosis of liver: Secondary | ICD-10-CM

## 2017-10-16 DIAGNOSIS — R197 Diarrhea, unspecified: Secondary | ICD-10-CM | POA: Diagnosis not present

## 2017-10-16 SURGERY — COLONOSCOPY WITH PROPOFOL
Anesthesia: Monitor Anesthesia Care

## 2017-10-16 MED ORDER — PEG 3350-KCL-NA BICARB-NACL 420 G PO SOLR: 4000.0000 mL | ORAL | 0 refills | Status: DC

## 2017-10-16 NOTE — Patient Instructions (Addendum)
Colonoscopy and EGD next Thursday at [facility] with MAC sedation. 2 unit platelet transfusion next Wednesday Start imodium one pill every morning. You will have labs checked today in the basement lab.  Please head down after you check out with the front desk  (stool for gi pathogen panel). [place] for hepatoma screening  You have been scheduled for an abdominal ultrasound at [facility] (1st floor of hospital) on 10/22/17 at 1130am. Please arrive 15 minutes prior to your appointment for registration. Make certain not to have anything to eat or drink 6 hours prior to your appointment. Should you need to reschedule your appointment, please contact radiology at [phone]. This test typically takes about 30 minutes to perform.

## 2017-10-16 NOTE — Progress Notes (Signed)
Review of pertinent gastrointestinal problems: 1. 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 Lurlean Leyden   Thrombocytopenia (Plts around 20K), mild ascites, signs of portal HTN on imaging  Labs 2019: Hepatitis B E antibody reactive, hepatitis B core total antibody reactive, hepatitis B surface antigen reactive, hepatitis B E antigen nonreactive (2 years ago this was positive) hepatitis B surface antibody  Nonreactive, hepatitis B DNA 1,200,000 IUs/mL  Cirrhosis etiology workup: Labs 2016: Ferritin slightly elevated, HIV negative, hepatitis C virus IgG negative, anti-smooth muscle antibody IgG negative, ANA negative,  HCV Ab negative, ceruloplamin 12.3 (slightly low), A1A normal.  Current MELD: 10/2017 18  AFP 09/2017: 13.7  Liver imaging 08/2017 CT without IV contrast: cirrhosis, splenomegaly, mild ascites, signs of portal HTN  [facility] evaluation initiated 10/2017 Dr. Lurlean Leyden    HPI: This is a very pleasant 47 yo man, speaks [place]namese. Professional interpreter in the room.  Started low dose diuretics last month, vit K for several days without change in INR, evaluated at [facility] liver clinic Dr. Lurlean Leyden.  Down 10 pounds since his last visit here a month ago.  We had arranged for upper endoscopy and colonoscopy to be done today however a couple days ago he called and said he was too weak to undergo testing, he was having terrible diarrhea.  I canceled the procedures and arranged for office visit instead.  Today in the office he does not seem quite as acutely ill as I had thought.  He certainly has been having loose stools for 3 or 4 months now.  Whenever he eats just about anything tends to cause them and also in between meals as well.  Loose, nonbloody.  He threw up a small amount of red blood about a week ago and also had a nosebleed around that time as well.   Chief complaint is cirrhosis  ROS: complete GI ROS as described in HPI, all other  review negative.  Constitutional:  No unintentional weight loss  In addition to the medicine list below he is on tenofovir now    Past Medical History:  Diagnosis Date  . Cirrhosis ([address])   . Hepatitis B   . 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     Past Surgical History:  Procedure Laterality Date  . COMPLEX WOUND CLOSURE Right 12/15/2015   Procedure: COMPLEX WOUND CLOSURE;  Surgeon: Leanora Cover, MD;  Location: [place];  Service: Orthopedics;  Laterality: Right;  . I&D EXTREMITY Right 12/15/2015   Procedure: IRRIGATION AND DEBRIDEMENT AND REVISION AMPUTATION RIGHT RING FINGER;  Surgeon: Leanora Cover, MD;  Location: [place];  Service: Orthopedics;  Laterality: Right;    Current Outpatient Medications  Medication Sig Dispense Refill  . furosemide (LASIX) 20 MG tablet Take 1 tablet (20 mg total) by mouth daily. 30 tablet 11  . levocetirizine (XYZAL) 5 MG tablet Take 1 tablet (5 mg total) by mouth every evening. 30 tablet 0  . omeprazole (PRILOSEC) 20 MG capsule Take 1 capsule (20 mg total) by mouth daily. (Patient not taking: Reported on 10/10/2017) 30 capsule 0  . omeprazole (PRILOSEC) 20 MG capsule Take 1 capsule (20 mg total) by mouth daily. 90 capsule 3  . polyethylene glycol-electrolytes (NULYTELY/GOLYTELY) 420 g solution Take 4,000 mLs by mouth as directed. 4000 mL 0  . spironolactone (ALDACTONE) 50 MG tablet Take 1 tablet (50 mg total) by mouth daily. 30 tablet 11  . vitamin k 100 MCG tablet Take 1  tablet (100 mcg total) by mouth daily. (Patient not taking: Reported on 10/10/2017) 3 tablet 0   Current Facility-Administered Medications  Medication Dose Route Frequency Provider Last Rate Last Dose  . 0.9 %  sodium chloride infusion   Intravenous Once Milus Banister, MD      . 0.9 %  sodium chloride infusion   Intravenous Once Milus Banister, MD        Allergies as of 10/16/2017  . (No Known Allergies)    History reviewed. No pertinent family history.  Social  History   Socioeconomic History  . Marital status: Married    Spouse name: Not on file  . Number of children: 4  . Years of education: Not on file  . Highest education level: Not on file  Occupational History  . 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 Financial resource strain: Not on file  . Food insecurity:    Worry: Not on file    Inability: Not on file  . Transportation needs:    Medical: Not on file    Non-medical: Not on file  Tobacco Use  . Smoking status: Former Smoker    Packs/day: 0.50    Years: 8.00    Pack years: 4.00    Types: Cigarettes    Last attempt to quit: 04/08/1990    Years since quitting: 27.5  . Smokeless tobacco: Never Used  Substance and Sexual Activity  . Alcohol use: No  . Drug use: No  . Sexual activity: Yes  Lifestyle  . Physical activity:    Days per week: Not on file    Minutes per session: Not on file  . Stress: Not on file  Relationships  . Social connections:    Talks on phone: Not on file    Gets together: Not on file    Attends religious service: Not on file    Active member of club or organization: Not on file    Attends meetings of clubs or organizations: Not on file    Relationship status: Not on file  . Intimate partner violence:    Fear of current or ex partner: Not on file    Emotionally abused: Not on file    Physically abused: Not on file    Forced sexual activity: Not on file  Other Topics Concern  . Not on file  Social History Narrative  . Not on file     Physical Exam: BP 100/60   Pulse 74   Ht _0  (1.651 m)   Wt 125 lb 3.2 oz (56.8 kg)   BMI 20.83 kg/m  Constitutional: generally well-appearing Psychiatric: alert and oriented x3 Abdomen: soft, nontender, nondistended, no obvious ascites, no peritoneal signs, normal bowel sounds No peripheral edema noted in lower extremities  Assessment and plan: 47 y.o. 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  We will send stool for GI pathogen panel today.  He will start a single Imodium 1 pill every day to help empirically for now.  I am going to reschedule his colonoscopy for the diarrhea and also colon cancer screening and also upper endoscopy for next Thursday at [facility] with MAC sedation.  He needs hepatoma screening and I am ordering an ultrasound.  The day prior to his procedures he will get 2 unit platelet transfusion.  He had some minor hematemesis last week and this  was around the time of a nosebleed.  CBC yesterday showed a hemoglobin of 11.8.  He is clearly not actively bleeding.  I told him that if he has overt hematemesis again he needs to go to the emergency room.  With his portal hypertension, cirrhosis and critically low platelets he is certainly at risk for serious bleeding.    Greatly appreciate [facility] evaluation.  Please see the "Patient Instructions" section for addition details about the plan.  Owens Loffler, MD [facility] 10/16/2017, 3:29 PM

## 2017-10-16 NOTE — H&P (View-Only) (Signed)
Review of pertinent gastrointestinal problems: 1. 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 Zamore   Thrombocytopenia (Plts around 20K), mild ascites, signs of portal HTN on imaging  Labs 2019: Hepatitis B E antibody reactive, hepatitis B core total antibody reactive, hepatitis B surface antigen reactive, hepatitis B E antigen nonreactive (2 years ago this was positive) hepatitis B surface antibody  Nonreactive, hepatitis B DNA 1,200,000 IUs/mL  Cirrhosis etiology workup: Labs 2016: Ferritin slightly elevated, HIV negative, hepatitis C virus IgG negative, anti-smooth muscle antibody IgG negative, ANA negative,  HCV Ab negative, ceruloplamin 12.3 (slightly low), A1A normal.  Current MELD: 10/2017 18  AFP 09/2017: 13.7  Liver imaging 08/2017 CT without IV contrast: cirrhosis, splenomegaly, mild ascites, signs of portal HTN  [facility] evaluation initiated 10/2017 Dr. Zamore    HPI: This is a very pleasant 47 yo man, speaks vietnamese. Professional interpreter in the room.  Started low dose diuretics last month, vit K for several days without change in INR, evaluated at [facility] Dr. Zamore.  Down 10 pounds since his last visit here a month ago.  We had arranged for upper endoscopy and colonoscopy to be done today however a couple days ago he called and said he was too weak to undergo testing, he was having terrible diarrhea.  I canceled the procedures and arranged for office visit instead.  Today in the office he does not seem quite as acutely ill as I had thought.  He certainly has been having loose stools for 3 or 4 months now.  Whenever he eats just about anything tends to cause them and also in between meals as well.  Loose, nonbloody.  He threw up a small amount of red blood about a week ago and also had a nosebleed around that time as well.   Chief complaint is cirrhosis  ROS: complete GI ROS as described in HPI, all other  review negative.  Constitutional:  No unintentional weight loss  In addition to the medicine list below he is on tenofovir now    Past Medical History:  Diagnosis Date  . Cirrhosis (HCC)   . Hepatitis B   . 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     Past Surgical History:  Procedure Laterality Date  . COMPLEX WOUND CLOSURE Right 12/15/2015   Procedure: COMPLEX WOUND CLOSURE;  Surgeon: Kevin Kuzma, MD;  Location: [facility] OR;  Service: Orthopedics;  Laterality: Right;  . I&D EXTREMITY Right 12/15/2015   Procedure: IRRIGATION AND DEBRIDEMENT AND REVISION AMPUTATION RIGHT RING FINGER;  Surgeon: Kevin Kuzma, MD;  Location: [facility] OR;  Service: Orthopedics;  Laterality: Right;    Current Outpatient Medications  Medication Sig Dispense Refill  . furosemide (LASIX) 20 MG tablet Take 1 tablet (20 mg total) by mouth daily. 30 tablet 11  . levocetirizine (XYZAL) 5 MG tablet Take 1 tablet (5 mg total) by mouth every evening. 30 tablet 0  . omeprazole (PRILOSEC) 20 MG capsule Take 1 capsule (20 mg total) by mouth daily. (Patient not taking: Reported on 10/10/2017) 30 capsule 0  . omeprazole (PRILOSEC) 20 MG capsule Take 1 capsule (20 mg total) by mouth daily. 90 capsule 3  . polyethylene glycol-electrolytes (NULYTELY/GOLYTELY) 420 g solution Take 4,000 mLs by mouth as directed. 4000 mL 0  . spironolactone (ALDACTONE) 50 MG tablet Take 1 tablet (50 mg total) by mouth daily. 30 tablet 11  . vitamin k 100 MCG tablet Take 1   tablet (100 mcg total) by mouth daily. (Patient not taking: Reported on 10/10/2017) 3 tablet 0   Current Facility-Administered Medications  Medication Dose Route Frequency Provider Last Rate Last Dose  . 0.9 %  sodium chloride infusion   Intravenous Once Savas Elvin P, MD      . 0.9 %  sodium chloride infusion   Intravenous Once Mancil Pfenning P, MD        Allergies as of 10/16/2017  . (No Known Allergies)    History reviewed. No pertinent family history.  Social  History   Socioeconomic History  . Marital status: Married    Spouse name: Not on file  . Number of children: 4  . Years of education: Not on file  . Highest education level: Not on file  Occupational History  . Occupation: [employer]    Comment: Builds chair parts  Social Needs  . Financial resource strain: Not on file  . Food insecurity:    Worry: Not on file    Inability: Not on file  . Transportation needs:    Medical: Not on file    Non-medical: Not on file  Tobacco Use  . Smoking status: Former Smoker    Packs/day: 0.50    Years: 8.00    Pack years: 4.00    Types: Cigarettes    Last attempt to quit: 04/08/1990    Years since quitting: 27.5  . Smokeless tobacco: Never Used  Substance and Sexual Activity  . Alcohol use: No  . Drug use: No  . Sexual activity: Yes  Lifestyle  . Physical activity:    Days per week: Not on file    Minutes per session: Not on file  . Stress: Not on file  Relationships  . Social connections:    Talks on phone: Not on file    Gets together: Not on file    Attends religious service: Not on file    Active member of club or organization: Not on file    Attends meetings of clubs or organizations: Not on file    Relationship status: Not on file  . Intimate partner violence:    Fear of current or ex partner: Not on file    Emotionally abused: Not on file    Physically abused: Not on file    Forced sexual activity: Not on file  Other Topics Concern  . Not on file  Social History Narrative  . Not on file     Physical Exam: BP 100/60   Pulse 74   Ht 5' 5" (1.651 m)   Wt 125 lb 3.2 oz (56.8 kg)   BMI 20.83 kg/m  Constitutional: generally well-appearing Psychiatric: alert and oriented x3 Abdomen: soft, nontender, nondistended, no obvious ascites, no peritoneal signs, normal bowel sounds No peripheral edema noted in lower extremities  Assessment and plan: 47 y.o. 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  We will send stool for GI pathogen panel today.  He will start a single Imodium 1 pill every day to help empirically for now.  I am going to reschedule his colonoscopy for the diarrhea and also colon cancer screening and also upper endoscopy for next Thursday at [facility] with MAC sedation.  He needs hepatoma screening and I am ordering an ultrasound.  The day prior to his procedures he will get 2 unit platelet transfusion.  He had some minor hematemesis last week and this   was around the time of a nosebleed.  CBC yesterday showed a hemoglobin of 11.8.  He is clearly not actively bleeding.  I told him that if he has overt hematemesis again he needs to go to the emergency room.  With his portal hypertension, cirrhosis and critically low platelets he is certainly at risk for serious bleeding.    Greatly appreciate [facility] evaluation.  Please see the "Patient Instructions" section for addition details about the plan.  Cordarious Zeek, MD [facility] 10/16/2017, 3:29 PM    

## 2017-10-20 LAB — GASTROINTESTINAL PATHOGEN PANEL PCR
C. DIFFICILE TOX A/B, PCR: NOT DETECTED
CRYPTOSPORIDIUM, PCR: NOT DETECTED
Campylobacter, PCR: NOT DETECTED
E COLI (STEC) STX1/STX2, PCR: NOT DETECTED
E COLI 0157, PCR: NOT DETECTED
E coli (ETEC) LT/ST PCR: NOT DETECTED
Giardia lamblia, PCR: NOT DETECTED
Norovirus, PCR: NOT DETECTED
Rotavirus A, PCR: NOT DETECTED
SALMONELLA, PCR: NOT DETECTED
Shigella, PCR: NOT DETECTED

## 2017-10-21 ENCOUNTER — Encounter (HOSPITAL_COMMUNITY): Payer: Self-pay | Admitting: *Deleted

## 2017-10-21 NOTE — Progress Notes (Signed)
Spoke with patient briefly on phone regarding procedure with [name] on 10/23/2017. Pt then asked me to call his daughter Hinda Kehr. I left a message with daughter and asked her to call us back.  I left a message with Interpreting service [phone] regarding getting an interpreter on Thursday. Information left on machine for date and time and language needed.

## 2017-10-22 ENCOUNTER — Ambulatory Visit (HOSPITAL_COMMUNITY)
Admission: RE | Admit: 2017-10-22 | Discharge: 2017-10-22 | Disposition: A | Discharge status: Home / Self Care | Payer: PRIVATE HEALTH INSURANCE | Source: Ambulatory Visit | Attending: Gastroenterology | Admitting: Gastroenterology

## 2017-10-22 DIAGNOSIS — K7460 Unspecified cirrhosis of liver: Secondary | ICD-10-CM | POA: Insufficient documentation

## 2017-10-22 DIAGNOSIS — R161 Splenomegaly, not elsewhere classified: Secondary | ICD-10-CM | POA: Insufficient documentation

## 2017-10-22 DIAGNOSIS — D696 Thrombocytopenia, unspecified: Secondary | ICD-10-CM | POA: Diagnosis not present

## 2017-10-22 DIAGNOSIS — R197 Diarrhea, unspecified: Secondary | ICD-10-CM | POA: Diagnosis present

## 2017-10-22 LAB — ABO/RH: ABO/RH(D): B POS

## 2017-10-22 MED ORDER — SODIUM CHLORIDE 0.9% IV SOLUTION
Freq: Once | INTRAVENOUS | Status: AC
  Administered 2017-10-22: 10:00:00 via INTRAVENOUS

## 2017-10-22 NOTE — Progress Notes (Signed)
PATIENT CARE CENTER NOTE  Diagnosis: Thrombocytopenia    Provider: [name]   Procedure: 2 units of platelets    Note: Patient received 2 units of platelets. Patient tolerated transfusion well. Vitals stable. Discharge instructions given to patient and patient's translator. Patient alert, oriented and ambulatory at discharge.

## 2017-10-22 NOTE — Discharge Instructions (Signed)
Platelet Transfusion, Care After Refer to this sheet in the next few weeks. These instructions provide you with information about caring for yourself after your procedure. Your health care provider may also give you more specific instructions. Your treatment has been planned according to current medical practices, but problems sometimes occur. Call your health care provider if you have any problems or questions after your procedure. What can I expect after the procedure? After the procedure, it is common to have:  Bruising and soreness at the IV site.  Fever or chills within the first 48 hours of your transfusion.  Follow these instructions at home:  Take medicines only as directed by your health care provider. Ask your health care provider if you can take an over-the-counter pain reliever in case you have a fever or headache a day or two after your transfusion.  Return to your normal activities as directed by your health care provider. Contact a health care provider if:  You have a fever.  You have a headache.  You have redness, swelling, or pain at your IV site.  You have skin itching or a rash.  You vomit.  You feel unusually tired or weak. Get help right away if:  You have trouble breathing.  You have a decreased amount of urine or you urinate less often than you normally do.  Your urine is darker than normal.  You have pain in your back, abdomen, or chest.  You have cool, clammy skin.  You have a rapid heartbeat. This information is not intended to replace advice given to you by your health care provider. Make sure you discuss any questions you have with your health care provider. Document Released: 04/15/2014 Document Revised: 08/31/2015 Document Reviewed: 02/02/2014 Elsevier Interactive Patient Education  2018 [employer].

## 2017-10-23 ENCOUNTER — Encounter (HOSPITAL_COMMUNITY)
Admission: RE | Disposition: A | Discharge status: Home / Self Care | Payer: Self-pay | Source: Ambulatory Visit | Attending: Gastroenterology

## 2017-10-23 ENCOUNTER — Ambulatory Visit (HOSPITAL_COMMUNITY): Payer: PRIVATE HEALTH INSURANCE | Admitting: Certified Registered Nurse Anesthetist

## 2017-10-23 ENCOUNTER — Encounter (HOSPITAL_COMMUNITY): Payer: Self-pay | Admitting: Gastroenterology

## 2017-10-23 ENCOUNTER — Other Ambulatory Visit: Payer: Self-pay

## 2017-10-23 ENCOUNTER — Ambulatory Visit (HOSPITAL_COMMUNITY)
Admission: RE | Admit: 2017-10-23 | Discharge: 2017-10-23 | Disposition: A | Discharge status: Home / Self Care | Payer: PRIVATE HEALTH INSURANCE | Source: Ambulatory Visit | Attending: Gastroenterology | Admitting: Gastroenterology

## 2017-10-23 DIAGNOSIS — K52832 Lymphocytic colitis: Secondary | ICD-10-CM | POA: Diagnosis not present

## 2017-10-23 DIAGNOSIS — R197 Diarrhea, unspecified: Secondary | ICD-10-CM

## 2017-10-23 DIAGNOSIS — Z8611 Personal history of tuberculosis: Secondary | ICD-10-CM | POA: Diagnosis not present

## 2017-10-23 DIAGNOSIS — D696 Thrombocytopenia, unspecified: Secondary | ICD-10-CM | POA: Insufficient documentation

## 2017-10-23 DIAGNOSIS — K3189 Other diseases of stomach and duodenum: Secondary | ICD-10-CM | POA: Diagnosis not present

## 2017-10-23 DIAGNOSIS — K766 Portal hypertension: Secondary | ICD-10-CM | POA: Insufficient documentation

## 2017-10-23 DIAGNOSIS — Z79899 Other long term (current) drug therapy: Secondary | ICD-10-CM | POA: Insufficient documentation

## 2017-10-23 DIAGNOSIS — B181 Chronic viral hepatitis B without delta-agent: Secondary | ICD-10-CM | POA: Insufficient documentation

## 2017-10-23 DIAGNOSIS — Z13810 Encounter for screening for upper gastrointestinal disorder: Secondary | ICD-10-CM

## 2017-10-23 DIAGNOSIS — K7460 Unspecified cirrhosis of liver: Secondary | ICD-10-CM | POA: Insufficient documentation

## 2017-10-23 DIAGNOSIS — I8500 Esophageal varices without bleeding: Secondary | ICD-10-CM

## 2017-10-23 DIAGNOSIS — Z87891 Personal history of nicotine dependence: Secondary | ICD-10-CM | POA: Diagnosis not present

## 2017-10-23 HISTORY — PX: ESOPHAGOGASTRODUODENOSCOPY (EGD) WITH PROPOFOL: SHX5813

## 2017-10-23 HISTORY — PX: COLONOSCOPY WITH PROPOFOL: SHX5780

## 2017-10-23 HISTORY — PX: BIOPSY: SHX5522

## 2017-10-23 LAB — PREPARE PLATELET PHERESIS
UNIT DIVISION: 0
Unit division: 0

## 2017-10-23 LAB — BPAM PLATELET PHERESIS
BLOOD PRODUCT EXPIRATION DATE: 201907182359
Blood Product Expiration Date: 201907182359
ISSUE DATE / TIME: 201907170912
ISSUE DATE / TIME: 201907171011
Unit Type and Rh: 6200
Unit Type and Rh: 6200

## 2017-10-23 SURGERY — COLONOSCOPY WITH PROPOFOL
Anesthesia: Monitor Anesthesia Care

## 2017-10-23 MED ORDER — LACTATED RINGERS IV SOLN
INTRAVENOUS | Status: DC
  Administered 2017-10-23: 09:00:00 via INTRAVENOUS

## 2017-10-23 MED ORDER — NADOLOL 20 MG PO TABS: 20.0000 mg | ORAL_TABLET | Freq: Every day | ORAL | 3 refills | Status: DC

## 2017-10-23 MED ORDER — SODIUM CHLORIDE 0.9 % IV SOLN: INTRAVENOUS | Status: DC

## 2017-10-23 MED ORDER — PROPOFOL 10 MG/ML IV BOLUS
INTRAVENOUS | Status: AC
  Filled 2017-10-23: qty 60

## 2017-10-23 MED ORDER — ONDANSETRON HCL 4 MG/2ML IJ SOLN
INTRAMUSCULAR | Status: DC | PRN
  Administered 2017-10-23: 4 mg via INTRAVENOUS

## 2017-10-23 MED ORDER — PROPOFOL 10 MG/ML IV BOLUS
INTRAVENOUS | Status: DC | PRN
  Administered 2017-10-23 (×4): 10 mg via INTRAVENOUS

## 2017-10-23 MED ORDER — LIDOCAINE HCL (CARDIAC) PF 100 MG/5ML IV SOSY
PREFILLED_SYRINGE | INTRAVENOUS | Status: DC | PRN
  Administered 2017-10-23: 100 mg via INTRAVENOUS

## 2017-10-23 MED ORDER — PROPOFOL 500 MG/50ML IV EMUL
INTRAVENOUS | Status: DC | PRN
  Administered 2017-10-23: 125 ug/kg/min via INTRAVENOUS

## 2017-10-23 MED ORDER — PROPOFOL 10 MG/ML IV BOLUS
INTRAVENOUS | Status: AC
  Filled 2017-10-23: qty 20

## 2017-10-23 SURGICAL SUPPLY — 25 items

## 2017-10-23 NOTE — Interval H&P Note (Signed)
History and Physical Interval Note:  10/23/2017 9:01 AM  Jose Snow  has presented today for surgery, with the diagnosis of diarrhea  The various methods of treatment have been discussed with the patient and family. After consideration of risks, benefits and other options for treatment, the patient has consented to  Procedure(s): COLONOSCOPY WITH PROPOFOL (N/A) ESOPHAGOGASTRODUODENOSCOPY (EGD) WITH PROPOFOL (N/A) as a surgical intervention .  The patient's history has been reviewed, patient examined, no change in status, stable for surgery.  I have reviewed the patient's chart and labs.  Questions were answered to the patient's satisfaction.     Milus Banister

## 2017-10-23 NOTE — Transfer of Care (Signed)
Immediate Anesthesia Transfer of Care Note  Patient: Jose Snow  Procedure(s) Performed: COLONOSCOPY WITH PROPOFOL (N/A ) ESOPHAGOGASTRODUODENOSCOPY (EGD) WITH PROPOFOL (N/A ) BIOPSY  Patient Location: PACU and Endoscopy Unit  Anesthesia Type:MAC  Level of Consciousness: awake and drowsy  Airway & Oxygen Therapy: Patient Spontanous Breathing and Patient connected to nasal cannula oxygen  Post-op Assessment: Report given to RN and Post -op Vital signs reviewed and stable  Post vital signs: Reviewed and stable  Last Vitals:  Vitals Value Taken Time  BP    Temp    Pulse    Resp    SpO2      Last Pain:  Vitals:   10/23/17 0916  TempSrc: Oral  PainSc: 0-No pain         Complications: No apparent anesthesia complications

## 2017-10-23 NOTE — Anesthesia Preprocedure Evaluation (Signed)
Anesthesia Evaluation  Patient identified by MRN, date of birth, ID band Patient awake    Reviewed: Allergy & Precautions, NPO status , Patient's Chart, lab work & pertinent test results  Airway Mallampati: II   Neck ROM: full    Dental   Pulmonary former smoker,  H/o TB s/p treatment   breath sounds clear to auscultation       Cardiovascular negative cardio ROS   Rhythm:regular Rate:Normal     Neuro/Psych    GI/Hepatic (+) Cirrhosis       , Hepatitis -, B  Endo/Other    Renal/GU      Musculoskeletal   Abdominal   Peds  Hematology thrombocytopenia   Anesthesia Other Findings   Reproductive/Obstetrics                             Anesthesia Physical Anesthesia Plan  ASA: III  Anesthesia Plan: MAC   Post-op Pain Management:    Induction: Intravenous  PONV Risk Score and Plan: 1 and Ondansetron, Propofol infusion and Treatment may vary due to age or medical condition  Airway Management Planned: Nasal Cannula  Additional Equipment:   Intra-op Plan:   Post-operative Plan:   Informed Consent: I have reviewed the patients History and Physical, chart, labs and discussed the procedure including the risks, benefits and alternatives for the proposed anesthesia with the patient or authorized representative who has indicated his/her understanding and acceptance.     Plan Discussed with: CRNA, Anesthesiologist and Surgeon  Anesthesia Plan Comments:         Anesthesia Quick Evaluation

## 2017-10-23 NOTE — Discharge Instructions (Signed)
YOU HAD AN ENDOSCOPIC PROCEDURE TODAY: Refer to the procedure report and other information in the discharge instructions given to you for any specific questions about what was found during the examination. If this information does not answer your questions, please call [place] office at [phone] to clarify.  ° °YOU SHOULD EXPECT: Some feelings of bloating in the abdomen. Passage of more gas than usual. Walking can help get rid of the air that was put into your GI tract during the procedure and reduce the bloating. If you had a lower endoscopy (such as a colonoscopy or flexible sigmoidoscopy) you may notice spotting of blood in your stool or on the toilet paper. Some abdominal soreness may be present for a day or two, also. ° °DIET: Your first meal following the procedure should be a light meal and then it is ok to progress to your normal diet. A half-sandwich or bowl of soup is an example of a good first meal. Heavy or fried foods are harder to digest and may make you feel nauseous or bloated. Drink plenty of fluids but you should avoid alcoholic beverages for 24 hours. If you had a esophageal dilation, please see attached instructions for diet.   ° °ACTIVITY: Your care partner should take you home directly after the procedure. You should plan to take it easy, moving slowly for the rest of the day. You can resume normal activity the day after the procedure however YOU SHOULD NOT DRIVE, use power tools, machinery or perform tasks that involve climbing or major physical exertion for 24 hours (because of the sedation medicines used during the test).  ° °SYMPTOMS TO REPORT IMMEDIATELY: °A gastroenterologist can be reached at any hour. Please call [phone]  for any of the following symptoms:  °Following lower endoscopy (colonoscopy, flexible sigmoidoscopy) °Excessive amounts of blood in the stool  °Significant tenderness, worsening of abdominal pains  °Swelling of the abdomen that is new, acute  °Fever of 100° or  higher  °Following upper endoscopy (EGD, EUS, ERCP, esophageal dilation) °Vomiting of blood or coffee ground material  °New, significant abdominal pain  °New, significant chest pain or pain under the shoulder blades  °Painful or persistently difficult swallowing  °New shortness of breath  °Black, tarry-looking or red, bloody stools ° °FOLLOW UP:  °If any biopsies were taken you will be contacted by phone or by letter within the next 1-3 weeks. Call [phone]  if you have not heard about the biopsies in 3 weeks.  °Please also call with any specific questions about appointments or follow up tests. ° °

## 2017-10-23 NOTE — Op Note (Signed)
[facility] Patient Name: Jose Snow Procedure Date: 10/23/2017 MRN: [id number] Attending MD: Milus Banister , MD Date of Birth: [birth year] CSN: [id number] Age: 47 Admit Type: Outpatient Procedure:                Upper GI endoscopy Indications:              Screening procedure, known cirrhosis, screening for                            varices Providers:                Milus Banister, MD, Zenon Mayo, RN, William Dalton, Technician Referring MD:              Medicines:                Monitored Anesthesia Care Complications:            No immediate complications. Estimated blood loss:                            None. Estimated Blood Loss:     Estimated blood loss: none. Procedure:                Pre-Anesthesia Assessment:                           - Prior to the procedure, a History and Physical                            was performed, and patient medications and                            allergies were reviewed. The patient's tolerance of                            previous anesthesia was also reviewed. The risks                            and benefits of the procedure and the sedation                            options and risks were discussed with the patient.                            All questions were answered, and informed consent                            was obtained. Prior Anticoagulants: The patient has                            taken no previous anticoagulant or antiplatelet                            agents. ASA Grade Assessment: IV -  A patient with                            severe systemic disease that is a constant threat                            to life. After reviewing the risks and benefits,                            the patient was deemed in satisfactory condition to                            undergo the procedure.                           After obtaining informed consent, the endoscope was          passed under direct vision. Throughout the                            procedure, the patient's blood pressure, pulse, and                            oxygen saturations were monitored continuously. The                            EG-[id number] ([id number]) scope was introduced through the                            mouth, and advanced to the second part of duodenum.                            The upper GI endoscopy was accomplished without                            difficulty. The patient tolerated the procedure                            well. Scope In: Scope Out: Findings:      There were small to medium sized distal esophagus varices without signs       of recent bleeding, one trunk.      There was typical appearing mild portal gastropathy changes throughout       the stomach. No gastric varices.      The examined duodenum was normal. 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 Moderate Sedation:      N/A- Per Anesthesia Care Recommendation:           - Patient has a contact number available for                            emergencies. The signs and symptoms  of potential                            delayed complications were discussed with the                            patient. Return to normal activities tomorrow.                            Written discharge instructions were provided to the                            patient.                           - Resume previous diet. Low salt.                           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                           - Office visit already scheduled (August 30th with                            Dr. JArdis Hughs. Procedure Code(s):        --- Professional ---                           [id number]  Esophagogastroduodenoscopy, flexible,                            transoral; diagnostic, including collection of                            specimen(s) by brushing or washing, when performed                            (separate procedure) Diagnosis Code(s):        --- Professional ---                           Z13.810, Encounter for screening for upper                            gastrointestinal disorder CPT copyright 2017 American [employer]. All rights reserved. The codes documented in this report are preliminary and upon coder review may  be revised to meet current compliance requirements. DMilus Banister MD 10/23/2017 11:29:57 AM This report has been signed electronically. Number of Addenda: 0

## 2017-10-23 NOTE — Op Note (Signed)
[facility] Patient Name: Jose Snow Procedure Date: 10/23/2017 MRN: [id number] Attending MD: Milus Banister , MD Date of Birth: [birth year] CSN: [id number] Age: 47 Admit Type: Outpatient Procedure:                Colonoscopy Indications:              Chronic diarrhea Providers:                Milus Banister, MD, Zenon Mayo, RN, William Dalton, Technician Referring MD:              Medicines:                Monitored Anesthesia Care Complications:            No immediate complications. Estimated blood loss:                            None. Estimated Blood Loss:     Estimated blood loss: none. Procedure:                Pre-Anesthesia Assessment:                           - Prior to the procedure, a History and Physical                            was performed, and patient medications and                            allergies were reviewed. The patient's tolerance of                            previous anesthesia was also reviewed. The risks                            and benefits of the procedure and the sedation                            options and risks were discussed with the patient.                            All questions were answered, and informed consent                            was obtained. Prior Anticoagulants: The patient has                            taken no previous anticoagulant or antiplatelet                            agents. ASA Grade Assessment: IV - A patient with                            severe systemic disease that  is a constant threat                            to life. After reviewing the risks and benefits,                            the patient was deemed in satisfactory condition to                            undergo the procedure.                           After obtaining informed consent, the colonoscope                            was passed under direct vision. Throughout the     procedure, the patient's blood pressure, pulse, and                            oxygen saturations were monitored continuously. The                            EC-[id number] ([id number]) scope was introduced through                            the anus and advanced to the the terminal ileum.                            The colonoscopy was performed without difficulty.                            The patient tolerated the procedure well. The                            quality of the bowel preparation was good. The                            ileocecal valve, appendiceal orifice, and rectum                            were photographed. Scope In: 10:54:35 AM Scope Out: 11:11:17 AM Scope Withdrawal Time: 0 hours 4 minutes 55 seconds  Total Procedure Duration: 0 hours 16 minutes 42 seconds  Findings:      Internal hemorrhoids were found. The hemorrhoids were medium-sized.      The exam was otherwise without abnormality on direct and retroflexion       views.      Biopsies for histology were taken with a cold forceps from the entire       colon for evaluation of microscopic colitis. Impression:               - Internal hemorrhoids.                           - The examination was otherwise normal on direct  and retroflexion views.                           - Biopsies were taken with a cold forceps from the                            entire colon for evaluation of microscopic colitis. Moderate Sedation:      N/A- Per Anesthesia Care Recommendation:           - Patient has a contact number available for                            emergencies. The signs and symptoms of potential                            delayed complications were discussed with the                            patient. Return to normal activities tomorrow.                            Written discharge instructions were provided to the                            patient.                           - Resume previous  diet.                           - Continue present medications. continue daily                            scheduled imodium for now.                           - Repeat colonoscopy in 10 years for screening.                           - Await final path. Procedure Code(s):        --- Professional ---                           [id number], Colonoscopy, flexible; with biopsy, single                            or multiple Diagnosis Code(s):        --- Professional ---                           K64.8, Other hemorrhoids                           K52.9, Noninfective gastroenteritis and colitis,                            unspecified CPT copyright 2017 American  Medical Association. All rights reserved. The codes documented in this report are preliminary and upon coder review may  be revised to meet current compliance requirements. Milus Banister, MD 10/23/2017 11:24:37 AM This report has been signed electronically. Number of Addenda: 0

## 2017-10-24 ENCOUNTER — Encounter (HOSPITAL_COMMUNITY): Payer: Self-pay | Admitting: Gastroenterology

## 2017-10-24 NOTE — Anesthesia Postprocedure Evaluation (Signed)
Anesthesia Post Note  Patient: Jose Snow  Procedure(s) Performed: COLONOSCOPY WITH PROPOFOL (N/A ) ESOPHAGOGASTRODUODENOSCOPY (EGD) WITH PROPOFOL (N/A ) BIOPSY     Patient location during evaluation: Endoscopy Anesthesia Type: MAC Level of consciousness: awake and alert Pain management: pain level controlled Vital Signs Assessment: post-procedure vital signs reviewed and stable Respiratory status: spontaneous breathing, nonlabored ventilation, respiratory function stable and patient connected to nasal cannula oxygen Cardiovascular status: stable and blood pressure returned to baseline Postop Assessment: no apparent nausea or vomiting Anesthetic complications: no    Last Vitals:  Vitals:   10/23/17 1140 10/23/17 1150  BP: (!) 100/46 (!) 104/45  Pulse: 78 68  Resp: 15 13  Temp: (!) 36.3 C   SpO2: 100% 98%    Last Pain:  Vitals:   10/23/17 1150  TempSrc:   PainSc: 0-No pain                 [name]

## 2017-10-30 ENCOUNTER — Telehealth: Payer: Self-pay

## 2017-10-30 ENCOUNTER — Encounter: Payer: Self-pay | Admitting: Gastroenterology

## 2017-10-30 ENCOUNTER — Other Ambulatory Visit: Payer: Self-pay

## 2017-10-30 MED ORDER — BUDESONIDE 3 MG PO CPEP: 9.0000 mg | ORAL_CAPSULE | Freq: Every day | ORAL | 3 refills | Status: DC

## 2017-10-30 NOTE — Telephone Encounter (Signed)
Budesonide approved from 10/30/17-12/25/2017 per [employer]. We received this information in a fax. I left voicemail for the pharmacy that this was approved.

## 2017-10-30 NOTE — Telephone Encounter (Signed)
Submitted a prior authorization over the phone verbally with [facility] management phone # [phone]. Spoke with Katrina. 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

## 2017-11-03 ENCOUNTER — Telehealth: Payer: Self-pay | Admitting: Gastroenterology

## 2017-11-03 DIAGNOSIS — R609 Edema, unspecified: Secondary | ICD-10-CM

## 2017-11-03 DIAGNOSIS — K7460 Unspecified cirrhosis of liver: Secondary | ICD-10-CM

## 2017-11-03 NOTE — Telephone Encounter (Signed)
Left message on machine to call back  

## 2017-11-03 NOTE — Telephone Encounter (Signed)
Patient friend Jose Snow states pt is coughing a lot and his legs are swelling. Jose Snow says that pt has discussed these issues with Dr.Jacobs and wants to know what to do now.

## 2017-11-04 NOTE — Telephone Encounter (Signed)
Left message on machine to call back  

## 2017-11-04 NOTE — Telephone Encounter (Signed)
The pt's friend and interpreter states that the pt has had an increase in coughing and leg swelling.  She is not sure if he has gained any weight.  She will try to get a current weight and call back.  He is taking lasix 20 mg daily, aldactone 50 mg daily and budesonide 9 mg daily as well as nadolol 20 mg daily.  Please advise

## 2017-11-05 ENCOUNTER — Other Ambulatory Visit (INDEPENDENT_AMBULATORY_CARE_PROVIDER_SITE_OTHER): Payer: PRIVATE HEALTH INSURANCE

## 2017-11-05 DIAGNOSIS — K7460 Unspecified cirrhosis of liver: Secondary | ICD-10-CM | POA: Diagnosis not present

## 2017-11-05 DIAGNOSIS — R609 Edema, unspecified: Secondary | ICD-10-CM

## 2017-11-05 LAB — BASIC METABOLIC PANEL
BUN: 14 mg/dL (ref 6–23)
CHLORIDE: 103 meq/L (ref 96–112)
CO2: 32 mEq/L (ref 19–32)
Calcium: 8.1 mg/dL — ABNORMAL LOW (ref 8.4–10.5)
Creatinine, Ser: 1.08 mg/dL (ref 0.40–1.50)
GFR: 77.05 mL/min (ref >60.00)
Glucose, Bld: 202 mg/dL — ABNORMAL HIGH (ref 70–99)
POTASSIUM: 4 meq/L (ref 3.5–5.1)
Sodium: 137 mEq/L (ref 135–145)

## 2017-11-05 NOTE — Telephone Encounter (Signed)
He needs labs (bmet) today or tomorrow.  Will adjust diuretics upward pending those results.  Advise they start taking daily weights at home starting today.

## 2017-11-05 NOTE — Telephone Encounter (Signed)
The pt weight is up to 147 lbs today, the advocate will keep a daily weight log and will have the pt come in for labs today.

## 2017-11-05 NOTE — Telephone Encounter (Signed)
Lab order is in Epic, message left for the advocate to return call.

## 2017-11-06 ENCOUNTER — Other Ambulatory Visit: Payer: Self-pay

## 2017-11-06 DIAGNOSIS — R609 Edema, unspecified: Secondary | ICD-10-CM

## 2017-11-06 DIAGNOSIS — K7460 Unspecified cirrhosis of liver: Secondary | ICD-10-CM

## 2017-11-06 MED ORDER — FUROSEMIDE 40 MG PO TABS: 40.0000 mg | ORAL_TABLET | Freq: Every day | ORAL | 3 refills | Status: DC

## 2017-11-06 MED ORDER — SPIRONOLACTONE 100 MG PO TABS: 100.0000 mg | ORAL_TABLET | Freq: Every day | ORAL | 3 refills | Status: DC

## 2017-11-19 ENCOUNTER — Other Ambulatory Visit (INDEPENDENT_AMBULATORY_CARE_PROVIDER_SITE_OTHER): Payer: PRIVATE HEALTH INSURANCE

## 2017-11-19 DIAGNOSIS — K7460 Unspecified cirrhosis of liver: Secondary | ICD-10-CM | POA: Diagnosis not present

## 2017-11-19 DIAGNOSIS — R609 Edema, unspecified: Secondary | ICD-10-CM | POA: Diagnosis not present

## 2017-11-19 LAB — BASIC METABOLIC PANEL
BUN: 22 mg/dL (ref 6–23)
CALCIUM: 8.4 mg/dL (ref 8.4–10.5)
CO2: 29 meq/L (ref 19–32)
CREATININE: 1.26 mg/dL (ref 0.40–1.50)
Chloride: 95 mEq/L — ABNORMAL LOW (ref 96–112)
GFR: 64.49 mL/min (ref >60.00)
GLUCOSE: 523 mg/dL — AB (ref 70–99)
Potassium: 4.4 mEq/L (ref 3.5–5.1)
Sodium: 126 mEq/L — ABNORMAL LOW (ref 135–145)

## 2017-11-19 NOTE — Telephone Encounter (Signed)
-----   Message from Marletta Lor, MD sent at 11/19/2017  6:12 PM EDT ----- Regarding: New onset uncontrolled diabetes Received call from Answering service about abnormal lab with BS>[id number].  I did contact patient or family by phone but significant language barrier and I do not think that they understood my message and need for office followup on 11/20/17. Please followup and try to get  patient in for office visit on 11/20/17. Thanks, PK

## 2017-11-26 ENCOUNTER — Other Ambulatory Visit (INDEPENDENT_AMBULATORY_CARE_PROVIDER_SITE_OTHER): Payer: PRIVATE HEALTH INSURANCE

## 2017-11-26 ENCOUNTER — Other Ambulatory Visit: Payer: Self-pay

## 2017-11-26 DIAGNOSIS — K7460 Unspecified cirrhosis of liver: Secondary | ICD-10-CM

## 2017-11-26 DIAGNOSIS — R609 Edema, unspecified: Secondary | ICD-10-CM | POA: Diagnosis not present

## 2017-11-26 LAB — BASIC METABOLIC PANEL
BUN: 23 mg/dL (ref 6–23)
CO2: 29 meq/L (ref 19–32)
Calcium: 8.4 mg/dL (ref 8.4–10.5)
Chloride: 98 mEq/L (ref 96–112)
Creatinine, Ser: 1.1 mg/dL (ref 0.40–1.50)
GFR: 75.42 mL/min (ref >60.00)
GLUCOSE: 260 mg/dL — AB (ref 70–99)
POTASSIUM: 3.8 meq/L (ref 3.5–5.1)
Sodium: 132 mEq/L — ABNORMAL LOW (ref 135–145)

## 2017-11-29 ENCOUNTER — Ambulatory Visit: Payer: PRIVATE HEALTH INSURANCE | Admitting: Physician Assistant

## 2017-12-03 ENCOUNTER — Encounter: Payer: Self-pay | Admitting: Emergency Medicine

## 2017-12-03 ENCOUNTER — Ambulatory Visit: Payer: PRIVATE HEALTH INSURANCE | Admitting: Emergency Medicine

## 2017-12-03 VITALS — BP 89/44 | HR 69 | Temp 98.2°F | Resp 17 | Ht 62.5 in | Wt 128.0 lb

## 2017-12-03 DIAGNOSIS — R931 Abnormal findings on diagnostic imaging of heart and coronary circulation: Secondary | ICD-10-CM

## 2017-12-03 DIAGNOSIS — R739 Hyperglycemia, unspecified: Secondary | ICD-10-CM | POA: Diagnosis not present

## 2017-12-03 DIAGNOSIS — K7581 Nonalcoholic steatohepatitis (NASH): Secondary | ICD-10-CM

## 2017-12-03 DIAGNOSIS — K7460 Unspecified cirrhosis of liver: Secondary | ICD-10-CM

## 2017-12-03 DIAGNOSIS — E119 Type 2 diabetes mellitus without complications: Secondary | ICD-10-CM | POA: Insufficient documentation

## 2017-12-03 DIAGNOSIS — K7469 Other cirrhosis of liver: Secondary | ICD-10-CM | POA: Diagnosis not present

## 2017-12-03 DIAGNOSIS — Z8619 Personal history of other infectious and parasitic diseases: Secondary | ICD-10-CM | POA: Insufficient documentation

## 2017-12-03 DIAGNOSIS — D61818 Other pancytopenia: Secondary | ICD-10-CM | POA: Diagnosis not present

## 2017-12-03 DIAGNOSIS — R252 Cramp and spasm: Secondary | ICD-10-CM | POA: Insufficient documentation

## 2017-12-03 DIAGNOSIS — E1169 Type 2 diabetes mellitus with other specified complication: Secondary | ICD-10-CM | POA: Insufficient documentation

## 2017-12-03 LAB — GLUCOSE, POCT (MANUAL RESULT ENTRY)

## 2017-12-03 MED ORDER — ALOGLIPTIN BENZOATE 12.5 MG PO TABS: 12.5000 mg | ORAL_TABLET | Freq: Every day | ORAL | 3 refills | Status: DC

## 2017-12-03 MED ORDER — METFORMIN HCL 500 MG PO TABS: 500.0000 mg | ORAL_TABLET | Freq: Two times a day (BID) | ORAL | 3 refills | Status: DC

## 2017-12-03 NOTE — Patient Instructions (Addendum)
If you have lab work done today you will be contacted with your lab results within the next 2 weeks.  If you have not heard from [place] then please contact us. The fastest way to get your results is to register for My Chart.   IF you received an x-ray today, you will receive an invoice from [facility]. Please contact [facility] at [phone] with questions or concerns regarding your invoice.   IF you received labwork today, you will receive an invoice from [place]. Please contact [facility] at [phone] with questions or concerns regarding your invoice.   Our billing staff will not be able to assist you with questions regarding bills from these companies.  You will be contacted with the lab results as soon as they are available. The fastest way to get your results is to activate your My Chart account. Instructions are located on the last page of this paperwork. If you have not heard from [place] regarding the results in 2 weeks, please contact this office.      Diabetes Mellitus and Nutrition When you have diabetes (diabetes mellitus), it is very important to have healthy eating habits because your blood sugar (glucose) levels are greatly affected by what you eat and drink. Eating healthy foods in the appropriate amounts, at about the same times every day, can help you:  Control your blood glucose.  Lower your risk of heart disease.  Improve your blood pressure.  Reach or maintain a healthy weight.  Every person with diabetes is different, and each person has different needs for a meal plan. Your health care provider may recommend that you work with a diet and nutrition specialist (dietitian) to make a meal plan that is best for you. Your meal plan may vary depending on factors such as:  The calories you need.  The medicines you take.  Your weight.  Your blood glucose, blood pressure, and cholesterol levels.  Your activity level.  Other health conditions  you have, such as heart or kidney disease.  How do carbohydrates affect me? Carbohydrates affect your blood glucose level more than any other type of food. Eating carbohydrates naturally increases the amount of glucose in your blood. Carbohydrate counting is a method for keeping track of how many carbohydrates you eat. Counting carbohydrates is important to keep your blood glucose at a healthy level, especially if you use insulin or take certain oral diabetes medicines. It is important to know how many carbohydrates you can safely have in each meal. This is different for every person. Your dietitian can help you calculate how many carbohydrates you should have at each meal and for snack. Foods that contain carbohydrates include:  Bread, cereal, rice, pasta, and crackers.  Potatoes and corn.  Peas, beans, and lentils.  Milk and yogurt.  Fruit and juice.  Desserts, such as cakes, cookies, ice cream, and candy.  How does alcohol affect me? Alcohol can cause a sudden decrease in blood glucose (hypoglycemia), especially if you use insulin or take certain oral diabetes medicines. Hypoglycemia can be a life-threatening condition. Symptoms of hypoglycemia (sleepiness, dizziness, and confusion) are similar to symptoms of having too much alcohol. If your health care provider says that alcohol is safe for you, follow these guidelines:  Limit alcohol intake to no more than 1 drink per day for nonpregnant women and 2 drinks per day for men. One drink equals 12 oz of beer, 5 oz of wine, or 1 oz of hard liquor.  Do not drink on an empty stomach.  Keep yourself hydrated with water, diet soda, or unsweetened iced tea.  Keep in mind that regular soda, juice, and other mixers may contain a lot of sugar and must be counted as carbohydrates.  What are tips for following this plan? Reading food labels  Start by checking the serving size on the label. The amount of calories, carbohydrates, fats, and  other nutrients listed on the label are based on one serving of the food. Many foods contain more than one serving per package.  Check the total grams (g) of carbohydrates in one serving. You can calculate the number of servings of carbohydrates in one serving by dividing the total carbohydrates by 15. For example, if a food has 30 g of total carbohydrates, it would be equal to 2 servings of carbohydrates.  Check the number of grams (g) of saturated and trans fats in one serving. Choose foods that have low or no amount of these fats.  Check the number of milligrams (mg) of sodium in one serving. Most people should limit total sodium intake to less than 2,300 mg per day.  Always check the nutrition information of foods labeled as "low-fat" or "nonfat". These foods may be higher in added sugar or refined carbohydrates and should be avoided.  Talk to your dietitian to identify your daily goals for nutrients listed on the label. Shopping  Avoid buying canned, premade, or processed foods. These foods tend to be high in fat, sodium, and added sugar.  Shop around the outside edge of the grocery store. This includes fresh fruits and vegetables, bulk grains, fresh meats, and fresh dairy. Cooking  Use low-heat cooking methods, such as baking, instead of high-heat cooking methods like deep frying.  Cook using healthy oils, such as olive, canola, or sunflower oil.  Avoid cooking with butter, cream, or high-fat meats. Meal planning  Eat meals and snacks regularly, preferably at the same times every day. Avoid going long periods of time without eating.  Eat foods high in fiber, such as fresh fruits, vegetables, beans, and whole grains. Talk to your dietitian about how many servings of carbohydrates you can eat at each meal.  Eat 4-6 ounces of lean protein each day, such as lean meat, chicken, fish, eggs, or tofu. 1 ounce is equal to 1 ounce of meat, chicken, or fish, 1 egg, or 1/4 cup of tofu.  Eat  some foods each day that contain healthy fats, such as avocado, nuts, seeds, and fish. Lifestyle   Check your blood glucose regularly.  Exercise at least 30 minutes 5 or more days each week, or as told by your health care provider.  Take medicines as told by your health care provider.  Do not use any products that contain nicotine or tobacco, such as cigarettes and e-cigarettes. If you need help quitting, ask your health care provider.  Work with a Social worker or diabetes educator to identify strategies to manage stress and any emotional and social challenges. What are some questions to ask my health care provider?  Do I need to meet with a diabetes educator?  Do I need to meet with a dietitian?  What number can I call if I have questions?  When are the best times to check my blood glucose? Where to find more information:  American Diabetes Association: diabetes.org/food-and-fitness/food  Academy of Nutrition and Dietetics: PokerClues.dk  [employer] of Diabetes and Digestive and Kidney Diseases (NIH): ContactWire.be Summary  A healthy meal plan  will help you control your blood glucose and maintain a healthy lifestyle.  Working with a diet and nutrition specialist (dietitian) can help you make a meal plan that is best for you.  Keep in mind that carbohydrates and alcohol have immediate effects on your blood glucose levels. It is important to count carbohydrates and to use alcohol carefully. This information is not intended to replace advice given to you by your health care provider. Make sure you discuss any questions you have with your health care provider. Document Released: 12/20/2004 Document Revised: 04/29/2016 Document Reviewed: 04/29/2016 Elsevier Interactive Patient Education  [employer].

## 2017-12-03 NOTE — Progress Notes (Signed)
Jose Snow 47 y.o.   Chief Complaint  Patient presents with  . Follow-up    diabetes    HISTORY OF PRESENT ILLNESS: This is a 47 y.o. 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  Here to evaluate possible new onset diabetes. Also complaining of chronic history of cramps in both upper and lower extremities for several years. Echocardiogram done on 09/25/2017 read as follows: Study Conclusions  - Left ventricle: The cavity size was normal. Wall thickness was   normal. Systolic function was vigorous. The estimated ejection   fraction was in the range of 65% to 70%. Wall motion was normal;   there were no regional wall motion abnormalities. Doppler   parameters are consistent with abnormal left ventricular   relaxation (grade 1 diastolic dysfunction). - Atrial septum: Positive bubble study. Very small volume late   bubble cross over, possible small PFO vs. intrapulmonary shunt. - Tricuspid valve: There was mild regurgitation. - Pulmonary arteries: Systolic pressure was mildly increased. PA   peak pressure: 43 mm Hg (S).  Lab Results  Component Value Date   HGBA1C 4.6 (L) 07/18/2017    HPI   Prior to Admission medications   Medication Sig Start Date End Date Taking? Authorizing Provider  budesonide (ENTOCORT EC) 3 MG 24 hr capsule Take 3 capsules (9 mg total) by mouth daily. 10/30/17   Milus Banister, MD  furosemide (LASIX) 40 MG tablet Take 1 tablet (40 mg total) by mouth daily. 11/06/17 02/04/18  Milus Banister, MD  levocetirizine (XYZAL) 5 MG tablet Take 1 tablet (5 mg total) by mouth every evening. 07/11/17   Tereasa Coop, PA-C  nadolol (CORGARD) 20 MG tablet Take 1 tablet (20 mg total) by mouth daily. 10/23/17   Milus Banister, MD  omeprazole (PRILOSEC) 20 MG capsule Take 1 capsule (20 mg total) by mouth daily. 07/23/17   Horton, Barbette Hair, MD  omeprazole (PRILOSEC) 20 MG capsule Take 1 capsule  (20 mg total) by mouth daily. 09/17/17   Milus Banister, MD  polyethylene glycol-electrolytes (NULYTELY/GOLYTELY) 420 g solution Take 4,000 mLs by mouth as directed. 09/18/17   Milus Banister, MD  polyethylene glycol-electrolytes (NULYTELY/GOLYTELY) 420 g solution Take 4,000 mLs by mouth as directed. 10/16/17   Milus Banister, MD  spironolactone (ALDACTONE) 100 MG tablet Take 1 tablet (100 mg total) by mouth daily. 11/06/17 02/04/18  Milus Banister, MD  tenofovir (VIREAD) 300 MG tablet Take 300 mg by mouth daily.    [provider]    No Known Allergies  Patient Active Problem List   Diagnosis Date Noted  . Diarrhea   . 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 Heart murmur 08/01/2017  . Hepatitis B 02/17/2015  . Elevated bilirubin   . Cirrhosis ([place]) 11/25/2014  . Thrombocytopenia ([place]) 11/25/2014  . History of TB (tuberculosis) 07/16/2010    Past Medical History:  Diagnosis Date  . Cirrhosis ([place])   . Hepatitis B   . Lymphocytic colitis   . 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     Past Surgical History:  Procedure Laterality Date  . BIOPSY  10/23/2017   Procedure: BIOPSY;  Surgeon: Milus Banister, MD;  Location: [facility];  Service: Endoscopy;;  . COLONOSCOPY WITH PROPOFOL N/A 10/23/2017   Procedure: COLONOSCOPY WITH PROPOFOL;  Surgeon: Milus Banister, MD;  Location: [facility];  Service: Endoscopy;  Laterality: N/A;  . COMPLEX WOUND CLOSURE Right 12/15/2015   Procedure: COMPLEX WOUND CLOSURE;  Surgeon: Leanora Cover, MD;  Location: [place];  Service: Orthopedics;  Laterality: Right;  . ESOPHAGOGASTRODUODENOSCOPY (EGD) WITH PROPOFOL N/A 10/23/2017   Procedure: ESOPHAGOGASTRODUODENOSCOPY (EGD) WITH PROPOFOL;  Surgeon: Milus Banister, MD;  Location: [facility];  Service: Endoscopy;  Laterality: N/A;  . I&D EXTREMITY Right 12/15/2015   Procedure: IRRIGATION AND DEBRIDEMENT AND REVISION AMPUTATION RIGHT RING FINGER;  Surgeon: Leanora Cover, MD;   Location: [place];  Service: Orthopedics;  Laterality: Right;    Social History   Socioeconomic History  . Marital status: Married    Spouse name: Not on file  . Number of children: 4  . Years of education: Not on file  . Highest education level: Not on file  Occupational History  . 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 Financial resource strain: Not on file  . Food insecurity:    Worry: Not on file    Inability: Not on file  . Transportation needs:    Medical: Not on file    Non-medical: Not on file  Tobacco Use  . Smoking status: Former Smoker    Packs/day: 0.50    Years: 8.00    Pack years: 4.00    Types: Cigarettes    Last attempt to quit: 04/08/1990    Years since quitting: 27.6  . Smokeless tobacco: Never Used  Substance and Sexual Activity  . Alcohol use: No  . Drug use: No  . Sexual activity: Yes  Lifestyle  . Physical activity:    Days per week: Not on file    Minutes per session: Not on file  . Stress: Not on file  Relationships  . Social connections:    Talks on phone: Not on file    Gets together: Not on file    Attends religious service: Not on file    Active member of club or organization: Not on file    Attends meetings of clubs or organizations: Not on file    Relationship status: Not on file  . Intimate partner violence:    Fear of current or ex partner: Not on file    Emotionally abused: Not on file    Physically abused: Not on file    Forced sexual activity: Not on file  Other Topics Concern  . Not on file  Social History Narrative  . Not on file    No family history on file.   Review of Systems  Constitutional: Negative.  Negative for chills and fever.  HENT: Negative.  Negative for congestion, nosebleeds and sore throat.   Eyes: Negative.  Negative for blurred vision and double vision.  Respiratory: Negative.  Negative for cough and shortness of breath.   Cardiovascular: Negative.  Negative for chest  pain and palpitations.  Gastrointestinal: Negative.  Negative for abdominal pain, blood in stool, diarrhea, melena, nausea and vomiting.  Genitourinary: Negative.  Negative for dysuria and hematuria.  Musculoskeletal: Negative.  Negative for back pain, myalgias and neck pain.  Skin: Negative.  Negative for rash.  Neurological: Negative.  Negative for dizziness and headaches.  Endo/Heme/Allergies: Negative.   All other systems reviewed and are negative.   Vitals:   12/03/17 1538  BP: (!) 89/44  Pulse: 69  Resp: 17  Temp: 98.2 F (36.8 C)  SpO2: 98%    Physical Exam  Constitutional: He is oriented to person, place, and time.  He appears well-developed and well-nourished.  HENT:  Head: Normocephalic and atraumatic.  Nose: Nose normal.  Mouth/Throat: Oropharynx is clear and moist.  Eyes: Pupils are equal, round, and reactive to light. Conjunctivae and EOM are normal.  Neck: Normal range of motion. Neck supple. No JVD present. No thyromegaly present.  Cardiovascular: Normal rate, regular rhythm, normal heart sounds and intact distal pulses.  Pulmonary/Chest: Effort normal and breath sounds normal.  Abdominal: Soft. Bowel sounds are normal. He exhibits no distension. There is no tenderness.  Musculoskeletal: Normal range of motion. He exhibits no edema or tenderness.  Lymphadenopathy:    He has no cervical adenopathy.  Neurological: He is alert and oriented to person, place, and time. No sensory deficit. He exhibits normal muscle tone.  Skin: Skin is warm and dry. Capillary refill takes less than 2 seconds.  Psychiatric: He has a normal mood and affect. His behavior is normal.  Vitals reviewed.  Results for orders placed or performed in visit on 12/03/17 (from the past 24 hour(s))  POCT glucose (manual entry)     Status: None   Collection Time: 12/03/17  4:18 PM  Result Value Ref Range   POC Glucose >444 70 - 99 mg/dl   A total of 40 minutes was spent in the room with the  patient, greater than 50% of which was in counseling/coordination of care regarding multiple chronic medical problems, medication review, blood work, no diabetes management, medications, and need for follow-up.   ASSESSMENT & PLAN:  Y was seen today for follow-up.  Diagnoses and all orders for this visit:  Hyperglycemia -     Hemoglobin A1c -     POCT glucose (manual entry)  Pancytopenia (HCC) -     CBC with Differential/Platelet  History of hepatitis B  Other cirrhosis of liver (HCC) -     Comprehensive metabolic panel  Cramps, extremity -     Comprehensive metabolic panel -     Vitamin B12 -     VITAMIN D 25 Hydroxy (Vit-D Deficiency, Fractures)  Abnormal echocardiogram -     Ambulatory referral to Cardiology  Liver cirrhosis secondary to NASH (nonalcoholic steatohepatitis) ([place])  New onset type 2 diabetes mellitus (HCC) -     Alogliptin Benzoate 12.5 MG TABS; Take 12.5 mg by mouth daily. -     metFORMIN (GLUCOPHAGE) 500 MG tablet; Take 1 tablet (500 mg total) by mouth 2 (two) times daily with a meal. -     Ambulatory referral to Endocrinology    Patient Instructions       If you have lab work done today you will be contacted with your lab results within the next 2 weeks.  If you have not heard from [place] then please contact us. The fastest way to get your results is to register for My Chart.   IF you received an x-ray today, you will receive an invoice from [facility]. Please contact [facility] at [phone] with questions or concerns regarding your invoice.   IF you received labwork today, you will receive an invoice from [facility]. Please contact [facility] at [phone] with questions or concerns regarding your invoice.   Our billing staff will not be able to assist you with questions regarding bills from these companies.  You will be contacted with the lab results as soon as they are available. The fastest way to get your results is to  activate your My Chart account. Instructions are located on the last page of this paperwork. If  you have not heard from [place] regarding the results in 2 weeks, please contact this office.      Diabetes Mellitus and Nutrition When you have diabetes (diabetes mellitus), it is very important to have healthy eating habits because your blood sugar (glucose) levels are greatly affected by what you eat and drink. Eating healthy foods in the appropriate amounts, at about the same times every day, can help you:  Control your blood glucose.  Lower your risk of heart disease.  Improve your blood pressure.  Reach or maintain a healthy weight.  Every person with diabetes is different, and each person has different needs for a meal plan. Your health care provider may recommend that you work with a diet and nutrition specialist (dietitian) to make a meal plan that is best for you. Your meal plan may vary depending on factors such as:  The calories you need.  The medicines you take.  Your weight.  Your blood glucose, blood pressure, and cholesterol levels.  Your activity level.  Other health conditions you have, such as heart or kidney disease.  How do carbohydrates affect me? Carbohydrates affect your blood glucose level more than any other type of food. Eating carbohydrates naturally increases the amount of glucose in your blood. Carbohydrate counting is a method for keeping track of how many carbohydrates you eat. Counting carbohydrates is important to keep your blood glucose at a healthy level, especially if you use insulin or take certain oral diabetes medicines. It is important to know how many carbohydrates you can safely have in each meal. This is different for every person. Your dietitian can help you calculate how many carbohydrates you should have at each meal and for snack. Foods that contain carbohydrates include:  Bread, cereal, rice, pasta, and crackers.  Potatoes and corn.  Peas,  beans, and lentils.  Milk and yogurt.  Fruit and juice.  Desserts, such as cakes, cookies, ice cream, and candy.  How does alcohol affect me? Alcohol can cause a sudden decrease in blood glucose (hypoglycemia), especially if you use insulin or take certain oral diabetes medicines. Hypoglycemia can be a life-threatening condition. Symptoms of hypoglycemia (sleepiness, dizziness, and confusion) are similar to symptoms of having too much alcohol. If your health care provider says that alcohol is safe for you, follow these guidelines:  Limit alcohol intake to no more than 1 drink per day for nonpregnant women and 2 drinks per day for men. One drink equals 12 oz of beer, 5 oz of wine, or 1 oz of hard liquor.  Do not drink on an empty stomach.  Keep yourself hydrated with water, diet soda, or unsweetened iced tea.  Keep in mind that regular soda, juice, and other mixers may contain a lot of sugar and must be counted as carbohydrates.  What are tips for following this plan? Reading food labels  Start by checking the serving size on the label. The amount of calories, carbohydrates, fats, and other nutrients listed on the label are based on one serving of the food. Many foods contain more than one serving per package.  Check the total grams (g) of carbohydrates in one serving. You can calculate the number of servings of carbohydrates in one serving by dividing the total carbohydrates by 15. For example, if a food has 30 g of total carbohydrates, it would be equal to 2 servings of carbohydrates.  Check the number of grams (g) of saturated and trans fats in one serving. Choose foods that have  low or no amount of these fats.  Check the number of milligrams (mg) of sodium in one serving. Most people should limit total sodium intake to less than 2,300 mg per day.  Always check the nutrition information of foods labeled as "low-fat" or "nonfat". These foods may be higher in added sugar or refined  carbohydrates and should be avoided.  Talk to your dietitian to identify your daily goals for nutrients listed on the label. Shopping  Avoid buying canned, premade, or processed foods. These foods tend to be high in fat, sodium, and added sugar.  Shop around the outside edge of the grocery store. This includes fresh fruits and vegetables, bulk grains, fresh meats, and fresh dairy. Cooking  Use low-heat cooking methods, such as baking, instead of high-heat cooking methods like deep frying.  Cook using healthy oils, such as olive, canola, or sunflower oil.  Avoid cooking with butter, cream, or high-fat meats. Meal planning  Eat meals and snacks regularly, preferably at the same times every day. Avoid going long periods of time without eating.  Eat foods high in fiber, such as fresh fruits, vegetables, beans, and whole grains. Talk to your dietitian about how many servings of carbohydrates you can eat at each meal.  Eat 4-6 ounces of lean protein each day, such as lean meat, chicken, fish, eggs, or tofu. 1 ounce is equal to 1 ounce of meat, chicken, or fish, 1 egg, or 1/4 cup of tofu.  Eat some foods each day that contain healthy fats, such as avocado, nuts, seeds, and fish. Lifestyle   Check your blood glucose regularly.  Exercise at least 30 minutes 5 or more days each week, or as told by your health care provider.  Take medicines as told by your health care provider.  Do not use any products that contain nicotine or tobacco, such as cigarettes and e-cigarettes. If you need help quitting, ask your health care provider.  Work with a Social worker or diabetes educator to identify strategies to manage stress and any emotional and social challenges. What are some questions to ask my health care provider?  Do I need to meet with a diabetes educator?  Do I need to meet with a dietitian?  What number can I call if I have questions?  When are the best times to check my blood  glucose? Where to find more information:  American Diabetes Association: diabetes.org/food-and-fitness/food  Academy of Nutrition and Dietetics: PokerClues.dk  [employer] of Diabetes and Digestive and Kidney Diseases (NIH): ContactWire.be Summary  A healthy meal plan will help you control your blood glucose and maintain a healthy lifestyle.  Working with a diet and nutrition specialist (dietitian) can help you make a meal plan that is best for you.  Keep in mind that carbohydrates and alcohol have immediate effects on your blood glucose levels. It is important to count carbohydrates and to use alcohol carefully. This information is not intended to replace advice given to you by your health care provider. Make sure you discuss any questions you have with your health care provider. Document Released: 12/20/2004 Document Revised: 04/29/2016 Document Reviewed: 04/29/2016 Elsevier Interactive Patient Education  2018 [employer].      Agustina Caroli, MD [facility]

## 2017-12-04 ENCOUNTER — Other Ambulatory Visit: Payer: Self-pay

## 2017-12-04 ENCOUNTER — Encounter: Payer: Self-pay | Admitting: Family Medicine

## 2017-12-04 ENCOUNTER — Emergency Department (HOSPITAL_COMMUNITY)
Admission: EM | Admit: 2017-12-04 | Discharge: 2017-12-05 | Disposition: A | Discharge status: Home / Self Care | Payer: PRIVATE HEALTH INSURANCE | Attending: Emergency Medicine | Admitting: Emergency Medicine

## 2017-12-04 ENCOUNTER — Telehealth: Payer: Self-pay

## 2017-12-04 ENCOUNTER — Ambulatory Visit: Payer: No Typology Code available for payment source | Admitting: Family Medicine

## 2017-12-04 ENCOUNTER — Encounter (HOSPITAL_COMMUNITY): Payer: Self-pay | Admitting: Emergency Medicine

## 2017-12-04 VITALS — BP 105/61 | HR 61 | Temp 98.0°F | Ht 63.0 in | Wt 130.0 lb

## 2017-12-04 DIAGNOSIS — Z87891 Personal history of nicotine dependence: Secondary | ICD-10-CM | POA: Diagnosis not present

## 2017-12-04 DIAGNOSIS — Z79899 Other long term (current) drug therapy: Secondary | ICD-10-CM | POA: Insufficient documentation

## 2017-12-04 DIAGNOSIS — Z7984 Long term (current) use of oral hypoglycemic drugs: Secondary | ICD-10-CM | POA: Insufficient documentation

## 2017-12-04 DIAGNOSIS — E1165 Type 2 diabetes mellitus with hyperglycemia: Secondary | ICD-10-CM | POA: Diagnosis present

## 2017-12-04 DIAGNOSIS — D696 Thrombocytopenia, unspecified: Secondary | ICD-10-CM | POA: Diagnosis not present

## 2017-12-04 DIAGNOSIS — R739 Hyperglycemia, unspecified: Secondary | ICD-10-CM

## 2017-12-04 LAB — CBC WITH DIFFERENTIAL/PLATELET
ABS IMMATURE GRANULOCYTES: 0 10*3/uL (ref 0.0–0.1)
BASOS ABS: 0 10*3/uL (ref 0.0–0.1)
BASOS PCT: 0 %
BASOS: 0 %
Basophils Absolute: 0 10*3/uL (ref 0.0–0.2)
EOS (ABSOLUTE): 0.1 10*3/uL (ref 0.0–0.4)
Eos: 3 %
Eosinophils Absolute: 0.1 10*3/uL (ref 0.0–0.7)
Eosinophils Relative: 4 %
HEMATOCRIT: 33 % — AB (ref 37.5–51.0)
HEMATOCRIT: 33.4 % — AB (ref 39.0–52.0)
Hemoglobin: 10.8 g/dL — ABNORMAL LOW (ref 13.0–17.7)
Hemoglobin: 11 g/dL — ABNORMAL LOW (ref 13.0–17.0)
IMMATURE GRANS (ABS): 0 10*3/uL (ref 0.0–0.1)
Immature Granulocytes: 0 %
Immature Granulocytes: 0 %
LYMPHS PCT: 33 %
LYMPHS: 32 %
Lymphocytes Absolute: 0.9 10*3/uL (ref 0.7–3.1)
Lymphs Abs: 1 10*3/uL (ref 0.7–4.0)
MCH: 33.6 pg — ABNORMAL HIGH (ref 26.6–33.0)
MCH: 34.1 pg — AB (ref 26.0–34.0)
MCHC: 32.7 g/dL (ref 31.5–35.7)
MCHC: 32.9 g/dL (ref 30.0–36.0)
MCV: 103 fL — AB (ref 79–97)
MCV: 103.4 fL — ABNORMAL HIGH (ref 78.0–100.0)
MONOCYTES: 11 %
MONOS PCT: 11 %
Monocytes Absolute: 0.3 10*3/uL (ref 0.1–0.9)
Monocytes Absolute: 0.3 10*3/uL (ref 0.1–1.0)
NEUTROS ABS: 1.5 10*3/uL (ref 1.4–7.0)
NEUTROS PCT: 52 %
Neutro Abs: 1.5 10*3/uL — ABNORMAL LOW (ref 1.7–7.7)
Neutrophils: 54 %
PLATELETS: 24 10*3/uL — AB (ref 150–450)
Platelets: 24 10*3/uL — CL (ref 150–400)
RBC: 3.21 x10E6/uL — ABNORMAL LOW (ref 4.14–5.80)
RBC: 3.23 MIL/uL — ABNORMAL LOW (ref 4.22–5.81)
RDW: 15.9 % — ABNORMAL HIGH (ref 12.3–15.4)
RDW: 14.1 % (ref 11.5–15.5)
WBC: 2.8 10*3/uL — ABNORMAL LOW (ref 3.4–10.8)
WBC: 2.9 10*3/uL — ABNORMAL LOW (ref 4.0–10.5)

## 2017-12-04 LAB — COMPREHENSIVE METABOLIC PANEL
A/G RATIO: 0.9 — AB (ref 1.2–2.2)
ALK PHOS: 80 U/L (ref 38–126)
ALT: 44 IU/L (ref 0–44)
ALT: 43 U/L (ref 0–44)
ANION GAP: 6 (ref 5–15)
AST: 38 IU/L (ref 0–40)
AST: 38 U/L (ref 15–41)
Albumin: 2.7 g/dL — ABNORMAL LOW (ref 3.5–5.5)
Albumin: 2.5 g/dL — ABNORMAL LOW (ref 3.5–5.0)
Alkaline Phosphatase: 92 IU/L (ref 39–117)
BILIRUBIN TOTAL: 2 mg/dL — AB (ref 0.3–1.2)
BUN/Creatinine Ratio: 14 (ref 9–20)
BUN: 15 mg/dL (ref 6–24)
BUN: 12 mg/dL (ref 6–20)
Bilirubin Total: 1.5 mg/dL — ABNORMAL HIGH (ref 0.0–1.2)
CALCIUM: 7.9 mg/dL — AB (ref 8.7–10.2)
CALCIUM: 8.2 mg/dL — AB (ref 8.9–10.3)
CO2: 26 mmol/L (ref 20–29)
CO2: 27 mmol/L (ref 22–32)
CREATININE: 1.11 mg/dL (ref 0.76–1.27)
CREATININE: 1.13 mg/dL (ref 0.61–1.24)
Chloride: 95 mmol/L — ABNORMAL LOW (ref 96–106)
Chloride: 98 mmol/L (ref 98–111)
GFR calc non Af Amer: 78 mL/min/{1.73_m2} (ref >59)
GFR calc non Af Amer: >60 mL/min (ref >60)
GFR, EST AFRICAN AMERICAN: 90 mL/min/{1.73_m2} (ref >59)
GLOBULIN, TOTAL: 3.1 g/dL (ref 1.5–4.5)
GLUCOSE: 437 mg/dL — AB (ref 70–99)
Glucose: 541 mg/dL (ref 65–99)
POTASSIUM: 4.2 mmol/L (ref 3.5–5.2)
Potassium: 4.1 mmol/L (ref 3.5–5.1)
Sodium: 131 mmol/L — ABNORMAL LOW (ref 134–144)
Sodium: 131 mmol/L — ABNORMAL LOW (ref 135–145)
TOTAL PROTEIN: 5.8 g/dL — AB (ref 6.0–8.5)
TOTAL PROTEIN: 5.9 g/dL — AB (ref 6.5–8.1)

## 2017-12-04 LAB — VITAMIN B12: VITAMIN B 12: 1586 pg/mL — AB (ref 232–1245)

## 2017-12-04 LAB — VITAMIN D 25 HYDROXY (VIT D DEFICIENCY, FRACTURES): VIT D 25 HYDROXY: 14.7 ng/mL — AB (ref 30.0–100.0)

## 2017-12-04 LAB — POCT CBC
Granulocyte percent: 58.3 %G (ref 37–80)
HCT, POC: 33.3 % — AB (ref 43.5–53.7)
HEMOGLOBIN: 10 g/dL — AB (ref 14.1–18.1)
Lymph, poc: 0.9 (ref 0.6–3.4)
MCH: 30.4 pg (ref 27–31.2)
MCHC: 30.1 g/dL — AB (ref 31.8–35.4)
MCV: 101.2 fL — AB (ref 80–97)
MID (cbc): 0.2 (ref 0–0.9)
MPV: 9 fL (ref 0–99.8)
PLATELET COUNT, POC: 28 10*3/uL — AB (ref 142–424)
POC GRANULOCYTE: 1.6 — AB (ref 2–6.9)
POC LYMPH PERCENT: 33.1 %L (ref 10–50)
POC MID %: 8.6 %M (ref 0–12)
RBC: 3.29 M/uL — AB (ref 4.69–6.13)
RDW, POC: 17.5 %
WBC: 2.8 10*3/uL — AB (ref 4.6–10.2)

## 2017-12-04 LAB — GLUCOSE, POCT (MANUAL RESULT ENTRY)

## 2017-12-04 LAB — CBG MONITORING, ED: Glucose-Capillary: 411 mg/dL — ABNORMAL HIGH (ref 70–99)

## 2017-12-04 LAB — HEMOGLOBIN A1C
Est. average glucose Bld gHb Est-mCnc: 163 mg/dL
Hgb A1c MFr Bld: 7.3 % — ABNORMAL HIGH (ref 4.8–5.6)

## 2017-12-04 MED ORDER — SODIUM CHLORIDE 0.9 % IV BOLUS
1000.0000 mL | Freq: Once | INTRAVENOUS | Status: AC
  Administered 2017-12-04: 1000 mL via INTRAVENOUS

## 2017-12-04 NOTE — ED Triage Notes (Signed)
Pt reports he has been having high blood sugar.  Was seen today by primary provider, sent here. New onset of diabetes.   Provider in triage

## 2017-12-04 NOTE — Telephone Encounter (Signed)
Patient seen in office

## 2017-12-04 NOTE — Telephone Encounter (Signed)
Critical lab faxed this am. Reviewed with Dr. Pamella Pert.  Concern for Glucose 541 and hypotensive at visit 8/29 Needs to be seen today.   Larene Beach made appt - home phone not working.  She left 3 messages at 3 different contacts to call and appt today.   Message and info to Dr. Carlota Raspberry.

## 2017-12-04 NOTE — Patient Instructions (Addendum)
   Unfortunately due to your elevated blood sugar (too high to read in our office), you will need to have further testing in the emergency room.  Additionally platelets were also elevated. Please proceed to emergency room for further testing.   If you have lab work done today you will be contacted with your lab results within the next 2 weeks.  If you have not heard from [place] then please contact us. The fastest way to get your results is to register for My Chart.   IF you received an x-ray today, you will receive an invoice from [facility]. Please contact [facility] at [phone] with questions or concerns regarding your invoice.   IF you received labwork today, you will receive an invoice from [place]. Please contact [facility] at [phone] with questions or concerns regarding your invoice.   Our billing staff will not be able to assist you with questions regarding bills from these companies.  You will be contacted with the lab results as soon as they are available. The fastest way to get your results is to activate your My Chart account. Instructions are located on the last page of this paperwork. If you have not heard from [place] regarding the results in 2 weeks, please contact this office.

## 2017-12-04 NOTE — ED Provider Notes (Signed)
Patient placed in Quick Look pathway, seen and evaluated   Chief Complaint: elevated blood sugar  HPI:  Jose Snow is a 47 y.o. male who present to the ED with new onset diabetes. Not from office visit today states that  Glucose was 260 when checked on August 21, 541 on send out labs from yesterday.  Started on metformin 500 mg twice daily and referred to endocrinology.  Hemoglobin A1c 7.3 yesterday. Was not able to start meds yet. No nausea/vomiting.  Has been having some abdominal pain, with some vomiting past 2 weeks. Has had some blurry vision at times when not able to eat.   Also noted to have history of cirrhosis with chronic hepatitis B, and pancytopenia. Platelets 24 yesterday, he did have a platelet transfusion on August 8.  Platelets 18 in July. No recent bleeding.   ROS: General: tired, no appetite, weight loss   Physical Exam:  BP (!) 107/48 (BP Location: Right Arm)   Pulse 63   Temp 98.5 F (36.9 C) (Oral)   Resp 18   SpO2 98%    Gen: No distress  Neuro: Awake and Alert  Skin: Warm and dry      Initiation of care has begun. The patient has been counseled on the process, plan, and necessity for staying for the completion/evaluation, and the remainder of the medical screening examination    Ashley Murrain, NP 12/04/17 2034    Hayden Rasmussen, MD 12/11/17 [phone]

## 2017-12-04 NOTE — Progress Notes (Signed)
Subjective:    Patient ID: Jose Snow, male    DOB: June 27, 1970, 47 y.o.   MRN: [id number]  HPI Jose Snow is a 47 y.o. male Presents today for: Chief Complaint  Patient presents with  . Hyperglycemia   Patient seen acutely as a work in visit today due to elevated glucose noted on labs obtained yesterday. He was seen by Dr. Mitchel Honour.  Glucose was 260 when checked on August 21, 541 on send out labs from yesterday.  Started on metformin 500 mg twice daily and referred to endocrinology.  Hemoglobin A1c 7.3 yesterday. Was not able to start meds yet. No nausea/vomiting.  Has been having some abdominal pain, with some vomiting past 2 weeks. Has had some blurry vision at times when not able to eat.   Also noted to have history of cirrhosis with chronic hepatitis B, and pancytopenia. Platelets 24 yesterday, he did have a platelet transfusion on August 8.  Platelets 18 in July. No recent bleeding.   Patient Active Problem List   Diagnosis Date Noted  . Pancytopenia ([place]) 12/03/2017  . Hyperglycemia 12/03/2017  . Cramps, extremity 12/03/2017  . Abnormal echocardiogram 12/03/2017  . New onset type 2 diabetes mellitus ([place]) 12/03/2017  . History of hepatitis B 12/03/2017  . Diarrhea   . Esophageal varices without bleeding ([place])   . Heart murmur 08/01/2017  . Hepatitis B 02/17/2015  . Elevated bilirubin   . Liver cirrhosis secondary to NASH (nonalcoholic steatohepatitis) ([place]) 11/25/2014  . Thrombocytopenia ([place]) 11/25/2014  . History of TB (tuberculosis) 07/16/2010   Past Medical History:  Diagnosis Date  . Cirrhosis ([place])   . Hepatitis B   . Lymphocytic colitis   . 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    Past Surgical History:  Procedure Laterality Date  . BIOPSY  10/23/2017   Procedure: BIOPSY;  Surgeon: Milus Banister, MD;  Location: [facility];  Service: Endoscopy;;  . COLONOSCOPY WITH PROPOFOL N/A 10/23/2017   Procedure: COLONOSCOPY WITH PROPOFOL;   Surgeon: Milus Banister, MD;  Location: [facility];  Service: Endoscopy;  Laterality: N/A;  . COMPLEX WOUND CLOSURE Right 12/15/2015   Procedure: COMPLEX WOUND CLOSURE;  Surgeon: Leanora Cover, MD;  Location: [facility];  Service: Orthopedics;  Laterality: Right;  . ESOPHAGOGASTRODUODENOSCOPY (EGD) WITH PROPOFOL N/A 10/23/2017   Procedure: ESOPHAGOGASTRODUODENOSCOPY (EGD) WITH PROPOFOL;  Surgeon: Milus Banister, MD;  [facility];  Service: Endoscopy;  Laterality: N/A;  . I&D EXTREMITY Right 12/15/2015   Procedure: IRRIGATION AND DEBRIDEMENT AND REVISION AMPUTATION RIGHT RING FINGER;  Surgeon: Leanora Cover, MD;  Location: [facility];  Service: Orthopedics;  Laterality: Right;   No Known Allergies Prior to Admission medications   Medication Sig Start Date End Date Taking? Authorizing Provider  budesonide (ENTOCORT EC) 3 MG 24 hr capsule Take 3 capsules (9 mg total) by mouth daily. 10/30/17  Yes Milus Banister, MD  furosemide (LASIX) 40 MG tablet Take 1 tablet (40 mg total) by mouth daily. 11/06/17 02/04/18 Yes Milus Banister, MD  levocetirizine (XYZAL) 5 MG tablet Take 1 tablet (5 mg total) by mouth every evening. 07/11/17  Yes Tereasa Coop, PA-C  metFORMIN (GLUCOPHAGE) 500 MG tablet Take 1 tablet (500 mg total) by mouth 2 (two) times daily with a meal. 12/03/17  Yes Sagardia, Ines Bloomer, MD  omeprazole (PRILOSEC) 20 MG capsule Take 1 capsule (20 mg total) by mouth daily. 07/23/17  Yes Horton, Barbette Hair, MD  omeprazole (PRILOSEC) 20 MG capsule Take 1 capsule (20 mg total) by mouth daily. 09/17/17  Yes Milus Banister, MD  polyethylene glycol-electrolytes (NULYTELY/GOLYTELY) 420 g solution Take 4,000 mLs by mouth as directed. 09/18/17  Yes Milus Banister, MD  polyethylene glycol-electrolytes (NULYTELY/GOLYTELY) 420 g solution Take 4,000 mLs by mouth as directed. 10/16/17  Yes Milus Banister, MD  spironolactone (ALDACTONE) 100 MG tablet Take 1 tablet (100 mg total) by mouth daily. 11/06/17 02/04/18  Yes Milus Banister, MD  tenofovir (VIREAD) 300 MG tablet Take 300 mg by mouth daily.   Yes [provider]  Alogliptin Benzoate 12.5 MG TABS Take 12.5 mg by mouth daily. Patient not taking: Reported on 12/04/2017 12/03/17   Horald Pollen, MD  nadolol (CORGARD) 20 MG tablet Take 1 tablet (20 mg total) by mouth daily. Patient not taking: Reported on 12/03/2017 10/23/17   Milus Banister, MD   Social History   Socioeconomic History  . Marital status: Married    Spouse name: Not on file  . Number of children: 4  . Years of education: Not on file  . Highest education level: Not on file  Occupational History  . 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 Financial resource strain: Not on file  . Food insecurity:    Worry: Not on file    Inability: Not on file  . Transportation needs:    Medical: Not on file    Non-medical: Not on file  Tobacco Use  . Smoking status: Former Smoker    Packs/day: 0.50    Years: 8.00    Pack years: 4.00    Types: Cigarettes    Last attempt to quit: 04/08/1990    Years since quitting: 27.6  . Smokeless tobacco: Never Used  Substance and Sexual Activity  . Alcohol use: No  . Drug use: No  . Sexual activity: Yes  Lifestyle  . Physical activity:    Days per week: Not on file    Minutes per session: Not on file  . Stress: Not on file  Relationships  . Social connections:    Talks on phone: Not on file    Gets together: Not on file    Attends religious service: Not on file    Active member of club or organization: Not on file    Attends meetings of clubs or organizations: Not on file    Relationship status: Not on file  . Intimate partner violence:    Fear of current or ex partner: Not on file    Emotionally abused: Not on file    Physically abused: Not on file    Forced sexual activity: Not on file  Other Topics Concern  . Not on file  Social History Narrative  . Not on file   Review of Systems      Objective:   Physical Exam  Constitutional: He is oriented to person, place, and time. He appears well-developed and well-nourished.  HENT:  Head: Normocephalic and atraumatic.  Eyes: Pupils are equal, round, and reactive to light. EOM are normal.  Neck: No JVD present. Carotid bruit is not present.  Cardiovascular: Normal rate, regular rhythm and normal heart sounds.  No murmur heard. Pulmonary/Chest: Effort normal and breath sounds normal. He has no rales.  Abdominal: Soft. There is no tenderness.  Musculoskeletal: He exhibits no edema.  Neurological: He is alert and oriented to person, place, and time.  Skin: Skin  is warm and dry.  Psychiatric: He has a normal mood and affect.  Vitals reviewed.  Vitals:   12/04/17 1721  BP: 105/61  Pulse: 61  Temp: 98 F (36.7 C)  TempSrc: Oral  SpO2: 98%  Weight: 130 lb (59 kg)  Height: 5' 3"  (1.6 m)      Results for orders placed or performed in visit on 12/04/17  POCT glucose (manual entry)  Result Value Ref Range   POC Glucose HHH 70 - 99 mg/dl  POCT CBC  Result Value Ref Range   WBC 2.8 (A) 4.6 - 10.2 K/uL   Lymph, poc 0.9 0.6 - 3.4   POC LYMPH PERCENT 33.1 10 - 50 %L   MID (cbc) 0.2 0 - 0.9   POC MID % 8.6 0 - 12 %M   POC Granulocyte 1.6 (A) 2 - 6.9   Granulocyte percent 58.3 37 - 80 %G   RBC 3.29 (A) 4.69 - 6.13 M/uL   Hemoglobin 10.0 (A) 14.1 - 18.1 g/dL   HCT, POC 33.3 (A) 43.5 - 53.7 %   MCV 101.2 (A) 80 - 97 fL   MCH, POC 30.4 27 - 31.2 pg   MCHC 30.1 (A) 31.8 - 35.4 g/dL   RDW, POC 17.5 %   Platelet Count, POC 28 (A) 142 - 424 K/uL   MPV 9.0 0 - 99.8 fL       Assessment & Plan:  Jose Snow is a 47 y.o. male Hyperglycemia - Plan: POCT glucose (manual entry), Basic metabolic panel  -too high to read in office yesterday, with reading in the 500s on send out labs. Was not able to start medication from yesterday. Unfortunately too high to read again in the office today. On further discussion he has had some  abdominal pain with intermittent nausea/vomiting over the past few weeks, and decreased appetite.  With decreased appetite has had some blurry vision.  No acidosis seen on BMP yesterday, but unknown degree of hyperglycemia today.  -Further evaluation through emergency room tonight, plans ot proceed by private vehicle.   Thrombocytopenia ([place]) - Plan: POCT CBC  -Repeat similar to testing yesterday.  History of pancytopenia, thrombocytopenia with cirrhosis as above.  Further evaluation through ER.  Denies active bleeding at this time.  No orders of the defined types were placed in this encounter.  Patient Instructions     Unfortunately due to your elevated blood sugar (too high to read in our office), you will need to have further testing in the emergency room.  Additionally platelets were also elevated. Please proceed to emergency room for further testing.   If you have lab work done today you will be contacted with your lab results within the next 2 weeks.  If you have not heard from [place] then please contact us. The fastest way to get your results is to register for My Chart.   IF you received an x-ray today, you will receive an invoice from [facility]. Please contact [facility] at [phone] with questions or concerns regarding your invoice.   IF you received labwork today, you will receive an invoice from [facility]. Please contact [facility] at [phone] with questions or concerns regarding your invoice.   Our billing staff will not be able to assist you with questions regarding bills from these companies.  You will be contacted with the lab results as soon as they are available. The fastest way to get your results is to activate your My Chart account. Instructions  are located on the last page of this paperwork. If you have not heard from [place] regarding the results in 2 weeks, please contact this office.         Signed,   Merri Ray, MD [facility] at  [place].  12/04/17 7:02 PM

## 2017-12-04 NOTE — ED Notes (Signed)
CBG 411

## 2017-12-04 NOTE — ED Notes (Signed)
Pt knows we need urine sample, pt given urinal

## 2017-12-05 ENCOUNTER — Encounter: Payer: Self-pay | Admitting: Gastroenterology

## 2017-12-05 ENCOUNTER — Ambulatory Visit (INDEPENDENT_AMBULATORY_CARE_PROVIDER_SITE_OTHER): Payer: PRIVATE HEALTH INSURANCE | Admitting: Gastroenterology

## 2017-12-05 VITALS — BP 96/60 | HR 56 | Ht 62.5 in | Wt 129.8 lb

## 2017-12-05 DIAGNOSIS — K7460 Unspecified cirrhosis of liver: Secondary | ICD-10-CM

## 2017-12-05 LAB — URINALYSIS, ROUTINE W REFLEX MICROSCOPIC
Bacteria, UA: NONE SEEN
Bilirubin Urine: NEGATIVE
KETONES UR: NEGATIVE mg/dL
Leukocytes, UA: NEGATIVE
Nitrite: NEGATIVE
PH: 6 (ref 5.0–8.0)
Protein, ur: NEGATIVE mg/dL
Specific Gravity, Urine: 1.021 (ref 1.005–1.030)

## 2017-12-05 LAB — BASIC METABOLIC PANEL
BUN / CREAT RATIO: 14 (ref 9–20)
BUN: 13 mg/dL (ref 6–24)
CHLORIDE: 94 mmol/L — AB (ref 96–106)
CO2: 26 mmol/L (ref 20–29)
Calcium: 8.1 mg/dL — ABNORMAL LOW (ref 8.7–10.2)
Creatinine, Ser: 0.96 mg/dL (ref 0.76–1.27)
GFR calc Af Amer: 107 mL/min/{1.73_m2} (ref >59)
GFR calc non Af Amer: 92 mL/min/{1.73_m2} (ref >59)
GLUCOSE: 550 mg/dL — AB (ref 65–99)
POTASSIUM: 5 mmol/L (ref 3.5–5.2)
SODIUM: 130 mmol/L — AB (ref 134–144)

## 2017-12-05 LAB — I-STAT TROPONIN, ED: Troponin i, poc: 0.01 ng/mL (ref 0.00–0.08)

## 2017-12-05 LAB — CBG MONITORING, ED
Glucose-Capillary: 408 mg/dL — ABNORMAL HIGH (ref 70–99)
Glucose-Capillary: 475 mg/dL — ABNORMAL HIGH (ref 70–99)

## 2017-12-05 MED ORDER — SODIUM CHLORIDE 0.9 % IV BOLUS
500.0000 mL | Freq: Once | INTRAVENOUS | Status: AC
  Administered 2017-12-05: 500 mL via INTRAVENOUS

## 2017-12-05 MED ORDER — INSULIN ASPART 100 UNIT/ML ~~LOC~~ SOLN
4.0000 [IU] | Freq: Once | SUBCUTANEOUS | Status: AC
  Administered 2017-12-05: 4 [IU] via SUBCUTANEOUS
  Filled 2017-12-05: qty 1

## 2017-12-05 MED ORDER — OMEPRAZOLE 40 MG PO CPDR: 40.0000 mg | DELAYED_RELEASE_CAPSULE | Freq: Every day | ORAL | 11 refills | Status: DC

## 2017-12-05 MED ORDER — SODIUM CHLORIDE 0.9 % IV BOLUS: 500.0000 mL | Freq: Once | INTRAVENOUS | Status: DC

## 2017-12-05 MED ORDER — METFORMIN HCL 500 MG PO TABS
500.0000 mg | ORAL_TABLET | Freq: Once | ORAL | Status: AC
  Administered 2017-12-05: 500 mg via ORAL
  Filled 2017-12-05: qty 1

## 2017-12-05 NOTE — Progress Notes (Signed)
Review of pertinent gastrointestinal problems: 1. 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  Current MELD: 10/2017 18  EGD 10/2017, [name]; small to medium sized esophageal varices, + portal gastropathy. Started nadolol 54m daily.  AFP 09/2017: 13.7  Liver imaging 08/2017 CT without IV contrast: cirrhosis, splenomegaly, mild ascites, signs of portal HTN  [facility] Liver Transplant evaluation initiated 10/2017 Dr. ZLurlean Leyden 2. Lymphocytic colitis: colonoscopy 10/2017 Dr. JArdis Hughsfor chronic diarrhea: hemorrhoids, o/w normal. Random biopsies + lymphocytic colitis. Started on budesonide 947mdaily.   HPI: This is a very pleasant 47year old man whom I last saw 49 months ago the time of a colonoscopy.  Non english speaker.  Professional interpreter present.  He had a very high fever after his colonoscopy; family says up to [id number].  We were never informed of that.  He has epigastric burning, chronic. Can make him nauseous.   We believe he is taking his budesonide.  3 pills once daily.  Probably not helping very much.  Imodium does seem to help at one pill once daily dosing.   He is actually not taking his nadolol.  Has some mild memory difficulties.  He has been having significant difficulty with elevated blood sugars.   Chief complaint is cirrhosis,  decompensated  ROS: complete GI ROS as described in HPI, all other review negative.  Constitutional:  No unintentional weight loss   Past Medical History:  Diagnosis Date  . Cir[place]osis ([place]  . Diabetes mellitus ([place]  . Hepatitis B   . Lymphocytic colitis   . 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     Past Surgical History:  Procedure Laterality Date  . BIOPSY  10/23/2017   Procedure: BIOPSY;  Surgeon: JaMilus BanisterMD;  [facility];  Service: Endoscopy;;  . COLONOSCOPY WITH PROPOFOL N/A 10/23/2017   Procedure: COLONOSCOPY WITH PROPOFOL;  Surgeon: JaMilus BanisterMD;  [facility];  Service: Endoscopy;  Laterality: N/A;  . COMPLEX WOUND CLOSURE Right 12/15/2015   Procedure: COMPLEX WOUND CLOSURE;  Surgeon: KeLeanora CoverMD;  [place] [facility];  Laterality: Right;  . ESOPHAGOGASTRODUODENOSCOPY (EGD) WITH PROPOFOL N/A 10/23/2017   Procedure: ESOPHAGOGASTRODUODENOSCOPY (EGD) WITH PROPOFOL;  Surgeon: JaMilus BanisterMD;  [facility];  Service: Endoscopy;  Laterality: N/A;  . I&D EXTREMITY Right 12/15/2015   Procedure: IRRIGATION AND DEBRIDEMENT AND REVISION AMPUTATION RIGHT RING FINGER;  Surgeon: KeLeanora CoverMD;  [facility]: [facility];  Laterality: Right;    Current Outpatient Medications  Medication Sig Dispense Refill  . budesonide (ENTOCORT EC) 3 MG 24 hr capsule Take 3 capsules (9 mg total) by mouth daily. 90 capsule 3  . furosemide (LASIX) 40 MG tablet Take 1 tablet (40 mg total) by mouth daily. 90 tablet 3  . levocetirizine (XYZAL) 5 MG tablet Take 1 tablet (5 mg total) by mouth every evening. 30 tablet 0  . omeprazole (PRILOSEC) 20 MG capsule Take 1 capsule (20  mg total) by mouth daily. 30 capsule 0  . omeprazole (PRILOSEC) 20 MG capsule Take 1 capsule (20 mg total) by mouth daily. 90 capsule 3  . spironolactone (ALDACTONE) 100 MG tablet Take 1 tablet (100 mg total) by mouth daily. 90  tablet 3  . tenofovir (VIREAD) 300 MG tablet Take 300 mg by mouth daily.    . Alogliptin Benzoate 12.5 MG TABS Take 12.5 mg by mouth daily. (Patient not taking: Reported on 12/04/2017) 30 tablet 3  . metFORMIN (GLUCOPHAGE) 500 MG tablet Take 1 tablet (500 mg total) by mouth 2 (two) times daily with a meal. (Patient not taking: Reported on 12/05/2017) 180 tablet 3  . nadolol (CORGARD) 20 MG tablet Take 1 tablet (20 mg total) by mouth daily. (Patient not taking: Reported on 12/03/2017) 30 tablet 3   No current facility-administered medications for this visit.     Allergies as of 12/05/2017  . (No Known Allergies)    Family History  Problem Relation Age of Onset  . Colon cancer Neg Hx   . Esophageal cancer Neg Hx   . Pancreatic cancer Neg Hx   . Stomach cancer Neg Hx   . Liver disease Neg Hx     Social History   Socioeconomic History  . Marital status: Married    Spouse name: Not on file  . Number of children: 4  . Years of education: Not on file  . Highest education level: Not on file  Occupational History  . 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 Financial resource strain: Not on file  . Food insecurity:    Worry: Not on file    Inability: Not on file  . Transportation needs:    Medical: Not on file    Non-medical: Not on file  Tobacco Use  . Smoking status: Former Smoker    Packs/day: 0.50    Years: 8.00    Pack years: 4.00    Types: Cigarettes    Last attempt to quit: 04/08/1990    Years since quitting: 27.6  . Smokeless tobacco: Never Used  Substance and Sexual Activity  . Alcohol use: No  . Drug use: No  . Sexual activity: Yes  Lifestyle  . Physical activity:    Days per week: Not on file    Minutes per session: Not on file  . Stress: Not on file  Relationships  . Social connections:    Talks on phone: Not on file    Gets together: Not on file    Attends religious service: Not on file    Active member of club or organization:  Not on file    Attends meetings of clubs or organizations: Not on file    Relationship status: Not on file  . Intimate partner violence:    Fear of current or ex partner: Not on file    Emotionally abused: Not on file    Physically abused: Not on file    Forced sexual activity: Not on file  Other Topics Concern  . Not on file  Social History Narrative  . Not on file     Physical Exam: BP 96/60   Pulse (!) 56   Ht 5' 2.5" (1.588 m)   Wt 129 lb 12.8 oz (58.9 kg)   BMI 23.36 kg/m  Constitutional: generally well-appearing Psychiatric: alert and oriented x3 Abdomen: soft, nontender, nondistended, no obvious ascites, no peritoneal signs, normal bowel sounds No peripheral edema noted  in lower extremities  Assessment and plan: 47 y.o. male with decompensated cirrhosis  First his significant elevation of blood sugars started shortly after he started taking the budesonide and for recently diagnosed lymphocytic colitis.  He does not think that medicine was helping him very much as well.  I recommended that he completely stop taking the budesonide.  He believes Imodium helps him better and if that works that is a much better, less systemically toxic solution than the budesonide.  With that in mind I recommend he not start his diabetic medicines that were just recently prescribed to him yet.  I am hoping that if he stops the budesonide his blood sugars will be much more manageable.  He is not sure if he was taking his nadolol but I asked him to call back after he looks at his pills and can better advise on that.  He will get a repeat set of labs in 1 week, basic metabolic profile.  Overall, I am struck that he still does not seem to understand that this is a chronic serious condition of that he will be dealing with for many years at least.  I feel like I have had a discussion with him, family members and a family friend, caregiver several times.  It just may not be sinking in to him.  He will  return to see an extender in 1 month and me in 2 months.  Please see the "Patient Instructions" section for addition details about the plan.  Owens Loffler, MD [facility] 12/05/2017, 9:03 AM

## 2017-12-05 NOTE — Discharge Instructions (Addendum)
Your blood sugar was elevated in the ED however it is decreasing.  I would recommend you taking the metformin as prescribed by your primary care doctor.  Drink plenty of water stay hydrated.  Have given you a nutritional guide.  Avoid starches, carbohydrates and sugars.  Please follow-up with your scheduled primary care doctor visit this morning.  Return to the ED if you develop any worsening symptoms.

## 2017-12-05 NOTE — Patient Instructions (Addendum)
Stop taking your budesonide since it isn't working.  Continue taking imodium one pill once daily every morning.  New prescription for omeprazole 44m pill, one pill once daily 20-30 minutes before breakfast.  Stop your other prilosec/omeprazole meds.  Do not start your new diabetes medicines yet.  Labs in one week (bmet).  Keep your October appt with Dr. ZLurlean Leyden(check on timing of that appointment).  Please call back today or soon about corguard/nadolol (has he been this medicine lately or not???).  Please return to see with Dr. JArdis Hughsin 2 months on 02/03/18 at 330pm.  OV with extender in 4 weeks. On 12/29/17 at 9am

## 2017-12-05 NOTE — ED Provider Notes (Signed)
[facility] EMERGENCY DEPARTMENT Provider Note   CSN: [id number] Arrival date & time: 12/04/17  2002     History   Chief Complaint Chief Complaint  Patient presents with  . Hyperglycemia    HPI Jose Snow is a 47 y.o. male.  HPI 47 year old male past medical history significant for this secondary to hepatitis B, pancytopenia that presents to the emergency department today after being referred by his primary care doctor for hyperglycemia.  Patient was seen in the primary care office on 8/21 where he had an elevated glucose at that time.  Send out labs notify them that his blood sugar was 551.  Patient's A1c was 7.3.  He was called back to the office today for further evaluation.  At that time their monitor was reading high and they sent patient to the ED for further evaluation.  Patient was started on metformin however he has not had this filled yet.  Patient was also referred to endocrinology which he has not followed up with yet.  He reports some generalized abdominal pain and vomiting that him and his daughter both state has been present since he was diagnosed with cirrhosis and has not worsened since he was told that his blood sugar was elevated.  Patient reports some blurry vision at times were not able to eat but denies any blurry vision at this time.  Patient does have a history of thrombocytopenia.  He had a platelet transfusion on 8/8.  She denies any bloody stools, hematuria, epistaxis.  Patient transferred here by primary care to rule out DKA given his hyperglycemia that was not able to be read on their monitors today.  Patient reports ongoing diarrhea that has been persistent since diagnosed with cirrhosis.  Reports some lower extremity edema that him and his daughter state have been present since he was diagnosed with cirrhosis but not worsening.  Nuys any associated fevers or chills.  Pt denies any fever, chill, ha,  lightheadedness, dizziness, congestion,  neck pain, cp, sob, cough,  urinary symptoms, change in bowel habits, melena, hematochezia, lower extremity paresthesias.   Past Medical History:  Diagnosis Date  . Cirrhosis ([place])   . Hepatitis B   . Lymphocytic colitis   . 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     Patient Active Problem List   Diagnosis Date Noted  . Pancytopenia ([place]) 12/03/2017  . Hyperglycemia 12/03/2017  . Cramps, extremity 12/03/2017  . Abnormal echocardiogram 12/03/2017  . New onset type 2 diabetes mellitus ([place]) 12/03/2017  . History of hepatitis B 12/03/2017  . Diarrhea   . Esophageal varices without bleeding ([place])   . Heart murmur 08/01/2017  . Hepatitis B 02/17/2015  . Elevated bilirubin   . Liver cirrhosis secondary to NASH (nonalcoholic steatohepatitis) ([place]) 11/25/2014  . Thrombocytopenia ([place]) 11/25/2014  . History of TB (tuberculosis) 07/16/2010    Past Surgical History:  Procedure Laterality Date  . BIOPSY  10/23/2017   Procedure: BIOPSY;  Surgeon: Milus Banister, MD;  Location: [facility];  Service: Endoscopy;;  . COLONOSCOPY WITH PROPOFOL N/A 10/23/2017   Procedure: COLONOSCOPY WITH PROPOFOL;  Surgeon: Milus Banister, MD;  Location: [facility];  Service: Endoscopy;  Laterality: N/A;  . COMPLEX WOUND CLOSURE Right 12/15/2015   Procedure: COMPLEX WOUND CLOSURE;  Surgeon: Leanora Cover, MD;  Location: [facility];  Service: Orthopedics;  Laterality: Right;  . ESOPHAGOGASTRODUODENOSCOPY (EGD) WITH PROPOFOL N/A 10/23/2017   Procedure: ESOPHAGOGASTRODUODENOSCOPY (EGD) WITH PROPOFOL;  Surgeon: Milus Banister, MD;  Location: [facility];  Service: Endoscopy;  Laterality: N/A;  . I&D EXTREMITY Right 12/15/2015   Procedure: IRRIGATION AND DEBRIDEMENT AND REVISION AMPUTATION RIGHT RING FINGER;  Surgeon: Leanora Cover, MD;  Location: [facility];  Service: Orthopedics;  Laterality: Right;        Home Medications    Prior to Admission medications   Medication Sig Start Date End Date  Taking? Authorizing Provider  Alogliptin Benzoate 12.5 MG TABS Take 12.5 mg by mouth daily. Patient not taking: Reported on 12/04/2017 12/03/17   Horald Pollen, MD  budesonide (ENTOCORT EC) 3 MG 24 hr capsule Take 3 capsules (9 mg total) by mouth daily. 10/30/17   Milus Banister, MD  furosemide (LASIX) 40 MG tablet Take 1 tablet (40 mg total) by mouth daily. 11/06/17 02/04/18  Milus Banister, MD  levocetirizine (XYZAL) 5 MG tablet Take 1 tablet (5 mg total) by mouth every evening. 07/11/17   Tereasa Coop, PA-C  metFORMIN (GLUCOPHAGE) 500 MG tablet Take 1 tablet (500 mg total) by mouth 2 (two) times daily with a meal. 12/03/17   Sagardia, Ines Bloomer, MD  nadolol (CORGARD) 20 MG tablet Take 1 tablet (20 mg total) by mouth daily. Patient not taking: Reported on 12/03/2017 10/23/17   Milus Banister, MD  omeprazole (PRILOSEC) 20 MG capsule Take 1 capsule (20 mg total) by mouth daily. 07/23/17   Horton, Barbette Hair, MD  omeprazole (PRILOSEC) 20 MG capsule Take 1 capsule (20 mg total) by mouth daily. 09/17/17   Milus Banister, MD  polyethylene glycol-electrolytes (NULYTELY/GOLYTELY) 420 g solution Take 4,000 mLs by mouth as directed. 09/18/17   Milus Banister, MD  polyethylene glycol-electrolytes (NULYTELY/GOLYTELY) 420 g solution Take 4,000 mLs by mouth as directed. 10/16/17   Milus Banister, MD  spironolactone (ALDACTONE) 100 MG tablet Take 1 tablet (100 mg total) by mouth daily. 11/06/17 02/04/18  Milus Banister, MD  tenofovir (VIREAD) 300 MG tablet Take 300 mg by mouth daily.    [provider]    Family History No family history on file.  Social History Social History   Tobacco Use  . Smoking status: Former Smoker    Packs/day: 0.50    Years: 8.00    Pack years: 4.00    Types: Cigarettes    Last attempt to quit: 04/08/1990    Years since quitting: 27.6  . Smokeless tobacco: Never Used  Substance Use Topics  . Alcohol use: No  . Drug use: No     Allergies     Patient has no known allergies.   Review of Systems Review of Systems  All other systems reviewed and are negative.    Physical Exam Updated Vital Signs BP (!) 96/56   Pulse (!) 55   Temp 98.5 F (36.9 C) (Oral)   Resp 14   SpO2 98%   Physical Exam  Constitutional: He is oriented to person, place, and time. He appears well-developed and well-nourished.  Non-toxic appearance. No distress.  HENT:  Head: Normocephalic and atraumatic.  Mouth/Throat: Oropharynx is clear and moist.  Eyes: Pupils are equal, round, and reactive to light. Conjunctivae are normal. Right eye exhibits no discharge. Left eye exhibits no discharge.  Neck: Normal range of motion. Neck supple.  Cardiovascular: Normal rate, regular rhythm, normal heart sounds and intact distal pulses. Exam reveals no gallop and no friction rub.  No murmur heard. Pulmonary/Chest: Effort normal and breath sounds normal. No respiratory distress.  He exhibits no tenderness.  Abdominal: Soft. Normal appearance and bowel sounds are normal. He exhibits no distension. There is no tenderness. There is no rigidity, no rebound, no guarding, no CVA tenderness, no tenderness at McBurney's point and negative Murphy's sign.  Musculoskeletal: Normal range of motion. He exhibits no tenderness.  Lymphadenopathy:    He has no cervical adenopathy.  Neurological: He is alert and oriented to person, place, and time.  Skin: Skin is warm and dry. Capillary refill takes less than 2 seconds. No rash noted.  Psychiatric: His behavior is normal. Judgment and thought content normal.  Nursing note and vitals reviewed.    ED Treatments / Results  Labs (all labs ordered are listed, but only abnormal results are displayed) Labs Reviewed  CBC WITH DIFFERENTIAL/PLATELET - Abnormal; Notable for the following components:      Result Value   WBC 2.9 (*)    RBC 3.23 (*)    Hemoglobin 11.0 (*)    HCT 33.4 (*)    MCV 103.4 (*)    MCH 34.1 (*)    Platelets  24 (*)    Neutro Abs 1.5 (*)    All other components within normal limits  COMPREHENSIVE METABOLIC PANEL - Abnormal; Notable for the following components:   Sodium 131 (*)    Glucose, Bld 437 (*)    Calcium 8.2 (*)    Total Protein 5.9 (*)    Albumin 2.5 (*)    Total Bilirubin 2.0 (*)    All other components within normal limits  URINALYSIS, ROUTINE W REFLEX MICROSCOPIC - Abnormal; Notable for the following components:   Glucose, UA >=500 (*)    Hgb urine dipstick SMALL (*)    All other components within normal limits  CBG MONITORING, ED - Abnormal; Notable for the following components:   Glucose-Capillary 411 (*)    All other components within normal limits  CBG MONITORING, ED - Abnormal; Notable for the following components:   Glucose-Capillary 475 (*)    All other components within normal limits  CBG MONITORING, ED - Abnormal; Notable for the following components:   Glucose-Capillary 408 (*)    All other components within normal limits  I-STAT TROPONIN, ED    EKG None ED ECG REPORT   Date: 12/05/2017  Rate: 52  Rhythm: normal sinus rhythm  QRS Axis: normal  Intervals: normal  ST/T Wave abnormalities: normal  Conduction Disutrbances:none  Narrative Interpretation:   Old EKG Reviewed: none available  I have personally reviewed the EKG tracing and agree with the computerized printout as noted.  Radiology No results found.  Procedures Procedures (including critical care time)  Medications Ordered in ED Medications  sodium chloride 0.9 % bolus 1,000 mL (0 mLs Intravenous Stopped 12/05/17 0047)  sodium chloride 0.9 % bolus 500 mL (0 mLs Intravenous Stopped 12/05/17 0148)  metFORMIN (GLUCOPHAGE) tablet 500 mg (500 mg Oral Given 12/05/17 0057)  insulin aspart (novoLOG) injection 4 Units (4 Units Subcutaneous Given 12/05/17 0148)     Initial Impression / Assessment and Plan / ED Course  I have reviewed the triage vital signs and the nursing notes.  Pertinent labs &  imaging results that were available during my care of the patient were reviewed by me and considered in my medical decision making (see chart for details).     Patient resents to the ED after being referred by primary care doctor for new onset hyperglycemia and diabetes.  Patient does have a history of pancytopenia secondary to liver  cirrhosis from hepatitis B.  This is followed by outpatient especially.  Patient reports some generalized abdominal pain that has been ongoing since being diagnosed with liver cirrhosis but has not acutely worsened.  Reports intermittent nausea without any significant vomiting.  Patient also reports some intermittent blurry vision when he is hungry but denies any vision changes at this time.  His vital signs are reassuring in the emergency department today.  She is afebrile.  No tachycardia noted.  On exam patient has no focal abdominal tenderness.  Bedside ultrasound was performed that showed no significant abdominal ascites.  Bowel sounds present in all 4 quadrants.  Neurovascularly intact in all extremities.  Lab work shows pancytopenia consistent with prior.  Platelets are 24 however this is patient's baseline.  Hyperglycemia noted at 437 with a corrected normal sodium.  No other significant electrolyte abnormalities.  Normal liver enzymes.  Mild decrease in protein.  Patient's bicarb and anion gap are normal.  Doubt lactic acidosis.  Troponin was negative.  EKG showed normal sinus rhythm.  UA showed glucose but no significant proteinuria.  No signs of infection.  Given the patient has no focal abdominal tenderness and has no significant ascites noted doubt SBP.  Patient given 1.5 L of fluid along with small amount of subcu insulin.  Blood sugar has improved from 4 87-4 04.  Patient given his home dose of metformin.  He is prescribed metformin by his primary care doctor but he has not got this medication filled yet.  Patient does have follow-up with his primary care doctor this  morning.  Encouraged him to follow-up at his scheduled appointment.  There is no indication for advanced imaging at this time.  No signs of DKA.  Pt is hemodynamically stable, in NAD, & able to ambulate in the ED. Evaluation does not show pathology that would require ongoing emergent intervention or inpatient treatment. I explained the diagnosis to the patient. Pain has been managed & has no complaints prior to dc. Pt is comfortable with above plan and is stable for discharge at this time. All questions were answered prior to disposition. Strict return precautions for f/u to the ED were discussed. Encouraged follow up with PCP.  Pt seen and eval by my attending who is agreeable with the above plan.    Final Clinical Impressions(s) / ED Diagnoses   Final diagnoses:  Hyperglycemia    ED Discharge Orders    None       Aaron Edelman 12/05/17 0257    Fatima Blank, MD 12/05/17 [phone]

## 2017-12-06 ENCOUNTER — Encounter: Payer: Self-pay | Admitting: Radiology

## 2017-12-09 ENCOUNTER — Telehealth: Payer: Self-pay | Admitting: Emergency Medicine

## 2017-12-09 NOTE — Telephone Encounter (Signed)
Where's this paperwork?

## 2017-12-09 NOTE — Telephone Encounter (Signed)
Patient's  insurance plan does not cover Alogliptin 12.5 tablets. Paperwork is in the provider box at the nurse's station

## 2017-12-09 NOTE — Telephone Encounter (Signed)
error 

## 2017-12-16 NOTE — Telephone Encounter (Signed)
°  Dr. Mitchel Honour, I found the paperwork and placed it in your box.  Thank you!

## 2017-12-19 ENCOUNTER — Other Ambulatory Visit (INDEPENDENT_AMBULATORY_CARE_PROVIDER_SITE_OTHER): Payer: PRIVATE HEALTH INSURANCE

## 2017-12-19 DIAGNOSIS — K7460 Unspecified cirrhosis of liver: Secondary | ICD-10-CM

## 2017-12-19 LAB — BASIC METABOLIC PANEL
BUN: 30 mg/dL — AB (ref 6–23)
CO2: 30 meq/L (ref 19–32)
CREATININE: 1.33 mg/dL (ref 0.40–1.50)
Calcium: 8.7 mg/dL (ref 8.4–10.5)
Chloride: 91 mEq/L — ABNORMAL LOW (ref 96–112)
GFR: 60.57 mL/min (ref >60.00)
Glucose, Bld: 485 mg/dL — ABNORMAL HIGH (ref 70–99)
POTASSIUM: 5.7 meq/L — AB (ref 3.5–5.1)
Sodium: 127 mEq/L — ABNORMAL LOW (ref 135–145)

## 2017-12-19 NOTE — Telephone Encounter (Signed)
Dr. Mitchel Honour, I wanted to check in on the Alogliptin 12.5 Tablets that were denied by insurance. We have received another request for it.  Thank you!

## 2017-12-22 ENCOUNTER — Telehealth: Payer: Self-pay | Admitting: Gastroenterology

## 2017-12-22 NOTE — Telephone Encounter (Signed)
Dr Ardis Hughs the pt has an appt tomorrow with Nevin Bloodgood and also his interpreter called and states he has labs Friday but did not stop budesonide. I don't believe that will interfere with the labs just an FYI Ms. Rosana Hoes was concerned.

## 2017-12-22 NOTE — Telephone Encounter (Signed)
Left message on machine to call back  

## 2017-12-23 ENCOUNTER — Other Ambulatory Visit: Payer: Self-pay | Admitting: Emergency Medicine

## 2017-12-23 ENCOUNTER — Other Ambulatory Visit (INDEPENDENT_AMBULATORY_CARE_PROVIDER_SITE_OTHER): Payer: PRIVATE HEALTH INSURANCE

## 2017-12-23 ENCOUNTER — Ambulatory Visit (INDEPENDENT_AMBULATORY_CARE_PROVIDER_SITE_OTHER): Payer: PRIVATE HEALTH INSURANCE | Admitting: Physician Assistant

## 2017-12-23 ENCOUNTER — Encounter: Payer: Self-pay | Admitting: Physician Assistant

## 2017-12-23 ENCOUNTER — Telehealth: Payer: Self-pay

## 2017-12-23 VITALS — BP 98/60 | HR 60 | Ht 62.25 in | Wt 108.1 lb

## 2017-12-23 DIAGNOSIS — R531 Weakness: Secondary | ICD-10-CM

## 2017-12-23 DIAGNOSIS — R1013 Epigastric pain: Secondary | ICD-10-CM

## 2017-12-23 DIAGNOSIS — K7290 Hepatic failure, unspecified without coma: Secondary | ICD-10-CM | POA: Diagnosis not present

## 2017-12-23 DIAGNOSIS — K5900 Constipation, unspecified: Secondary | ICD-10-CM

## 2017-12-23 DIAGNOSIS — K7460 Unspecified cirrhosis of liver: Secondary | ICD-10-CM

## 2017-12-23 LAB — COMPREHENSIVE METABOLIC PANEL
ALT: 58 U/L — ABNORMAL HIGH (ref 0–53)
AST: 37 U/L (ref 0–37)
Albumin: 3.1 g/dL — ABNORMAL LOW (ref 3.5–5.2)
Alkaline Phosphatase: 105 U/L (ref 39–117)
BUN: 26 mg/dL — AB (ref 6–23)
CHLORIDE: 93 meq/L — AB (ref 96–112)
CO2: 30 meq/L (ref 19–32)
Calcium: 8.9 mg/dL (ref 8.4–10.5)
Creatinine, Ser: 1.1 mg/dL (ref 0.40–1.50)
GFR: 75.4 mL/min (ref >60.00)
GLUCOSE: 397 mg/dL — AB (ref 70–99)
Potassium: 5.6 mEq/L — ABNORMAL HIGH (ref 3.5–5.1)
SODIUM: 127 meq/L — AB (ref 135–145)
Total Bilirubin: 4 mg/dL — ABNORMAL HIGH (ref 0.2–1.2)
Total Protein: 7.1 g/dL (ref 6.0–8.3)

## 2017-12-23 LAB — CBC WITH DIFFERENTIAL/PLATELET
BASOS ABS: 0 10*3/uL (ref 0.0–0.1)
Basophils Relative: 0.9 % (ref 0.0–3.0)
Eosinophils Absolute: 0.1 10*3/uL (ref 0.0–0.7)
Eosinophils Relative: 2.5 % (ref 0.0–5.0)
HCT: 43.2 % (ref 39.0–52.0)
Hemoglobin: 14.9 g/dL (ref 13.0–17.0)
Lymphocytes Relative: 26.6 % (ref 12.0–46.0)
Lymphs Abs: 1 10*3/uL (ref 0.7–4.0)
MCHC: 34.5 g/dL (ref 30.0–36.0)
MCV: 100.4 fl — ABNORMAL HIGH (ref 78.0–100.0)
MONO ABS: 0.4 10*3/uL (ref 0.1–1.0)
Monocytes Relative: 9.7 % (ref 3.0–12.0)
NEUTROS PCT: 60.3 % (ref 43.0–77.0)
Neutro Abs: 2.3 10*3/uL (ref 1.4–7.7)
RBC: 4.3 Mil/uL (ref 4.22–5.81)
RDW: 15 % (ref 11.5–15.5)
WBC: 3.9 10*3/uL — ABNORMAL LOW (ref 4.0–10.5)

## 2017-12-23 LAB — PROTIME-INR
INR: 1.4 ratio — AB (ref 0.8–1.0)
Prothrombin Time: 15.7 s — ABNORMAL HIGH (ref 9.6–13.1)

## 2017-12-23 LAB — AMMONIA: Ammonia: 47 umol/L — ABNORMAL HIGH (ref 11–35)

## 2017-12-23 MED ORDER — SITAGLIPTIN PHOSPHATE 25 MG PO TABS: 25.0000 mg | ORAL_TABLET | Freq: Every day | ORAL | 5 refills | Status: DC

## 2017-12-23 MED ORDER — LACTULOSE 10 GM/15ML PO SOLN: 30.0000 g | Freq: Every day | ORAL | 3 refills | Status: DC

## 2017-12-23 MED ORDER — SUCRALFATE 1 GM/10ML PO SUSP: 1.0000 g | Freq: Four times a day (QID) | ORAL | 3 refills | Status: DC

## 2017-12-23 NOTE — Telephone Encounter (Signed)
I was I thought quite clear that he needed to stop the budesonide at his office visit 3 weeks ago.  It is unfortunate that he did not stop it.  We also never heard back from him about his nadolol/Corgard usage.  He is seeing Nevin Bloodgood in the office today.  Can you please call him before then to try to ensure that he has a very accurate medication list of what he has been taking recently, it is difficult to care for him without at least knowing we have a reliable medicine list from him.

## 2017-12-23 NOTE — Telephone Encounter (Signed)
Lab called with critical lab value, platelets 24,000.

## 2017-12-23 NOTE — Telephone Encounter (Signed)
Left message on machine to call back, the pt has an appt this morning I left a message that a complete list of meds need to be with the pt the office visit.

## 2017-12-23 NOTE — Progress Notes (Signed)
Subjective:    Patient ID: Jaden Abreu, male    DOB: 07-26-1970, 47 y.o.   MRN: [id number]  HPI Elpidio Eric is a 47 year old [place]   Male,, non-English-speaking established with [name] He comes in today for follow-up after recent office visit about 2 weeks ago with [name].  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  He is on tenofovir as of July 2019 and is now established with Dr.Zamore Transplant work-up in progress.  Most recent meld calculated at 60 in July 2019. Is up-to-date with [place] screening with recent CT and AFP of 13.7.  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  Now on nadolol 20 mg daily.  She also has history of lymphocytic colitis documented on colonoscopy after complaints of chronic diarrhea He had been started on budesonide at 9 mg p.o. daily. At the time of his last office visit but apparently this did not seem to be making much difference.  He was told to stop the budesonide at the last office visit.  There was also some confusion about his other medications and what he was actually taking.  His daughter gives most of the history today aided by the interpreter.  He is complaining of significant fatigue and weakness, and his  daughter does not feel like he will be able to continue working.  She asked a lot of questions about obtaining Medicaid and disability. They did bring all of his medicines today appears he was taking 3 mg of budesonide up until about 3 days ago at which time he stopped.  He has been taking nadolol 20 mg daily, Lasix 40 once daily Aldactone 100 mg daily, tenofovir 300 mg daily and omeprazole 40 mg daily.  Is no current complaints of diarrhea and his daughter says actually has been constipated and unable to have a bowel movement over the past 1 week.  Apparently when he does have bowel movements he does not feel that he passes much stool, there is been no bleeding.  Appetite is fair.  He also complains of  epigastric discomfort fairly persistent and described as burning aching and sometimes throbbing in nature.  No  vomiting, does have occasional nausea.    Most recent labs on 12/19/2017 showed a glucose of 435 potassium 5.7 sodium 127 BUN 30 and creatinine 1.3.  Patient had been started on Glucophage by his PCP which she has never actually started as at the time of last office visit he was told to hold off on this until he went off of budesonide to see if that was what was driving the hyperglycemia.  He just stopped the budesonide 3 mg a couple of days ago.  Review of Systems Pertinent positive and negative review of systems were noted in the above HPI section.  All other review of systems was otherwise negative.  Outpatient Encounter Medications as of 12/23/2017  Medication Sig  . baclofen (LIORESAL) 10 MG tablet TK 1/2 TO 1 T PO Q 8 H PRF MUSCLE SPASMS  . furosemide (LASIX) 40 MG tablet Take 1 tablet (40 mg total) by mouth daily.  Marland Kitchen levocetirizine (XYZAL) 5 MG tablet Take 1 tablet (5 mg total) by mouth every evening.  . loperamide (IMODIUM A-D) 2 MG tablet Take 2 mg by mouth as needed for diarrhea or loose stools.  Marland Kitchen omeprazole (PRILOSEC) 40 MG capsule Take 1 capsule (40 mg total) by mouth daily.  Marland Kitchen spironolactone (ALDACTONE) 100 MG tablet Take 1 tablet (100  mg total) by mouth daily.  Marland Kitchen tenofovir (VIREAD) 300 MG tablet Take 300 mg by mouth daily.  Marland Kitchen lactulose (CHRONULAC) 10 GM/15ML solution Take 45 mLs (30 g total) by mouth daily.  . nadolol (CORGARD) 20 MG tablet Take 1 tablet (20 mg total) by mouth daily. (Patient not taking: Reported on 12/03/2017)  . sucralfate (CARAFATE) 1 GM/10ML suspension Take 10 mLs (1 g total) by mouth 4 (four) times daily. Take between meals and at bedtime.  . [DISCONTINUED] Alogliptin Benzoate 12.5 MG TABS Take 12.5 mg by mouth daily. (Patient not taking: Reported on 12/04/2017)  . [DISCONTINUED] loperamide (IMODIUM) 2 MG capsule Take 2 mg by mouth daily.  .  [DISCONTINUED] metFORMIN (GLUCOPHAGE) 500 MG tablet Take 1 tablet (500 mg total) by mouth 2 (two) times daily with a meal. (Patient not taking: Reported on 12/05/2017)  . [DISCONTINUED] omeprazole (PRILOSEC) 20 MG capsule Take 1 capsule (20 mg total) by mouth daily.  . [DISCONTINUED] omeprazole (PRILOSEC) 20 MG capsule Take 1 capsule (20 mg total) by mouth daily.   No facility-administered encounter medications on file as of 12/23/2017.    No Known Allergies Patient Active Problem List   Diagnosis Date Noted  . Pancytopenia ([place]) 12/03/2017  . Hyperglycemia 12/03/2017  . Cramps, extremity 12/03/2017  . Abnormal echocardiogram 12/03/2017  . New onset type 2 diabetes mellitus ([place]) 12/03/2017  . History of hepatitis B 12/03/2017  . Diarrhea   . Esophageal varices without bleeding ([place])   . Heart murmur 08/01/2017  . Hepatitis B 02/17/2015  . Elevated bilirubin   . Liver cirrhosis secondary to NASH (nonalcoholic steatohepatitis) ([place]) 11/25/2014  . Thrombocytopenia ([place]) 11/25/2014  . History of TB (tuberculosis) 07/16/2010   Social History   Socioeconomic History  . Marital status: Married    Spouse name: Not on file  . Number of children: 4  . Years of education: Not on file  . Highest education level: Not on file  Occupational History  . 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 Financial resource strain: Not on file  . Food insecurity:    Worry: Not on file    Inability: Not on file  . Transportation needs:    Medical: Not on file    Non-medical: Not on file  Tobacco Use  . Smoking status: Former Smoker    Packs/day: 0.50    Years: 8.00    Pack years: 4.00    Types: Cigarettes    Last attempt to quit: 04/08/1990    Years since quitting: 27.7  . Smokeless tobacco: Never Used  Substance and Sexual Activity  . Alcohol use: No  . Drug use: No  . Sexual activity: Yes  Lifestyle  . Physical activity:    Days per week: Not on file    Minutes  per session: Not on file  . Stress: Not on file  Relationships  . Social connections:    Talks on phone: Not on file    Gets together: Not on file    Attends religious service: Not on file    Active member of club or organization: Not on file    Attends meetings of clubs or organizations: Not on file    Relationship status: Not on file  . Intimate partner violence:    Fear of current or ex partner: Not on file    Emotionally abused: Not on file    Physically abused: Not on file    Forced sexual  activity: Not on file  Other Topics Concern  . Not on file  Social History Narrative  . Not on file    Mr. Loewen family history is not on file.      Objective:    Vitals:   12/23/17 0950  BP: 98/60  Pulse: 60    Physical Exam; well-developed older Asian male chronically ill-appearing, thin no acute distress, he is accompanied by his daughter and an interpreter blood pressure 98/60 weight 108 BMI 19.6 height 5 foot 2 pulse 60.  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  He does not feel that he will be able to continue working and are looking into applying for [facility] Medicare for him and possibly disability.    I directed them to his PCP for further advice I do think  he would qualify for disability from a cirrhotic standpoint.  #2 significant hyperglycemia with most recent nonfasting glucose 435-certainly this is contributing to his symptoms. He has discontinued budesonide and will stay off of that.  #3 lymphocytic colitis currently asymptomatic and actually significantly constipated #4 epigastric pain -etiology not clear but suspect may be due to portal gastropathy.  Plan; Continue omeprazole 40 mg p.o. every morning And Carafate liquid 1 g between meals and at bedtime For constipation will start lactulose 30 cc p.o. once daily, daughter was advised that if this was ineffective to increase to twice daily. For now continue Lasix 40 mg p.o. every morning and Aldactone 100 mg p.o. every morning He continues on tenofovir Desonide has been discontinued Advised to start metformin as prescribed per his PCP today. Will check C met, CBC and INR today Patient will keep follow-up appointment with Dr. Lurlean Leyden  in October. We will arrange for follow-up in our office in 3 to 4 weeks. I had an extended conversation with the patient and his daughter today with the insistence of the interpreter.  I made it very clear that medicines that he is taking are to help with complications of his cirrhosis, and to help with dyspepsia.  He knows he is on treatment for hepatitis B. I made it very clear that he has a diagnosis of end-stage cirrhosis that will not ever improve or resolve and that he will continue most likely to feel very fatigued and weak.  Liver transplantation will be his best opportunity. Do think he would qualify for disability, and encouraged them to pursue obtaining Medicare/Medicaid and perhaps disability. He also has follow-up with his PCP last week and needs attention to his newly diagnosed diabetes.  Amy S Esterwood PA-C 12/23/2017   Cc: Tereasa Coop, PA-C

## 2017-12-23 NOTE — Progress Notes (Signed)
I agree with the above note, plan 

## 2017-12-23 NOTE — Patient Instructions (Addendum)
If you are age 49 or older, your body mass index should be between 23-30. Your Body mass index is 19.62 kg/m. If this is out of the aforementioned range listed, please consider follow up with your Primary Care Provider.  If you are age 19 or younger, your body mass index should be between 19-25. Your Body mass index is 19.62 kg/m. If this is out of the aformentioned range listed, please consider follow up with your Primary Care Provider.   Your provider has requested that you go to the basement level for lab work before leaving today. Press "B" on the elevator. The lab is located at the first door on the left as you exit the elevator.  We have sent the following medications to your pharmacy for you to pick up at your convenience: Carafate Lactulose  Continue current medications.  Start Metformin.  Start Diabetic Diet.  Start Low Sodium Diet.  Follow up with Tye Savoy, NP on  January 20, 2018 at 8:30 am.  Thank you for choosing me and [facility].   Amy Esterwood, PA-C  Diabetes Mellitus and Nutrition When you have diabetes (diabetes mellitus), it is very important to have healthy eating habits because your blood sugar (glucose) levels are greatly affected by what you eat and drink. Eating healthy foods in the appropriate amounts, at about the same times every day, can help you:  Control your blood glucose.  Lower your risk of heart disease.  Improve your blood pressure.  Reach or maintain a healthy weight.  Every person with diabetes is different, and each person has different needs for a meal plan. Your health care provider may recommend that you work with a diet and nutrition specialist (dietitian) to make a meal plan that is best for you. Your meal plan may vary depending on factors such as:  The calories you need.  The medicines you take.  Your weight.  Your blood glucose, blood pressure, and cholesterol levels.  Your activity level.  Other  health conditions you have, such as heart or kidney disease.  How do carbohydrates affect me? Carbohydrates affect your blood glucose level more than any other type of food. Eating carbohydrates naturally increases the amount of glucose in your blood. Carbohydrate counting is a method for keeping track of how many carbohydrates you eat. Counting carbohydrates is important to keep your blood glucose at a healthy level, especially if you use insulin or take certain oral diabetes medicines. It is important to know how many carbohydrates you can safely have in each meal. This is different for every person. Your dietitian can help you calculate how many carbohydrates you should have at each meal and for snack. Foods that contain carbohydrates include:  Bread, cereal, rice, pasta, and crackers.  Potatoes and corn.  Peas, beans, and lentils.  Milk and yogurt.  Fruit and juice.  Desserts, such as cakes, cookies, ice cream, and candy.  How does alcohol affect me? Alcohol can cause a sudden decrease in blood glucose (hypoglycemia), especially if you use insulin or take certain oral diabetes medicines. Hypoglycemia can be a life-threatening condition. Symptoms of hypoglycemia (sleepiness, dizziness, and confusion) are similar to symptoms of having too much alcohol. If your health care provider says that alcohol is safe for you, follow these guidelines:  Limit alcohol intake to no more than 1 drink per day for nonpregnant women and 2 drinks per day for men. One drink equals 12 oz of beer, 5 oz of wine, or 1 oz  of hard liquor.  Do not drink on an empty stomach.  Keep yourself hydrated with water, diet soda, or unsweetened iced tea.  Keep in mind that regular soda, juice, and other mixers may contain a lot of sugar and must be counted as carbohydrates.  What are tips for following this plan? Reading food labels  Start by checking the serving size on the label. The amount of calories,  carbohydrates, fats, and other nutrients listed on the label are based on one serving of the food. Many foods contain more than one serving per package.  Check the total grams (g) of carbohydrates in one serving. You can calculate the number of servings of carbohydrates in one serving by dividing the total carbohydrates by 15. For example, if a food has 30 g of total carbohydrates, it would be equal to 2 servings of carbohydrates.  Check the number of grams (g) of saturated and trans fats in one serving. Choose foods that have low or no amount of these fats.  Check the number of milligrams (mg) of sodium in one serving. Most people should limit total sodium intake to less than 2,300 mg per day.  Always check the nutrition information of foods labeled as "low-fat" or "nonfat". These foods may be higher in added sugar or refined carbohydrates and should be avoided.  Talk to your dietitian to identify your daily goals for nutrients listed on the label. Shopping  Avoid buying canned, premade, or processed foods. These foods tend to be high in fat, sodium, and added sugar.  Shop around the outside edge of the grocery store. This includes fresh fruits and vegetables, bulk grains, fresh meats, and fresh dairy. Cooking  Use low-heat cooking methods, such as baking, instead of high-heat cooking methods like deep frying.  Cook using healthy oils, such as olive, canola, or sunflower oil.  Avoid cooking with butter, cream, or high-fat meats. Meal planning  Eat meals and snacks regularly, preferably at the same times every day. Avoid going long periods of time without eating.  Eat foods high in fiber, such as fresh fruits, vegetables, beans, and whole grains. Talk to your dietitian about how many servings of carbohydrates you can eat at each meal.  Eat 4-6 ounces of lean protein each day, such as lean meat, chicken, fish, eggs, or tofu. 1 ounce is equal to 1 ounce of meat, chicken, or fish, 1 egg, or  1/4 cup of tofu.  Eat some foods each day that contain healthy fats, such as avocado, nuts, seeds, and fish. Lifestyle   Check your blood glucose regularly.  Exercise at least 30 minutes 5 or more days each week, or as told by your health care provider.  Take medicines as told by your health care provider.  Do not use any products that contain nicotine or tobacco, such as cigarettes and e-cigarettes. If you need help quitting, ask your health care provider.  Work with a Social worker or diabetes educator to identify strategies to manage stress and any emotional and social challenges. What are some questions to ask my health care provider?  Do I need to meet with a diabetes educator?  Do I need to meet with a dietitian?  What number can I call if I have questions?  When are the best times to check my blood glucose? Where to find more information:  American Diabetes Association: diabetes.org/food-and-fitness/food  Academy of Nutrition and Dietetics: PokerClues.dk  [employer] of Diabetes and Digestive and Kidney Diseases (NIH): ContactWire.be Summary  A healthy meal plan will help you control your blood glucose and maintain a healthy lifestyle.  Working with a diet and nutrition specialist (dietitian) can help you make a meal plan that is best for you.  Keep in mind that carbohydrates and alcohol have immediate effects on your blood glucose levels. It is important to count carbohydrates and to use alcohol carefully. This information is not intended to replace advice given to you by your health care provider. Make sure you discuss any questions you have with your health care provider. Document Released: 12/20/2004 Document Revised: 04/29/2016 Document Reviewed: 04/29/2016 Elsevier Interactive Patient Education  2018 [employer].  Low-Sodium Eating  Plan Sodium, which is an element that makes up salt, helps you maintain a healthy balance of fluids in your body. Too much sodium can increase your blood pressure and cause fluid and waste to be held in your body. Your health care provider or dietitian may recommend following this plan if you have high blood pressure (hypertension), kidney disease, liver disease, or heart failure. Eating less sodium can help lower your blood pressure, reduce swelling, and protect your heart, liver, and kidneys. What are tips for following this plan? General guidelines  Most people on this plan should limit their sodium intake to 1,500-2,000 mg (milligrams) of sodium each day. Reading food labels  The Nutrition Facts label lists the amount of sodium in one serving of the food. If you eat more than one serving, you must multiply the listed amount of sodium by the number of servings.  Choose foods with less than 140 mg of sodium per serving.  Avoid foods with 300 mg of sodium or more per serving. Shopping  Look for lower-sodium products, often labeled as "low-sodium" or "no salt added."  Always check the sodium content even if foods are labeled as "unsalted" or "no salt added".  Buy fresh foods. ? Avoid canned foods and premade or frozen meals. ? Avoid canned, cured, or processed meats  Buy breads that have less than 80 mg of sodium per slice. Cooking  Eat more home-cooked food and less restaurant, buffet, and fast food.  Avoid adding salt when cooking. Use salt-free seasonings or herbs instead of table salt or sea salt. Check with your health care provider or pharmacist before using salt substitutes.  Cook with plant-based oils, such as canola, sunflower, or olive oil. Meal planning  When eating at a restaurant, ask that your food be prepared with less salt or no salt, if possible.  Avoid foods that contain MSG (monosodium glutamate). MSG is sometimes added to [place] food, bouillon, and some canned  foods. What foods are recommended? The items listed may not be a complete list. Talk with your dietitian about what dietary choices are best for you. Grains Low-sodium cereals, including oats, puffed wheat and rice, and shredded wheat. Low-sodium crackers. Unsalted rice. Unsalted pasta. Low-sodium bread. Whole-grain breads and whole-grain pasta. Vegetables Fresh or frozen vegetables. "No salt added" canned vegetables. "No salt added" tomato sauce and paste. Low-sodium or reduced-sodium tomato and vegetable juice. Fruits Fresh, frozen, or canned fruit. Fruit juice. Meats and other protein foods Fresh or frozen (no salt added) meat, poultry, seafood, and fish. Low-sodium canned tuna and salmon. Unsalted nuts. Dried peas, beans, and lentils without added salt. Unsalted canned beans. Eggs. Unsalted nut butters. Dairy Milk. Soy milk. Cheese that is naturally low in sodium, such as ricotta cheese, fresh mozzarella, or Swiss cheese Low-sodium or reduced-sodium cheese. Cream cheese. Yogurt. Fats and oils  Unsalted butter. Unsalted margarine with no trans fat. Vegetable oils such as canola or olive oils. Seasonings and other foods Fresh and dried herbs and spices. Salt-free seasonings. Low-sodium mustard and ketchup. Sodium-free salad dressing. Sodium-free light mayonnaise. Fresh or refrigerated horseradish. Lemon juice. Vinegar. Homemade, reduced-sodium, or low-sodium soups. Unsalted popcorn and pretzels. Low-salt or salt-free chips. What foods are not recommended? The items listed may not be a complete list. Talk with your dietitian about what dietary choices are best for you. Grains Instant hot cereals. Bread stuffing, pancake, and biscuit mixes. Croutons. Seasoned rice or pasta mixes. Noodle soup cups. Boxed or frozen macaroni and cheese. Regular salted crackers. Self-rising flour. Vegetables Sauerkraut, pickled vegetables, and relishes. Olives. [place] fries. Onion rings. Regular canned vegetables  (not low-sodium or reduced-sodium). Regular canned tomato sauce and paste (not low-sodium or reduced-sodium). Regular tomato and vegetable juice (not low-sodium or reduced-sodium). Frozen vegetables in sauces. Meats and other protein foods Meat or fish that is salted, canned, smoked, spiced, or pickled. Bacon, ham, sausage, hotdogs, corned beef, chipped beef, packaged lunch meats, salt pork, jerky, pickled herring, anchovies, regular canned tuna, sardines, salted nuts. Dairy Processed cheese and cheese spreads. Cheese curds. Blue cheese. Feta cheese. String cheese. Regular cottage cheese. Buttermilk. Canned milk. Fats and oils Salted butter. Regular margarine. Ghee. Bacon fat. Seasonings and other foods Onion salt, garlic salt, seasoned salt, table salt, and sea salt. Canned and packaged gravies. Worcestershire sauce. Tartar sauce. Barbecue sauce. Teriyaki sauce. Soy sauce, including reduced-sodium. Steak sauce. Fish sauce. Oyster sauce. Cocktail sauce. Horseradish that you find on the shelf. Regular ketchup and mustard. Meat flavorings and tenderizers. Bouillon cubes. Hot sauce and Tabasco sauce. Premade or packaged marinades. Premade or packaged taco seasonings. Relishes. Regular salad dressings. Salsa. Potato and tortilla chips. Corn chips and puffs. Salted popcorn and pretzels. Canned or dried soups. Pizza. Frozen entrees and pot pies. Summary  Eating less sodium can help lower your blood pressure, reduce swelling, and protect your heart, liver, and kidneys.  Most people on this plan should limit their sodium intake to 1,500-2,000 mg (milligrams) of sodium each day.  Canned, boxed, and frozen foods are high in sodium. Restaurant foods, fast foods, and pizza are also very high in sodium. You also get sodium by adding salt to food.  Try to cook at home, eat more fresh fruits and vegetables, and eat less fast food, canned, processed, or prepared foods. This information is not intended to replace  advice given to you by your health care provider. Make sure you discuss any questions you have with your health care provider. Document Released: 09/14/2001 Document Revised: 03/18/2016 Document Reviewed: 03/18/2016 Elsevier Interactive Patient Education  [employer].

## 2017-12-24 NOTE — Telephone Encounter (Signed)
Noted - chronic for him

## 2017-12-25 NOTE — Progress Notes (Signed)
Cardiology Office Note   Date:  12/26/2017   ID:  Jose Snow, DOB 03/04/1971, MRN [id number]  PCP:  Horald Pollen, MD  Cardiologist:   No primary care provider on file. Referring:  Horald Pollen, MD  Chief Complaint  Patient presents with  . Abnormal Echo      History of Present Illness: Jose Snow is a 47 y.o. male who was referred by Horald Pollen, MD for evaluation of an abnormal echo.   Echo was done in June and demonstrated a normal EF with evidence of positive bubbles with a possible small PFO vs. Intrapulmonary shunt.  This was done to evaluate a heart murmur.  He has not had any prior cardiac problems.  He has severe problems related to hepatitis B.  He has had chronic abdominal pain.  He still works.  He is had severe fatigue but no reproducible cardiovascular symptoms.  He has not had any palpitations, presyncope or syncope.  There is no obvious shortness of breath, PND or orthopnea.  He has not had any new leg swelling.  The history is obtained through an interpreter.   Past Medical History:  Diagnosis Date  . Cirrhosis ([place])   . Diabetes mellitus ([place])   . Hepatitis B   . Lymphocytic colitis   . 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     Past Surgical History:  Procedure Laterality Date  . BIOPSY  10/23/2017   Procedure: BIOPSY;  Surgeon: Milus Banister, MD;  Location: [facility];  Service: Endoscopy;;  . COLONOSCOPY WITH PROPOFOL N/A 10/23/2017   Procedure: COLONOSCOPY WITH PROPOFOL;  Surgeon: Milus Banister, MD;  Location: [facility];  Service: Endoscopy;  Laterality: N/A;  . COMPLEX WOUND CLOSURE Right 12/15/2015   Procedure: COMPLEX WOUND CLOSURE;  Surgeon: Leanora Cover, MD;  Location: [place];  Service: Orthopedics;  Laterality: Right;  . ESOPHAGOGASTRODUODENOSCOPY (EGD) WITH PROPOFOL N/A 10/23/2017   Procedure: ESOPHAGOGASTRODUODENOSCOPY (EGD) WITH PROPOFOL;  Surgeon: Milus Banister, MD;  Location: [facility];  Service: Endoscopy;  Laterality: N/A;  . I&D EXTREMITY Right 12/15/2015   Procedure: IRRIGATION AND DEBRIDEMENT AND REVISION AMPUTATION RIGHT RING FINGER;  Surgeon: Leanora Cover, MD;  Location: [place];  Service: Orthopedics;  Laterality: Right;     Current Outpatient Medications  Medication Sig Dispense Refill  . baclofen (LIORESAL) 10 MG tablet TK 1/2 TO 1 T PO Q 8 H PRF MUSCLE SPASMS  0  . furosemide (LASIX) 40 MG tablet Take 1 tablet (40 mg total) by mouth daily. 90 tablet 3  . levocetirizine (XYZAL) 5 MG tablet Take 1 tablet (5 mg total) by mouth every evening. 30 tablet 0  . metFORMIN (GLUCOPHAGE) 500 MG tablet   3  . nadolol (CORGARD) 20 MG tablet Take 1 tablet (20 mg total) by mouth daily. 30 tablet 3  . omeprazole (PRILOSEC) 40 MG capsule Take 1 capsule (40 mg total) by mouth daily. 30 capsule 11  . sitaGLIPtin (JANUVIA) 25 MG tablet Take 1 tablet (25 mg total) by mouth daily. 30 tablet 5  . spironolactone (ALDACTONE) 100 MG tablet Take 1 tablet (100 mg total) by mouth daily. 90 tablet 3  . tenofovir (VIREAD) 300 MG tablet Take 300 mg by mouth daily.    Marland Kitchen lactulose (CHRONULAC) 10 GM/15ML solution Take 45 mLs (30 g total) by mouth daily. (Patient not taking: Reported on 12/26/2017) 240 mL 3  . sucralfate (CARAFATE) 1 GM/10ML suspension  Take 10 mLs (1 g total) by mouth 4 (four) times daily. Take between meals and at bedtime. (Patient not taking: Reported on 12/26/2017) 420 mL 3   No current facility-administered medications for this visit.     Allergies:   Patient has no known allergies.    Social History:  The patient  reports that he quit smoking about 27 years ago. His smoking use included cigarettes. He has a 4.00 pack-year smoking history. He has never used smokeless tobacco. He reports that he does not drink alcohol or use drugs.   Family History:  The patient's family history is not on file.    ROS:  Please see the history of present illness.   Otherwise, review of  systems are positive for none.   All other systems are reviewed and negative.    PHYSICAL EXAM: VS:  BP 100/60 (BP Location: Right Arm)   Pulse 92   Resp 10   Ht 5' 3"  (1.6 m)   Wt 112 lb 3.2 oz (50.9 kg)   BMI 19.88 kg/m  , BMI Body mass index is 19.88 kg/m. GENERAL: Chronically ill appearing and thin HEENT:  Pupils equal round and reactive, fundi not visualized, oral mucosa unremarkable NECK:  No jugular venous distention, waveform within normal limits, carotid upstroke brisk and symmetric, no bruits, no thyromegaly LYMPHATICS:  No cervical, inguinal adenopathy LUNGS:  Clear to auscultation bilaterally BACK:  No CVA tenderness CHEST:  Unremarkable HEART:  PMI not displaced or sustained,S1 and S2 within normal limits, no S3, no S4, no clicks, no rubs, no murmurs ABD:  Flat, positive bowel sounds normal in frequency in pitch, no bruits, no rebound, no guarding, no midline pulsatile mass, no hepatomegaly, no splenomegaly EXT:  2 plus pulses throughout, no edema, no cyanosis no clubbing SKIN:  No rashes no nodules NEURO:  Cranial nerves II through XII grossly intact, motor grossly intact throughout PSYCH:  Cognitively intact, oriented to person place and time    EKG:  EKG is not ordered today. The ekg ordered 8/29/19demonstrates    Recent Labs: 12/23/2017: ALT 58; BUN 26; Creatinine, Ser 1.10; Hemoglobin 14.9; Platelets 29.0 Repeated and verified X2.; Potassium 5.6; Sodium 127    Lipid Panel No results found for: CHOL, TRIG, HDL, CHOLHDL, VLDL, LDLCALC, LDLDIRECT    Wt Readings from Last 3 Encounters:  12/26/17 112 lb 3.2 oz (50.9 kg)  12/23/17 108 lb 2 oz (49 kg)  12/05/17 129 lb 12.8 oz (58.9 kg)      Other studies Reviewed: Additional studies/ records that were reviewed today include: Echo.  (I personally reviewed the images.) Review of the above records demonstrates:  Please see elsewhere in the note.     ASSESSMENT AND PLAN:  MURMUR:  There is a positive bubble  study as mentioned above.   I do not appreciate a murmur today.  There might be a very slight flow murmur because he is so very thin.  Ever, he is not having any symptoms related to a large PFO or shunting.  I went through the anatomy with the patient through the interpreter and I would not think at this point that any of his chronic symptoms are related or that further evaluation is indicated.  DM:   A1c is 7.3 per the  Horald Pollen, MD  Current medicines are reviewed at length with the patient today.  The patient does not have concerns regarding medicines.  The following changes have been made:  no change  Labs/  tests ordered today include: None No orders of the defined types were placed in this encounter.    Disposition:   FU with me as needed.      Signed, Minus Breeding, MD  12/26/2017 12:29 PM    [facility]

## 2017-12-26 ENCOUNTER — Encounter: Payer: Self-pay | Admitting: Cardiology

## 2017-12-26 ENCOUNTER — Ambulatory Visit: Payer: PRIVATE HEALTH INSURANCE | Admitting: Cardiology

## 2017-12-26 VITALS — BP 100/60 | HR 92 | Resp 10 | Ht 63.0 in | Wt 112.2 lb

## 2017-12-26 DIAGNOSIS — R931 Abnormal findings on diagnostic imaging of heart and coronary circulation: Secondary | ICD-10-CM

## 2017-12-26 NOTE — Patient Instructions (Signed)
Medication Instructions:  Continue current medications  If you need a refill on your cardiac medications before your next appointment, please call your pharmacy.  Labwork: None Ordered  Testing/Procedures: None Ordered  Follow-Up: Your physician wants you to follow-up in: As Needed.      Thank you for choosing [facility] at [facility]!!

## 2017-12-29 ENCOUNTER — Ambulatory Visit: Payer: No Typology Code available for payment source | Admitting: Emergency Medicine

## 2017-12-29 ENCOUNTER — Encounter: Payer: Self-pay | Admitting: Emergency Medicine

## 2017-12-29 ENCOUNTER — Other Ambulatory Visit: Payer: Self-pay

## 2017-12-29 VITALS — BP 94/50 | HR 74 | Temp 98.7°F | Resp 16 | Ht 63.0 in | Wt 109.0 lb

## 2017-12-29 DIAGNOSIS — H538 Other visual disturbances: Secondary | ICD-10-CM | POA: Diagnosis not present

## 2017-12-29 DIAGNOSIS — E1142 Type 2 diabetes mellitus with diabetic polyneuropathy: Secondary | ICD-10-CM

## 2017-12-29 DIAGNOSIS — K7581 Nonalcoholic steatohepatitis (NASH): Secondary | ICD-10-CM

## 2017-12-29 DIAGNOSIS — K7460 Unspecified cirrhosis of liver: Secondary | ICD-10-CM | POA: Diagnosis not present

## 2017-12-29 DIAGNOSIS — Z23 Encounter for immunization: Secondary | ICD-10-CM | POA: Diagnosis not present

## 2017-12-29 DIAGNOSIS — E119 Type 2 diabetes mellitus without complications: Secondary | ICD-10-CM

## 2017-12-29 LAB — POCT GLYCOSYLATED HEMOGLOBIN (HGB A1C): HEMOGLOBIN A1C: 9.9 % — AB (ref 4.0–5.6)

## 2017-12-29 LAB — GLUCOSE, POCT (MANUAL RESULT ENTRY): POC Glucose: >444 mg/dl (ref 70–99)

## 2017-12-29 NOTE — Patient Instructions (Addendum)
If you have lab work done today you will be contacted with your lab results within the next 2 weeks.  If you have not heard from [place] then please contact us. The fastest way to get your results is to register for My Chart.   IF you received an x-ray today, you will receive an invoice from [facility]. Please contact [facility] at [phone] with questions or concerns regarding your invoice.   IF you received labwork today, you will receive an invoice from [facility]. Please contact [facility] at [phone] with questions or concerns regarding your invoice.   Our billing staff will not be able to assist you with questions regarding bills from these companies.  You will be contacted with the lab results as soon as they are available. The fastest way to get your results is to activate your My Chart account. Instructions are located on the last page of this paperwork. If you have not heard from [place] regarding the results in 2 weeks, please contact this office.     Cirrhosis Cirrhosis is long-term (chronic) liver injury. The liver is your largest internal organ, and it performs many functions. The liver converts food into energy, removes toxic material from your blood, makes important proteins, and absorbs necessary vitamins from your diet. If you have cirrhosis, it means many of your healthy liver cells have been replaced by scar tissue. This prevents blood from flowing through your liver, which makes it difficult for your liver to function. This scarring is not reversible, but treatment can prevent it from getting worse. What are the causes? Hepatitis C and long-term alcohol abuse are the most common causes of cirrhosis. Other causes include:  Nonalcoholic fatty liver disease.  Hepatitis B infection.  Autoimmune hepatitis.  Diseases that cause blockage of ducts inside the liver.  Inherited liver diseases.  Reactions to certain long-term medicines.  Parasitic  infections.  Long-term exposure to certain toxins.  What increases the risk? You may have a higher risk of cirrhosis if you:  Have certain hepatitis viruses.  Abuse alcohol, especially if you are male.  Are overweight.  Share needles.  Have unprotected sex with someone who has hepatitis.  What are the signs or symptoms? You may not have any signs and symptoms at first. Symptoms may not develop until the damage to your liver starts to get worse. Signs and symptoms of cirrhosis may include:  Tenderness in the right-upper part of your abdomen.  Weakness and tiredness (fatigue).  Loss of appetite.  Nausea.  Weight loss and muscle loss.  Itchiness.  Yellow skin and eyes (jaundice).  Buildup of fluid in the abdomen (ascites).  Swelling of the feet and ankles (edema).  Appearance of tiny blood vessels under the skin.  Mental confusion.  Easy bruising and bleeding.  How is this diagnosed? Your health care provider may suspect cirrhosis based on your symptoms and medical history, especially if you have other medical conditions or a history of alcohol abuse. Your health care provider will do a physical exam to feel your liver and check for signs of cirrhosis. Your health care provider may perform other tests, including:  Blood tests to check: ? Whether you have hepatitis B or C. ? Kidney function. ? Liver function.  Imaging tests such as: ? MRI or CT scan to look for changes seen in advanced cirrhosis. ? Ultrasound to see if normal liver tissue is being replaced by scar tissue.  A procedure using a long needle to  take a sample of liver tissue (biopsy) for examination under a microscope. Liver biopsy can confirm the diagnosis of cirrhosis.  How is this treated? Treatment depends on how damaged your liver is and what caused the damage. Treatment may include treating cirrhosis symptoms or treating the underlying causes of the condition to try to slow the progression of  the damage. Treatment may include:  Making lifestyle changes, such as: ? Eating a healthy diet. ? Restricting salt intake. ? Maintaining a healthy weight. ? Not abusing drugs or alcohol.  Taking medicines to: ? Treat liver infections or other infections. ? Control itching. ? Reduce fluid buildup. ? Reduce certain blood toxins. ? Reduce risk of bleeding from enlarged blood vessels in the stomach or esophagus (varices).  If varices are causing bleeding problems, you may need treatment with a procedure that ties up the vessels causing them to fall off (band ligation).  If cirrhosis is causing your liver to fail, your health care provider may recommend a liver transplant.  Other treatments may be recommended depending on any complications of cirrhosis, such as liver-related kidney failure (hepatorenal syndrome).  Follow these instructions at home:  Take medicines only as directed by your health care provider. Do not use drugs that are toxic to your liver. Ask your health care provider before taking any new medicines, including over-the-counter medicines.  Rest as needed.  Eat a well-balanced diet. Ask your health care provider or dietitian for more information.  You may have to follow a low-salt diet or restrict your water intake as directed.  Do not drink alcohol. This is especially important if you are taking acetaminophen.  Keep all follow-up visits as directed by your health care provider. This is important. Contact a health care provider if:  You have fatigue or weakness that is getting worse.  You develop swelling of the hands, feet, legs, or face.  You have a fever.  You develop loss of appetite.  You have nausea or vomiting.  You develop jaundice.  You develop easy bruising or bleeding. Get help right away if:  You vomit bright red blood or a material that looks like coffee grounds.  You have blood in your stools.  Your stools appear black and tarry.  You  become confused.  You have chest pain or trouble breathing. This information is not intended to replace advice given to you by your health care provider. Make sure you discuss any questions you have with your health care provider. Document Released: 03/25/2005 Document Revised: 08/03/2015 Document Reviewed: 12/01/2013 Elsevier Interactive Patient Education  2018 [employer].  Diabetes Mellitus and Nutrition When you have diabetes (diabetes mellitus), it is very important to have healthy eating habits because your blood sugar (glucose) levels are greatly affected by what you eat and drink. Eating healthy foods in the appropriate amounts, at about the same times every day, can help you:  Control your blood glucose.  Lower your risk of heart disease.  Improve your blood pressure.  Reach or maintain a healthy weight.  Every person with diabetes is different, and each person has different needs for a meal plan. Your health care provider may recommend that you work with a diet and nutrition specialist (dietitian) to make a meal plan that is best for you. Your meal plan may vary depending on factors such as:  The calories you need.  The medicines you take.  Your weight.  Your blood glucose, blood pressure, and cholesterol levels.  Your activity level.  Other  health conditions you have, such as heart or kidney disease.  How do carbohydrates affect me? Carbohydrates affect your blood glucose level more than any other type of food. Eating carbohydrates naturally increases the amount of glucose in your blood. Carbohydrate counting is a method for keeping track of how many carbohydrates you eat. Counting carbohydrates is important to keep your blood glucose at a healthy level, especially if you use insulin or take certain oral diabetes medicines. It is important to know how many carbohydrates you can safely have in each meal. This is different for every person. Your dietitian can help you  calculate how many carbohydrates you should have at each meal and for snack. Foods that contain carbohydrates include:  Bread, cereal, rice, pasta, and crackers.  Potatoes and corn.  Peas, beans, and lentils.  Milk and yogurt.  Fruit and juice.  Desserts, such as cakes, cookies, ice cream, and candy.  How does alcohol affect me? Alcohol can cause a sudden decrease in blood glucose (hypoglycemia), especially if you use insulin or take certain oral diabetes medicines. Hypoglycemia can be a life-threatening condition. Symptoms of hypoglycemia (sleepiness, dizziness, and confusion) are similar to symptoms of having too much alcohol. If your health care provider says that alcohol is safe for you, follow these guidelines:  Limit alcohol intake to no more than 1 drink per day for nonpregnant women and 2 drinks per day for men. One drink equals 12 oz of beer, 5 oz of wine, or 1 oz of hard liquor.  Do not drink on an empty stomach.  Keep yourself hydrated with water, diet soda, or unsweetened iced tea.  Keep in mind that regular soda, juice, and other mixers may contain a lot of sugar and must be counted as carbohydrates.  What are tips for following this plan? Reading food labels  Start by checking the serving size on the label. The amount of calories, carbohydrates, fats, and other nutrients listed on the label are based on one serving of the food. Many foods contain more than one serving per package.  Check the total grams (g) of carbohydrates in one serving. You can calculate the number of servings of carbohydrates in one serving by dividing the total carbohydrates by 15. For example, if a food has 30 g of total carbohydrates, it would be equal to 2 servings of carbohydrates.  Check the number of grams (g) of saturated and trans fats in one serving. Choose foods that have low or no amount of these fats.  Check the number of milligrams (mg) of sodium in one serving. Most people should  limit total sodium intake to less than 2,300 mg per day.  Always check the nutrition information of foods labeled as "low-fat" or "nonfat". These foods may be higher in added sugar or refined carbohydrates and should be avoided.  Talk to your dietitian to identify your daily goals for nutrients listed on the label. Shopping  Avoid buying canned, premade, or processed foods. These foods tend to be high in fat, sodium, and added sugar.  Shop around the outside edge of the grocery store. This includes fresh fruits and vegetables, bulk grains, fresh meats, and fresh dairy. Cooking  Use low-heat cooking methods, such as baking, instead of high-heat cooking methods like deep frying.  Cook using healthy oils, such as olive, canola, or sunflower oil.  Avoid cooking with butter, cream, or high-fat meats. Meal planning  Eat meals and snacks regularly, preferably at the same times every day. Avoid going  long periods of time without eating.  Eat foods high in fiber, such as fresh fruits, vegetables, beans, and whole grains. Talk to your dietitian about how many servings of carbohydrates you can eat at each meal.  Eat 4-6 ounces of lean protein each day, such as lean meat, chicken, fish, eggs, or tofu. 1 ounce is equal to 1 ounce of meat, chicken, or fish, 1 egg, or 1/4 cup of tofu.  Eat some foods each day that contain healthy fats, such as avocado, nuts, seeds, and fish. Lifestyle   Check your blood glucose regularly.  Exercise at least 30 minutes 5 or more days each week, or as told by your health care provider.  Take medicines as told by your health care provider.  Do not use any products that contain nicotine or tobacco, such as cigarettes and e-cigarettes. If you need help quitting, ask your health care provider.  Work with a Social worker or diabetes educator to identify strategies to manage stress and any emotional and social challenges. What are some questions to ask my health care  provider?  Do I need to meet with a diabetes educator?  Do I need to meet with a dietitian?  What number can I call if I have questions?  When are the best times to check my blood glucose? Where to find more information:  American Diabetes Association: diabetes.org/food-and-fitness/food  Academy of Nutrition and Dietetics: PokerClues.dk  [employer] of Diabetes and Digestive and Kidney Diseases (NIH): ContactWire.be Summary  A healthy meal plan will help you control your blood glucose and maintain a healthy lifestyle.  Working with a diet and nutrition specialist (dietitian) can help you make a meal plan that is best for you.  Keep in mind that carbohydrates and alcohol have immediate effects on your blood glucose levels. It is important to count carbohydrates and to use alcohol carefully. This information is not intended to replace advice given to you by your health care provider. Make sure you discuss any questions you have with your health care provider. Document Released: 12/20/2004 Document Revised: 04/29/2016 Document Reviewed: 04/29/2016 Elsevier Interactive Patient Education  [employer].

## 2017-12-29 NOTE — Progress Notes (Signed)
Jose Snow 47 y.o.   Chief Complaint  Patient presents with  . Leg Pain    BOTH - cramping continous  . Eye Problem    both with blurred vision - unable to see eye chart    HISTORY OF PRESENT ILLNESS: This is a 47 y.o. male with history of liver cirrhosis and recently diagnosed diabetes complaining of chronic cramping to both lower legs and chronic blurred vision.  Was started on metformin and Januvia but were not able to start these right away so diabetes has remained uncontrolled.  No pain at present time.  Has intermittent lower leg cramping.  No new symptomatology.  Scheduled to see endocrinologist today at 1 PM.  HPI   Prior to Admission medications   Medication Sig Start Date End Date Taking? Authorizing Provider  baclofen (LIORESAL) 10 MG tablet TK 1/2 TO 1 T PO Q 8 H PRF MUSCLE SPASMS 12/16/17  Yes [provider]  furosemide (LASIX) 40 MG tablet Take 1 tablet (40 mg total) by mouth daily. 11/06/17 02/04/18 Yes Milus Banister, MD  lactulose (CHRONULAC) 10 GM/15ML solution Take 45 mLs (30 g total) by mouth daily. 12/23/17  Yes Esterwood, Amy S, PA-C  levocetirizine (XYZAL) 5 MG tablet Take 1 tablet (5 mg total) by mouth every evening. 07/11/17  Yes Tereasa Coop, PA-C  Loperamide HCl (IMODIUM PO) Take by mouth as needed.   Yes [provider]  metFORMIN (GLUCOPHAGE) 500 MG tablet  12/25/17  Yes [provider]  nadolol (CORGARD) 20 MG tablet Take 1 tablet (20 mg total) by mouth daily. 10/23/17  Yes Milus Banister, MD  omeprazole (PRILOSEC) 40 MG capsule Take 1 capsule (40 mg total) by mouth daily. 12/05/17  Yes Milus Banister, MD  sitaGLIPtin (JANUVIA) 25 MG tablet Take 1 tablet (25 mg total) by mouth daily. 12/23/17  Yes Marialy Urbanczyk, Ines Bloomer, MD  spironolactone (ALDACTONE) 100 MG tablet Take 1 tablet (100 mg total) by mouth daily. 11/06/17 02/04/18 Yes Milus Banister, MD  sucralfate (CARAFATE) 1 GM/10ML suspension Take 10 mLs (1 g total) by mouth 4  (four) times daily. Take between meals and at bedtime. 12/23/17  Yes Esterwood, Amy S, PA-C  tenofovir (VIREAD) 300 MG tablet Take 300 mg by mouth daily.   Yes [provider]    No Known Allergies  Patient Active Problem List   Diagnosis Date Noted  . Pancytopenia ([place]) 12/03/2017  . Hyperglycemia 12/03/2017  . Cramps, extremity 12/03/2017  . Abnormal echocardiogram 12/03/2017  . New onset type 2 diabetes mellitus ([place]) 12/03/2017  . History of hepatitis B 12/03/2017  . Diarrhea   . 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 Heart murmur 08/01/2017  . Hepatitis B 02/17/2015  . Elevated bilirubin   . Liver cirrhosis secondary to NASH (nonalcoholic steatohepatitis) ([place]) 11/25/2014  . Thrombocytopenia ([place]) 11/25/2014  . History of TB (tuberculosis) 07/16/2010    Past Medical History:  Diagnosis Date  . Cirrhosis ([place])   . Diabetes mellitus ([place])   . Hepatitis B   . Lymphocytic colitis   . 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     Past Surgical History:  Procedure Laterality Date  . BIOPSY  10/23/2017   Procedure: BIOPSY;  Surgeon: Milus Banister, MD;  Location: [facility];  Service: Endoscopy;;  . COLONOSCOPY WITH PROPOFOL N/A 10/23/2017   Procedure: COLONOSCOPY WITH PROPOFOL;  Surgeon: Milus Banister, MD;  Location: [facility];  Service: Endoscopy;  Laterality: N/A;  . COMPLEX WOUND CLOSURE Right 12/15/2015   Procedure: COMPLEX WOUND CLOSURE;  Surgeon: Leanora Cover, MD;  Location: [facility] [place];  Service: Orthopedics;  Laterality: Right;  . ESOPHAGOGASTRODUODENOSCOPY (EGD) WITH PROPOFOL N/A 10/23/2017   Procedure: ESOPHAGOGASTRODUODENOSCOPY (EGD) WITH PROPOFOL;  Surgeon: Milus Banister, MD;  Location: [facility];  Service: Endoscopy;  Laterality: N/A;  . I&D EXTREMITY Right 12/15/2015   Procedure: IRRIGATION AND DEBRIDEMENT AND REVISION AMPUTATION RIGHT RING FINGER;  Surgeon: Leanora Cover, MD;  Location: [facility];  Service: Orthopedics;  Laterality:  Right;    Social History   Socioeconomic History  . Marital status: Married    Spouse name: Not on file  . Number of children: 4  . Years of education: Not on file  . Highest education level: Not on file  Occupational History  . 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 Financial resource strain: Not on file  . Food insecurity:    Worry: Not on file    Inability: Not on file  . Transportation needs:    Medical: Not on file    Non-medical: Not on file  Tobacco Use  . Smoking status: Former Smoker    Packs/day: 0.50    Years: 8.00    Pack years: 4.00    Types: Cigarettes    Last attempt to quit: 04/08/1990    Years since quitting: 27.7  . Smokeless tobacco: Never Used  Substance and Sexual Activity  . Alcohol use: No  . Drug use: No  . Sexual activity: Yes  Lifestyle  . Physical activity:    Days per week: Not on file    Minutes per session: Not on file  . Stress: Not on file  Relationships  . Social connections:    Talks on phone: Not on file    Gets together: Not on file    Attends religious service: Not on file    Active member of club or organization: Not on file    Attends meetings of clubs or organizations: Not on file    Relationship status: Not on file  . Intimate partner violence:    Fear of current or ex partner: Not on file    Emotionally abused: Not on file    Physically abused: Not on file    Forced sexual activity: Not on file  Other Topics Concern  . 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  Lives with wife and four children.     Family History  Problem Relation Age of Onset  . Colon cancer Neg Hx   . Esophageal cancer Neg Hx   . Pancreatic cancer Neg Hx   . Stomach cancer Neg Hx   . Liver disease Neg Hx      Review of Systems  Constitutional: Negative.  Negative for chills and fever.  HENT: Negative.  Negative for hearing loss and sore throat.   Eyes: Positive for blurred vision.  Negative for pain, discharge and redness.  Respiratory: Negative.  Negative for cough and shortness of breath.   Cardiovascular: Negative.  Negative for chest pain and palpitations.  Gastrointestinal: Negative for abdominal pain, blood in stool, diarrhea, melena, nausea and vomiting.  Genitourinary: Negative.  Negative for dysuria and hematuria.  Musculoskeletal: Negative.  Negative for myalgias and neck pain.  Skin: Negative.  Negative for rash.  Neurological: Positive for sensory change (feet). Negative for dizziness and headaches.  Endo/Heme/Allergies:  Negative.   All other systems reviewed and are negative.   Vitals:   12/29/17 1105  BP: (!) 94/50  Pulse: 74  Resp: 16  Temp: 98.7 F (37.1 C)  SpO2: 97%    Physical Exam  Constitutional: He is oriented to person, place, and time. He appears well-developed. He has a sickly appearance.  thin  HENT:  Head: Normocephalic and atraumatic.  Nose: Nose normal.  Mouth/Throat: Oropharynx is clear and moist.  Eyes: Pupils are equal, round, and reactive to light. Conjunctivae and EOM are normal.  Neck: Normal range of motion. Neck supple.  Cardiovascular: Normal rate, regular rhythm, normal heart sounds and intact distal pulses.  Pulmonary/Chest: Effort normal and breath sounds normal.  Abdominal: Soft. He exhibits no distension and no mass. There is no tenderness.  Musculoskeletal: Normal range of motion. He exhibits no edema.  Lower extremities: Warm to touch.  No erythema.  Good peripheral pulses.  Good capillary refill.  Nontender to touch.  No swelling.  Neurological: He is alert and oriented to person, place, and time. No sensory deficit. He exhibits normal muscle tone.  Skin: Skin is warm and dry. Capillary refill takes less than 2 seconds. No rash noted.  Psychiatric: He has a normal mood and affect. His behavior is normal.   Results for orders placed or performed in visit on 12/29/17 (from the past 24 hour(s))  POCT glucose  (manual entry)     Status: None   Collection Time: 12/29/17 11:49 AM  Result Value Ref Range   POC Glucose >444 70 - 99 mg/dl  POCT glycosylated hemoglobin (Hb A1C)     Status: Abnormal   Collection Time: 12/29/17 11:52 AM  Result Value Ref Range   Hemoglobin A1C 9.9 (A) 4.0 - 5.6 %   HbA1c POC (<> result, manual entry)     HbA1c, POC (prediabetic range)     HbA1c, POC (controlled diabetic range)       ASSESSMENT & PLAN: Y was seen today for leg pain and eye problem.  Diagnoses and all orders for this visit:  New onset type 2 diabetes mellitus (HCC) -     Flu Vaccine QUAD 36+ mos IM -     Tdap vaccine greater than or equal to 7yo IM -     POCT glucose (manual entry) -     POCT glycosylated hemoglobin (Hb A1C)  Liver cirrhosis secondary to NASH (nonalcoholic steatohepatitis) (HCC) -     Flu Vaccine QUAD 36+ mos IM -     Tdap vaccine greater than or equal to 7yo IM  Diabetic polyneuropathy associated with type 2 diabetes mellitus (HCC)  Blurred vision, bilateral Comments: Most likely diabetic  Follow-up with endocrinologist today at 1 PM as scheduled.  Patient Instructions       If you have lab work done today you will be contacted with your lab results within the next 2 weeks.  If you have not heard from [place] then please contact us. The fastest way to get your results is to register for My Chart.   IF you received an x-ray today, you will receive an invoice from [facility]. Please contact [facility] at [phone] with questions or concerns regarding your invoice.   IF you received labwork today, you will receive an invoice from [facility]. Please contact [facility] at [phone] with questions or concerns regarding your invoice.   Our billing staff will not be able to assist you with questions regarding bills from these companies.  You will be contacted with the lab results as soon as they are available. The fastest way to get your results is to  activate your My Chart account. Instructions are located on the last page of this paperwork. If you have not heard from [place] regarding the results in 2 weeks, please contact this office.     Cirrhosis Cirrhosis is long-term (chronic) liver injury. The liver is your largest internal organ, and it performs many functions. The liver converts food into energy, removes toxic material from your blood, makes important proteins, and absorbs necessary vitamins from your diet. If you have cirrhosis, it means many of your healthy liver cells have been replaced by scar tissue. This prevents blood from flowing through your liver, which makes it difficult for your liver to function. This scarring is not reversible, but treatment can prevent it from getting worse. What are the causes? Hepatitis C and long-term alcohol abuse are the most common causes of cirrhosis. Other causes include:  Nonalcoholic fatty liver disease.  Hepatitis B infection.  Autoimmune hepatitis.  Diseases that cause blockage of ducts inside the liver.  Inherited liver diseases.  Reactions to certain long-term medicines.  Parasitic infections.  Long-term exposure to certain toxins.  What increases the risk? You may have a higher risk of cirrhosis if you:  Have certain hepatitis viruses.  Abuse alcohol, especially if you are male.  Are overweight.  Share needles.  Have unprotected sex with someone who has hepatitis.  What are the signs or symptoms? You may not have any signs and symptoms at first. Symptoms may not develop until the damage to your liver starts to get worse. Signs and symptoms of cirrhosis may include:  Tenderness in the right-upper part of your abdomen.  Weakness and tiredness (fatigue).  Loss of appetite.  Nausea.  Weight loss and muscle loss.  Itchiness.  Yellow skin and eyes (jaundice).  Buildup of fluid in the abdomen (ascites).  Swelling of the feet and ankles (edema).  Appearance  of tiny blood vessels under the skin.  Mental confusion.  Easy bruising and bleeding.  How is this diagnosed? Your health care provider may suspect cirrhosis based on your symptoms and medical history, especially if you have other medical conditions or a history of alcohol abuse. Your health care provider will do a physical exam to feel your liver and check for signs of cirrhosis. Your health care provider may perform other tests, including:  Blood tests to check: ? Whether you have hepatitis B or C. ? Kidney function. ? Liver function.  Imaging tests such as: ? MRI or CT scan to look for changes seen in advanced cirrhosis. ? Ultrasound to see if normal liver tissue is being replaced by scar tissue.  A procedure using a long needle to take a sample of liver tissue (biopsy) for examination under a microscope. Liver biopsy can confirm the diagnosis of cirrhosis.  How is this treated? Treatment depends on how damaged your liver is and what caused the damage. Treatment may include treating cirrhosis symptoms or treating the underlying causes of the condition to try to slow the progression of the damage. Treatment may include:  Making lifestyle changes, such as: ? Eating a healthy diet. ? Restricting salt intake. ? Maintaining a healthy weight. ? Not abusing drugs or alcohol.  Taking medicines to: ? Treat liver infections or other infections. ? Control itching. ? Reduce fluid buildup. ? Reduce certain blood toxins. ? Reduce risk of bleeding from enlarged blood vessels  in the stomach or esophagus (varices).  If varices are causing bleeding problems, you may need treatment with a procedure that ties up the vessels causing them to fall off (band ligation).  If cirrhosis is causing your liver to fail, your health care provider may recommend a liver transplant.  Other treatments may be recommended depending on any complications of cirrhosis, such as liver-related kidney failure  (hepatorenal syndrome).  Follow these instructions at home:  Take medicines only as directed by your health care provider. Do not use drugs that are toxic to your liver. Ask your health care provider before taking any new medicines, including over-the-counter medicines.  Rest as needed.  Eat a well-balanced diet. Ask your health care provider or dietitian for more information.  You may have to follow a low-salt diet or restrict your water intake as directed.  Do not drink alcohol. This is especially important if you are taking acetaminophen.  Keep all follow-up visits as directed by your health care provider. This is important. Contact a health care provider if:  You have fatigue or weakness that is getting worse.  You develop swelling of the hands, feet, legs, or face.  You have a fever.  You develop loss of appetite.  You have nausea or vomiting.  You develop jaundice.  You develop easy bruising or bleeding. Get help right away if:  You vomit bright red blood or a material that looks like coffee grounds.  You have blood in your stools.  Your stools appear black and tarry.  You become confused.  You have chest pain or trouble breathing. This information is not intended to replace advice given to you by your health care provider. Make sure you discuss any questions you have with your health care provider. Document Released: 03/25/2005 Document Revised: 08/03/2015 Document Reviewed: 12/01/2013 Elsevier Interactive Patient Education  2018 [employer].  Diabetes Mellitus and Nutrition When you have diabetes (diabetes mellitus), it is very important to have healthy eating habits because your blood sugar (glucose) levels are greatly affected by what you eat and drink. Eating healthy foods in the appropriate amounts, at about the same times every day, can help you:  Control your blood glucose.  Lower your risk of heart disease.  Improve your blood pressure.  Reach or  maintain a healthy weight.  Every person with diabetes is different, and each person has different needs for a meal plan. Your health care provider may recommend that you work with a diet and nutrition specialist (dietitian) to make a meal plan that is best for you. Your meal plan may vary depending on factors such as:  The calories you need.  The medicines you take.  Your weight.  Your blood glucose, blood pressure, and cholesterol levels.  Your activity level.  Other health conditions you have, such as heart or kidney disease.  How do carbohydrates affect me? Carbohydrates affect your blood glucose level more than any other type of food. Eating carbohydrates naturally increases the amount of glucose in your blood. Carbohydrate counting is a method for keeping track of how many carbohydrates you eat. Counting carbohydrates is important to keep your blood glucose at a healthy level, especially if you use insulin or take certain oral diabetes medicines. It is important to know how many carbohydrates you can safely have in each meal. This is different for every person. Your dietitian can help you calculate how many carbohydrates you should have at each meal and for snack. Foods that contain carbohydrates include:  Bread, cereal, rice, pasta, and crackers.  Potatoes and corn.  Peas, beans, and lentils.  Milk and yogurt.  Fruit and juice.  Desserts, such as cakes, cookies, ice cream, and candy.  How does alcohol affect me? Alcohol can cause a sudden decrease in blood glucose (hypoglycemia), especially if you use insulin or take certain oral diabetes medicines. Hypoglycemia can be a life-threatening condition. Symptoms of hypoglycemia (sleepiness, dizziness, and confusion) are similar to symptoms of having too much alcohol. If your health care provider says that alcohol is safe for you, follow these guidelines:  Limit alcohol intake to no more than 1 drink per day for nonpregnant  women and 2 drinks per day for men. One drink equals 12 oz of beer, 5 oz of wine, or 1 oz of hard liquor.  Do not drink on an empty stomach.  Keep yourself hydrated with water, diet soda, or unsweetened iced tea.  Keep in mind that regular soda, juice, and other mixers may contain a lot of sugar and must be counted as carbohydrates.  What are tips for following this plan? Reading food labels  Start by checking the serving size on the label. The amount of calories, carbohydrates, fats, and other nutrients listed on the label are based on one serving of the food. Many foods contain more than one serving per package.  Check the total grams (g) of carbohydrates in one serving. You can calculate the number of servings of carbohydrates in one serving by dividing the total carbohydrates by 15. For example, if a food has 30 g of total carbohydrates, it would be equal to 2 servings of carbohydrates.  Check the number of grams (g) of saturated and trans fats in one serving. Choose foods that have low or no amount of these fats.  Check the number of milligrams (mg) of sodium in one serving. Most people should limit total sodium intake to less than 2,300 mg per day.  Always check the nutrition information of foods labeled as "low-fat" or "nonfat". These foods may be higher in added sugar or refined carbohydrates and should be avoided.  Talk to your dietitian to identify your daily goals for nutrients listed on the label. Shopping  Avoid buying canned, premade, or processed foods. These foods tend to be high in fat, sodium, and added sugar.  Shop around the outside edge of the grocery store. This includes fresh fruits and vegetables, bulk grains, fresh meats, and fresh dairy. Cooking  Use low-heat cooking methods, such as baking, instead of high-heat cooking methods like deep frying.  Cook using healthy oils, such as olive, canola, or sunflower oil.  Avoid cooking with butter, cream, or high-fat  meats. Meal planning  Eat meals and snacks regularly, preferably at the same times every day. Avoid going long periods of time without eating.  Eat foods high in fiber, such as fresh fruits, vegetables, beans, and whole grains. Talk to your dietitian about how many servings of carbohydrates you can eat at each meal.  Eat 4-6 ounces of lean protein each day, such as lean meat, chicken, fish, eggs, or tofu. 1 ounce is equal to 1 ounce of meat, chicken, or fish, 1 egg, or 1/4 cup of tofu.  Eat some foods each day that contain healthy fats, such as avocado, nuts, seeds, and fish. Lifestyle   Check your blood glucose regularly.  Exercise at least 30 minutes 5 or more days each week, or as told by your health care provider.  Take medicines as  told by your health care provider.  Do not use any products that contain nicotine or tobacco, such as cigarettes and e-cigarettes. If you need help quitting, ask your health care provider.  Work with a Social worker or diabetes educator to identify strategies to manage stress and any emotional and social challenges. What are some questions to ask my health care provider?  Do I need to meet with a diabetes educator?  Do I need to meet with a dietitian?  What number can I call if I have questions?  When are the best times to check my blood glucose? Where to find more information:  American Diabetes Association: diabetes.org/food-and-fitness/food  Academy of Nutrition and Dietetics: PokerClues.dk  [employer] of Diabetes and Digestive and Kidney Diseases (NIH): ContactWire.be Summary  A healthy meal plan will help you control your blood glucose and maintain a healthy lifestyle.  Working with a diet and nutrition specialist (dietitian) can help you make a meal plan that is best for you.  Keep in mind that carbohydrates  and alcohol have immediate effects on your blood glucose levels. It is important to count carbohydrates and to use alcohol carefully. This information is not intended to replace advice given to you by your health care provider. Make sure you discuss any questions you have with your health care provider. Document Released: 12/20/2004 Document Revised: 04/29/2016 Document Reviewed: 04/29/2016 Elsevier Interactive Patient Education  2018 [employer].      Agustina Caroli, MD [facility]

## 2017-12-30 ENCOUNTER — Ambulatory Visit: Payer: PRIVATE HEALTH INSURANCE | Admitting: Nurse Practitioner

## 2018-01-20 ENCOUNTER — Ambulatory Visit: Payer: PRIVATE HEALTH INSURANCE | Admitting: Nurse Practitioner

## 2018-01-20 NOTE — Progress Notes (Deleted)
ASSESSMENT      PLAN:        HPI:       Chief Complaint:        Data Reviewed:      Past Medical History:  Diagnosis Date  . Cirrhosis ([place])   . Diabetes mellitus ([place])   . Hepatitis B   . Lymphocytic colitis   . 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      Past Surgical History:  Procedure Laterality Date  . BIOPSY  10/23/2017   Procedure: BIOPSY;  Surgeon: Milus Banister, MD;  Location: [facility];  Service: Endoscopy;;  . COLONOSCOPY WITH PROPOFOL N/A 10/23/2017   Procedure: COLONOSCOPY WITH PROPOFOL;  Surgeon: Milus Banister, MD;  Location: [facility];  Service: Endoscopy;  Laterality: N/A;  . COMPLEX WOUND CLOSURE Right 12/15/2015   Procedure: COMPLEX WOUND CLOSURE;  Surgeon: Leanora Cover, MD;  Location: [facility];  Service: Orthopedics;  Laterality: Right;  . ESOPHAGOGASTRODUODENOSCOPY (EGD) WITH PROPOFOL N/A 10/23/2017   Procedure: ESOPHAGOGASTRODUODENOSCOPY (EGD) WITH PROPOFOL;  Surgeon: Milus Banister, MD;  Location: [facility];  Service: Endoscopy;  Laterality: N/A;  . I&D EXTREMITY Right 12/15/2015   Procedure: IRRIGATION AND DEBRIDEMENT AND REVISION AMPUTATION RIGHT RING FINGER;  Surgeon: Leanora Cover, MD;  Location: [place];  Service: Orthopedics;  Laterality: Right;   Family History  Problem Relation Age of Onset  . Colon cancer Neg Hx   . Esophageal cancer Neg Hx   . Pancreatic cancer Neg Hx   . Stomach cancer Neg Hx   . Liver disease Neg Hx    Social History   Tobacco Use  . Smoking status: Former Smoker    Packs/day: 0.50    Years: 8.00    Pack years: 4.00    Types: Cigarettes    Last attempt to quit: 04/08/1990    Years since quitting: 27.8  . Smokeless tobacco: Never Used  Substance Use Topics  . Alcohol use: No  . Drug use: No   Current Outpatient Medications  Medication Sig Dispense Refill  . baclofen (LIORESAL) 10 MG tablet TK 1/2 TO 1 T PO Q 8 H PRF MUSCLE SPASMS  0  . furosemide (LASIX)  40 MG tablet Take 1 tablet (40 mg total) by mouth daily. 90 tablet 3  . lactulose (CHRONULAC) 10 GM/15ML solution Take 45 mLs (30 g total) by mouth daily. 240 mL 3  . levocetirizine (XYZAL) 5 MG tablet Take 1 tablet (5 mg total) by mouth every evening. 30 tablet 0  . Loperamide HCl (IMODIUM PO) Take by mouth as needed.    . metFORMIN (GLUCOPHAGE) 500 MG tablet   3  . nadolol (CORGARD) 20 MG tablet Take 1 tablet (20 mg total) by mouth daily. 30 tablet 3  . omeprazole (PRILOSEC) 40 MG capsule Take 1 capsule (40 mg total) by mouth daily. 30 capsule 11  . sitaGLIPtin (JANUVIA) 25 MG tablet Take 1 tablet (25 mg total) by mouth daily. 30 tablet 5  . spironolactone (ALDACTONE) 100 MG tablet Take 1 tablet (100 mg total) by mouth daily. 90 tablet 3  . sucralfate (CARAFATE) 1 GM/10ML suspension Take 10 mLs (1 g total) by mouth 4 (four) times daily. Take between meals and at bedtime. 420 mL 3  . tenofovir (VIREAD) 300 MG tablet Take 300 mg by mouth daily.     No current facility-administered  medications for this visit.    No Known Allergies   Review of Systems: All systems reviewed and negative except where noted in HPI.   Creatinine clearance cannot be calculated (Patient's most recent lab result is older than the maximum 21 days allowed.)   Physical Exam:    Wt Readings from Last 3 Encounters:  12/29/17 109 lb (49.4 kg)  12/26/17 112 lb 3.2 oz (50.9 kg)  12/23/17 108 lb 2 oz (49 kg)    There were no vitals taken for this visit. Constitutional:  Pleasant male in no acute distress. Psychiatric: Normal mood and affect. Behavior is normal. EENT: Pupils normal.  Conjunctivae are normal. No scleral icterus. Neck supple.  Cardiovascular: Normal rate, regular rhythm. No edema Pulmonary/chest: Effort normal and breath sounds normal. No wheezing, rales or rhonchi. Abdominal: Soft, nondistended, nontender. Bowel sounds active throughout. There are no masses palpable. No  hepatomegaly. Neurological: Alert and oriented to person place and time. Skin: Skin is warm and dry. No rashes noted.  Tye Savoy, NP  01/20/2018, 8:17 AM   Tereasa Coop, PA-C

## 2018-01-22 ENCOUNTER — Telehealth: Payer: Self-pay | Admitting: Emergency Medicine

## 2018-01-22 NOTE — Telephone Encounter (Signed)
Copied from [place] [phone]. Topic: General - Other >> Jan 22, 2018  3:31 PM Leward Quan A wrote: Reason for CRM: Patients daughter called to get an extension on her fathers work note. She stated that he last saw Dr Mitchel Honour who gave him a note but now he need another extension and need it today or he will be terminated from his job. Please advise  Ph# [phone]

## 2018-01-23 NOTE — Telephone Encounter (Signed)
Completed by Bard Herbert.

## 2018-01-29 NOTE — Telephone Encounter (Signed)
done

## 2018-02-02 ENCOUNTER — Telehealth: Payer: Self-pay | Admitting: Emergency Medicine

## 2018-02-02 NOTE — Telephone Encounter (Unsigned)
Copied from [place] [id number]. Topic: General - Other >> Feb 02, 2018 10:09 AM Parke Poisson wrote: Reason for CRM: Pt's daughter Hinda Kehr called requesting an extended work note.Her call back number is [phone]

## 2018-02-03 ENCOUNTER — Telehealth: Payer: Self-pay

## 2018-02-03 ENCOUNTER — Encounter: Payer: Self-pay | Admitting: Physician Assistant

## 2018-02-03 ENCOUNTER — Ambulatory Visit: Payer: PRIVATE HEALTH INSURANCE | Admitting: Gastroenterology

## 2018-02-03 ENCOUNTER — Ambulatory Visit: Payer: PRIVATE HEALTH INSURANCE | Admitting: Physician Assistant

## 2018-02-03 VITALS — BP 102/58 | HR 71 | Ht 63.0 in | Wt 121.0 lb

## 2018-02-03 DIAGNOSIS — K7460 Unspecified cirrhosis of liver: Secondary | ICD-10-CM

## 2018-02-03 DIAGNOSIS — K7290 Hepatic failure, unspecified without coma: Secondary | ICD-10-CM

## 2018-02-03 DIAGNOSIS — R1013 Epigastric pain: Secondary | ICD-10-CM

## 2018-02-03 DIAGNOSIS — B181 Chronic viral hepatitis B without delta-agent: Secondary | ICD-10-CM

## 2018-02-03 MED ORDER — OMEPRAZOLE 40 MG PO CPDR: 40.0000 mg | DELAYED_RELEASE_CAPSULE | Freq: Two times a day (BID) | ORAL | 11 refills | Status: DC

## 2018-02-03 NOTE — Patient Instructions (Addendum)
If you are age 49 or older, your body mass index should be between 23-30. Your Body mass index is 21.43 kg/m. If this is out of the aforementioned range listed, please consider follow up with your Primary Care Provider.  If you are age 35 or younger, your body mass index should be between 19-25. Your Body mass index is 21.43 kg/m. If this is out of the aformentioned range listed, please consider follow up with your Primary Care Provider.   We have sent the following medications to your pharmacy for you to pick up at your convenience: Omeprazole 40 mg - take twice daily.  Follow up with [name] in 2-3 months.  Call back for an appointment in a month as the schedule is not available at this time.  Thank you for choosing me and [facility].   Ellouise Newer, PA-C

## 2018-02-03 NOTE — Telephone Encounter (Signed)
Jose Snow, friend of pt who accompanyed pt to appt today, called and asked if [name] or 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 At today's appt we discussed that he is feeling dizzy when he stands up. [name] indicated she will discuss Nadolol with [name] to determine if they will change his dose or medication due to low blood pressure and him feeling dizzy.  Jose Snow said she will try to get a fax number for [place] to fax the work note to his employer and will call us back or wait for our call back about the Nadolol).

## 2018-02-03 NOTE — Progress Notes (Signed)
Chief Complaint: Decompensated hepatic cirrhosis  HPI:    [name] is a 47 year old [place] male, non-English-speaking, who presents clinic today with a translator, know to [name] who returns to clinic today for follow-up of his decompensated hepatic cirrhosis.    12/23/2017 patient seen by Nicoletta Ba, PA.  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 Lurlean Leyden and his transplant work-up was in progress.  Most recent MELD calculated 18 in July 2019, up-to-date with [facility] screening with recent CT and AFP of 13.7.  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  Also history of lymphocytic colitis.  At that time patient felt poorly in general with considerable fatigue and weakness.  He had significant hyperglycemia with most recent nonfasting glucose 435 and his Budesonide was discontinued also recommend he see his PCP in follow-up.  His lymphocytic colitis had actually turned towards constipation and he was started on Lactulose 30 cc p.o. once daily.  Continued on Omeprazole 40 mg daily and Carafate liquid 1 g between meals and at bedtime.  Continued on Lasix 40 mg p.o. every morning and Aldactone 100 mg p.o. every evening.  CMP, CBC and INR rechecked.  He was encouraged to speak with PCP in regards to disability.  His labs are all stable for him.    Today, the patient explains that he is doing some better after being seen by Amy.  He has seen his primary care provider and is on Metformin as well as Januvia for his diabetes and is starting to feel less fatigued.  He does continue with some positional dizziness and his friend who is with him tells me this it has been normal for him to run a low blood pressure, currently 102/58 today ever since being started on blood pressure meds.  He does have follow-up with his PCP next Monday.    Does report  today that he is now having regular bowel movements once a day and is using his Lactulose 30 cc daily.    Patient's main complaint for me today is of epigastric pain.  He tells me this has increased over the past couple of weeks.  He was having nausea but this is some better.  Denies any heartburn or reflux.    Did follow with his transplant physician regarding his hepatic cirrhosis and has "a longer work-up" before they are able to do anything.  Apparently they did say that his labs are looking better and they think his antiviral medicine is working.    Denies fever, chills, weight loss, anorexia, vomiting or symptoms that awaken him from sleep.     Past Medical History:  Diagnosis Date  . Cirrhosis ([place])   . Diabetes mellitus ([place])   . Hepatitis B   . Lymphocytic colitis   . 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     Past Surgical History:  Procedure Laterality Date  . BIOPSY  10/23/2017   Procedure: BIOPSY;  Surgeon: Milus Banister, MD;  Location: [facility];  Service: Endoscopy;;  . COLONOSCOPY WITH PROPOFOL N/A 10/23/2017   Procedure: COLONOSCOPY WITH PROPOFOL;  Surgeon: Milus Banister, MD;  Location: [facility];  Service: Endoscopy;  Laterality: N/A;  . COMPLEX WOUND CLOSURE Right 12/15/2015   Procedure: COMPLEX WOUND CLOSURE;  Surgeon: Leanora Cover, MD;  Location: [facility];  Service: Orthopedics;  Laterality: Right;  . ESOPHAGOGASTRODUODENOSCOPY (EGD) WITH PROPOFOL N/A 10/23/2017   Procedure: ESOPHAGOGASTRODUODENOSCOPY (EGD) WITH PROPOFOL;  Surgeon: Milus Banister, MD;  Location: [facility];  Service: Endoscopy;  Laterality: N/A;  . I&D EXTREMITY Right 12/15/2015   Procedure: IRRIGATION AND DEBRIDEMENT AND REVISION AMPUTATION RIGHT RING FINGER;  Surgeon: Leanora Cover, MD;  Location: [place];  Service: Orthopedics;  Laterality: Right;    Current Outpatient Medications  Medication Sig Dispense Refill  . baclofen (LIORESAL) 10 MG tablet TK 1/2 TO 1 T PO Q 8 H PRF  MUSCLE SPASMS  0  . furosemide (LASIX) 40 MG tablet Take 1 tablet (40 mg total) by mouth daily. 90 tablet 3  . lactulose (CHRONULAC) 10 GM/15ML solution Take 45 mLs (30 g total) by mouth daily. 240 mL 3  . levocetirizine (XYZAL) 5 MG tablet Take 1 tablet (5 mg total) by mouth every evening. 30 tablet 0  . Loperamide HCl (IMODIUM PO) Take by mouth as needed.    . metFORMIN (GLUCOPHAGE) 500 MG tablet   3  . nadolol (CORGARD) 20 MG tablet Take 1 tablet (20 mg total) by mouth daily. 30 tablet 3  . omeprazole (PRILOSEC) 40 MG capsule Take 1 capsule (40 mg total) by mouth daily. 30 capsule 11  . sitaGLIPtin (JANUVIA) 25 MG tablet Take 1 tablet (25 mg total) by mouth daily. 30 tablet 5  . spironolactone (ALDACTONE) 100 MG tablet Take 1 tablet (100 mg total) by mouth daily. 90 tablet 3  . sucralfate (CARAFATE) 1 GM/10ML suspension Take 10 mLs (1 g total) by mouth 4 (four) times daily. Take between meals and at bedtime. 420 mL 3  . tenofovir (VIREAD) 300 MG tablet Take 300 mg by mouth daily.     No current facility-administered medications for this visit.     Allergies as of 02/03/2018  . (No Known Allergies)    Family History  Problem Relation Age of Onset  . Colon cancer Neg Hx   . Esophageal cancer Neg Hx   . Pancreatic cancer Neg Hx   . Stomach cancer Neg Hx   . Liver disease Neg Hx     Social History   Socioeconomic History  . Marital status: Married    Spouse name: Not on file  . Number of children: 4  . Years of education: Not on file  . Highest education level: Not on file  Occupational History  . 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 Financial resource strain: Not on file  . Food insecurity:    Worry: Not on file    Inability: Not on file  . Transportation needs:    Medical: Not on file    Non-medical: Not on file  Tobacco Use  . Smoking status: Former Smoker    Packs/day: 0.50    Years: 8.00    Pack years: 4.00    Types: Cigarettes      Last attempt to quit: 04/08/1990    Years since quitting: 27.8  . Smokeless tobacco: Never Used  Substance and Sexual Activity  . Alcohol use: No  . Drug use: No  . Sexual activity: Yes  Lifestyle  . Physical activity:    Days per week: Not on file    Minutes per session: Not on file  . Stress: Not on file  Relationships  . Social connections:    Talks on phone: Not on file    Gets  together: Not on file    Attends religious service: Not on file    Active member of club or organization: Not on file    Attends meetings of clubs or organizations: Not on file    Relationship status: Not on file  . Intimate partner violence:    Fear of current or ex partner: Not on file    Emotionally abused: Not on file    Physically abused: Not on file    Forced sexual activity: Not on file  Other Topics Concern  . 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  Lives with wife and four children.     Review of Systems:    Constitutional: No weight loss, fever or chills Cardiovascular: No chest pain  Respiratory: No SOB  Gastrointestinal: See HPI and otherwise negative   Physical Exam:  Vital signs: BP (!) 102/58   Pulse 71   Ht 5' 3"  (1.6 m)   Wt 121 lb (54.9 kg)   BMI 21.43 kg/m   Constitutional:   Pleasant Caucasian male appears to be in NAD, Well developed, Well nourished, alert and cooperative Respiratory: Respirations even and unlabored. Lungs clear to auscultation bilaterally.   No wheezes, crackles, or rhonchi.  Cardiovascular: Normal S1, S2. No MRG. Regular rate and rhythm. No peripheral edema, cyanosis or pallor.  Gastrointestinal:  Soft, nondistended, mild epigastric ttp. No rebound or guarding. Normal bowel sounds. No appreciable masses or hepatomegaly. Psychiatric: Demonstrates good judgement and reason without abnormal affect or behaviors.  RELEVANT LABS AND IMAGING: CBC    Component Value Date/Time   WBC 3.9 (L) 12/23/2017 1054   RBC 4.30  12/23/2017 1054   HGB 14.9 12/23/2017 1054   HGB 10.8 (L) 12/03/2017 1632   HCT 43.2 12/23/2017 1054   HCT 33.0 (L) 12/03/2017 1632   PLT 29.0 Repeated and verified X2. (LL) 12/23/2017 1054   PLT 24 (LL) 12/03/2017 1632   MCV 100.4 (H) 12/23/2017 1054   MCV 101.2 (A) 12/04/2017 1803   MCV 103 (H) 12/03/2017 1632   MCH 34.1 (H) 12/04/2017 2037   MCHC 34.5 12/23/2017 1054   RDW 15.0 12/23/2017 1054   RDW 15.9 (H) 12/03/2017 1632   LYMPHSABS 1.0 12/23/2017 1054   LYMPHSABS 0.9 12/03/2017 1632   MONOABS 0.4 12/23/2017 1054   EOSABS 0.1 12/23/2017 1054   EOSABS 0.1 12/03/2017 1632   BASOSABS 0.0 12/23/2017 1054   BASOSABS 0.0 12/03/2017 1632    CMP     Component Value Date/Time   NA 127 (L) 12/23/2017 1054   NA 130 (L) 12/04/2017 1803   K 5.6 (H) 12/23/2017 1054   CL 93 (L) 12/23/2017 1054   CO2 30 12/23/2017 1054   GLUCOSE 397 (H) 12/23/2017 1054   BUN 26 (H) 12/23/2017 1054   BUN 13 12/04/2017 1803   CREATININE 1.10 12/23/2017 1054   CREATININE 0.92 08/05/2017 1217   CALCIUM 8.9 12/23/2017 1054   PROT 7.1 12/23/2017 1054   PROT 5.8 (L) 12/03/2017 1632   ALBUMIN 3.1 (L) 12/23/2017 1054   ALBUMIN 2.7 (L) 12/03/2017 1632   AST 37 12/23/2017 1054   AST 63 (H) 08/05/2017 1217   ALT 58 (H) 12/23/2017 1054   ALT 43 08/05/2017 1217   ALKPHOS 105 12/23/2017 1054   BILITOT 4.0 (H) 12/23/2017 1054   BILITOT 1.5 (H) 12/03/2017 1632   BILITOT 2.9 (H) 08/05/2017 1217   GFRNONAA >60 12/04/2017 2037   GFRNONAA >60 08/05/2017 1217   GFRAA >60  12/04/2017 2037   GFRAA >60 08/05/2017 1217    Assessment: 1. Decompensated cirrhosis: from Hep B, on transplant list 2.  Epigastric pain: constant per patient, consider relation to portal gastropathy/gastritis 3.  Diarrhea: better, now normal stools with Lactulose qd  Plan: 1.  Discussed with patient that his epigastric pain may be coming from gastritis/portal gastropathy, increased his Omeprazole to 40 mg twice daily #60 with 5  refills 2.  Patient to continue all other medications as prescribed at this time 3.  Patient to follow with his PCP on Monday. Explained to him that I would wait until he sees them to get a letter so he can return to work. 4.  Will discuss changing/decreasing patient's Nadolol as he constantly runs a low blood pressure and this may be related to positional dizziness 5.  Patient to follow with [name] in the next 2 -3 months or sooner if necessary.  Ellouise Newer, PA-C [facility] 02/03/2018, 2:39 PM  Cc: Horald Pollen, *

## 2018-02-03 NOTE — Telephone Encounter (Signed)
Please advise 

## 2018-02-03 NOTE — Telephone Encounter (Signed)
Spoke with pt daughter and informed her that she could come pick-up note when needed.

## 2018-02-03 NOTE — Telephone Encounter (Signed)
Extended work note fine with me.  Thanks.

## 2018-02-04 ENCOUNTER — Telehealth: Payer: Self-pay | Admitting: Physician Assistant

## 2018-02-04 MED ORDER — NADOLOL 20 MG PO TABS: 10.0000 mg | ORAL_TABLET | Freq: Every day | ORAL | 3 refills | Status: DC

## 2018-02-04 NOTE — Telephone Encounter (Signed)
See additional phone note.

## 2018-02-04 NOTE — Progress Notes (Signed)
I agree with the above note, plan.    [name], thanks for seeing him.  Agree his nadolol may be contributing to problems now.  can you ask him to take 1/2 of his nadolol 27m pill once daily.  If he still does not tolerate, will have to consider band ligating instead.

## 2018-02-04 NOTE — Addendum Note (Signed)
Addended by: Roetta Sessions on: 02/04/2018 12:25 PM   Modules accepted: Orders

## 2018-02-04 NOTE — Telephone Encounter (Signed)
Pt's daughter Hinda Kehr called regarding message from yesterday and work note that pt needs from his work. Pls call her.

## 2018-02-04 NOTE — Telephone Encounter (Signed)
Work excuse Designer, fashion/clothing.  Called pt. He asked me to call his daughter at [phone]. Spoke with the daughter and relayed that he should take 1/2  20 mg tablet of Nadolol to see if that helps with the dizziness. She expressed understanding.  She asked me to fax the work letter to [phone].  Fax sent.

## 2018-02-04 NOTE — Telephone Encounter (Signed)
Per [name], please have patient take 1/2 of his Nadolol 59m pill qd and see if this helps, if this does not work, we will need to consider banding varices instead per Dr. JArdis Hughs. We can provide him with work note until 11/5- he sees his PCP 11/4 and he can get another note from them if still dizzy at that time. Thanks-JLL

## 2018-02-05 ENCOUNTER — Other Ambulatory Visit: Payer: Self-pay | Admitting: Nurse Practitioner

## 2018-02-05 DIAGNOSIS — B181 Chronic viral hepatitis B without delta-agent: Secondary | ICD-10-CM

## 2018-02-07 ENCOUNTER — Other Ambulatory Visit: Payer: Self-pay | Admitting: Physician Assistant

## 2018-02-07 DIAGNOSIS — L299 Pruritus, unspecified: Secondary | ICD-10-CM

## 2018-02-19 ENCOUNTER — Other Ambulatory Visit: Payer: Self-pay

## 2018-02-19 ENCOUNTER — Ambulatory Visit: Payer: No Typology Code available for payment source | Admitting: Emergency Medicine

## 2018-02-19 ENCOUNTER — Encounter: Payer: Self-pay | Admitting: Emergency Medicine

## 2018-02-19 VITALS — BP 86/55 | HR 59 | Temp 98.0°F | Resp 16 | Ht 62.75 in | Wt 119.6 lb

## 2018-02-19 DIAGNOSIS — I959 Hypotension, unspecified: Secondary | ICD-10-CM | POA: Diagnosis not present

## 2018-02-19 DIAGNOSIS — R42 Dizziness and giddiness: Secondary | ICD-10-CM | POA: Diagnosis not present

## 2018-02-19 DIAGNOSIS — E1169 Type 2 diabetes mellitus with other specified complication: Secondary | ICD-10-CM | POA: Diagnosis not present

## 2018-02-19 DIAGNOSIS — K7581 Nonalcoholic steatohepatitis (NASH): Secondary | ICD-10-CM

## 2018-02-19 DIAGNOSIS — K7460 Unspecified cirrhosis of liver: Secondary | ICD-10-CM

## 2018-02-19 NOTE — Patient Instructions (Addendum)
Stop taking furosemide (Lasix) because it is causing your blood pressure to go low. Hypotension As your heart beats, it forces blood through your body. This force is called blood pressure. If you have hypotension, you have low blood pressure. When your blood pressure is too low, you may not get enough blood to your brain. You may feel weak, feel light-headed, have a fast heartbeat, or even pass out (faint). Follow these instructions at home: Eating and drinking  Drink enough fluids to keep your pee (urine) clear or pale yellow.  Eat a healthy diet, and follow instructions from your doctor about eating or drinking restrictions. A healthy diet includes: ? Fresh fruits and vegetables. ? Whole grains. ? Low-fat (lean) meats. ? Low-fat dairy products.  Eat extra salt only as told. Do not add extra salt to your diet unless your doctor tells you to.  Eat small meals often.  Avoid standing up quickly after you eat. Medicines  Take over-the-counter and prescription medicines only as told by your doctor. ? Follow instructions from your doctor about changing how much you take (the dosage) of your medicines, if this applies. ? Do not stop or change your medicine on your own. General instructions  Wear compression stockings as told by your doctor.  Get up slowly from lying down or sitting.  Avoid hot showers and a lot of heat as told by your doctor.  Return to your normal activities as told by your doctor. Ask what activities are safe for you.  Do not use any products that contain nicotine or tobacco, such as cigarettes and e-cigarettes. If you need help quitting, ask your doctor.  Keep all follow-up visits as told by your doctor. This is important. Contact a doctor if:  You throw up (vomit).  You have watery poop (diarrhea).  You have a fever for more than 2-3 days.  You feel more thirsty than normal.  You feel weak and tired. Get help right away if:  You have chest  pain.  You have a fast or irregular heartbeat.  You lose feeling (get numbness) in any part of your body.  You cannot move your arms or your legs.  You have trouble talking.  You get sweaty or feel light-headed.  You faint.  You have trouble breathing.  You have trouble staying awake.  You feel confused. This information is not intended to replace advice given to you by your health care provider. Make sure you discuss any questions you have with your health care provider. Document Released: 06/19/2009 Document Revised: 12/12/2015 Document Reviewed: 12/12/2015 Elsevier Interactive Patient Education  2017 [employer].    If you have lab work done today you will be contacted with your lab results within the next 2 weeks.  If you have not heard from [place] then please contact us. The fastest way to get your results is to register for My Chart.   IF you received an x-ray today, you will receive an invoice from [facility]. Please contact [facility] at [phone] with questions or concerns regarding your invoice.   IF you received labwork today, you will receive an invoice from [facility]. Please contact [facility] at [phone] with questions or concerns regarding your invoice.   Our billing staff will not be able to assist you with questions regarding bills from these companies.  You will be contacted with the lab results as soon as they are available. The fastest way to get your results is to activate your My Chart account.  Instructions are located on the last page of this paperwork. If you have not heard from [place] regarding the results in 2 weeks, please contact this office.

## 2018-02-19 NOTE — Progress Notes (Signed)
Jose Snow 47 y.o.   Chief Complaint  Patient presents with  . Diabetes    follow up onset and patient wants a note to return to work if he is able  . Dizziness    x 2 weeks   BP Readings from Last 3 Encounters:  02/19/18 (!) 86/55  02/03/18 (!) 102/58  12/29/17 (!) 94/50    HISTORY OF PRESENT ILLNESS: This is a 47 y.o. male complaining of dizziness for 2 weeks.  Interpreter in the room with him.  Has a history of nonalcoholic liver cirrhosis and recently diagnosed diabetes.  Has been seen by endocrinologist and started on multiple diabetes medications. Here today requesting a work note. Medication list reviewed with patient.  Has been taking Lasix, Corgard, and Aldactone along with lactulose, insulin and Januvia.  Denies diarrhea, fever or chills, flulike symptoms, abdominal pain or any other significant symptoms. HPI   Prior to Admission medications   Medication Sig Start Date End Date Taking? Authorizing Provider  levocetirizine (XYZAL) 5 MG tablet TAKE 1 TABLET BY MOUTH EVERY EVENING 02/09/18  Yes Rutherford Guys, MD  Loperamide HCl (IMODIUM PO) Take by mouth as needed.   Yes [provider]  metFORMIN (GLUCOPHAGE) 500 MG tablet  12/25/17  Yes [provider]  nadolol (CORGARD) 20 MG tablet Take 0.5 tablets (10 mg total) by mouth daily. 02/04/18  Yes Milus Banister, MD  omeprazole (PRILOSEC) 40 MG capsule Take 1 capsule (40 mg total) by mouth 2 (two) times daily. 02/03/18  Yes Levin Erp, PA  sitaGLIPtin (JANUVIA) 25 MG tablet Take 1 tablet (25 mg total) by mouth daily. 12/23/17  Yes Ferry Matthis, Ines Bloomer, MD  tenofovir (VIREAD) 300 MG tablet Take 300 mg by mouth daily.   Yes [provider]  furosemide (LASIX) 40 MG tablet Take 1 tablet (40 mg total) by mouth daily. 11/06/17 02/04/18  Milus Banister, MD  lactulose (CHRONULAC) 10 GM/15ML solution Take 45 mLs (30 g total) by mouth daily. Patient not taking: Reported on 02/19/2018  12/23/17   Esterwood, Amy S, PA-C  spironolactone (ALDACTONE) 100 MG tablet Take 1 tablet (100 mg total) by mouth daily. 11/06/17 02/04/18  Milus Banister, MD  sucralfate (CARAFATE) 1 GM/10ML suspension Take 10 mLs (1 g total) by mouth 4 (four) times daily. Take between meals and at bedtime. Patient not taking: Reported on 02/19/2018 12/23/17   Esterwood, Amy S, PA-C    No Known Allergies  Patient Active Problem List   Diagnosis Date Noted  . Pancytopenia ([place]) 12/03/2017  . Hyperglycemia 12/03/2017  . Cramps, extremity 12/03/2017  . Abnormal echocardiogram 12/03/2017  . New onset type 2 diabetes mellitus ([place]) 12/03/2017  . History of hepatitis B 12/03/2017  . Diarrhea   . 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 Heart murmur 08/01/2017  . Hepatitis B 02/17/2015  . Elevated bilirubin   . Liver cirrhosis secondary to NASH (nonalcoholic steatohepatitis) ([place]) 11/25/2014  . Thrombocytopenia ([place]) 11/25/2014  . History of TB (tuberculosis) 07/16/2010    Past Medical History:  Diagnosis Date  . Cirrhosis ([place])   . Diabetes mellitus ([place])   . Hepatitis B   . Lymphocytic colitis   . 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     Past Surgical History:  Procedure Laterality Date  . BIOPSY  10/23/2017   Procedure: BIOPSY;  Surgeon: Milus Banister, MD;  Location: [facility];  Service: Endoscopy;;  . COLONOSCOPY WITH  PROPOFOL N/A 10/23/2017   Procedure: COLONOSCOPY WITH PROPOFOL;  Surgeon: Milus Banister, MD;  Location: [facility];  Service: Endoscopy;  Laterality: N/A;  . COMPLEX WOUND CLOSURE Right 12/15/2015   Procedure: COMPLEX WOUND CLOSURE;  Surgeon: Leanora Cover, MD;  Location: [facility];  Service: Orthopedics;  Laterality: Right;  . ESOPHAGOGASTRODUODENOSCOPY (EGD) WITH PROPOFOL N/A 10/23/2017   Procedure: ESOPHAGOGASTRODUODENOSCOPY (EGD) WITH PROPOFOL;  Surgeon: Milus Banister, MD;  Location: [facility];  Service: Endoscopy;  Laterality: N/A;  . I&D  EXTREMITY Right 12/15/2015   Procedure: IRRIGATION AND DEBRIDEMENT AND REVISION AMPUTATION RIGHT RING FINGER;  Surgeon: Leanora Cover, MD;  Location: [facility];  Service: Orthopedics;  Laterality: Right;    Social History   Socioeconomic History  . Marital status: Married    Spouse name: Not on file  . Number of children: 4  . Years of education: Not on file  . Highest education level: Not on file  Occupational History  . 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 Financial resource strain: Not on file  . Food insecurity:    Worry: Not on file    Inability: Not on file  . Transportation needs:    Medical: Not on file    Non-medical: Not on file  Tobacco Use  . Smoking status: Former Smoker    Packs/day: 0.50    Years: 8.00    Pack years: 4.00    Types: Cigarettes    Last attempt to quit: 04/08/1990    Years since quitting: 27.8  . Smokeless tobacco: Never Used  Substance and Sexual Activity  . Alcohol use: No  . Drug use: No  . Sexual activity: Yes  Lifestyle  . Physical activity:    Days per week: Not on file    Minutes per session: Not on file  . Stress: Not on file  Relationships  . Social connections:    Talks on phone: Not on file    Gets together: Not on file    Attends religious service: Not on file    Active member of club or organization: Not on file    Attends meetings of clubs or organizations: Not on file    Relationship status: Not on file  . Intimate partner violence:    Fear of current or ex partner: Not on file    Emotionally abused: Not on file    Physically abused: Not on file    Forced sexual activity: Not on file  Other Topics Concern  . 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  Lives with wife and four children.     Family History  Problem Relation Age of Onset  . Colon cancer Neg Hx   . Esophageal cancer Neg Hx   . Pancreatic cancer Neg Hx   . Stomach cancer Neg Hx   . Liver disease Neg Hx        Review of Systems  Constitutional: Negative.  Negative for chills and fever.  HENT: Negative.  Negative for hearing loss and sore throat.   Eyes: Negative.  Negative for blurred vision and double vision.  Respiratory: Negative.  Negative for cough and shortness of breath.   Cardiovascular: Negative.  Negative for chest pain.  Gastrointestinal: Negative.  Negative for abdominal pain, diarrhea, nausea and vomiting.  Genitourinary: Negative.  Negative for dysuria and hematuria.  Musculoskeletal: Negative.  Negative for back pain, myalgias and neck pain.  Skin: Negative.  Negative for rash.  Neurological: Positive for dizziness.  Endo/Heme/Allergies: Negative.   All other systems reviewed and are negative.   Vitals:   02/19/18 1511  BP: (!) 86/55  Pulse: (!) 59  Resp: 16  Temp: 98 F (36.7 C)  SpO2: 99%    Physical Exam  Constitutional: He is oriented to person, place, and time. He appears well-developed.  Thin chronically ill looking.  HENT:  Head: Normocephalic and atraumatic.  Nose: Nose normal.  Mouth/Throat: Oropharynx is clear and moist.  Eyes: Pupils are equal, round, and reactive to light. Conjunctivae and EOM are normal.  Neck: Normal range of motion. Neck supple.  Cardiovascular: Normal rate, regular rhythm and normal heart sounds.  Pulmonary/Chest: Effort normal and breath sounds normal.  Abdominal: Soft. There is no tenderness.  Musculoskeletal: Normal range of motion.  Neurological: He is alert and oriented to person, place, and time. No sensory deficit. He exhibits normal muscle tone.  Skin: Skin is warm and dry. Capillary refill takes less than 2 seconds.  Psychiatric: He has a normal mood and affect. His behavior is normal.  Vitals reviewed.  A total of 40 minutes was spent in the room with the patient, greater than 50% of which was in counseling/coordination of care regarding chronic medical problems, etiology of hypotension, differential diagnosis,  patient reviewed, prognosis, work note, and need for follow-up.   ASSESSMENT & PLAN:   Patient Instructions    Stop taking furosemide (Lasix) because it is causing your blood pressure to go low. Hypotension As your heart beats, it forces blood through your body. This force is called blood pressure. If you have hypotension, you have low blood pressure. When your blood pressure is too low, you may not get enough blood to your brain. You may feel weak, feel light-headed, have a fast heartbeat, or even pass out (faint). Follow these instructions at home: Eating and drinking  Drink enough fluids to keep your pee (urine) clear or pale yellow.  Eat a healthy diet, and follow instructions from your doctor about eating or drinking restrictions. A healthy diet includes: ? Fresh fruits and vegetables. ? Whole grains. ? Low-fat (lean) meats. ? Low-fat dairy products.  Eat extra salt only as told. Do not add extra salt to your diet unless your doctor tells you to.  Eat small meals often.  Avoid standing up quickly after you eat. Medicines  Take over-the-counter and prescription medicines only as told by your doctor. ? Follow instructions from your doctor about changing how much you take (the dosage) of your medicines, if this applies. ? Do not stop or change your medicine on your own. General instructions  Wear compression stockings as told by your doctor.  Get up slowly from lying down or sitting.  Avoid hot showers and a lot of heat as told by your doctor.  Return to your normal activities as told by your doctor. Ask what activities are safe for you.  Do not use any products that contain nicotine or tobacco, such as cigarettes and e-cigarettes. If you need help quitting, ask your doctor.  Keep all follow-up visits as told by your doctor. This is important. Contact a doctor if:  You throw up (vomit).  You have watery poop (diarrhea).  You have a fever for more than 2-3  days.  You feel more thirsty than normal.  You feel weak and tired. Get help right away if:  You have chest pain.  You have a fast or irregular heartbeat.  You lose feeling (get numbness) in any part of your body.  You cannot move your arms or your legs.  You have trouble talking.  You get sweaty or feel light-headed.  You faint.  You have trouble breathing.  You have trouble staying awake.  You feel confused. This information is not intended to replace advice given to you by your health care provider. Make sure you discuss any questions you have with your health care provider. Document Released: 06/19/2009 Document Revised: 12/12/2015 Document Reviewed: 12/12/2015 Elsevier Interactive Patient Education  2017 [employer].    If you have lab work done today you will be contacted with your lab results within the next 2 weeks.  If you have not heard from [place] then please contact us. The fastest way to get your results is to register for My Chart.   IF you received an x-ray today, you will receive an invoice from [facility]. Please contact [facility] at [phone] with questions or concerns regarding your invoice.   IF you received labwork today, you will receive an invoice from [facility]. Please contact [facility] at [phone] with questions or concerns regarding your invoice.   Our billing staff will not be able to assist you with questions regarding bills from these companies.  You will be contacted with the lab results as soon as they are available. The fastest way to get your results is to activate your My Chart account. Instructions are located on the last page of this paperwork. If you have not heard from [place] regarding the results in 2 weeks, please contact this office.         Agustina Caroli, MD [facility]

## 2018-02-20 ENCOUNTER — Ambulatory Visit
Admission: RE | Admit: 2018-02-20 | Discharge: 2018-02-20 | Disposition: A | Discharge status: Home / Self Care | Payer: PRIVATE HEALTH INSURANCE | Source: Ambulatory Visit | Attending: Nurse Practitioner | Admitting: Nurse Practitioner

## 2018-02-20 DIAGNOSIS — B181 Chronic viral hepatitis B without delta-agent: Secondary | ICD-10-CM

## 2018-02-20 MED ORDER — IOPAMIDOL (ISOVUE-300) INJECTION 61%
100.0000 mL | Freq: Once | INTRAVENOUS | Status: AC | PRN
  Administered 2018-02-20: 75 mL via INTRAVENOUS

## 2018-02-27 ENCOUNTER — Encounter: Payer: PRIVATE HEALTH INSURANCE | Attending: Internal Medicine | Admitting: *Deleted

## 2018-02-27 ENCOUNTER — Encounter: Payer: Self-pay | Admitting: *Deleted

## 2018-02-27 ENCOUNTER — Telehealth: Payer: Self-pay | Admitting: Physician Assistant

## 2018-02-27 DIAGNOSIS — K7460 Unspecified cirrhosis of liver: Secondary | ICD-10-CM | POA: Insufficient documentation

## 2018-02-27 DIAGNOSIS — Z713 Dietary counseling and surveillance: Secondary | ICD-10-CM | POA: Insufficient documentation

## 2018-02-27 DIAGNOSIS — E119 Type 2 diabetes mellitus without complications: Secondary | ICD-10-CM | POA: Diagnosis not present

## 2018-02-27 NOTE — Telephone Encounter (Signed)
Pt needs prescription for prilosec with new dosage, pt states that [place] increase dose to two pills a day. He uses [facility] on [address].

## 2018-02-27 NOTE — Patient Instructions (Signed)
Plan:  Aim for 3 Carb Choices per meal (45 grams) +/- 1 either way  Aim for 0-2 Carbs per snack if hungry  Include lean protein in moderation with your meals and snacks Consider reading food labels for Total Carbohydrate of foods Consider  increasing your activity level by walking for 15-30 minutes daily as tolerated Continue checking BG 3 times per day  Contijnue taking medication as directed by MD

## 2018-02-27 NOTE — Telephone Encounter (Signed)
Patient has a years supply of refills on his prilosec. Left message he just needs to contact pharmacy for a refill.

## 2018-02-27 NOTE — Progress Notes (Signed)
Diabetes Self-Management Education  Visit Type: First/Initial  Appt. Start Time: 0830 Appt. End Time: 1000  02/27/2018  [name], identified by name and date of birth, is a 47 Jose.o. male with a diagnosis of Diabetes: Type 2. Patient speaks Leisure centre manager, interpreter states he speaks different dialect - R[place], and patient's daughter arranged to be here for the appointment so it was agreed to use the daughter today. Patient newly diagnosed,expresses fear over not knowing what to eat or drink. He is awaiting liver transplant. He has elementary school education. Diet history noted. 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   ASSESSMENT  Height 5' 2"  (1.575 m), weight 121 lb 9.6 oz (55.2 kg). Body mass index is 22.24 kg/m.  Diabetes Self-Management Education - 02/27/18 0854      Visit Information   Visit Type  First/Initial      Initial Visit   Diabetes Type  Type 2    Are you currently following a meal plan?  Yes    Are you taking your medications as prescribed?  Yes    Date Diagnosed  September, 2019      Health Coping   How would you rate your overall health?  Fair      Psychosocial Assessment   Patient Belief/Attitude about Diabetes  Afraid    Self-care barriers  English as a second language;Low literacy    Self-management support  Family    Other persons present  Patient;Family Member   daughter here to interpret, patient agrees   Patient Concerns  Nutrition/Meal planning;Glycemic Control    Special Needs  Simplified materials    Preferred Learning Style  Visual;Hands on    Learning Readiness  Change in progress    How often do you need to have someone help you when you read instructions, pamphlets, or other written materials from your doctor or pharmacy?  5 - Always    What is the last grade level you completed in school?  elementary school      Pre-Education Assessment   Patient understands the diabetes disease and  treatment process.  Needs Instruction    Patient understands incorporating nutritional management into lifestyle.  Needs Instruction    Patient undertands incorporating physical activity into lifestyle.  Needs Instruction    Patient understands using medications safely.  Needs Instruction    Patient understands monitoring blood glucose, interpreting and using results  Needs Instruction    Patient understands prevention, detection, and treatment of acute complications.  Needs Instruction    Patient understands prevention, detection, and treatment of chronic complications.  Needs Instruction    Patient understands how to develop strategies to address psychosocial issues.  Needs Instruction    Patient understands how to develop strategies to promote health/change behavior.  Needs Instruction      Complications   Last HgB A1C per patient/outside source  9.9 %    How often do you check your blood sugar?  3-4 times/day    Fasting Blood glucose range (mg/dL)  70-129;130-179    Postprandial Blood glucose range (mg/dL)  70-129;130-179    Number of hypoglycemic episodes per month  0    Have you had a dilated eye exam in the past 12 months?  Yes    Have you had a dental exam in the past 12 months?  No    Are you checking your feet?  Yes    How many days per week  are you checking your feet?  3      Dietary Intake   Breakfast  830: rice and water OR vegetables with rice OR occasionally meat with rice    Snack (morning)  fresh fruit    Lunch  1:30 rice with meat and occasionally vegetables    Snack (afternoon)  fresh fruit    Dinner  rice with vegetables and meat    Snack (evening)  fruit    Beverage(s)  water, used to drink coffee with 2 tsp sugar      Exercise   Exercise Type  ADL's      Patient Education   Previous Diabetes Education  No    Disease state   Factors that contribute to the development of diabetes;Explored patient's options for treatment of their diabetes    Nutrition management    Role of diet in the treatment of diabetes and the relationship between the three main macronutrients and blood glucose level;Carbohydrate counting;Reviewed blood glucose goals for pre and post meals and how to evaluate the patients' food intake on their blood glucose level.;Food label reading, portion sizes and measuring food.    Physical activity and exercise   Role of exercise on diabetes management, blood pressure control and cardiac health.    Monitoring  Identified appropriate SMBG and/or A1C goals.;Purpose and frequency of SMBG.      Individualized Goals (developed by patient)   Nutrition  General guidelines for healthy choices and portions discussed;Follow meal plan discussed    Physical Activity  15 minutes per day    Medications  take my medication as prescribed    Monitoring   test my blood glucose as discussed      Post-Education Assessment   Patient understands incorporating nutritional management into lifestyle.  Demonstrates understanding / competency      Outcomes   Expected Outcomes  Demonstrated interest in learning. Expect positive outcomes    Future DMSE  PRN    Program Status  Not Completed       Individualized Plan for Diabetes Self-Management Training:   Learning Objective:  Patient will have a greater understanding of diabetes self-management. Patient education plan is to attend individual and/or group sessions per assessed needs and concerns.   Plan:   Patient Instructions  Plan:  Aim for 3 Carb Choices per meal (45 grams) +/- 1 either way  Aim for 0-2 Carbs per snack if hungry  Include lean protein in moderation with your meals and snacks Consider reading food labels for Total Carbohydrate of foods Consider  increasing your activity level by walking for 15-30 minutes daily as tolerated Continue checking BG 3 times per day  Contijnue taking medication as directed by MD  Expected Outcomes:  Demonstrated interest in learning. Expect positive  outcomes  Education material provided: Food label handouts, A1C conversion sheet, Meal plan card and Carbohydrate counting sheet  If problems or questions, patient to contact team via:  Phone  Future DSME appointment: PRN, has multiple other appointments right now. Daughter and patient will call when ready to come back for more education.

## 2018-03-01 ENCOUNTER — Other Ambulatory Visit: Payer: Self-pay | Admitting: Gastroenterology

## 2018-03-02 ENCOUNTER — Telehealth: Payer: Self-pay | Admitting: Physician Assistant

## 2018-03-02 ENCOUNTER — Inpatient Hospital Stay (HOSPITAL_COMMUNITY): Payer: PRIVATE HEALTH INSURANCE

## 2018-03-02 ENCOUNTER — Emergency Department (HOSPITAL_COMMUNITY): Payer: PRIVATE HEALTH INSURANCE

## 2018-03-02 ENCOUNTER — Other Ambulatory Visit: Payer: Self-pay

## 2018-03-02 ENCOUNTER — Encounter (HOSPITAL_COMMUNITY): Payer: Self-pay

## 2018-03-02 ENCOUNTER — Inpatient Hospital Stay (HOSPITAL_COMMUNITY)
Admission: EM | Admit: 2018-03-02 | Discharge: 2018-03-04 | DRG: 392 | Disposition: A | Discharge status: Home / Self Care | Payer: PRIVATE HEALTH INSURANCE | Attending: Internal Medicine | Admitting: Internal Medicine

## 2018-03-02 DIAGNOSIS — K7581 Nonalcoholic steatohepatitis (NASH): Secondary | ICD-10-CM | POA: Diagnosis present

## 2018-03-02 DIAGNOSIS — D731 Hypersplenism: Secondary | ICD-10-CM

## 2018-03-02 DIAGNOSIS — Z87891 Personal history of nicotine dependence: Secondary | ICD-10-CM

## 2018-03-02 DIAGNOSIS — Z8611 Personal history of tuberculosis: Secondary | ICD-10-CM

## 2018-03-02 DIAGNOSIS — J302 Other seasonal allergic rhinitis: Secondary | ICD-10-CM

## 2018-03-02 DIAGNOSIS — R1013 Epigastric pain: Secondary | ICD-10-CM | POA: Diagnosis present

## 2018-03-02 DIAGNOSIS — R198 Other specified symptoms and signs involving the digestive system and abdomen: Secondary | ICD-10-CM | POA: Diagnosis not present

## 2018-03-02 DIAGNOSIS — Z7682 Awaiting organ transplant status: Secondary | ICD-10-CM | POA: Diagnosis not present

## 2018-03-02 DIAGNOSIS — R188 Other ascites: Secondary | ICD-10-CM | POA: Diagnosis present

## 2018-03-02 DIAGNOSIS — E119 Type 2 diabetes mellitus without complications: Secondary | ICD-10-CM

## 2018-03-02 DIAGNOSIS — E440 Moderate protein-calorie malnutrition: Secondary | ICD-10-CM | POA: Diagnosis present

## 2018-03-02 DIAGNOSIS — K7460 Unspecified cirrhosis of liver: Secondary | ICD-10-CM

## 2018-03-02 DIAGNOSIS — D6959 Other secondary thrombocytopenia: Secondary | ICD-10-CM | POA: Diagnosis present

## 2018-03-02 DIAGNOSIS — I8500 Esophageal varices without bleeding: Secondary | ICD-10-CM

## 2018-03-02 DIAGNOSIS — Z79899 Other long term (current) drug therapy: Secondary | ICD-10-CM | POA: Diagnosis not present

## 2018-03-02 DIAGNOSIS — K3189 Other diseases of stomach and duodenum: Secondary | ICD-10-CM | POA: Diagnosis present

## 2018-03-02 DIAGNOSIS — B181 Chronic viral hepatitis B without delta-agent: Secondary | ICD-10-CM | POA: Diagnosis present

## 2018-03-02 DIAGNOSIS — K2960 Other gastritis without bleeding: Secondary | ICD-10-CM | POA: Diagnosis present

## 2018-03-02 DIAGNOSIS — Z8719 Personal history of other diseases of the digestive system: Secondary | ICD-10-CM

## 2018-03-02 DIAGNOSIS — K648 Other hemorrhoids: Secondary | ICD-10-CM | POA: Diagnosis present

## 2018-03-02 DIAGNOSIS — K08409 Partial loss of teeth, unspecified cause, unspecified class: Secondary | ICD-10-CM | POA: Diagnosis present

## 2018-03-02 DIAGNOSIS — K253 Acute gastric ulcer without hemorrhage or perforation: Secondary | ICD-10-CM | POA: Diagnosis not present

## 2018-03-02 DIAGNOSIS — I8510 Secondary esophageal varices without bleeding: Secondary | ICD-10-CM | POA: Diagnosis present

## 2018-03-02 DIAGNOSIS — K2900 Acute gastritis without bleeding: Secondary | ICD-10-CM | POA: Diagnosis present

## 2018-03-02 DIAGNOSIS — D649 Anemia, unspecified: Secondary | ICD-10-CM

## 2018-03-02 DIAGNOSIS — D61818 Other pancytopenia: Secondary | ICD-10-CM

## 2018-03-02 DIAGNOSIS — K922 Gastrointestinal hemorrhage, unspecified: Secondary | ICD-10-CM

## 2018-03-02 DIAGNOSIS — R42 Dizziness and giddiness: Secondary | ICD-10-CM

## 2018-03-02 DIAGNOSIS — Z794 Long term (current) use of insulin: Secondary | ICD-10-CM

## 2018-03-02 DIAGNOSIS — K766 Portal hypertension: Secondary | ICD-10-CM | POA: Diagnosis present

## 2018-03-02 LAB — COMPREHENSIVE METABOLIC PANEL
ALBUMIN: 2.5 g/dL — AB (ref 3.5–5.0)
ALT: 30 U/L (ref 0–44)
ANION GAP: 5 (ref 5–15)
AST: 45 U/L — ABNORMAL HIGH (ref 15–41)
Alkaline Phosphatase: 80 U/L (ref 38–126)
BUN: 13 mg/dL (ref 6–20)
CO2: 24 mmol/L (ref 22–32)
Calcium: 8.2 mg/dL — ABNORMAL LOW (ref 8.9–10.3)
Chloride: 107 mmol/L (ref 98–111)
Creatinine, Ser: 1.1 mg/dL (ref 0.61–1.24)
GFR calc Af Amer: >60 mL/min (ref >60)
GFR calc non Af Amer: >60 mL/min (ref >60)
GLUCOSE: 81 mg/dL (ref 70–99)
POTASSIUM: 3.8 mmol/L (ref 3.5–5.1)
Sodium: 136 mmol/L (ref 135–145)
Total Bilirubin: 1.7 mg/dL — ABNORMAL HIGH (ref 0.3–1.2)
Total Protein: 6.1 g/dL — ABNORMAL LOW (ref 6.5–8.1)

## 2018-03-02 LAB — CBC WITH DIFFERENTIAL/PLATELET
Abs Immature Granulocytes: 0.02 10*3/uL (ref 0.00–0.07)
BASOS ABS: 0 10*3/uL (ref 0.0–0.1)
BASOS PCT: 1 %
EOS ABS: 0.2 10*3/uL (ref 0.0–0.5)
EOS PCT: 5 %
HEMATOCRIT: 32.8 % — AB (ref 39.0–52.0)
Hemoglobin: 10.8 g/dL — ABNORMAL LOW (ref 13.0–17.0)
Immature Granulocytes: 1 %
Lymphocytes Relative: 37 %
Lymphs Abs: 1.2 10*3/uL (ref 0.7–4.0)
MCH: 33.1 pg (ref 26.0–34.0)
MCHC: 32.9 g/dL (ref 30.0–36.0)
MCV: 100.6 fL — ABNORMAL HIGH (ref 80.0–100.0)
Monocytes Absolute: 0.4 10*3/uL (ref 0.1–1.0)
Monocytes Relative: 12 %
Neutro Abs: 1.5 10*3/uL — ABNORMAL LOW (ref 1.7–7.7)
Neutrophils Relative %: 44 %
PLATELETS: 25 10*3/uL — AB (ref 150–400)
RBC: 3.26 MIL/uL — AB (ref 4.22–5.81)
RDW: 14.5 % (ref 11.5–15.5)
WBC: 3.3 10*3/uL — AB (ref 4.0–10.5)
nRBC: 0 % (ref 0.0–0.2)

## 2018-03-02 LAB — TYPE AND SCREEN
ABO/RH(D): B POS
ANTIBODY SCREEN: NEGATIVE

## 2018-03-02 LAB — POC OCCULT BLOOD, ED: Fecal Occult Bld: POSITIVE — AB

## 2018-03-02 LAB — LIPASE, BLOOD: LIPASE: 53 U/L — AB (ref 11–51)

## 2018-03-02 LAB — PROTIME-INR
INR: 1.57
Prothrombin Time: 18.6 seconds — ABNORMAL HIGH (ref 11.4–15.2)

## 2018-03-02 LAB — APTT: APTT: 43 s — AB (ref 24–36)

## 2018-03-02 LAB — GLUCOSE, CAPILLARY
Glucose-Capillary: 85 mg/dL (ref 70–99)
Glucose-Capillary: 204 mg/dL — ABNORMAL HIGH (ref 70–99)

## 2018-03-02 LAB — ABO/RH: ABO/RH(D): B POS

## 2018-03-02 MED ORDER — SODIUM CHLORIDE 0.9 % IV SOLN
8.0000 mg/h | INTRAVENOUS | Status: DC
  Filled 2018-03-02: qty 80

## 2018-03-02 MED ORDER — ONDANSETRON HCL 4 MG/2ML IJ SOLN
4.0000 mg | Freq: Once | INTRAMUSCULAR | Status: AC
  Administered 2018-03-02: 4 mg via INTRAVENOUS
  Filled 2018-03-02: qty 2

## 2018-03-02 MED ORDER — MORPHINE SULFATE (PF) 2 MG/ML IV SOLN
INTRAVENOUS | Status: AC
  Administered 2018-03-02: 2.2 mg via INTRAVENOUS
  Filled 2018-03-02: qty 2

## 2018-03-02 MED ORDER — NADOLOL 20 MG PO TABS: 20.0000 mg | ORAL_TABLET | Freq: Every day | ORAL | Status: DC

## 2018-03-02 MED ORDER — TENOFOVIR DISOPROXIL FUMARATE 300 MG PO TABS
300.0000 mg | ORAL_TABLET | Freq: Every day | ORAL | Status: DC
  Administered 2018-03-02 – 2018-03-04 (×2): 300 mg via ORAL
  Filled 2018-03-02 (×3): qty 1

## 2018-03-02 MED ORDER — ACETAMINOPHEN 650 MG RE SUPP: 650.0000 mg | Freq: Four times a day (QID) | RECTAL | Status: DC | PRN

## 2018-03-02 MED ORDER — TECHNETIUM TC 99M MEBROFENIN IV KIT
5.3000 | PACK | Freq: Once | INTRAVENOUS | Status: AC | PRN
  Administered 2018-03-02: 5.3 via INTRAVENOUS

## 2018-03-02 MED ORDER — SODIUM CHLORIDE 0.9 % IV SOLN
80.0000 mg | Freq: Once | INTRAVENOUS | Status: AC
  Administered 2018-03-02: 80 mg via INTRAVENOUS
  Filled 2018-03-02: qty 80

## 2018-03-02 MED ORDER — INSULIN GLARGINE 100 UNIT/ML ~~LOC~~ SOLN
9.0000 [IU] | Freq: Every day | SUBCUTANEOUS | Status: DC
  Administered 2018-03-02 – 2018-03-03 (×2): 9 [IU] via SUBCUTANEOUS
  Filled 2018-03-02 (×3): qty 0.09

## 2018-03-02 MED ORDER — SPIRONOLACTONE 25 MG PO TABS
100.0000 mg | ORAL_TABLET | Freq: Every day | ORAL | Status: DC
  Administered 2018-03-04: 100 mg via ORAL
  Filled 2018-03-02 (×3): qty 4
  Filled 2018-03-02: qty 1

## 2018-03-02 MED ORDER — SUCRALFATE 1 GM/10ML PO SUSP
1.0000 g | Freq: Once | ORAL | Status: AC
  Administered 2018-03-02: 1 g via ORAL
  Filled 2018-03-02: qty 10

## 2018-03-02 MED ORDER — INSULIN ASPART 100 UNIT/ML ~~LOC~~ SOLN
0.0000 [IU] | Freq: Three times a day (TID) | SUBCUTANEOUS | Status: DC
  Administered 2018-03-03: 2 [IU] via SUBCUTANEOUS
  Administered 2018-03-04: 1 [IU] via SUBCUTANEOUS

## 2018-03-02 MED ORDER — PANTOPRAZOLE SODIUM 40 MG PO TBEC
40.0000 mg | DELAYED_RELEASE_TABLET | Freq: Two times a day (BID) | ORAL | Status: DC
  Administered 2018-03-03 – 2018-03-04 (×2): 40 mg via ORAL
  Filled 2018-03-02 (×3): qty 1

## 2018-03-02 MED ORDER — NADOLOL 20 MG PO TABS
10.0000 mg | ORAL_TABLET | Freq: Every day | ORAL | Status: DC
  Administered 2018-03-04: 10 mg via ORAL
  Filled 2018-03-02 (×2): qty 1

## 2018-03-02 MED ORDER — FENTANYL CITRATE (PF) 100 MCG/2ML IJ SOLN
25.0000 ug | Freq: Once | INTRAMUSCULAR | Status: AC
  Administered 2018-03-02: 25 ug via INTRAVENOUS
  Filled 2018-03-02: qty 2

## 2018-03-02 MED ORDER — NADOLOL 20 MG PO TABS: 10.0000 mg | ORAL_TABLET | Freq: Every day | ORAL | Status: DC

## 2018-03-02 MED ORDER — OCTREOTIDE LOAD VIA INFUSION: 50.0000 ug | Freq: Once | INTRAVENOUS | Status: DC

## 2018-03-02 MED ORDER — OCTREOTIDE ACETATE 50 MCG/ML IJ SOLN
50.0000 ug | Freq: Once | INTRAMUSCULAR | Status: AC
  Administered 2018-03-02: 50 ug via INTRAVENOUS
  Filled 2018-03-02: qty 1

## 2018-03-02 MED ORDER — CETIRIZINE HCL 10 MG PO TABS
10.0000 mg | ORAL_TABLET | Freq: Every evening | ORAL | Status: DC
  Administered 2018-03-02 – 2018-03-03 (×2): 10 mg via ORAL
  Filled 2018-03-02 (×3): qty 1

## 2018-03-02 MED ORDER — MORPHINE SULFATE (PF) 4 MG/ML IV SOLN
2.2000 mg | Freq: Once | INTRAVENOUS | Status: AC
  Administered 2018-03-02: 2.2 mg via INTRAVENOUS
  Filled 2018-03-02: qty 1

## 2018-03-02 MED ORDER — ACETAMINOPHEN 325 MG PO TABS
650.0000 mg | ORAL_TABLET | Freq: Four times a day (QID) | ORAL | Status: DC | PRN
  Administered 2018-03-02 – 2018-03-03 (×2): 650 mg via ORAL
  Filled 2018-03-02 (×2): qty 2

## 2018-03-02 MED ORDER — SUCRALFATE 1 GM/10ML PO SUSP
1.0000 g | Freq: Four times a day (QID) | ORAL | Status: DC
  Administered 2018-03-02 – 2018-03-04 (×6): 1 g via ORAL
  Filled 2018-03-02 (×7): qty 10

## 2018-03-02 MED ORDER — PANTOPRAZOLE SODIUM 40 MG PO TBEC: 40.0000 mg | DELAYED_RELEASE_TABLET | Freq: Every day | ORAL | Status: DC

## 2018-03-02 MED ORDER — SENNOSIDES-DOCUSATE SODIUM 8.6-50 MG PO TABS: 1.0000 | ORAL_TABLET | Freq: Every evening | ORAL | Status: DC | PRN

## 2018-03-02 MED ORDER — FENTANYL CITRATE (PF) 100 MCG/2ML IJ SOLN
50.0000 ug | Freq: Once | INTRAMUSCULAR | Status: AC
  Administered 2018-03-02: 50 ug via INTRAVENOUS
  Filled 2018-03-02: qty 2

## 2018-03-02 NOTE — ED Notes (Signed)
Patient ambulatory to bathroom with steady gait at this time 

## 2018-03-02 NOTE — Telephone Encounter (Signed)
I left a message advising the caregiver to speak with the nurse at the ED with any questions.  They will contact GI as needed for consult

## 2018-03-02 NOTE — ED Provider Notes (Signed)
[facility] Provider Note   CSN: [id number] Arrival date & time: 03/02/18  0440     History   Chief Complaint Chief Complaint  Patient presents with  . Chest Pain    epigastric    HPI Jose Snow is a 47 y.o. male.  The history is provided by the patient and a relative.  Abdominal Pain   This is a new problem. The current episode started 3 to 5 hours ago. The problem occurs constantly. The problem has been gradually worsening. The pain is located in the epigastric region. The pain is moderate. Pertinent negatives include fever, hematochezia, nausea and vomiting. The symptoms are aggravated by palpation. Nothing relieves the symptoms.  Patient is [place]  and interpreter not available.  Daughter at bedside is able to provide history PT presents for upper abdominal pain as he has had similar episodes before.  He had a similar episode a few days ago, and has had it in the past.  He had been on omeprazole before with some relief.  Currently denies any chest pain or shortness of breath.  No vomiting.  No bloody stools. Past Medical History:  Diagnosis Date  . Cirrhosis ([place])   . Diabetes mellitus ([place])   . Hepatitis B   . Lymphocytic colitis   . 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     Patient Active Problem List   Diagnosis Date Noted  . Pancytopenia ([place]) 12/03/2017  . Hyperglycemia 12/03/2017  . Cramps, extremity 12/03/2017  . Abnormal echocardiogram 12/03/2017  . New onset type 2 diabetes mellitus ([place]) 12/03/2017  . History of hepatitis B 12/03/2017  . Diarrhea   . Esophageal varices without bleeding ([place])   . Heart murmur 08/01/2017  . Hepatitis B 02/17/2015  . Elevated bilirubin   . Liver cirrhosis secondary to NASH (nonalcoholic steatohepatitis) ([place]) 11/25/2014  . Thrombocytopenia ([place]) 11/25/2014  . History of TB (tuberculosis) 07/16/2010    Past Surgical History:  Procedure Laterality Date  .  BIOPSY  10/23/2017   Procedure: BIOPSY;  Surgeon: Milus Banister, MD;  Location: [facility];  Service: Endoscopy;;  . COLONOSCOPY WITH PROPOFOL N/A 10/23/2017   Procedure: COLONOSCOPY WITH PROPOFOL;  Surgeon: Milus Banister, MD;  Location: [facility];  Service: Endoscopy;  Laterality: N/A;  . COMPLEX WOUND CLOSURE Right 12/15/2015   Procedure: COMPLEX WOUND CLOSURE;  Surgeon: Leanora Cover, MD;  Location: [facility];  Service: Orthopedics;  Laterality: Right;  . ESOPHAGOGASTRODUODENOSCOPY (EGD) WITH PROPOFOL N/A 10/23/2017   Procedure: ESOPHAGOGASTRODUODENOSCOPY (EGD) WITH PROPOFOL;  Surgeon: Milus Banister, MD;  [facility];  Service: Endoscopy;  Laterality: N/A;  . I&D EXTREMITY Right 12/15/2015   Procedure: IRRIGATION AND DEBRIDEMENT AND REVISION AMPUTATION RIGHT RING FINGER;  Surgeon: Leanora Cover, MD;  Location: [facility];  Service: Orthopedics;  Laterality: Right;        Home Medications    Prior to Admission medications   Medication Sig Start Date End Date Taking? Authorizing Provider  furosemide (LASIX) 40 MG tablet Take 1 tablet (40 mg total) by mouth daily. 11/06/17 02/04/18  Milus Banister, MD  insulin glargine (LANTUS) 100 UNIT/ML injection Inject 9 Units into the skin daily.    [provider]  lactulose (CHRONULAC) 10 GM/15ML solution Take 45 mLs (30 g total) by mouth daily. Patient not taking: Reported on 02/19/2018 12/23/17   Esterwood, Amy S, PA-C  levocetirizine (XYZAL) 5 MG tablet TAKE 1 TABLET BY MOUTH  EVERY EVENING 02/09/18   Rutherford Guys, MD  Loperamide HCl (IMODIUM PO) Take by mouth as needed.    [provider]  metFORMIN (GLUCOPHAGE) 500 MG tablet  12/25/17   [provider]  nadolol (CORGARD) 20 MG tablet Take 0.5 tablets (10 mg total) by mouth daily. 02/04/18   Milus Banister, MD  omeprazole (PRILOSEC) 40 MG capsule Take 1 capsule (40 mg total) by mouth 2 (two) times daily. 02/03/18   Levin Erp, PA  sitaGLIPtin  (JANUVIA) 25 MG tablet Take 1 tablet (25 mg total) by mouth daily. 12/23/17   Horald Pollen, MD  spironolactone (ALDACTONE) 100 MG tablet Take 1 tablet (100 mg total) by mouth daily. 11/06/17 02/04/18  Milus Banister, MD  sucralfate (CARAFATE) 1 GM/10ML suspension Take 10 mLs (1 g total) by mouth 4 (four) times daily. Take between meals and at bedtime. Patient not taking: Reported on 02/19/2018 12/23/17   Esterwood, Amy S, PA-C  tenofovir (VIREAD) 300 MG tablet Take 300 mg by mouth daily.    [provider]    Family History Family History  Problem Relation Age of Onset  . Colon cancer Neg Hx   . Esophageal cancer Neg Hx   . Pancreatic cancer Neg Hx   . Stomach cancer Neg Hx   . Liver disease Neg Hx     Social History Social History   Tobacco Use  . Smoking status: Former Smoker    Packs/day: 0.50    Years: 8.00    Pack years: 4.00    Types: Cigarettes    Last attempt to quit: 04/08/1990    Years since quitting: 27.9  . Smokeless tobacco: Never Used  Substance Use Topics  . Alcohol use: No  . Drug use: No     Allergies   Patient has no known allergies.   Review of Systems Review of Systems  Constitutional: Negative for fever.  Cardiovascular: Negative for chest pain.  Gastrointestinal: Positive for abdominal pain. Negative for blood in stool, hematochezia, nausea and vomiting.  All other systems reviewed and are negative.    Physical Exam Updated Vital Signs BP (!) 101/46   Pulse (!) 59   Temp 97.9 F (36.6 C) (Oral)   Resp 16   SpO2 97%   Physical Exam CONSTITUTIONAL: Patient is thin appearing appears older than stated age HEAD: Normocephalic/atraumatic EYES: EOMI/PERRL ENMT: Mucous membranes moist NECK: supple no meningeal signs SPINE/BACK:entire spine nontender CV: S1/S2 noted, no murmurs/rubs/gallops noted LUNGS: Crackles noted left lung, no apparent distress ABDOMEN: soft, moderate epigastric tenderness, no rebound or guarding, bowel  sounds noted throughout abdomen GU:no cva tenderness NEURO: Pt is awake/alert/appropriate, moves all extremitiesx4.  No facial droop.   EXTREMITIES: pulses normal/equal, full ROM SKIN: warm, color normal PSYCH: no abnormalities of mood noted, alert and oriented to situation   ED Treatments / Results  Labs (all labs ordered are listed, but only abnormal results are displayed) Labs Reviewed  CBC WITH DIFFERENTIAL/PLATELET - Abnormal; Notable for the following components:      Result Value   WBC 3.3 (*)    RBC 3.26 (*)    Hemoglobin 10.8 (*)    HCT 32.8 (*)    MCV 100.6 (*)    All other components within normal limits  COMPREHENSIVE METABOLIC PANEL - Abnormal; Notable for the following components:   Calcium 8.2 (*)    Total Protein 6.1 (*)    Albumin 2.5 (*)    AST 45 (*)  Total Bilirubin 1.7 (*)    All other components within normal limits  LIPASE, BLOOD - Abnormal; Notable for the following components:   Lipase 53 (*)    All other components within normal limits    EKG EKG Interpretation  Date/Time:  Monday March 02 2018 04:53:22 EST Ventricular Rate:  61 PR Interval:    QRS Duration: 96 QT Interval:  469 QTC Calculation: 473 R Axis:   77 Text Interpretation:  Sinus rhythm RSR' in V1 or V2, probably normal variant Interpretation limited secondary to artifact Confirmed by Ripley Fraise [phone]) on 03/02/2018 5:05:55 AM   Radiology No results found.  Procedures Procedures    Medications Ordered in ED Medications  ondansetron (ZOFRAN) injection 4 mg (4 mg Intravenous Given 03/02/18 0515)  sucralfate (CARAFATE) 1 GM/10ML suspension 1 g (1 g Oral Given 03/02/18 0524)  fentaNYL (SUBLIMAZE) injection 50 mcg (50 mcg Intravenous Given 03/02/18 0616)     Initial Impression / Assessment and Plan / ED Course  I have reviewed the triage vital signs and the nursing notes.  Pertinent labs & imaging results that were available during my care of the patient were  reviewed by me and considered in my medical decision making (see chart for details).     5:16 AM Patient with complicated medical history including nonalcoholic cirrhosis presenting with epigastric abdominal pain.  Per family, he has had this on and off for a while.  He is focally tender in his abdomen.  I do not feel this is a cardiac process at this time. He does have crackles on exam, but apparently has chronic lung disease on previous imaging.  There is no hypoxia noted, defer imaging for now. He has been given Carafate in the past per gastroenterology, will start with this. 7:34 AM Pt with continued pain.  He has worsened pain in epigastric region, but also has diffuse tenderness.  He is a very difficult historian.   Labs near baseline He had CT imaging several weeks ago but this appears worse than normal Will plan for Ct imaging without contrast to avoid further IV contrast exposure  Plan at signout to dr Lita Mains, f/u on CT imaging He may need to go back on PPI if he is discharged  Final Clinical Impressions(s) / ED Diagnoses   Final diagnoses:  None    ED Discharge Orders    None       Ripley Fraise, MD 03/02/18 [phone]

## 2018-03-02 NOTE — ED Provider Notes (Signed)
CT without acute findings.  Patient does have a significant drop in his hemoglobin over the past 2 months.  Rectal exam with brown stool.  Hemoccult positive.  Discussed with gastroenterology who will consult on patient.  Internal medicine will admit.  Patient does appear to have a baseline blood pressure with systolic in the low 703J.  He has been typed and screened.    Julianne Rice, MD 03/02/18 1101

## 2018-03-02 NOTE — ED Notes (Signed)
Patient transported to CT 

## 2018-03-02 NOTE — ED Triage Notes (Addendum)
Patient c/o CP (epigastric).This has been an ongoing issue with it and was written an RX for Omeprazole for his heartburn but he hast taken any yet. Was given 13mg Fentanyl and 324 asa. 18ga LAC inserted by EMS

## 2018-03-02 NOTE — H&P (View-Only) (Signed)
[facility] Consult: 10:18 AM 03/02/2018  LOS: 0 days    Referring Provider: Dr Lita Mains in ED  Primary Care Physician:  Horald Pollen, MD Primary Gastroenterologist:  [name]  Hematologist: Dr Irene Limbo Daughter, translator Brai (prononunced bry) H.  [phone]   Reason for Consultation:  Epigastric pain and anemia in cirrhotic.     HPI: Jose Snow is a 47 y.o. male.  PMH Hep B, on Tenofovir since 08/2017, followed by ID clinic.  Cirrhosis. Lymphocytic colitis.  TB ( treated).  IDDM.  Pancytopenia.  S/p bone marrow bx 08/2017.  Dr Irene Limbo feels cytopenia likely due to cirrhosis/hypersplenism/chronic Hep B infection.    10/2017 EGD for variceal screening.  Mild portal gastropathy, small to medium distal esophageal varices.  Started on Nadolol 20 mg daily post procedure.   Takes PPI and Carafate.   10/2017 Colonoscopy for chronic diarrhea.  Internal hemorrhoids.  Random bxs of grossly normal mucosa revealed lymphocytic colitis.  Budesonide initiated but ultimately discontinued (? Due to hyperglycemia).  Has been taking Lactulose for swing to constipation.   10/2017 ultrasound abdomen: Cirrhosis.  Splenomegaly.  GB sludge, no cholelithiasis.  Non-specific GB wall thickening.    At last visit with liver transplant MD, has "a longer work-up" before they are able to do anything. Labs improving due to antiviral temofovir. At least 3 weeks of dizziness. Dose of Nadolol decreased to 10 mg daily on 10/29 after GI OV. Also underwent CT for c/o epigastric pain (see below). He takes Carafate, Omeprazole.     Lasix discontinued by PMD 11/14 due to hypotension.     02/20/18 CT abdomen with contrast: cirrhosis.  Splenomegaly with enlargement of the portal vein and venous collaterals, consistent with portal venous  hypertension.  No ascites.  Pulmonary granulomas.    Epigastric pain intermittent, not triggered by po.  Pain is worse if he lays on his right side and relieve if he lays on his back or left side.  No N/V or anorexia, no diarrhea or bloody stool, no radiation to chest or back.  An episode of pain lasting a few hours on Saturday, resolved.  Recurrent more severe pain 3 AM today.   Hgb 10.8, baselilne in 11/2017 was 10 to 11 and was up to 14.9 on 12/23/17.  MCV 100 (as high as 103 in 12/2017.   Contd leukopenia (currently slightly improved), thrombocytopenia (remains in historic range).   LFTs with  T bili 1.7 (up to 4 on 12/23/17)   Alk phos 80 (was 105)   AST/ALT 45/30 (was 37/58) Lipase is 53 Renal fx not deranged.   INR 1.5 FOBT + brown stool on rectal exam.   03/02/18 CT abd pelvis: Advanced cirrhosis.  No liver mass on noncontrast CT.  New small volume perihepatic ascites.   Stable moderate splenomegaly and moderate paraumbilical varices.  No bowel obstruction or acute bowel inflammation.    Past Medical History:  Diagnosis Date  . Cirrhosis ([place])   . Diabetes mellitus ([place])   . Hepatitis B   . Lymphocytic  colitis   . 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     Past Surgical History:  Procedure Laterality Date  . BIOPSY  10/23/2017   Procedure: BIOPSY;  Surgeon: Milus Banister, MD;  Location: [facility];  Service: Endoscopy;;  . COLONOSCOPY WITH PROPOFOL N/A 10/23/2017   Procedure: COLONOSCOPY WITH PROPOFOL;  Surgeon: Milus Banister, MD;  Location: [facility];  Service: Endoscopy;  Laterality: N/A;  . COMPLEX WOUND CLOSURE Right 12/15/2015   Procedure: COMPLEX WOUND CLOSURE;  Surgeon: Leanora Cover, MD;  Location: [facility];  Service: Orthopedics;  Laterality: Right;  . ESOPHAGOGASTRODUODENOSCOPY (EGD) WITH PROPOFOL N/A 10/23/2017   Procedure: ESOPHAGOGASTRODUODENOSCOPY (EGD) WITH PROPOFOL;  Surgeon: Milus Banister, MD;  Location: [facility];  Service: Endoscopy;   Laterality: N/A;  . I&D EXTREMITY Right 12/15/2015   Procedure: IRRIGATION AND DEBRIDEMENT AND REVISION AMPUTATION RIGHT RING FINGER;  Surgeon: Leanora Cover, MD;  Location: [facility];  Service: Orthopedics;  Laterality: Right;    Prior to Admission medications   Medication Sig Start Date End Date Taking? Authorizing Provider  insulin glargine (LANTUS) 100 UNIT/ML injection Inject 9 Units into the skin daily.   Yes [provider]  levocetirizine (XYZAL) 5 MG tablet TAKE 1 TABLET BY MOUTH EVERY EVENING Patient taking differently: Take 5 mg by mouth every evening.  02/09/18  Yes Rutherford Guys, MD  nadolol (CORGARD) 20 MG tablet Take 0.5 tablets (10 mg total) by mouth daily. Patient taking differently: Take 20 mg by mouth daily.  02/04/18  Yes Milus Banister, MD  omeprazole (PRILOSEC) 40 MG capsule Take 1 capsule (40 mg total) by mouth 2 (two) times daily. 02/03/18  Yes Levin Erp, PA  sitaGLIPtin (JANUVIA) 25 MG tablet Take 1 tablet (25 mg total) by mouth daily. 12/23/17  Yes Sagardia, Ines Bloomer, MD  spironolactone (ALDACTONE) 100 MG tablet Take 1 tablet (100 mg total) by mouth daily. 11/06/17 03/02/18 Yes Milus Banister, MD  sucralfate (CARAFATE) 1 GM/10ML suspension Take 10 mLs (1 g total) by mouth 4 (four) times daily. Take between meals and at bedtime. 12/23/17  Yes Esterwood, Amy S, PA-C  tenofovir (VIREAD) 300 MG tablet Take 300 mg by mouth daily.   Yes [provider]  furosemide (LASIX) 40 MG tablet Take 1 tablet (40 mg total) by mouth daily. Patient not taking: Reported on 03/02/2018 11/06/17 02/04/18  Milus Banister, MD  lactulose (CHRONULAC) 10 GM/15ML solution Take 45 mLs (30 g total) by mouth daily. Patient not taking: Reported on 02/19/2018 12/23/17   Esterwood, Amy S, PA-C    Scheduled Meds:  Infusions: . pantoprazole (PROTONIX) IVPB     PRN Meds:    Allergies as of 03/02/2018  . (No Known Allergies)    Family History  Problem Relation Age  of Onset  . Colon cancer Neg Hx   . Esophageal cancer Neg Hx   . Pancreatic cancer Neg Hx   . Stomach cancer Neg Hx   . Liver disease Neg Hx     Social History   Socioeconomic History  . Marital status: Married    Spouse name: Not on file  . Number of children: 4  . Years of education: Not on file  . Highest education level: Not on file  Occupational History  . 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 Financial resource strain: Not on file  . Food insecurity:    Worry: Not  on file    Inability: Not on file  . Transportation needs:    Medical: Not on file    Non-medical: Not on file  Tobacco Use  . Smoking status: Former Smoker    Packs/day: 0.50    Years: 8.00    Pack years: 4.00    Types: Cigarettes    Last attempt to quit: 04/08/1990    Years since quitting: 27.9  . Smokeless tobacco: Never Used  Substance and Sexual Activity  . Alcohol use: No  . Drug use: No  . Sexual activity: Yes  Lifestyle  . Physical activity:    Days per week: Not on file    Minutes per session: Not on file  . Stress: Not on file  Relationships  . Social connections:    Talks on phone: Not on file    Gets together: Not on file    Attends religious service: Not on file    Active member of club or organization: Not on file    Attends meetings of clubs or organizations: Not on file    Relationship status: Not on file  . Intimate partner violence:    Fear of current or ex partner: Not on file    Emotionally abused: Not on file    Physically abused: Not on file    Forced sexual activity: Not on file  Other Topics Concern  . 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  Lives with wife and four children.     REVIEW OF SYSTEMS: Constitutional:  Weakness for a few days ENT:  No nose bleeds Pulm:  No cough or dyspnea.   CV:  No palpitations, no LE edema.  GU:  No hematuria, no frequency GI:  Per HPI Heme:  No unusual bleeding.  Bruises  at site of insulin injection on belly Transfusions:  Platelets in 11/2017 Neuro:  No headaches, no peripheral tingling or numbness Derm:  No itching, no rash or sores.  Endocrine:  No sweats or chills.  + increased thirst and urination.  Sugars variable in low to mid 100s, up to 280s yesterday (outlier) Immunization:  Current on flu shot Travel:  None beyond local counties in last few months.    PHYSICAL EXAM: Vital signs in last 24 hours: Vitals:   03/02/18 0800 03/02/18 0930  BP: (!) 95/50 103/61  Pulse: 64 61  Resp: 17 16  Temp:    SpO2: 96% 96%   Wt Readings from Last 3 Encounters:  02/27/18 55.2 kg  02/19/18 54.3 kg  02/03/18 54.9 kg   Patient speaks only [place] so interview conducted with his daughter acting as Optometrist. General: somewhat frail appearing, comfortable Head: No facial asymmetry or swelling.  No signs of head trauma. Eyes: No scleral icterus.  Conjunctiva moderately pale.  EOMI. Ears: Not hard of hearing Nose: No congestion or discharge Mouth: Oral mucosa is moist, pink, clear.  Tongue midline.  Many missing teeth but remaining teeth without significant caries or signs of decay. Neck: No JVD, no masses, no thyromegaly. Lungs: Clear bilaterally.  No labored breathing. Heart: RRR.  No MRG.  S1, S2 present Abdomen: Soft.  Not distended.  Active bowel sounds.  Unable to appreciate splenomegaly or ascites.  No visible hernias.  Pain is worse when he laid on his right side and relieved once he returned to supine position.   Rectal: Deferred.  Stool was brown, FOBT positive per ED physician exam Musc/Skeltl: No joint redness, swelling or  deformities. Extremities: Slight pedal edema, nonpitting. Neurologic: Alert.  Oriented x3.  Moves all 4 limbs with full strength.  No tremor, no asterixis. Skin: No palmar erythema.  No telangiectasia.  No significant purpura or bruising. Tattoos: Moderately crude appearing tattoo "1991" on the left forearm. Nodes: No  cervical or inguinal adenopathy. Psych: Pleasant, cooperative, calm.  Speech fluid  Intake/Output from previous day: No intake/output data recorded. Intake/Output this shift: No intake/output data recorded.  LAB RESULTS: Recent Labs    03/02/18 0507  WBC 3.3*  HGB 10.8*  HCT 32.8*  PLT 25*   BMET Lab Results  Component Value Date   NA 136 03/02/2018   NA 127 (L) 12/23/2017   NA 127 (L) 12/19/2017   K 3.8 03/02/2018   K 5.6 (H) 12/23/2017   K 5.7 (H) 12/19/2017   CL 107 03/02/2018   CL 93 (L) 12/23/2017   CL 91 (L) 12/19/2017   CO2 24 03/02/2018   CO2 30 12/23/2017   CO2 30 12/19/2017   GLUCOSE 81 03/02/2018   GLUCOSE 397 (H) 12/23/2017   GLUCOSE 485 (H) 12/19/2017   BUN 13 03/02/2018   BUN 26 (H) 12/23/2017   BUN 30 (H) 12/19/2017   CREATININE 1.10 03/02/2018   CREATININE 1.10 12/23/2017   CREATININE 1.33 12/19/2017   CALCIUM 8.2 (L) 03/02/2018   CALCIUM 8.9 12/23/2017   CALCIUM 8.7 12/19/2017   LFT Recent Labs    03/02/18 0507  PROT 6.1*  ALBUMIN 2.5*  AST 45*  ALT 30  ALKPHOS 80  BILITOT 1.7*   PT/INR Lab Results  Component Value Date   INR 1.4 (H) 12/23/2017   INR 1.7 (H) 10/15/2017   INR 1.8 (H) 09/29/2017   Hepatitis Panel No results for input(s): HEPBSAG, HCVAB, HEPAIGM, HEPBIGM in the last 72 hours. C-Diff No components found for: CDIFF Lipase     Component Value Date/Time   LIPASE 53 (H) 03/02/2018 0507    Drugs of Abuse  No results found for: LABOPIA, COCAINSCRNUR, LABBENZ, AMPHETMU, THCU, LABBARB   RADIOLOGY STUDIES: Ct Abdomen Pelvis Wo Contrast  Result Date: 03/02/2018 CLINICAL DATA:  Epigastric abdominal pain and abdominal distention. Hepatic cirrhosis. EXAM: CT ABDOMEN AND PELVIS WITHOUT CONTRAST TECHNIQUE: Multidetector CT imaging of the abdomen and pelvis was performed following the standard protocol without IV contrast. COMPARISON:  02/20/2018 CT abdomen.  08/14/2017 CT abdomen/pelvis. FINDINGS: Lower chest: Stable  calcified granulomas and parenchymal banding at the left lung base, compatible with postinfectious scarring from prior granulomatous infection. Stable mild left pleural thickening. Hepatobiliary: Liver is atrophic with diffusely irregular liver surface and heterogeneous liver parenchyma, compatible with advanced cirrhosis. No discrete liver mass on this noncontrast CT. Mildly distended gallbladder. No gallbladder wall thickening. No radiopaque cholelithiasis. No biliary ductal dilatation. Pancreas: Normal, with no mass or duct dilation. Spleen: Moderate splenomegaly (craniocaudal splenic length 18.7 cm), stable. No splenic mass. Adrenals/Urinary Tract: Normal adrenals. No renal stones. No hydronephrosis. No contour deforming renal masses. Normal bladder. Stomach/Bowel: Normal non-distended stomach. Normal caliber small bowel with no small bowel wall thickening. Normal appendix. Oral contrast transits to the distal small bowel. Moderate colonic stool volume. No large bowel wall thickening, significant diverticulosis or acute pericolonic fat stranding. Vascular/Lymphatic: Normal caliber abdominal aorta. Stable moderate paraumbilical varices. No pathologically enlarged lymph nodes in the abdomen or pelvis. Reproductive: Top-normal size prostate with nonspecific internal prostatic calcifications. Other: No pneumoperitoneum.  Small volume perihepatic ascites, new. Musculoskeletal: No aggressive appearing focal osseous lesions. Mild lumbar spondylosis. IMPRESSION: 1. Advanced  cirrhosis.  No liver mass on this noncontrast CT. 2. New small volume perihepatic ascites. Moderate splenomegaly and moderate paraumbilical varices are stable. 3. No evidence of bowel obstruction or acute bowel inflammation. Electronically Signed   By: Ilona Sorrel M.D.   On: 03/02/2018 09:10    IMPRESSION:   *  Chronic pancytopenia.   Though Hgb down 4 gm from 2 months ago, it is within historic rr present 3 months ago. Known portal  hypertensive gastropathy is a likely source for FOBT + stool and contributing to anemia.  No indication of GI hemorrhage.    *   Epigastric pain.  CT reveals cirrhosis, small ascites, no liver masses, splenomegaly, paraumbilical varices.  Previous ultrasound showed GB sludge.  Pain worse when laying on right side, wonder if splenomegaly could be causing discomfort?    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  On tenofovir.   Child's class B.     *   Hx lymphocytic colitis.      PLAN:     *   Does not need PPI or Octreotide drip (asked ED MD to nix orders for these).    *  ? Utility of HIDA scan to r/o cholecystitis in pt with cirrhosis?   *  If no Endoscopic or additional radiologic studies planned for today, he can eat solid food.  Will clear plans with Dr Rush Landmark.     Azucena Freed  03/02/2018, 10:18 AM Phone [phone]

## 2018-03-02 NOTE — H&P (Signed)
Date: 03/02/2018               Patient Name:  Jose Snow MRN: [id number]  DOB: [birth year] Age / Sex: 47 y.o., male   PCP: Horald Pollen, MD         Medical Service: Internal Medicine Teaching Service         Attending Physician: Dr. Aldine Contes, MD    First Contact: Dr. Alfonse Spruce Pager: [phone]  Second Contact: Dr. Tarri Abernethy Pager: [phone]       After Hours (After 5p/  First Contact Pager: [phone]  weekends / holidays): Second Contact Pager: [phone]   Chief Complaint: Epigastric pain  History of Present Illness: Mr. 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 He describes the pain as burning. It does not radiate to the back. Food does not make it worse. He denies NSAID use, alcohol use or coffee intake.  He had an episode of epigastric pain on Saturday however this resolved.   Last bowel movement was on Saturday.  He also states he has seen his GI doctor for this similar epigastric pain.  He denies nausea/vomiting, diarrhea or bloody bowel movement. He states his last bowel movement was Saturday.  Meds:  Current Meds  Medication Sig  . insulin glargine (LANTUS) 100 UNIT/ML injection Inject 9 Units into the skin daily.  Marland Kitchen levocetirizine (XYZAL) 5 MG tablet TAKE 1 TABLET BY MOUTH EVERY EVENING (Patient taking differently: Take 5 mg by mouth every evening. )  . nadolol (CORGARD) 20 MG tablet Take 0.5 tablets (10 mg total) by mouth daily. (Patient taking differently: Take 20 mg by mouth daily. )  . omeprazole (PRILOSEC) 40 MG capsule Take 1 capsule (40 mg total) by mouth 2 (two) times daily.  . sitaGLIPtin (JANUVIA) 25 MG tablet Take 1 tablet (25 mg total) by mouth daily.  Marland Kitchen spironolactone (ALDACTONE) 100 MG tablet Take 1 tablet (100 mg total) by mouth  daily.  . sucralfate (CARAFATE) 1 GM/10ML suspension Take 10 mLs (1 g total) by mouth 4 (four) times daily. Take between meals and at bedtime.  Marland Kitchen tenofovir (VIREAD) 300 MG tablet Take 300 mg by mouth daily.     Allergies: Allergies as of 03/02/2018  . (No Known Allergies)   Past Medical History:  Diagnosis Date  . Cirrhosis ([place])   . Diabetes mellitus ([place])   . Hepatitis B   . Lymphocytic colitis   . 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     Family History:  Family History  Problem Relation Age of Onset  . Colon cancer Neg Hx   . Esophageal cancer Neg Hx   . Pancreatic cancer Neg Hx   . Stomach cancer Neg Hx   . Liver disease Neg Hx    Social History: tobacco use: Denies Alcohol use: Denies Illicit drug use: Denies  Review of Systems: A complete ROS was negative except as per HPI.   Physical Exam: Blood pressure (!) 105/58, pulse (!) 59, temperature 97.9 F (36.6 C), temperature source Oral, resp. rate 20, SpO2 93 %. Physical Exam  Constitutional: He is oriented to person, place, and time. No distress.  Chronically ill-appearing male  HENT:  Head: Normocephalic and atraumatic.  Eyes: Pupils are equal, round, and reactive  to light.  Neck: Normal range of motion.  Cardiovascular: Normal rate, regular rhythm and normal heart sounds. Exam reveals no gallop and no friction rub.  No murmur heard. Pulmonary/Chest: Effort normal and breath sounds normal. No respiratory distress. He has no wheezes. He has no rales.  Abdominal: Soft. Bowel sounds are normal. He exhibits no distension and no mass. There is no rebound and no guarding.  Tenderness in epigastric region  Musculoskeletal: He exhibits no edema.  Neurological: He is alert and oriented to person, place, and time.  Skin: Skin is warm and dry. He is not diaphoretic.     EKG: personally reviewed my interpretation is normal sinus rhythm   Mr. Hanner is a 47 year old male with history of cirrhosis  from chronic hep B and type 2 diabetes that presents to the ED for epigastric pain that has been ongoing for the past month, worsened this morning.  CT abdomen pelvis showed advanced cirrhosis.  Assessment & Plan by Problem: Active Problems:   Epigastric pain  Epigastric pain Patient was seen by GI on 10/29 for epigastric pain. At that time omeprazole 40 MG BID was prescribed as pain was thought to be due to gastritis/portal gastropathy.  Will continue home omeprazole and Carafate while inpatient. GI was consulted for further assessment.   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  Patient's hemoglobin was 10 on admission down from 14, 2 months ago. There was concern for GI bleed with history of esophageal varices, FOBT positive and epigastric pain. Octreotide and Protonix was started in the ED.  Per GI this was stopped as hemoglobin appears to be near baseline based on previous readings.  If any signs of bleeding or becomes hemodynamically unstable would restart PPI and octreotide.  Platelets 25 and stable.   -CBC  Dizziness Reports becomes dizzy when getting up from a seated position to start walking.  He is not a lawless decreased from 20-10 mg last month for the symptoms. -Orthostatics - PT eval  Cirrhosis from chronic hepatitis B -Continue tenofovir -Continue nadolol - Continue Spironalactone  Type 2 diabetes Home medications include Lantus and Januvia -Lantus 9 units QHS -SSI   Seasonal allergies -Continue home levocetirizine   Dispo: Admit patient to Inpatient with expected length of stay greater than 2 midnights.  Signed: Valinda Party, DO 03/02/2018, 1:12 PM  Pager: [phone]

## 2018-03-02 NOTE — Consult Note (Addendum)
[facility] Consult: 10:18 AM 03/02/2018  LOS: 0 days    Referring Provider: Dr Lita Mains in ED  Primary Care Physician:  Horald Pollen, MD Primary Gastroenterologist:  [name]  Hematologist: Dr Irene Limbo Daughter, translator Brai (prononunced bry) H.  [phone]   Reason for Consultation:  Epigastric pain and anemia in cirrhotic.     HPI: Jose Snow is a 47 y.o. male.  PMH Hep B, on Tenofovir since 08/2017, followed by ID clinic.  Cirrhosis. Lymphocytic colitis.  TB ( treated).  IDDM.  Pancytopenia.  S/p bone marrow bx 08/2017.  Dr Irene Limbo feels cytopenia likely due to cirrhosis/hypersplenism/chronic Hep B infection.    10/2017 EGD for variceal screening.  Mild portal gastropathy, small to medium distal esophageal varices.  Started on Nadolol 20 mg daily post procedure.   Takes PPI and Carafate.   10/2017 Colonoscopy for chronic diarrhea.  Internal hemorrhoids.  Random bxs of grossly normal mucosa revealed lymphocytic colitis.  Budesonide initiated but ultimately discontinued (? Due to hyperglycemia).  Has been taking Lactulose for swing to constipation.   10/2017 ultrasound abdomen: Cirrhosis.  Splenomegaly.  GB sludge, no cholelithiasis.  Non-specific GB wall thickening.    At last visit with liver transplant MD, has "a longer work-up" before they are able to do anything. Labs improving due to antiviral temofovir. At least 3 weeks of dizziness. Dose of Nadolol decreased to 10 mg daily on 10/29 after GI OV. Also underwent CT for c/o epigastric pain (see below). He takes Carafate, Omeprazole.     Lasix discontinued by PMD 11/14 due to hypotension.     02/20/18 CT abdomen with contrast: cirrhosis.  Splenomegaly with enlargement of the portal vein and venous collaterals, consistent with portal venous  hypertension.  No ascites.  Pulmonary granulomas.    Epigastric pain intermittent, not triggered by po.  Pain is worse if he lays on his right side and relieve if he lays on his back or left side.  No N/V or anorexia, no diarrhea or bloody stool, no radiation to chest or back.  An episode of pain lasting a few hours on Saturday, resolved.  Recurrent more severe pain 3 AM today.   Hgb 10.8, baselilne in 11/2017 was 10 to 11 and was up to 14.9 on 12/23/17.  MCV 100 (as high as 103 in 12/2017.   Contd leukopenia (currently slightly improved), thrombocytopenia (remains in historic range).   LFTs with  T bili 1.7 (up to 4 on 12/23/17)   Alk phos 80 (was 105)   AST/ALT 45/30 (was 37/58) Lipase is 53 Renal fx not deranged.   INR 1.5 FOBT + brown stool on rectal exam.   03/02/18 CT abd pelvis: Advanced cirrhosis.  No liver mass on noncontrast CT.  New small volume perihepatic ascites.   Stable moderate splenomegaly and moderate paraumbilical varices.  No bowel obstruction or acute bowel inflammation.    Past Medical History:  Diagnosis Date  . Cirrhosis ([place])   . Diabetes mellitus ([place])   . Hepatitis B   . Lymphocytic  colitis   . 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     Past Surgical History:  Procedure Laterality Date  . BIOPSY  10/23/2017   Procedure: BIOPSY;  Surgeon: Milus Banister, MD;  Location: [facility];  Service: Endoscopy;;  . COLONOSCOPY WITH PROPOFOL N/A 10/23/2017   Procedure: COLONOSCOPY WITH PROPOFOL;  Surgeon: Milus Banister, MD;  Location: [facility];  Service: Endoscopy;  Laterality: N/A;  . COMPLEX WOUND CLOSURE Right 12/15/2015   Procedure: COMPLEX WOUND CLOSURE;  Surgeon: Leanora Cover, MD;  Location: [facility];  Service: Orthopedics;  Laterality: Right;  . ESOPHAGOGASTRODUODENOSCOPY (EGD) WITH PROPOFOL N/A 10/23/2017   Procedure: ESOPHAGOGASTRODUODENOSCOPY (EGD) WITH PROPOFOL;  Surgeon: Milus Banister, MD;  Location: [facility];  Service: Endoscopy;   Laterality: N/A;  . I&D EXTREMITY Right 12/15/2015   Procedure: IRRIGATION AND DEBRIDEMENT AND REVISION AMPUTATION RIGHT RING FINGER;  Surgeon: Leanora Cover, MD;  Location: [facility];  Service: Orthopedics;  Laterality: Right;    Prior to Admission medications   Medication Sig Start Date End Date Taking? Authorizing Provider  insulin glargine (LANTUS) 100 UNIT/ML injection Inject 9 Units into the skin daily.   Yes [provider]  levocetirizine (XYZAL) 5 MG tablet TAKE 1 TABLET BY MOUTH EVERY EVENING Patient taking differently: Take 5 mg by mouth every evening.  02/09/18  Yes Rutherford Guys, MD  nadolol (CORGARD) 20 MG tablet Take 0.5 tablets (10 mg total) by mouth daily. Patient taking differently: Take 20 mg by mouth daily.  02/04/18  Yes Milus Banister, MD  omeprazole (PRILOSEC) 40 MG capsule Take 1 capsule (40 mg total) by mouth 2 (two) times daily. 02/03/18  Yes Levin Erp, PA  sitaGLIPtin (JANUVIA) 25 MG tablet Take 1 tablet (25 mg total) by mouth daily. 12/23/17  Yes Sagardia, Ines Bloomer, MD  spironolactone (ALDACTONE) 100 MG tablet Take 1 tablet (100 mg total) by mouth daily. 11/06/17 03/02/18 Yes Milus Banister, MD  sucralfate (CARAFATE) 1 GM/10ML suspension Take 10 mLs (1 g total) by mouth 4 (four) times daily. Take between meals and at bedtime. 12/23/17  Yes Esterwood, Amy S, PA-C  tenofovir (VIREAD) 300 MG tablet Take 300 mg by mouth daily.   Yes [provider]  furosemide (LASIX) 40 MG tablet Take 1 tablet (40 mg total) by mouth daily. Patient not taking: Reported on 03/02/2018 11/06/17 02/04/18  Milus Banister, MD  lactulose (CHRONULAC) 10 GM/15ML solution Take 45 mLs (30 g total) by mouth daily. Patient not taking: Reported on 02/19/2018 12/23/17   Esterwood, Amy S, PA-C    Scheduled Meds:  Infusions: . pantoprazole (PROTONIX) IVPB     PRN Meds:    Allergies as of 03/02/2018  . (No Known Allergies)    Family History  Problem Relation Age  of Onset  . Colon cancer Neg Hx   . Esophageal cancer Neg Hx   . Pancreatic cancer Neg Hx   . Stomach cancer Neg Hx   . Liver disease Neg Hx     Social History   Socioeconomic History  . Marital status: Married    Spouse name: Not on file  . Number of children: 4  . Years of education: Not on file  . Highest education level: Not on file  Occupational History  . 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 Financial resource strain: Not on file  . Food insecurity:    Worry: Not  on file    Inability: Not on file  . Transportation needs:    Medical: Not on file    Non-medical: Not on file  Tobacco Use  . Smoking status: Former Smoker    Packs/day: 0.50    Years: 8.00    Pack years: 4.00    Types: Cigarettes    Last attempt to quit: 04/08/1990    Years since quitting: 27.9  . Smokeless tobacco: Never Used  Substance and Sexual Activity  . Alcohol use: No  . Drug use: No  . Sexual activity: Yes  Lifestyle  . Physical activity:    Days per week: Not on file    Minutes per session: Not on file  . Stress: Not on file  Relationships  . Social connections:    Talks on phone: Not on file    Gets together: Not on file    Attends religious service: Not on file    Active member of club or organization: Not on file    Attends meetings of clubs or organizations: Not on file    Relationship status: Not on file  . Intimate partner violence:    Fear of current or ex partner: Not on file    Emotionally abused: Not on file    Physically abused: Not on file    Forced sexual activity: Not on file  Other Topics Concern  . 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  Lives with wife and four children.     REVIEW OF SYSTEMS: Constitutional:  Weakness for a few days ENT:  No nose bleeds Pulm:  No cough or dyspnea.   CV:  No palpitations, no LE edema.  GU:  No hematuria, no frequency GI:  Per HPI Heme:  No unusual bleeding.  Bruises  at site of insulin injection on belly Transfusions:  Platelets in 11/2017 Neuro:  No headaches, no peripheral tingling or numbness Derm:  No itching, no rash or sores.  Endocrine:  No sweats or chills.  + increased thirst and urination.  Sugars variable in low to mid 100s, up to 280s yesterday (outlier) Immunization:  Current on flu shot Travel:  None beyond local counties in last few months.    PHYSICAL EXAM: Vital signs in last 24 hours: Vitals:   03/02/18 0800 03/02/18 0930  BP: (!) 95/50 103/61  Pulse: 64 61  Resp: 17 16  Temp:    SpO2: 96% 96%   Wt Readings from Last 3 Encounters:  02/27/18 55.2 kg  02/19/18 54.3 kg  02/03/18 54.9 kg   Patient speaks only [place] so interview conducted with his daughter acting as Optometrist. General: somewhat frail appearing, comfortable Head: No facial asymmetry or swelling.  No signs of head trauma. Eyes: No scleral icterus.  Conjunctiva moderately pale.  EOMI. Ears: Not hard of hearing Nose: No congestion or discharge Mouth: Oral mucosa is moist, pink, clear.  Tongue midline.  Many missing teeth but remaining teeth without significant caries or signs of decay. Neck: No JVD, no masses, no thyromegaly. Lungs: Clear bilaterally.  No labored breathing. Heart: RRR.  No MRG.  S1, S2 present Abdomen: Soft.  Not distended.  Active bowel sounds.  Unable to appreciate splenomegaly or ascites.  No visible hernias.  Pain is worse when he laid on his right side and relieved once he returned to supine position.   Rectal: Deferred.  Stool was brown, FOBT positive per ED physician exam Musc/Skeltl: No joint redness, swelling or  deformities. Extremities: Slight pedal edema, nonpitting. Neurologic: Alert.  Oriented x3.  Moves all 4 limbs with full strength.  No tremor, no asterixis. Skin: No palmar erythema.  No telangiectasia.  No significant purpura or bruising. Tattoos: Moderately crude appearing tattoo "1991" on the left forearm. Nodes: No  cervical or inguinal adenopathy. Psych: Pleasant, cooperative, calm.  Speech fluid  Intake/Output from previous day: No intake/output data recorded. Intake/Output this shift: No intake/output data recorded.  LAB RESULTS: Recent Labs    03/02/18 0507  WBC 3.3*  HGB 10.8*  HCT 32.8*  PLT 25*   BMET Lab Results  Component Value Date   NA 136 03/02/2018   NA 127 (L) 12/23/2017   NA 127 (L) 12/19/2017   K 3.8 03/02/2018   K 5.6 (H) 12/23/2017   K 5.7 (H) 12/19/2017   CL 107 03/02/2018   CL 93 (L) 12/23/2017   CL 91 (L) 12/19/2017   CO2 24 03/02/2018   CO2 30 12/23/2017   CO2 30 12/19/2017   GLUCOSE 81 03/02/2018   GLUCOSE 397 (H) 12/23/2017   GLUCOSE 485 (H) 12/19/2017   BUN 13 03/02/2018   BUN 26 (H) 12/23/2017   BUN 30 (H) 12/19/2017   CREATININE 1.10 03/02/2018   CREATININE 1.10 12/23/2017   CREATININE 1.33 12/19/2017   CALCIUM 8.2 (L) 03/02/2018   CALCIUM 8.9 12/23/2017   CALCIUM 8.7 12/19/2017   LFT Recent Labs    03/02/18 0507  PROT 6.1*  ALBUMIN 2.5*  AST 45*  ALT 30  ALKPHOS 80  BILITOT 1.7*   PT/INR Lab Results  Component Value Date   INR 1.4 (H) 12/23/2017   INR 1.7 (H) 10/15/2017   INR 1.8 (H) 09/29/2017   Hepatitis Panel No results for input(s): HEPBSAG, HCVAB, HEPAIGM, HEPBIGM in the last 72 hours. C-Diff No components found for: CDIFF Lipase     Component Value Date/Time   LIPASE 53 (H) 03/02/2018 0507    Drugs of Abuse  No results found for: LABOPIA, COCAINSCRNUR, LABBENZ, AMPHETMU, THCU, LABBARB   RADIOLOGY STUDIES: Ct Abdomen Pelvis Wo Contrast  Result Date: 03/02/2018 CLINICAL DATA:  Epigastric abdominal pain and abdominal distention. Hepatic cirrhosis. EXAM: CT ABDOMEN AND PELVIS WITHOUT CONTRAST TECHNIQUE: Multidetector CT imaging of the abdomen and pelvis was performed following the standard protocol without IV contrast. COMPARISON:  02/20/2018 CT abdomen.  08/14/2017 CT abdomen/pelvis. FINDINGS: Lower chest: Stable  calcified granulomas and parenchymal banding at the left lung base, compatible with postinfectious scarring from prior granulomatous infection. Stable mild left pleural thickening. Hepatobiliary: Liver is atrophic with diffusely irregular liver surface and heterogeneous liver parenchyma, compatible with advanced cirrhosis. No discrete liver mass on this noncontrast CT. Mildly distended gallbladder. No gallbladder wall thickening. No radiopaque cholelithiasis. No biliary ductal dilatation. Pancreas: Normal, with no mass or duct dilation. Spleen: Moderate splenomegaly (craniocaudal splenic length 18.7 cm), stable. No splenic mass. Adrenals/Urinary Tract: Normal adrenals. No renal stones. No hydronephrosis. No contour deforming renal masses. Normal bladder. Stomach/Bowel: Normal non-distended stomach. Normal caliber small bowel with no small bowel wall thickening. Normal appendix. Oral contrast transits to the distal small bowel. Moderate colonic stool volume. No large bowel wall thickening, significant diverticulosis or acute pericolonic fat stranding. Vascular/Lymphatic: Normal caliber abdominal aorta. Stable moderate paraumbilical varices. No pathologically enlarged lymph nodes in the abdomen or pelvis. Reproductive: Top-normal size prostate with nonspecific internal prostatic calcifications. Other: No pneumoperitoneum.  Small volume perihepatic ascites, new. Musculoskeletal: No aggressive appearing focal osseous lesions. Mild lumbar spondylosis. IMPRESSION: 1. Advanced  cirrhosis.  No liver mass on this noncontrast CT. 2. New small volume perihepatic ascites. Moderate splenomegaly and moderate paraumbilical varices are stable. 3. No evidence of bowel obstruction or acute bowel inflammation. Electronically Signed   By: Ilona Sorrel M.D.   On: 03/02/2018 09:10    IMPRESSION:   *  Chronic pancytopenia.   Though Hgb down 4 gm from 2 months ago, it is within historic rr present 3 months ago. Known portal  hypertensive gastropathy is a likely source for FOBT + stool and contributing to anemia.  No indication of GI hemorrhage.    *   Epigastric pain.  CT reveals cirrhosis, small ascites, no liver masses, splenomegaly, paraumbilical varices.  Previous ultrasound showed GB sludge.  Pain worse when laying on right side, wonder if splenomegaly could be causing discomfort?    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  On tenofovir.   Child's class B.     *   Hx lymphocytic colitis.      PLAN:     *   Does not need PPI or Octreotide drip (asked ED MD to nix orders for these).    *  ? Utility of HIDA scan to r/o cholecystitis in pt with cirrhosis?   *  If no Endoscopic or additional radiologic studies planned for today, he can eat solid food.  Will clear plans with Dr Rush Landmark.     Azucena Freed  03/02/2018, 10:18 AM Phone [phone]

## 2018-03-03 ENCOUNTER — Encounter (HOSPITAL_COMMUNITY)
Admission: EM | Disposition: A | Discharge status: Home / Self Care | Payer: Self-pay | Source: Home / Self Care | Attending: Internal Medicine

## 2018-03-03 ENCOUNTER — Inpatient Hospital Stay (HOSPITAL_COMMUNITY): Payer: PRIVATE HEALTH INSURANCE | Admitting: Certified Registered"

## 2018-03-03 ENCOUNTER — Encounter (HOSPITAL_COMMUNITY): Payer: Self-pay | Admitting: *Deleted

## 2018-03-03 DIAGNOSIS — I8510 Secondary esophageal varices without bleeding: Secondary | ICD-10-CM

## 2018-03-03 DIAGNOSIS — Z8611 Personal history of tuberculosis: Secondary | ICD-10-CM

## 2018-03-03 DIAGNOSIS — K766 Portal hypertension: Secondary | ICD-10-CM

## 2018-03-03 DIAGNOSIS — R188 Other ascites: Secondary | ICD-10-CM

## 2018-03-03 DIAGNOSIS — E440 Moderate protein-calorie malnutrition: Secondary | ICD-10-CM

## 2018-03-03 DIAGNOSIS — K3189 Other diseases of stomach and duodenum: Secondary | ICD-10-CM

## 2018-03-03 HISTORY — PX: ESOPHAGOGASTRODUODENOSCOPY (EGD) WITH PROPOFOL: SHX5813

## 2018-03-03 HISTORY — PX: BIOPSY: SHX5522

## 2018-03-03 LAB — GLUCOSE, CAPILLARY
Glucose-Capillary: 106 mg/dL — ABNORMAL HIGH (ref 70–99)
Glucose-Capillary: 115 mg/dL — ABNORMAL HIGH (ref 70–99)
Glucose-Capillary: 134 mg/dL — ABNORMAL HIGH (ref 70–99)
Glucose-Capillary: 156 mg/dL — ABNORMAL HIGH (ref 70–99)
Glucose-Capillary: 209 mg/dL — ABNORMAL HIGH (ref 70–99)

## 2018-03-03 LAB — CBC
HEMATOCRIT: 30.2 % — AB (ref 39.0–52.0)
Hemoglobin: 10.1 g/dL — ABNORMAL LOW (ref 13.0–17.0)
MCH: 33 pg (ref 26.0–34.0)
MCHC: 33.4 g/dL (ref 30.0–36.0)
MCV: 98.7 fL (ref 80.0–100.0)
NRBC: 0 % (ref 0.0–0.2)
Platelets: 19 10*3/uL — CL (ref 150–400)
RBC: 3.06 MIL/uL — ABNORMAL LOW (ref 4.22–5.81)
RDW: 14.4 % (ref 11.5–15.5)
WBC: 2.5 10*3/uL — ABNORMAL LOW (ref 4.0–10.5)

## 2018-03-03 LAB — COMPREHENSIVE METABOLIC PANEL
ALBUMIN: 2.3 g/dL — AB (ref 3.5–5.0)
ALT: 27 U/L (ref 0–44)
ANION GAP: 5 (ref 5–15)
AST: 39 U/L (ref 15–41)
Alkaline Phosphatase: 65 U/L (ref 38–126)
BUN: 12 mg/dL (ref 6–20)
CHLORIDE: 105 mmol/L (ref 98–111)
CO2: 26 mmol/L (ref 22–32)
Calcium: 7.9 mg/dL — ABNORMAL LOW (ref 8.9–10.3)
Creatinine, Ser: 1.45 mg/dL — ABNORMAL HIGH (ref 0.61–1.24)
GFR calc Af Amer: >60 mL/min (ref >60)
GFR calc non Af Amer: 55 mL/min — ABNORMAL LOW (ref >60)
GLUCOSE: 166 mg/dL — AB (ref 70–99)
POTASSIUM: 4.3 mmol/L (ref 3.5–5.1)
Sodium: 136 mmol/L (ref 135–145)
TOTAL PROTEIN: 5.4 g/dL — AB (ref 6.5–8.1)
Total Bilirubin: 2.1 mg/dL — ABNORMAL HIGH (ref 0.3–1.2)

## 2018-03-03 LAB — HIV ANTIBODY (ROUTINE TESTING W REFLEX): HIV SCREEN 4TH GENERATION: NONREACTIVE

## 2018-03-03 SURGERY — ESOPHAGOGASTRODUODENOSCOPY (EGD) WITH PROPOFOL
Anesthesia: Monitor Anesthesia Care

## 2018-03-03 MED ORDER — LIDOCAINE 2% (20 MG/ML) 5 ML SYRINGE
INTRAMUSCULAR | Status: DC | PRN
  Administered 2018-03-03: 40 mg via INTRAVENOUS

## 2018-03-03 MED ORDER — ALUM & MAG HYDROXIDE-SIMETH 200-200-20 MG/5ML PO SUSP
30.0000 mL | Freq: Once | ORAL | Status: AC
  Administered 2018-03-03: 30 mL via ORAL
  Filled 2018-03-03: qty 30

## 2018-03-03 MED ORDER — GLUCERNA SHAKE PO LIQD: 237.0000 mL | Freq: Two times a day (BID) | ORAL | Status: DC

## 2018-03-03 MED ORDER — PROPOFOL 10 MG/ML IV BOLUS
INTRAVENOUS | Status: DC | PRN
  Administered 2018-03-03: 80 mg via INTRAVENOUS
  Administered 2018-03-03: 40 mg via INTRAVENOUS
  Administered 2018-03-03: 20 mg via INTRAVENOUS
  Administered 2018-03-03: 30 mg via INTRAVENOUS

## 2018-03-03 MED ORDER — ADULT MULTIVITAMIN W/MINERALS CH
1.0000 | ORAL_TABLET | Freq: Every day | ORAL | Status: DC
  Administered 2018-03-03 – 2018-03-04 (×2): 1 via ORAL
  Filled 2018-03-03: qty 1

## 2018-03-03 MED ORDER — PHENYLEPHRINE 40 MCG/ML (10ML) SYRINGE FOR IV PUSH (FOR BLOOD PRESSURE SUPPORT)
PREFILLED_SYRINGE | INTRAVENOUS | Status: DC | PRN
  Administered 2018-03-03: 80 ug via INTRAVENOUS
  Administered 2018-03-03: 120 ug via INTRAVENOUS
  Administered 2018-03-03: 80 ug via INTRAVENOUS

## 2018-03-03 MED ORDER — PROPOFOL 500 MG/50ML IV EMUL
INTRAVENOUS | Status: AC
  Filled 2018-03-03: qty 50

## 2018-03-03 MED ORDER — SODIUM CHLORIDE 0.9 % IV SOLN
INTRAVENOUS | Status: DC
  Administered 2018-03-03: 09:00:00 via INTRAVENOUS

## 2018-03-03 SURGICAL SUPPLY — 15 items

## 2018-03-03 NOTE — Anesthesia Preprocedure Evaluation (Addendum)
Anesthesia Evaluation  Patient identified by MRN, date of birth, ID band Patient awake    Reviewed: Allergy & Precautions, NPO status , Patient's Chart, lab work & pertinent test results  Airway Mallampati: II  TM Distance: >3 FB Neck ROM: Full    Dental  (+) Missing, Poor Dentition,    Pulmonary neg pulmonary ROS, former smoker,  Tuberculosis s/p treatment   breath sounds clear to auscultation       Cardiovascular negative cardio ROS   Rhythm:Regular Rate:Normal  TTE 09/2017 - Left ventricle: The cavity size was normal. Wall thickness was normal. Systolic function was vigorous. The estimated ejection fraction was in the range of 65% to 70%. Wall motion was normal; there were no regional wall motion abnormalities. Doppler parameters are consistent with abnormal left ventricular relaxation (grade 1 diastolic dysfunction). - Atrial septum: Positive bubble study. Very small volume late bubble cross over, possible small PFO vs. intrapulmonary shunt. - Tricuspid valve: There was mild regurgitation. - Pulmonary arteries: Systolic pressure was mildly increased. PA peak pressure: 43 mm Hg (S).   Neuro/Psych negative neurological ROS  negative psych ROS   GI/Hepatic negative GI ROS, (+) Cirrhosis       , Hepatitis -, B  Endo/Other  negative endocrine ROSdiabetes, Type 2, Insulin Dependent  Renal/GU negative Renal ROS  negative genitourinary   Musculoskeletal negative musculoskeletal ROS (+)   Abdominal   Peds  Hematology  (+) Blood dyscrasia, anemia ,   Anesthesia Other Findings Anemia, FOBT + stool  Reproductive/Obstetrics                           Anesthesia Physical Anesthesia Plan  ASA: III  Anesthesia Plan: MAC   Post-op Pain Management:    Induction: Intravenous  PONV Risk Score and Plan: 1 and Treatment may vary due to age or medical condition and Propofol infusion  Airway Management  Planned: Natural Airway  Additional Equipment:   Intra-op Plan:   Post-operative Plan:   Informed Consent: I have reviewed the patients History and Physical, chart, labs and discussed the procedure including the risks, benefits and alternatives for the proposed anesthesia with the patient or authorized representative who has indicated his/her understanding and acceptance.   Dental advisory given  Plan Discussed with: CRNA  Anesthesia Plan Comments:         Anesthesia Quick Evaluation

## 2018-03-03 NOTE — Interval H&P Note (Signed)
History and Physical Interval Note:  03/03/2018 9:45 AM  [name]  has presented today for surgery, with the diagnosis of , anemia, brown FOBT + stool  The various methods of treatment have been discussed with the patient and family. After consideration of risks, benefits and other options for treatment, the patient has consented to  Procedure(s): ESOPHAGOGASTRODUODENOSCOPY (EGD) WITH PROPOFOL (N/A) as a surgical intervention .  The patient's history has been reviewed, patient examined, no change in status, stable for surgery.  I have reviewed the patient's chart and labs.  Questions were answered to the patient's satisfaction.    The patient is feeling better today than yesterday.  We do not have interpreter services other than the patient's daughter.  I had used a cone translator yesterday evening when we discussed the role of endoscopy there have been no significant changes.  Patient's daughter discussed with patient that we went over the risks once again today for endoscopy.  The risks and benefits of endoscopic evaluation were discussed with the patient; these include but are not limited to the risk of perforation, infection, bleeding, missed lesions, lack of diagnosis, severe illness requiring hospitalization, as well as anesthesia and sedation related illnesses.  The patient is agreeable to proceed.     [employer]

## 2018-03-03 NOTE — Progress Notes (Signed)
   03/03/18 0925  Clinical Encounter Type  Visited With Patient not available  Visit Type Initial  Referral From Nurse  Consult/Referral To Chaplain;Other (Comment) (AD)  The chaplain responded to spiritual care consult for Pt. AD.  The Pt. was out of the room at chaplain's visit.  The chaplain will F/U with Pt. request for AD when he is present.

## 2018-03-03 NOTE — Progress Notes (Cosign Needed)
HIDA shows patency of cystic duct though sluggish flow, impaired hepatocellular dysfx.  Overall findings not reflective of acute cholecystitis but not able to r/o chronic cholecystitis.   LFTs with mildly elevated T bili but in pt with cirrhosis, Hep B they are non-specific.   Remains pancytopenic.   EGD on board for 0930 today for eval of epigastric pain.      Azucena Freed PA-C [phone].

## 2018-03-03 NOTE — Care Management Note (Signed)
Case Management Note  Patient Details  Name: Jose Snow MRN: [id number] Date of Birth: 1970-08-22  Subjective/Objective:  From home with spouse, presents with gastritis, GI following, if hgb remains stable and abd pain improves he may be for possible dc tomorrow. No needs at this time.                Action/Plan: NCM will follow for transition of care needs.   Expected Discharge Date:  03/04/18               Expected Discharge Plan:  Home/Self Care  In-House Referral:     Discharge planning Services  CM Consult  Post Acute Care Choice:    Choice offered to:     DME Arranged:    DME Agency:     HH Arranged:    HH Agency:     Status of Service:  In process, will continue to follow  If discussed at Long Length of Stay Meetings, dates discussed:    Additional Comments:  Zenon Mayo, RN 03/03/2018, 3:03 PM

## 2018-03-03 NOTE — Progress Notes (Signed)
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  GOAL:   Patient will meet greater than or equal to 90% of their needs  MONITOR:   PO intake, Supplement acceptance, Labs, Weight trends  REASON FOR ASSESSMENT:   Malnutrition Screening Tool    ASSESSMENT:   47 year old male who presented to the ED on 11/25 with epigastric pain. 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 FOBT positive.  HIDA showed patency of cystic duct though sluggish flow with impaired hepatocellular dysfunction.  EGD this morning showed non-bleeding erosive gastropathy, portal hypertensive gastropathy, and small esophageal varices.  Spoke with pt and family at bedside. Pt reports that he has a good appetite and eats 3 meals daily. Pt also endorses snacking between meals. Pt expresses feeling hungry at time of RD visit.  Pt states that he has changed his eating habits since learning of his diabetes diagnosis. For example, pt used to eat McDonald's for breakfast whereas now he will eat a sandwich. Lunch includes rice, meat, and vegetables. Dinner is the same as lunch. Pt snacks on mangos and apples and drinks water and black coffee.  Pt endorses weight loss but is unclear regarding the timeframe of this weight loss. Pt reports his UBW as 130 lbs. Family states that pt's most recent weight at home was 118 lbs. Noted current weight on admission is 123.9 lbs.  Per weight history in chart, pt's weight has fluctuated between  49-61.3 kg over the past 7 months. Suspect weight fluctuates related in part to fluid status.  Pt shares that he drinks Boost High Protein shakes at home and is willing to try Glucerna shakes during admission. RD to order BID.  Pt denies any N/V at this time. Pt also denies any issues chewing or swallowing.  Medications reviewed and include: SSI, Lantus, Protonix, Aldactone  Labs reviewed: platelets 19 (L) CG's: 134, 209, 204, 85 x 24 hours  NUTRITION - FOCUSED PHYSICAL EXAM:    Most Recent Value  Orbital Region  Mild depletion  Upper Arm Region  Mild depletion  Thoracic and Lumbar Region  Mild depletion  Buccal Region  No depletion  Temple Region  No depletion  Clavicle Bone Region  Mild depletion  Clavicle and Acromion Bone Region  Moderate depletion  Scapular Bone Region  Unable to assess  Dorsal Hand  No depletion  Patellar Region  No depletion  Anterior Thigh Region  Mild depletion  Posterior Calf Region  No depletion  Edema (RD Assessment)  None  Hair  Reviewed  Eyes  Reviewed  Mouth  Reviewed [several missing teeth]  Skin  Reviewed  Nails  Reviewed       Diet Order:   Diet Order            Diet Heart Room service appropriate? Yes; Fluid consistency: Thin  Diet effective now              EDUCATION NEEDS:   Education needs have been addressed  Skin:  Skin Assessment: Reviewed RN Assessment  Last BM:  unknown/PTA  Height:   Ht Readings  from Last 1 Encounters:  03/02/18 5' 2"  (1.575 m)    Weight:   Wt Readings from Last 1 Encounters:  03/02/18 56.2 kg    Ideal Body Weight:  53.64 kg  BMI:  Body mass index is 22.66 kg/m.  Estimated Nutritional Needs:   Kcal:  1700-1900  Protein:  80-90 grams  Fluid:  1.7-1.9 L    Gaynell Face, MS, RD, LDN Inpatient Clinical Dietitian Pager: [phone] Weekend/After Hours: [phone]

## 2018-03-03 NOTE — Anesthesia Procedure Notes (Signed)
Procedure Name: MAC Date/Time: 03/03/2018 10:12 AM Performed by: Orlie Dakin, CRNA Pre-anesthesia Checklist: Patient identified, Emergency Drugs available, Suction available and Patient being monitored Patient Re-evaluated:Patient Re-evaluated prior to induction Oxygen Delivery Method: Nasal cannula Preoxygenation: Pre-oxygenation with 100% oxygen

## 2018-03-03 NOTE — Op Note (Signed)
[facility] Patient Name: Jose Snow Procedure Date : 03/03/2018 MRN: [id number] Attending MD: Justice Britain , MD Date of Birth: 1970-06-09 CSN: [id number] Age: 47 Admit Type: Inpatient Procedure:                Upper GI endoscopy Indications:              Epigastric abdominal pain, Decreased blood counts                            compared to last blood count however close to                            normal baseline Providers:                Justice Britain, MD, Cleda Daub, RN, Cherylynn Ridges, Technician Referring MD:             Milus Banister, MD Medicines:                Monitored Anesthesia Care Complications:            No immediate complications. Estimated Blood Loss:     Estimated blood loss was minimal. Procedure:                Pre-Anesthesia Assessment:                           - Prior to the procedure, a History and Physical                            was performed, and patient medications and                            allergies were reviewed. The patient's tolerance of                            previous anesthesia was also reviewed. The risks                            and benefits of the procedure and the sedation                            options and risks were discussed with the patient.                            All questions were answered, and informed consent                            was obtained. Prior Anticoagulants: The patient has                            taken no previous anticoagulant or antiplatelet  agents. ASA Grade Assessment: III - A patient with                            severe systemic disease. After reviewing the risks                            and benefits, the patient was deemed in                            satisfactory condition to undergo the procedure.                           After obtaining informed consent, the endoscope was   passed under direct vision. Throughout the                            procedure, the patient's blood pressure, pulse, and                            oxygen saturations were monitored continuously. The                            GIF-H190 ([id number]) Olympus Adult EGD was introduced                            through the mouth, and advanced to the second part                            of duodenum. The upper GI endoscopy was                            accomplished without difficulty. The patient                            tolerated the procedure. Scope In: Scope Out: Findings:      No gross lesions were noted in the proximal esophagus.      Grade I & II, small (< 5 mm) varices were found in the mid esophagus and       in the distal esophagus. No stigmata of recent bleeding.      The Z-line was regular and was found 40 cm from the incisors.      Moderate portal hypertensive gastropathy was found in the cardia, in the       gastric fundus and in the gastric body.      Multiple dispersed, small non-bleeding erosions were found on the       anterior wall of the gastric antrum. There were no stigmata of recent       bleeding. Biopsies were taken with a cold forceps for Helicobacter       pylori testing from the antrum. To prevent bleeding after the biopsies,       two hemostatic clips were successfully placed (MR conditional) on each       of the areas. There was no bleeding at the end of the procedure.       Decision was made to not pursue any further biopsies  based on his       coagulation status.      No gross lesions were noted in the duodenal bulb, in the first portion       of the duodenum and in the second portion of the duodenum. Impression:               - No gross lesions in esophagus.                           - Grade I & II small (< 5 mm) esophageal varices.                            No stigmata of recent bleeding or high-risk.                           - Z-line regular, 40 cm from  the incisors.                           - Portal hypertensive gastropathy.                           - Non-bleeding erosive gastropathy. Biopsied. Clips                            (MR conditional) were placed.                           - No gross lesions in the duodenal bulb, in the                            first portion of the duodenum and in the second                            portion of the duodenum. Recommendation:           - The patient will be observed post-procedure,                            until all discharge criteria are met.                           - Return patient to hospital ward for ongoing care.                           - Observe patient's clinical course.                           - Await pathology results.                           - Send H. pylori IgG to evaluate for previous                            infection and if positive would proceed with  treatment.                           - Continue PPI BID.                           - Repeat EGD to be considered in future based on                            patient's status.                           - Regular diet is reasonable if patient feels up to                            it.                           - Mild dark stools may be noted for next 24-48                            hours from the biopsies.                           - The findings and recommendations were discussed                            with the patient.                           - The findings and recommendations were discussed                            with the patient's family.                           - The findings and recommendations were discussed                            with the referring physician. Procedure Code(s):        --- Professional ---                           [phone], Esophagogastroduodenoscopy, flexible,                            transoral; with biopsy, single or multiple Diagnosis Code(s):         --- Professional ---                           I85.00, Esophageal varices without bleeding                           K76.6, Portal hypertension                           K31.89, Other diseases of stomach and duodenum  R10.13, Epigastric pain CPT copyright 2018 American [employer] Association. All rights reserved. The codes documented in this report are preliminary and upon coder review may  be revised to meet current compliance requirements. Justice Britain, MD 03/03/2018 10:38:24 AM Number of Addenda: 0

## 2018-03-03 NOTE — Progress Notes (Addendum)
Subjective: [name]. Pt had EGD this AM which he tolerated well. Reports mild abdominal pain in the epigastric region, but states that pain is doing better today. Pain is intermittent in nature.  Also notes some leg swelling. Pt drinking water without issue during interview. No further questions or concerns at this time.  Objective:  Vital signs in last 24 hours: Vitals:   03/03/18 0024 03/03/18 0849 03/03/18 1027 03/03/18 1032  BP: (!) 94/47 (!) 97/44 (!) 84/28 (!) 92/47  Pulse: 71 65 77   Resp:  15 18   Temp: 98.9 F (37.2 C) 98.8 F (37.1 C) 97.9 F (36.6 C)   TempSrc: Oral Oral Oral   SpO2: 96% 97% 99%   Weight:      Height:       General: Chronically ill-appearing male sitting up in bed, alert, answers questions appropriately through use of daughter as interpreter, NAD CV: RRR, without murmurs, rubs, gallops Pulm: CTAB, no increased WOB, no accessory muscle use Abdomen: Soft, non-distended, tenderness to palpation in epigastric region, hyperactive BS NMM:HWKG, no pedal edema or erythema  Labs/Studies:  HIDA: Cystic duct with sluggish flow but no evidence of acute cholecystitic, chronic cholecystitis cannot be ruled out.  Assessment/Plan:  Active Problems:   Epigastric pain  Jose Snow is a 47 y/o male w/ PMHx significant for Cirrhosis 2/2 chronic Hep B infection, TB (treated), and DM II who presented with flare of ongoing burning epigastric pain. No associated n/v, diarrhea, association with eating. Initially there was a question of whether he had an acute bleed given FOBT+ and a significant hemoglobin drop from 12/2017, however further chart review indicates that his hemoglobin is consistent with levels in 11/2017. Do not suspect acute bleed at this time. CT abdomen and pelvis revealing advancing cirrhosis with small perihepatic ascites, splenomegaly, and paraumbilical varices, no acute processes. HIDA scan negative for acute cholecystitis. Clinical picture consistent with  gastritis flare - pt was previously seen by GI one month ago for the same, diagnosed with gastritis and started on Carafate and PPI at that time. Given lack of improvement on PPI high suspicion for H. Pylori infection. EGD with GI today revealing erosions in antrum. Will monitor today given low platelet count in setting of recent procedures.  Epigastric Pain: HIDA scan negative. EGD today with erosions visualized in antrum.  - Omeprazole 40 mg BID - Carafate solution 10 ml q6h - H. Pylori serologies  - F/u surgical path - GI following, recs appreciated  Cirrhosis 2/2 chronic Hep B: Chronic Hep B, on treatment with tenofovir since 08/2017, is on transplant list. Evidence of varices and portal hypertensive gastropathy on screening EGD in 10/2017 and started on Nadolol.  - Tenofovir 300 mg  - Nadolol 10 mg daily - Spironolactone 100 mg   Pancytopenia: Appears to be chronic, has been worked up with bone marrow bx in past, likely 2/2 cirrhosis. Hemoglobin stable at 10.1. Platelets decreased to 19 from 25 on admission. Will continue to monitor. - Repeat CBC tomorrow  DMII: Chronic, controlled on lantus and januvia at home. - SSI while inpatient  Dispo: Anticipated discharge in approximately 0-1 day(s).   Jose Snow, Medical Student 03/03/2018, 10:36 AM   I have seen and examined the patient, and reviewed the daily progress note by Jose Banda, MS 3 and discussed the care of the patient with them. I have made any necessary changes to the note.   SignedDelice Bison, DO 03/03/2018, 1:09 PM

## 2018-03-03 NOTE — Progress Notes (Signed)
  Date: 03/03/2018  Patient name: Jolene Provost [facility]  Medical record number: [id number]  Date of birth: 1970/04/10   I have seen and evaluated [place] Records and discussed their care with the Residency Team.  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  Patient states that epigastric pain began yesterday morning and is constant.  Pain is burning in nature and nonradiating with no aggravating or relieving factors.  He did have a previous episode of epigastric pain 2 days prior to admission which resolved spontaneously.  Patient also a history of recurrent epigastric pain for which she is following with the GI doctor and was started on Protonix for possible gastritis.  No chest pain, no palpitations, lightheadedness, no syncope, no nausea or vomiting, no diarrhea, no fevers or chills, no headache, no blurry vision, no focal weakness.  Today patient is status post EGD.  He states that his abdominal pain currently has improved.  Denies any other complaints at this time.  PMHx, Fam Hx, and/or Soc Hx : As per resident admit note  Vitals:   03/03/18 1037 03/03/18 1046  BP: (!) 92/41 (!) 102/53  Pulse: 78 76  Resp: 10 15  Temp:    SpO2: 96% 96%   General: Awake, alert, oriented x3, NAD CVS: Regular rate and rhythm, normal heart sounds Lungs: CTA bilaterally Abdomen: Soft, mild epigastric tenderness noted, no rebound/guarding, nondistended, normoactive bowel sounds Extremities: No edema noted  Assessment and Plan: I have seen and evaluated the patient as outlined above. I agree with the formulated Assessment and Plan as detailed in the residents' note, with the following changes:   1.  Acute gastritis: -Patient presented to the ED with recurrent epigastric pain in the setting of a history of cirrhosis as well as concern for  possible GI bleed given decreased hemoglobin noted on admission (10 down from 14 2 months ago).  Patient is now status post EGD which shows nonbleeding erosive gastropathy as well as portal hypertensive gastropathy and small esophageal varices. -GI follow-up and recommendations appreciated -Patient's hemoglobin has remained stable and there is no sign of an active GI bleed currently. -We will monitor CBC closely.  Patient has a history of chronic pancytopenia and appears to be close to baseline. -We will continue with PPI and Carafate for gastritis -We will follow-up H. pylori serologies and treat if positive -If hemoglobin remains stable and abdominal pain is improved patient should be stable for discharge home tomorrow. -No further work-up at this time.    Aldine Contes, MD 11/26/201912:40 PM

## 2018-03-03 NOTE — Transfer of Care (Signed)
Immediate Anesthesia Transfer of Care Note  Patient: Jose Snow  Procedure(s) Performed: ESOPHAGOGASTRODUODENOSCOPY (EGD) WITH PROPOFOL (N/A ) HEMOSTASIS CLIP PLACEMENT BIOPSY  Patient Location: Endoscopy Unit  Anesthesia Type:MAC  Level of Consciousness: awake and patient cooperative  Airway & Oxygen Therapy: Patient Spontanous Breathing and Patient connected to nasal cannula oxygen  Post-op Assessment: Report given to RN and Post -op Vital signs reviewed and stable  Post vital signs: Reviewed and stable  Last Vitals:  Vitals Value Taken Time  BP 84/28 03/03/2018 10:27 AM  Temp 36.6 C 03/03/2018 10:27 AM  Pulse 81 03/03/2018 10:29 AM  Resp 17 03/03/2018 10:29 AM  SpO2 99 % 03/03/2018 10:29 AM  Vitals shown include unvalidated device data.  Last Pain:  Vitals:   03/03/18 1027  TempSrc: Oral  PainSc: 0-No pain         Complications: No apparent anesthesia complications

## 2018-03-04 ENCOUNTER — Encounter: Payer: Self-pay | Admitting: Gastroenterology

## 2018-03-04 DIAGNOSIS — K253 Acute gastric ulcer without hemorrhage or perforation: Secondary | ICD-10-CM

## 2018-03-04 DIAGNOSIS — R198 Other specified symptoms and signs involving the digestive system and abdomen: Secondary | ICD-10-CM

## 2018-03-04 LAB — CBC
HCT: 29.6 % — ABNORMAL LOW (ref 39.0–52.0)
Hemoglobin: 9.9 g/dL — ABNORMAL LOW (ref 13.0–17.0)
MCH: 32.6 pg (ref 26.0–34.0)
MCHC: 33.4 g/dL (ref 30.0–36.0)
MCV: 97.4 fL (ref 80.0–100.0)
NRBC: 0 % (ref 0.0–0.2)
PLATELETS: 16 10*3/uL — AB (ref 150–400)
RBC: 3.04 MIL/uL — AB (ref 4.22–5.81)
RDW: 14.3 % (ref 11.5–15.5)
WBC: 1.9 10*3/uL — AB (ref 4.0–10.5)

## 2018-03-04 LAB — GLUCOSE, CAPILLARY
GLUCOSE-CAPILLARY: 99 mg/dL (ref 70–99)
GLUCOSE-CAPILLARY: 139 mg/dL — AB (ref 70–99)

## 2018-03-04 NOTE — Progress Notes (Signed)
Daily Rounding Note  03/04/2018, 11:41 AM  LOS: 2 days   SUBJECTIVE:   Chief complaint: epigastric pain.  Erosive gastritis.      Epigastric pain better, not completely resolved.   tolerating HH diet. No BM's.   Feels well enough to go home.     OBJECTIVE:         Vital signs in last 24 hours:    Temp:  [98 F (36.7 C)-98.5 F (36.9 C)] 98.1 F (36.7 C) (11/27 0740) Pulse Rate:  [59-72] 59 (11/27 0740) Resp:  [11-20] 14 (11/27 0740) BP: (88-93)/(50-54) 88/50 (11/27 0740) SpO2:  [93 %-98 %] 98 % (11/27 0740) Weight:  [58.2 kg] 58.2 kg (11/27 0224) Last BM Date: 03/02/18 Filed Weights   03/02/18 1839 03/04/18 0224  Weight: 56.2 kg 58.2 kg   General: looks well.  comfortable   Heart: RRR Chest: clear bil.   Abdomen: soft, NT, ND.  Active BS  Extremities: no CCE Neuro/Psych:  Alert.  Oriented x 3.    Intake/Output from previous day: 11/26 0701 - 11/27 0700 In: 300 [I.V.:300] Out: -   Intake/Output this shift: No intake/output data recorded.  Lab Results: Recent Labs    03/02/18 0507 03/03/18 0221 03/04/18 1046  WBC 3.3* 2.5* 1.9*  HGB 10.8* 10.1* 9.9*  HCT 32.8* 30.2* 29.6*  PLT 25* 19* 16*   BMET Recent Labs    03/02/18 0507 03/03/18 0221  NA 136 136  K 3.8 4.3  CL 107 105  CO2 24 26  GLUCOSE 81 166*  BUN 13 12  CREATININE 1.10 1.45*  CALCIUM 8.2* 7.9*   LFT Recent Labs    03/02/18 0507 03/03/18 0221  PROT 6.1* 5.4*  ALBUMIN 2.5* 2.3*  AST 45* 39  ALT 30 27  ALKPHOS 80 65  BILITOT 1.7* 2.1*   PT/INR Recent Labs    03/02/18 0942  LABPROT 18.6*  INR 1.57   Hepatitis Panel No results for input(s): HEPBSAG, HCVAB, HEPAIGM, HEPBIGM in the last 72 hours.  Studies/Results: Nm Hepatobiliary Liver Func  Result Date: 03/02/2018 CLINICAL DATA:  Epigastric and RIGHT upper quadrant pain, suspected cholecystitis, history cirrhosis and splenomegaly, diabetes mellitus EXAM: NUCLEAR  MEDICINE HEPATOBILIARY IMAGING TECHNIQUE: Sequential images of the abdomen were obtained out to 60 minutes following intravenous administration of radiopharmaceutical. RADIOPHARMACEUTICALS:  5.3 mCi Tc-92m Choletec IV Pharmaceutical: 2.2 mg morphine sulfate IV at 1 hour 20 minutes of imaging COMPARISON:  CT abdomen and pelvis 03/02/2018 FINDINGS: Delayed clearance of tracer from the bloodstream indicating hepatocellular dysfunction. Liver is small with note of splenomegaly, with prolonged tracer retention in splenic blood pool. Common bile duct is seen by 32 minutes. Small amount of tracer is seen within the small bowel by 75 minutes. Prolonged hepatic retention of tracer. At 80 minutes patient received morphine and imaging was continued for 30 minutes. Tracer is identified within a structure RIGHT lateral to the common bile duct beginning at 85 minutes, increased until into of exam. This likely represents tracer within the medial aspect of the distended gallbladder. IMPRESSION: Impaired hepatocellular function with delayed clearance of tracer from bloodstream and impaired excretion of tracer into biliary tree. Faint tracer visualization within gallbladder only following morphine augmentation indicating patency of the common bile duct. Findings are not consistent with acute cholecystitis but may represent chronic cholecystitis. Electronically Signed   By: MLavonia DanaM.D.   On: 03/02/2018 18:15   Scheduled Meds: . cetirizine  10  mg Oral QPM  . feeding supplement (GLUCERNA SHAKE)  237 mL Oral BID BM  . insulin aspart  0-9 Units Subcutaneous TID WC  . insulin glargine  9 Units Subcutaneous QHS  . multivitamin with minerals  1 tablet Oral Daily  . nadolol  10 mg Oral Daily  . pantoprazole  40 mg Oral BID  . spironolactone  100 mg Oral Daily  . sucralfate  1 g Oral QID  . tenofovir  300 mg Oral Daily   Continuous Infusions: PRN Meds:.acetaminophen **OR** acetaminophen, senna-docusate  ASSESMENT:   *    Epigastric pain.  Elevated t bili.   CT reveals cirrhosis, small ascites, no liver masses, splenomegaly, paraumbilical varices.  Previous ultrasound showed GB sludge.  03/02/18 HIDA: patent cystic duct though sluggish flow, impaired hepatocellular dysfx. Overall findings not reflective of acute cholecystitis but not able to r/o chronic cholecystitis. 03/03/2018 EGD.  Grade 1 and 2, small, esophageal varices without signs of recent bleeding.  Portal hypertensive gastropathy.  Nonbleeding erosive gastropathy was biopsied and endoclips applied to the biopsy site. Pain worse when laying on right side, wonder if splenomegaly could be causing discomfort?   *  Chronic pancytopenia.   Though Hgb down 4 gm from 2 months ago, it is within historic rr present 3 months ago. No blood transfusions this admission.    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  On tenofovir.   Child's class B.     *   Hx lymphocytic colitis.    PLAN   *   Awaiting serum H. pylori antigen tests and path report.  . Continue twice daily PPI. Consider future EGD to assess for healing. Expect to see dark stools for the next couple of days and biopsy associated bleeding. Pain might not be controlled despite PPI therapy.   fup with Dr Ardis Hughs in early Jan 2020.    *   If we need to get in touch with pt, his contacts who can speak [place] are his dtrs Liem at [phone] and Brai (prononunced bry) at [place]  03/04/2018, 11:41 AM Phone [phone]

## 2018-03-04 NOTE — Care Management Note (Signed)
Case Management Note  Patient Details  Name: Jose Snow MRN: [id number] Date of Birth: [birth year]  Subjective/Objective:                    Action/Plan: Pt discharging home with self care. No needs per CM.  Expected Discharge Date:  03/04/18               Expected Discharge Plan:  Home/Self Care  In-House Referral:     Discharge planning Services  CM Consult  Post Acute Care Choice:    Choice offered to:     DME Arranged:    DME Agency:     HH Arranged:    HH Agency:     Status of Service:  Completed, signed off  If discussed at [employer] of Stay Meetings, dates discussed:    Additional Comments:  Pollie Friar, RN 03/04/2018, 12:40 PM

## 2018-03-04 NOTE — Progress Notes (Signed)
Chaplain VW verified the notary is not able to complete AD today.  Chaplain VW will update the Pt. on his options for completion and education.

## 2018-03-04 NOTE — Anesthesia Postprocedure Evaluation (Signed)
Anesthesia Post Note  Patient: Jose Snow  Procedure(s) Performed: ESOPHAGOGASTRODUODENOSCOPY (EGD) WITH PROPOFOL (N/A ) HEMOSTASIS CLIP PLACEMENT BIOPSY     Patient location during evaluation: Endoscopy Anesthesia Type: MAC Level of consciousness: awake and alert Pain management: pain level controlled Vital Signs Assessment: post-procedure vital signs reviewed and stable Respiratory status: spontaneous breathing, nonlabored ventilation, respiratory function stable and patient connected to nasal cannula oxygen Cardiovascular status: blood pressure returned to baseline and stable Postop Assessment: no apparent nausea or vomiting Anesthetic complications: no    Last Vitals:  Vitals:   03/04/18 0223 03/04/18 0740  BP: (!) 93/53 (!) 88/50  Pulse: 64 (!) 59  Resp: 20 14  Temp: 36.9 C 36.7 C  SpO2: 93% 98%    Last Pain:  Vitals:   03/04/18 0740  TempSrc: Oral  PainSc:                  [name]

## 2018-03-04 NOTE — Discharge Summary (Signed)
Name: Jose Snow MRN: [id number] DOB: Oct 24, 1970 47 Jose.o. PCP: Horald Pollen, MD  Date of Admission: 03/02/2018  4:40 AM Date of Discharge: 11/27/201911/27/2019 Attending Physician: No att. providers found  Discharge Diagnosis: 1. Gastritis  2. 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 Pancytopenia   Discharge Medications: Allergies as of 03/04/2018   No Known Allergies     Medication List    STOP taking these medications   furosemide 40 MG tablet Commonly known as:  LASIX     TAKE these medications   insulin glargine 100 UNIT/ML injection Commonly known as:  LANTUS Inject 9 Units into the skin daily.   lactulose 10 GM/15ML solution Commonly known as:  CHRONULAC Take 45 mLs (30 g total) by mouth daily.   levocetirizine 5 MG tablet Commonly known as:  XYZAL TAKE 1 TABLET BY MOUTH EVERY EVENING   nadolol 20 MG tablet Commonly known as:  CORGARD Take 0.5 tablets (10 mg total) by mouth daily. What changed:  how much to take   omeprazole 40 MG capsule Commonly known as:  PRILOSEC Take 1 capsule (40 mg total) by mouth 2 (two) times daily.   sitaGLIPtin 25 MG tablet Commonly known as:  JANUVIA Take 1 tablet (25 mg total) by mouth daily.   spironolactone 100 MG tablet Commonly known as:  ALDACTONE Take 1 tablet (100 mg total) by mouth daily.   sucralfate 1 GM/10ML suspension Commonly known as:  CARAFATE Take 10 mLs (1 g total) by mouth 4 (four) times daily. Take between meals and at bedtime.   tenofovir 300 MG tablet Commonly known as:  VIREAD Take 300 mg by mouth daily.     Disposition and follow-up:   [name] was discharged from [facility] in Stable condition.  At the hospital follow up visit please address:  1.  Gastritis. Continue the patient's PPI and Carafate. Cirrhosis. Did have some ascites on CT, could consider restarting the patient's furosemide and uptitrating both the furosemide and spironolactone.   2.  Labs /  imaging needed at time of follow-up: None  3.  Pending labs/ test needing follow-up: None  Follow-up Appointments: Follow-up Information    Milus Banister, MD Follow up on 04/17/2018.   Specialty:  Gastroenterology Why:  2:15 PM.   follow up for stomach and liver problems Contact information: [address] [address] [phone]         Hospital Course by problem list:  Gastritis. 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 CT abdomen and pelvis revealing advancing cirrhosis with small perihepatic ascites, splenomegaly, and paraumbilical varices, no acute processes. HIDA scan negative for acute cholecystitis. Clinical picture consistent with gastritis flare - pt previously diagnosed with gastritis but no improvement on on Carafate and PPI. EGD revealing of erosions in antrum. Given ongoing symptoms despite PPI, and location of erosions, there is possibility of H. Pylori infection - serologies are pending but biopsies were negative. He was discharged in stable condition to follow-up with his PCP and GI.   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 Chronic Hep B, on treatment with tenofovir since 08/2017, is on transplant list. Evidence of varices and portal hypertensive gastropathy on screening EGD in 10/2017 and started on Nadolol. Continued Tenofovir, nadolol, and spironolactone on discharge. Follow-up with GI.   Pancytopenia. Secondary to cirrhosis. Stable  throughout hospitalization.   Discharge Vitals:   BP (!) 88/50 (BP Location: Left Arm)   Pulse (!) 59   Temp 98.1 F (36.7 C) (Oral)   Resp 14   Ht 5' 2"  (1.575 m)   Wt 58.2 kg   SpO2 98%   BMI 23.47 kg/m   Pertinent Labs, Studies, and Procedures:   CT Abdomen 03/02/2018  1. Advanced cirrhosis.  No liver mass on this noncontrast CT. 2. New small volume perihepatic ascites. Moderate  splenomegaly and moderate paraumbilical varices are stable. 3. No evidence of bowel obstruction or acute bowel inflammation.  HIDA Scan 03/02/2018 Impaired hepatocellular function with delayed clearance of tracer from bloodstream and impaired excretion of tracer into biliary tree.  Faint tracer visualization within gallbladder only following morphine augmentation indicating patency of the common bile duct.  Findings are not consistent with acute cholecystitis but may represent chronic cholecystitis.  EGD 03/03/2018 - No gross lesions in esophagus. - Grade I & II small (< 5 mm) esophageal varices. No stigmata of recent bleeding or highrisk. - Z-line regular, 40 cm from the incisors. - Portal hypertensive gastropathy. - Non-bleeding erosive gastropathy. Biopsied. Clips (MR conditional) were placed. - No gross lesions in the duodenal bulb, in the first portion of the duodenum and in the second portion of the duodenum.  Discharge Instructions: Discharge Instructions    Call MD for:  persistant dizziness or light-headedness   Complete by:  As directed    Call MD for:  persistant nausea and vomiting   Complete by:  As directed    Call MD for:  severe uncontrolled pain   Complete by:  As directed    Call MD for:  temperature >100.4   Complete by:  As directed    Diet - low sodium heart healthy   Complete by:  As directed    Discharge instructions   Complete by:  As directed    Jose Snow, it was a pleasure taking care of you. I am glad you are feeling better. Your endoscopy showed some inflammation in your stomach which is what is causing your pain. You are on the right medicines to help with this. Continue taking the Omeprazole twice daily, as well as the Carafate 4x daily. We are testing for a bacteria that may be contributing to your symptoms and will add on antibiotics if the blood test comes back positive.  Please make an appointment to follow-up with your GI doctor in 2 weeks,  as well as your primary doctor within the next 1-2 weeks to follow-up.  Take care and have a great Thanksgiving!   Increase activity slowly   Complete by:  As directed     Signed: Ina Homes, MD 03/05/2018, 9:27 AM

## 2018-03-04 NOTE — Progress Notes (Addendum)
   Subjective: Pt reported 9/10 epigastric pain overnight, treated with GI cocktail with good effect. Denies any pain this morning, states that it comes and goes. Discussed plan with patient and daughter (who translated) that we are checking for a bacteria in the stomach and will call in a medicine if it comes back positive. Will recheck to make sure blood counts are stable prior to discharge. Family and patient voice no further concerns or questions at this time.   Objective:  Vital signs in last 24 hours: Vitals:   03/03/18 2325 03/04/18 0223 03/04/18 0224 03/04/18 0740  BP: (!) 90/54 (!) 93/53  (!) 88/50  Pulse: 64 64  (!) 59  Resp: 20 20  14   Temp: 98.3 F (36.8 C) 98.5 F (36.9 C)  98.1 F (36.7 C)  TempSrc:    Oral  SpO2: 95% 93%  98%  Weight:   58.2 kg   Height:       General: Chronically ill-appearing male lying in bed, alert, answers questions appropriately through use of daughter as interpreter, NAD CV: RRR, without murmurs, rubs, gallops Pulm: CTAB, no increased WOB, no accessory muscle use Abdomen: Soft, non-distended, no tenderness to palpation Ext: Warm, no pedal edema or erythema  Labs/Studies:  CBC pending  Assessment/Plan:  Active Problems:   Epigastric pain   Malnutrition of moderate degree  Jose Snow is a 47 y/o male w/ PMHx significant for Cirrhosis 2/2 chronic Hep B infection, TB (treated), and DM II who presented with a flare of ongoing burning epigastric pain. CT abdomen and pelvis revealing advancing cirrhosis with small perihepatic ascites, splenomegaly, and paraumbilical varices, no acute processes. HIDA scan negative for acute cholecystitis. Clinical picture consistent with gastritis flare - pt previously diagnosed with gastritis but no improvement on on Carafate and PPI. EGD revealing erosions in antrum. Given ongoing symptoms despite PPI, and location of erosions, there is possibility of H. Pylori infection - serologies pending. Plan to discharge  home with close follow up.  Epigastric Pain:  - Omeprazole 40 mg BID - Carafate solution 10 ml q6h - F/u H. Pylori serologies  - F/u surgical path - outpatient follow-up with GI  Cirrhosis 2/2 chronic Hep B: Chronic Hep B, on treatment with tenofovir since 08/2017, is on transplant list. Evidence of varices and portal hypertensive gastropathy on screening EGD in 10/2017 and started on Nadolol.  - Tenofovir 300 mg  - Nadolol 10 mg daily - Spironolactone 100 mg   Pancytopenia: Appears to be chronic, has been worked up with bone marrow bx in past, likely 2/2 cirrhosis. Hemoglobin stable at 9.9. Platelet count 16; baseline ~ 20. Stable for discharge. Will have him get a repeat CBC at PCP follow-up.   DMII: Chronic, controlled on lantus and januvia at home.  Dispo: Anticipated discharge home today.   Jose Snow, Medical Student 03/04/2018, 10:51 AM   I have seen and examined the patient, and reviewed the daily progress note by Alesia Banda, MS 3 and discussed the care of the patient with them. I have made any necessary changes to the note.   SignedDelice Bison, DO 03/04/2018, 12:54 PM

## 2018-03-04 NOTE — Progress Notes (Signed)
   03/04/18 0950  Clinical Encounter Type  Visited With Patient and family together  Visit Type Follow-up  Referral From Chaplain  Consult/Referral To Chaplain;Other (Comment) (AD)  The chaplain followed up on spiritual care consult for AD.  The Pt. was sitting up in bed when the chaplain arrived. Pt. daughter-Leam was present in the room.  The AD conversation with the Pt. was supported by the daughter.  The chaplain was able to understand the Pt. desired his daughter-Leam to be HCPOA because of Pt. wife language barrier.  The Pt. identified language spoken as [place].  The Pt. identified a physical interpreter used in his ED.  The RN-Michelle is inquiring about the service for the Pt. to complete the AD.  The chaplain will F/U with RN for next steps.

## 2018-03-04 NOTE — Progress Notes (Signed)
Patient continues to complain of epigastric pain rating it 9/10. Does not want tylenol that has ordered PRN.

## 2018-03-04 NOTE — Progress Notes (Signed)
Chaplain Karlin Heilman received referral from [employer] for Advanced Directive help.  Pt was sitting up in bed and his daughter was bedside. Pt understood some English but daughter spoke with chaplain about the AD.  Chaplain explained that a notary was not available today, but that the AD could be completed post-discharge.  Chaplain called the Interpreter services but the rare language of the [place] patient was not part of the translation services. Chaplain explained AD to daughter in detail.  Pt was discharged later in the day. Tamsen Snider Pager [phone]

## 2018-03-09 LAB — H PYLORI, IGM, IGG, IGA AB
H Pylori IgG: 0.29 Index Value (ref 0.00–0.79)
H. Pylogi, Iga Abs: 9.3 units — ABNORMAL HIGH (ref 0.0–8.9)
H. Pylogi, Igm Abs: <9 units (ref 0.0–8.9)

## 2018-03-11 ENCOUNTER — Telehealth: Payer: Self-pay

## 2018-03-11 MED ORDER — BIS SUBCIT-METRONID-TETRACYC 140-125-125 MG PO CAPS: 3.0000 | ORAL_CAPSULE | Freq: Three times a day (TID) | ORAL | 0 refills | Status: DC

## 2018-03-11 NOTE — Telephone Encounter (Signed)
-----   Message from Irving Copas., MD sent at 03/11/2018 12:03 PM EST ----- Thanks Larkin Ina for reaching out. Interesting, potentially the PPI could have masked [place] finding on biopsies. Either way, since not previously treated, I think it would be reasonable to pursue therapy in the setting of a positive HP serology in the case of the significant disease that he had. He is Dr. Eugenia Pancoast patient. Linna Hoff, if you are OK with this plan, I will ask Prentiss Hammett to try and reach the patient/family and get him Pylera in addition to his PPI, and we should plan a 14-day treatment. He will need a special translator for him and family to understand but I think, as he has never been treated previously, again worthwhile to consider. Gabe ----- Message ----- From: Ina Homes, MD Sent: 03/10/2018   2:37 PM EST To: Irving Copas., MD  Hey Dr. Rush Landmark,   We took care of this patient in the hospital together and his H. Pylori serology came back positive. His biopsies were negative but I do not see any records indicating that he previously received therapy for H. Pylori. He does not follow with our clinic but I wanted to ensure these results were seen.   Please let me know your thoughts. I am more than happy to give him a call to start quad therapy I just need someone to follow him up after he has received treatment. Let me know.   Thank you for your help.  Larkin Ina

## 2018-03-12 NOTE — Telephone Encounter (Signed)
Left message on machine to call back  

## 2018-03-13 NOTE — Telephone Encounter (Signed)
Left message on machine to call back  

## 2018-03-16 ENCOUNTER — Telehealth: Payer: Self-pay | Admitting: Gastroenterology

## 2018-03-16 NOTE — Telephone Encounter (Signed)
See alternate phone note

## 2018-03-16 NOTE — Telephone Encounter (Signed)
[place] pt translator.  Left message on machine to call back

## 2018-03-16 NOTE — Telephone Encounter (Signed)
The patient has been notified of this information and all questions answered. He will pick up the contrast from the pharmacy.

## 2018-03-16 NOTE — Telephone Encounter (Signed)
Jose Snow returning Call

## 2018-04-13 DIAGNOSIS — E119 Type 2 diabetes mellitus without complications: Secondary | ICD-10-CM | POA: Diagnosis not present

## 2018-04-13 DIAGNOSIS — Z681 Body mass index (BMI) 19 or less, adult: Secondary | ICD-10-CM | POA: Diagnosis not present

## 2018-04-13 DIAGNOSIS — K7460 Unspecified cirrhosis of liver: Secondary | ICD-10-CM | POA: Diagnosis not present

## 2018-04-17 ENCOUNTER — Other Ambulatory Visit (INDEPENDENT_AMBULATORY_CARE_PROVIDER_SITE_OTHER): Payer: 59

## 2018-04-17 ENCOUNTER — Ambulatory Visit: Payer: 59 | Admitting: Gastroenterology

## 2018-04-17 ENCOUNTER — Encounter: Payer: Self-pay | Admitting: Gastroenterology

## 2018-04-17 VITALS — BP 120/70 | HR 78 | Ht 62.25 in | Wt 127.0 lb

## 2018-04-17 DIAGNOSIS — K7460 Unspecified cirrhosis of liver: Secondary | ICD-10-CM

## 2018-04-17 LAB — COMPREHENSIVE METABOLIC PANEL
ALT: 24 U/L (ref 0–53)
AST: 30 U/L (ref 0–37)
Albumin: 3.3 g/dL — ABNORMAL LOW (ref 3.5–5.2)
Alkaline Phosphatase: 80 U/L (ref 39–117)
BUN: 18 mg/dL (ref 6–23)
CHLORIDE: 104 meq/L (ref 96–112)
CO2: 26 meq/L (ref 19–32)
CREATININE: 1.14 mg/dL (ref 0.40–1.50)
Calcium: 9.1 mg/dL (ref 8.4–10.5)
GFR: 72.26 mL/min (ref >60.00)
GLUCOSE: 99 mg/dL (ref 70–99)
Potassium: 4.2 mEq/L (ref 3.5–5.1)
Sodium: 138 mEq/L (ref 135–145)
Total Bilirubin: 2.8 mg/dL — ABNORMAL HIGH (ref 0.2–1.2)
Total Protein: 6.7 g/dL (ref 6.0–8.3)

## 2018-04-17 LAB — PROTIME-INR
INR: 1.5 ratio — ABNORMAL HIGH (ref 0.8–1.0)
Prothrombin Time: 17.3 s — ABNORMAL HIGH (ref 9.6–13.1)

## 2018-04-17 LAB — CBC WITH DIFFERENTIAL/PLATELET
BASOS PCT: 0.7 % (ref 0.0–3.0)
Basophils Absolute: 0 10*3/uL (ref 0.0–0.1)
Eosinophils Absolute: 0.1 10*3/uL (ref 0.0–0.7)
Eosinophils Relative: 4.8 % (ref 0.0–5.0)
HEMATOCRIT: 34.3 % — AB (ref 39.0–52.0)
Hemoglobin: 11.8 g/dL — ABNORMAL LOW (ref 13.0–17.0)
Lymphocytes Relative: 24.6 % (ref 12.0–46.0)
Lymphs Abs: 0.5 10*3/uL — ABNORMAL LOW (ref 0.7–4.0)
MCHC: 34.3 g/dL (ref 30.0–36.0)
MCV: 95.5 fl (ref 78.0–100.0)
MONOS PCT: 10.9 % (ref 3.0–12.0)
Monocytes Absolute: 0.2 10*3/uL (ref 0.1–1.0)
NEUTROS ABS: 1.3 10*3/uL — AB (ref 1.4–7.7)
Neutrophils Relative %: 59 % (ref 43.0–77.0)
RBC: 3.6 Mil/uL — ABNORMAL LOW (ref 4.22–5.81)
RDW: 15.7 % — AB (ref 11.5–15.5)
WBC: 2.1 10*3/uL — ABNORMAL LOW (ref 4.0–10.5)

## 2018-04-17 MED ORDER — NADOLOL 20 MG PO TABS: 20.0000 mg | ORAL_TABLET | Freq: Every day | ORAL | 6 refills | Status: DC

## 2018-04-17 MED ORDER — OMEPRAZOLE 40 MG PO CPDR: 40.0000 mg | DELAYED_RELEASE_CAPSULE | Freq: Every day | ORAL | 6 refills | Status: DC

## 2018-04-17 NOTE — Patient Instructions (Addendum)
You will have labs checked today in the basement lab.  Please head down after you check out with the front desk  (INR, CBC, cmet, AFP).  You will be set up for an ultrasound of liver for hepatoma screening.  Will refill nadolol 77m once daily, one month with 6 refills.  Please return to see Dr. JArdis Hughsin 3 months.  Change prilosec scrpt to 436monce daily (shortly before breakfast meal).  Start pepcid (OTC famotidine) 2050mills, one pill at bedtime every night.  [facility] on 217/20 at 9amChelsealease arrive 15 minutes prior to your appointment for registration. Make certain not to have anything to eat or drink 6 hours prior to your appointment. Should you need to reschedule your appointment, please contact radiology at [phone] test typically takes about 30 minutes to perform.  Thank you for entrusting me with your care and choosing [facility]

## 2018-04-17 NOTE — Progress Notes (Signed)
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 Lurlean Leyden   Thrombocytopenia (Plts around 20K), mild ascites, signs of portal HTN on imaging  Labs 2019:Hepatitis B E antibody reactive,hepatitis B core total antibody reactive,hepatitis B surface antigen reactive,hepatitis B E antigen nonreactive (2 years ago this was positive)hepatitis B surface antibody Nonreactive,hepatitis B DNA 1,200,000 IUs/mL, HIV negative  Cirrhosis etiology workup:Labs 2016:Ferritin slightly elevated,HIV negative, hepatitis C virus IgG negative, anti-smooth muscle antibody IgG negative, ANA negative, HCV Ab negative, ceruloplamin 12.3 (slightly low), A1A normal.  Current MELD: 10/2017 18; 02/2018 MELDNa 19  EGD 10/2017, [name]; small to medium sized esophageal varices, + portal gastropathy. Started nadolol 30m daily.  EGD November 2019 while inpatient for drop in hemoglobin and abdominal pain found the same as above as well as multiple erosions.  Biopsies were taken and he was H. pylori negative.  He was put on antibiotics for H. pylori given serologic positive testing (pylera)  AFP 09/2017: 13.7  Liver imaging 08/2017 CT without IV contrast: cirrhosis, splenomegaly, mild ascites, signs of portal HTN.  02/2018 CT scan wo IV contrast cirrhosis with no masses.  [facility]2017 Dr. ZLurlean Leyden 2. Lymphocytic colitis: colonoscopy 10/2017 Dr. JArdis Hughsfor chronic diarrhea: hemorrhoids, o/w normal. Random biopsies + lymphocytic colitis. Started on budesonide 972mdaily.  HPI: This is a very pleasant [age]ear old man whom I last saw several months ago.  He is here with a professional Vi[place]-Bissaunterpreter and his close family friend.  Interpreter present, helps.  Still thinkgs probably lost in translation.  Still has pain in epigastrium, it is intermittent.  Ongoing cardiology workup as [facility].    [facility].  Problem with his prilosec, was to be on 4061mid.    Currently taking the pylera regimen, on day 4/14  He's been coughing for 3 months.  Chief complaint is hepatitis B cirrhosis  ROS: complete GI ROS as described in HPI, all other review negative.  Constitutional:  No unintentional weight loss   Past Medical History:  Diagnosis Date  . [place] . [place] ([place] . Hepatitis B   . Lymphocytic colitis   . 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     Past Surgical History:  Procedure Laterality Date  . BIOPSY  10/23/2017   Procedure: BIOPSY;  Surgeon: JacMilus BanisterD;  [facility];  Service: Endoscopy;;  . BIOPSY  03/03/2018   Procedure: BIOPSY;  Surgeon: ManIrving CopasMD;  [facility];;  . COLONOSCOPY WITH PROPOFOL N/A 10/23/2017   Procedure: COLONOSCOPY WITH PROPOFOL;  Surgeon: JacMilus BanisterD;  [facility];  Service: Endoscopy;  Laterality: N/A;  . COMPLEX WOUND CLOSURE Right 12/15/2015   Procedure: COMPLEX WOUND CLOSURE;  Surgeon: KevLeanora CoverD;  [facility] [place];  Laterality: Right;  . ESOPHAGOGASTRODUODENOSCOPY (EGD) WITH PROPOFOL N/A 10/23/2017   Procedure: ESOPHAGOGASTRODUODENOSCOPY (EGD) WITH PROPOFOL;  Surgeon: JacMilus BanisterD;  [facility];  Service: Endoscopy;  Laterality: N/A;  . ESOPHAGOGASTRODUODENOSCOPY (EGD) WITH PROPOFOL N/A 03/03/2018   Procedure: ESOPHAGOGASTRODUODENOSCOPY (EGD) WITH PROPOFOL;  Surgeon: ManRush LandmarkbTelford NabMD;  [facility] [place];  Laterality: N/A;  . I&D EXTREMITY Right 12/15/2015   Procedure: IRRIGATION AND DEBRIDEMENT AND REVISION AMPUTATION RIGHT RING FINGER;  Surgeon: KevLeanora CoverD;  [facility] [place]  Service: Orthopedics;  Laterality: Right;    Current Outpatient Medications  Medication Sig Dispense  Refill  . insulin glargine (LANTUS) 100 UNIT/ML injection Inject 9 Units into the skin daily.    Marland Kitchen lactulose (CHRONULAC) 10 GM/15ML solution Take 45 mLs (30 g total) by mouth daily. 240 mL 3  . levocetirizine (XYZAL) 5 MG tablet TAKE 1 TABLET BY MOUTH EVERY EVENING (Patient taking differently: Take 5 mg by mouth every evening. ) 30 tablet 0  . nadolol (CORGARD) 20 MG tablet Take 0.5 tablets (10 mg total) by mouth daily. (Patient taking differently: Take 20 mg by mouth daily. ) 30 tablet 3  . omeprazole (PRILOSEC) 20 MG capsule Take 20 mg by mouth 2 (two) times daily before a meal.    . sitaGLIPtin (JANUVIA) 25 MG tablet Take 1 tablet (25 mg total) by mouth daily. 30 tablet 5  . sucralfate (CARAFATE) 1 GM/10ML suspension Take 10 mLs (1 g total) by mouth 4 (four) times daily. Take between meals and at bedtime. 420 mL 3  . tenofovir (VIREAD) 300 MG tablet Take 300 mg by mouth daily.    Marland Kitchen bismuth-metronidazole-tetracycline (PYLERA) 140-125-125 MG capsule Take 3 capsules by mouth 4 (four) times daily -  before meals and at bedtime for 14 days. 168 capsule 0  . spironolactone (ALDACTONE) 100 MG tablet Take 1 tablet (100 mg total) by mouth daily. 90 tablet 3   No current facility-administered medications for this visit.     Allergies as of 04/17/2018  . (No Known Allergies)    Family History  Problem Relation Age of Onset  . Colon cancer Neg Hx   . Esophageal cancer Neg Hx   . Pancreatic cancer Neg Hx   . Stomach cancer Neg Hx   . Liver disease Neg Hx     Social History   Socioeconomic History  . Marital status: Married    Spouse name: Not on file  . Number of children: 4  . Years of education: Not on file  . Highest education level: Not on file  Occupational History  . 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 Financial resource strain: Not on file  . Food insecurity:    Worry: Not on file    Inability: Not on file  . Transportation needs:    Medical:  Not on file    Non-medical: Not on file  Tobacco Use  . Smoking status: Former Smoker    Packs/day: 0.50    Years: 8.00    Pack years: 4.00    Types: Cigarettes    Last attempt to quit: 04/08/1990    Years since quitting: 28.0  . Smokeless tobacco: Never Used  Substance and Sexual Activity  . Alcohol use: No  . Drug use: No  . Sexual activity: Yes  Lifestyle  . Physical activity:    Days per week: Not on file    Minutes per session: Not on file  . Stress: Not on file  Relationships  . Social connections:    Talks on phone: Not on file    Gets together: Not on file    Attends religious service: Not on file    Active member of club or organization: Not on file    Attends meetings of clubs or organizations: Not on file    Relationship status: Not on file  . Intimate partner violence:    Fear of current or ex partner: Not on file  Emotionally abused: Not on file    Physically abused: Not on file    Forced sexual activity: Not on file  Other Topics Concern  . 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  Lives with wife and four children.      Physical Exam: BP 120/70   Pulse 78   Ht 5' 2.25" (1.581 m) Comment: without shoes  Wt 127 lb (57.6 kg)   BMI 23.04 kg/m  Constitutional: generally well-appearing Psychiatric: alert and oriented x3 Abdomen: soft, nontender, nondistended, no obvious ascites, no peritoneal signs, normal bowel sounds No peripheral edema noted in lower extremities  Assessment and plan: 48 y.o. male with hep B cirrhosis decompensated liver disease  Need to restage his liver disease with blood test.  Need to rescreen him for development of hepatocellular cancer with alpha-fetoprotein and liver ultrasound.  We will arrange for all of that.  He seems to be tolerating his nadolol 20 mg much better lately.  Blood pressure and heart rate appropriate.  He is not having hypotensive spells anymore.  I am hoping some of his epigastric pains  improve as he completes his H. pylori eradication antibiotics, Pylera.  He is on day 4 of 14.  I do not know that he needs twice daily proton pump inhibitor.  I have asked that he changed to omeprazole 40 mg once daily shortly before breakfast and then take over-the-counter famotidine 20 mg at bedtime every night.  He is plugged [facility] with [facility], has an appointment later this month as follow-up.  His current meld sodium score based on labs 2 months ago is 19.  Will recalculate that again with new labs.  He will follow-up with me in 3 months and sooner if any issues.  Please see the "Patient Instructions" section for addition details about the plan.  Owens Loffler, MD [facility] 04/17/2018, 2:29 PM

## 2018-04-20 LAB — AFP TUMOR MARKER: AFP-Tumor Marker: 21.1 ng/mL — ABNORMAL HIGH (ref <6.1)

## 2018-04-24 ENCOUNTER — Ambulatory Visit (HOSPITAL_COMMUNITY): Payer: 59

## 2018-04-24 ENCOUNTER — Telehealth: Payer: Self-pay | Admitting: Gastroenterology

## 2018-04-24 DIAGNOSIS — R772 Abnormality of alphafetoprotein: Secondary | ICD-10-CM

## 2018-04-24 DIAGNOSIS — C220 Liver cell carcinoma: Secondary | ICD-10-CM

## 2018-04-24 DIAGNOSIS — K7460 Unspecified cirrhosis of liver: Secondary | ICD-10-CM

## 2018-04-24 NOTE — Telephone Encounter (Signed)
Left message on machine to call back    You have been scheduled for an MRI at [facility] on 04/30/18. Your appointment time is 12 pm. Please arrive 30 minutes prior to your appointment time for registration purposes. Please make certain not to have anything to eat or drink 4 hours prior to your test. In addition, if you have any metal in your body, have a pacemaker or defibrillator, please be sure to let your ordering physician know. This test typically takes 45 minutes to 1 hour to complete. Should you need to reschedule, please call [phone] to do so.

## 2018-04-24 NOTE — Telephone Encounter (Signed)
Pt called and had concerns why his appt was cancel for [facility] ct scan today.

## 2018-04-24 NOTE — Telephone Encounter (Signed)
Milus Banister, MD  Timothy Lasso, RN          Please call the patient. His AFP is rising (liver cancer tumor marker) and so please cancel his upcoming [place] (will not trust it if it is negative) and instead he needs an MRI of the liver for AFP elevation, cirrhosis, hepatoma screening. Thanks

## 2018-04-24 NOTE — Telephone Encounter (Signed)
The patient interpreter has been notified of this information and all questions answered.

## 2018-04-27 ENCOUNTER — Telehealth: Payer: Self-pay | Admitting: Gastroenterology

## 2018-04-27 NOTE — Telephone Encounter (Signed)
Informed [name] that when patient was in 04/17/18 a prescription was sent to patients pharmacy with 6 refills

## 2018-04-27 NOTE — Telephone Encounter (Signed)
Spoke to pharmacist.prescription for nadolol is at the pharmacy. Jose Snow has been notified.

## 2018-04-27 NOTE — Telephone Encounter (Signed)
Pt requested a refill for Nadolol sent to [facility] on [address].

## 2018-04-27 NOTE — Telephone Encounter (Signed)
Patient friend [name] calling back stating the pharmacy never got the prescription and they are faxing over a refill request. Juluis Rainier

## 2018-04-28 ENCOUNTER — Telehealth: Payer: Self-pay | Admitting: Gastroenterology

## 2018-04-28 ENCOUNTER — Ambulatory Visit (HOSPITAL_COMMUNITY): Payer: 59

## 2018-04-28 NOTE — Telephone Encounter (Signed)
Patients friend [name] states pt MRI was canceled for today due to no prior auth done. They want to know how to resch the MRI. [name] also states pt is staring to cough persistently and thinks Dr.Jacobs helped the pt with that by prescribing medication furofemide, in the past. They want to know if that can be prescribed again.

## 2018-04-28 NOTE — Telephone Encounter (Signed)
Mason Jim, Amy Salina April, Craig M Cc: Timothy Lasso, RN        Hello [place],  I have received Josem Kaufmann for this pt. He can be rescheduled now. I wish it would have come back yesterday, but [employer] was closed for the holiday.  Have a nice day!  Amy     Left message on machine to call back   You have been scheduled for an MRI at [facility] on 05/04/18. Your appointment time is 10 am. Please arrive 30 minutes prior to your appointment time for registration purposes. Please make certain not to have anything to eat or drink 6 hours prior to your test. In addition, if you have any metal in your body, have a pacemaker or defibrillator, please be sure to let your ordering physician know. This test typically takes 45 minutes to 1 hour to complete. Should you need to reschedule, please call [phone] to do so.

## 2018-04-28 NOTE — Telephone Encounter (Signed)
The pt was rescheduled and [name] (friend and interpreter) was given the information.  [name] states that the pt has increasing cough and is concerned.  She says he was previously on furosemide and was taken off at some point.  She is not sure by who.  Please advise

## 2018-04-29 NOTE — Telephone Encounter (Signed)
Reviewing his records, it looks like his lasix was stopped during his November hospital stay.  I don't think it needs to be restarted since he looked actually quite good during office visit exam (no lower extremity edema or obvious ascites on exam).  As for his cough, he should discuss that with his PCP

## 2018-04-30 ENCOUNTER — Ambulatory Visit (HOSPITAL_COMMUNITY): Payer: 59

## 2018-04-30 NOTE — Telephone Encounter (Signed)
[name] the friend and interpreter was advised the pt needs to follow up with PCP for cough. The pt has been advised of the information and verbalized understanding.

## 2018-05-01 ENCOUNTER — Ambulatory Visit (INDEPENDENT_AMBULATORY_CARE_PROVIDER_SITE_OTHER): Payer: 59

## 2018-05-01 ENCOUNTER — Encounter: Payer: Self-pay | Admitting: Osteopathic Medicine

## 2018-05-01 ENCOUNTER — Ambulatory Visit: Payer: 59 | Admitting: Osteopathic Medicine

## 2018-05-01 ENCOUNTER — Other Ambulatory Visit: Payer: Self-pay

## 2018-05-01 VITALS — BP 110/69 | HR 66 | Temp 97.8°F | Resp 16 | Ht 62.25 in | Wt 126.2 lb

## 2018-05-01 DIAGNOSIS — R05 Cough: Secondary | ICD-10-CM

## 2018-05-01 DIAGNOSIS — J40 Bronchitis, not specified as acute or chronic: Secondary | ICD-10-CM

## 2018-05-01 DIAGNOSIS — R059 Cough, unspecified: Secondary | ICD-10-CM

## 2018-05-01 DIAGNOSIS — Z87891 Personal history of nicotine dependence: Secondary | ICD-10-CM | POA: Diagnosis not present

## 2018-05-01 DIAGNOSIS — Z8611 Personal history of tuberculosis: Secondary | ICD-10-CM | POA: Diagnosis not present

## 2018-05-01 MED ORDER — GUAIFENESIN-CODEINE 100-10 MG/5ML PO SYRP: 5.0000 mL | ORAL_SOLUTION | Freq: Four times a day (QID) | ORAL | 0 refills | Status: DC | PRN

## 2018-05-01 MED ORDER — AZITHROMYCIN 250 MG PO TABS: ORAL_TABLET | ORAL | 0 refills | Status: DC

## 2018-05-01 NOTE — Progress Notes (Signed)
HPI: Jose Snow is a 48 y.o. male who  has a past medical history of Cirrhosis ([place]), Diabetes mellitus ([place]), Hepatitis B, Lymphocytic colitis, and TB (tuberculosis).  he presents to [place] at [facility] today, 05/01/18,  for chief complaint of:  Chief Complaint  Patient presents with  . Cough    x 1 month but has worsened over the past 2 week, unable to sleep at night due to cough, per pt white drainage from cough, drainage worse at night.  Pt taking tylenol for cough no other otc meds for cough.  . Medication Refill    carafate    Cough . Context: former smoker, hx TB . Location: chest . Quality: dry w/ occasional white mucus  . Severity: worse over past 2 weeks . Duration: 1 month total . Timing: worse at night . Modifying factors: tylenol for cough minimal relief . Assoc signs/symptoms: no fever/chills   Interpreter assists with history      Past medical history, surgical history, and family history reviewed.  Current medication list and allergy/intolerance information reviewed.   (See remainder of HPI, ROS, Phys Exam below)  Dg Chest 2 View  Result Date: 05/01/2018 CLINICAL DATA:  Cough EXAM: CHEST - 2 VIEW COMPARISON:  July 23, 2017 and July 11, 2017 FINDINGS: There is scarring at the left base, stable. There are small granulomas throughout the lungs bilaterally. There is scarring in the left upper lobe and medial left base, stable. There is no frank edema or consolidation. Heart size and pulmonary vascularity are normal. No evident adenopathy. No bone lesions. IMPRESSION: Evidence of prior granulomatous disease with areas of scarring on the left. No frank edema or consolidation evident. Stable cardiac silhouette. No evident adenopathy. Electronically Signed   By: Lowella Grip III M.D.   On: 05/01/2018 15:01           ASSESSMENT/PLAN:   The primary encounter diagnosis was Cough. Diagnoses of Bronchitis, History of TB (tuberculosis), and  Former smoker were also pertinent to this visit.  Cough - Plan: DG Chest 2 View   Meds ordered this encounter  Medications  . azithromycin (ZITHROMAX) 250 MG tablet    Sig: Day 1: take 2 tablets once. Day 2-5, take 1 tablet once per day.    Dispense:  6 tablet    Refill:  0  . guaiFENesin-codeine (ROBITUSSIN AC) 100-10 MG/5ML syrup    Sig: Take 5 mLs by mouth 4 (four) times daily as needed for cough.    Dispense:  118 mL    Refill:  0    Patient Instructions  Plan:  Will try antibiotics and cough medicine for persistent bronchitis. There does not seem to be a bad pneumonia.   If not better in a week, or if worse, please come see [place]  There is some old scar tissue on your left lung, this is not likely anything dangerous but Dr. Mitchel Honour might want to talk to you about more testing if needed.    Follow-up plan: Return in about 2 weeks (around 05/15/2018) for recheck cough with Dr. Mitchel Honour.                                                 ############################################ ############################################ ############################################ ############################################    Outpatient Encounter Medications as of 05/01/2018  Medication Sig Note  . insulin  glargine (LANTUS) 100 UNIT/ML injection Inject 9 Units into the skin daily.   Marland Kitchen lactulose (CHRONULAC) 10 GM/15ML solution Take 45 mLs (30 g total) by mouth daily.   Marland Kitchen levocetirizine (XYZAL) 5 MG tablet TAKE 1 TABLET BY MOUTH EVERY EVENING (Patient taking differently: Take 5 mg by mouth every evening. )   . nadolol (CORGARD) 20 MG tablet Take 1 tablet (20 mg total) by mouth daily.   Marland Kitchen omeprazole (PRILOSEC) 40 MG capsule Take 1 capsule (40 mg total) by mouth daily before breakfast.   . sitaGLIPtin (JANUVIA) 25 MG tablet Take 1 tablet (25 mg total) by mouth daily.   . sucralfate (CARAFATE) 1 GM/10ML suspension Take 10 mLs (1 g total) by mouth  4 (four) times daily. Take between meals and at bedtime. 05/01/2018: Needs refill  . tenofovir (VIREAD) 300 MG tablet Take 300 mg by mouth daily.   Marland Kitchen azithromycin (ZITHROMAX) 250 MG tablet Day 1: take 2 tablets once. Day 2-5, take 1 tablet once per day.   . bismuth-metronidazole-tetracycline (PYLERA) 140-125-125 MG capsule Take 3 capsules by mouth 4 (four) times daily -  before meals and at bedtime for 14 days.   Marland Kitchen guaiFENesin-codeine (ROBITUSSIN AC) 100-10 MG/5ML syrup Take 5 mLs by mouth 4 (four) times daily as needed for cough.   Marland Kitchen spironolactone (ALDACTONE) 100 MG tablet Take 1 tablet (100 mg total) by mouth daily.    No facility-administered encounter medications on file as of 05/01/2018.    No Known Allergies    Review of Systems:  Constitutional: No chills, No fever  HEENT: No  headache, no vision change  Cardiac: No  chest pain, No  pressure, No palpitations  Respiratory:  No  shortness of breath. +Cough  Gastrointestinal: No  abdominal pain  Neurologic: No  weakness, No  Dizziness   Exam:  BP 110/69 (BP Location: Right Arm, Patient Position: Sitting, Cuff Size: Normal)   Pulse 66   Temp 97.8 F (36.6 C) (Oral)   Resp 16   Ht 5' 2.25" (1.581 m)   Wt 126 lb 3.2 oz (57.2 kg)   SpO2 99%   BMI 22.90 kg/m   Constitutional: VS see above. General Appearance: alert, well-developed, well-nourished, NAD  Eyes: Normal lids and conjunctive, non-icteric sclera  Ears, Nose, Mouth, Throat: MMM, Normal external inspection ears/nares/mouth/lips/gums.  Neck: No masses, trachea midline.   Respiratory: Normal respiratory effort. +wheeze worse on L, no rhonchi, no rales  Cardiovascular: S1/S2 normal, no murmur, no rub/gallop auscultated. RRR.   Musculoskeletal: Gait normal. Symmetric and independent movement of all extremities  Neurological: Normal balance/coordination. No tremor.  Skin: warm, dry, intact.   Psychiatric: Normal judgment/insight. Normal mood and affect.  Oriented x3.   Visit summary with medication list and pertinent instructions was printed for patient to review, advised to alert [place] if any changes needed. All questions at time of visit were answered - patient instructed to contact office with any additional concerns. ER/RTC precautions were reviewed with the patient and understanding verbalized.   Follow-up plan: Return in about 2 weeks (around 05/15/2018) for recheck cough with Dr. Mitchel Honour.   Please note: voice recognition software was used to produce this document, and typos may escape review. Please contact Dr. Sheppard Coil for any needed clarifications.

## 2018-05-01 NOTE — Patient Instructions (Addendum)
Plan:  Will try antibiotics and cough medicine for persistent bronchitis. There does not seem to be a bad pneumonia.   If not better in a week, or if worse, please come see [place]  There is some old scar tissue on your left lung, this is not likely anything dangerous but Dr. Mitchel Honour might want to talk to you about more testing if needed.

## 2018-05-04 ENCOUNTER — Ambulatory Visit (HOSPITAL_COMMUNITY)
Admission: RE | Admit: 2018-05-04 | Discharge: 2018-05-04 | Disposition: A | Discharge status: Home / Self Care | Payer: 59 | Source: Ambulatory Visit | Attending: Gastroenterology | Admitting: Gastroenterology

## 2018-05-04 ENCOUNTER — Ambulatory Visit: Payer: PRIVATE HEALTH INSURANCE | Admitting: Gastroenterology

## 2018-05-04 DIAGNOSIS — K7460 Unspecified cirrhosis of liver: Secondary | ICD-10-CM | POA: Insufficient documentation

## 2018-05-04 DIAGNOSIS — C220 Liver cell carcinoma: Secondary | ICD-10-CM | POA: Insufficient documentation

## 2018-05-04 DIAGNOSIS — R772 Abnormality of alphafetoprotein: Secondary | ICD-10-CM | POA: Diagnosis not present

## 2018-05-04 DIAGNOSIS — K760 Fatty (change of) liver, not elsewhere classified: Secondary | ICD-10-CM | POA: Diagnosis not present

## 2018-05-04 MED ORDER — GADOBUTROL 1 MMOL/ML IV SOLN
7.5000 mL | Freq: Once | INTRAVENOUS | Status: AC | PRN
  Administered 2018-05-04: 6 mL via INTRAVENOUS

## 2018-05-06 ENCOUNTER — Telehealth: Payer: Self-pay | Admitting: Emergency Medicine

## 2018-05-06 DIAGNOSIS — K7469 Other cirrhosis of liver: Secondary | ICD-10-CM | POA: Diagnosis not present

## 2018-05-06 NOTE — Telephone Encounter (Signed)
[facility] sent in a denial for Januvia TAB 25 MG. I have placed in Dr. Barry Brunner box at the [facility].

## 2018-05-08 NOTE — Telephone Encounter (Signed)
Please see note below. 

## 2018-05-08 NOTE — Telephone Encounter (Signed)
Patient's diabetes being handled by his endocrinologist.  Thanks.

## 2018-05-14 ENCOUNTER — Other Ambulatory Visit: Payer: Self-pay

## 2018-05-14 ENCOUNTER — Ambulatory Visit: Payer: 59 | Admitting: Emergency Medicine

## 2018-05-14 ENCOUNTER — Encounter: Payer: Self-pay | Admitting: Emergency Medicine

## 2018-05-14 VITALS — BP 100/60 | HR 56 | Temp 98.1°F | Resp 16 | Wt 129.8 lb

## 2018-05-14 DIAGNOSIS — K7581 Nonalcoholic steatohepatitis (NASH): Secondary | ICD-10-CM | POA: Diagnosis not present

## 2018-05-14 DIAGNOSIS — J40 Bronchitis, not specified as acute or chronic: Secondary | ICD-10-CM | POA: Diagnosis not present

## 2018-05-14 DIAGNOSIS — K7460 Unspecified cirrhosis of liver: Secondary | ICD-10-CM

## 2018-05-14 DIAGNOSIS — E1169 Type 2 diabetes mellitus with other specified complication: Secondary | ICD-10-CM

## 2018-05-14 MED ORDER — LINAGLIPTIN 5 MG PO TABS: 5.0000 mg | ORAL_TABLET | Freq: Every day | ORAL | 1 refills | Status: DC

## 2018-05-14 MED ORDER — SUCRALFATE 1 GM/10ML PO SUSP: 1.0000 g | Freq: Four times a day (QID) | ORAL | 3 refills | Status: DC

## 2018-05-14 MED ORDER — LACTULOSE 10 GM/15ML PO SOLN: 30.0000 g | Freq: Every day | ORAL | 3 refills | Status: DC

## 2018-05-14 NOTE — Patient Instructions (Addendum)
If you have lab work done today you will be contacted with your lab results within the next 2 weeks.  If you have not heard from [place] then please contact us. The fastest way to get your results is to register for My Chart.   IF you received an x-ray today, you will receive an invoice from [facility]. Please contact [facility] at [phone] with questions or concerns regarding your invoice.   IF you received labwork today, you will receive an invoice from [facility]. Please contact [facility] at [phone] with questions or concerns regarding your invoice.   Our billing staff will not be able to assist you with questions regarding bills from these companies.  You will be contacted with the lab results as soon as they are available. The fastest way to get your results is to activate your My Chart account. Instructions are located on the last page of this paperwork. If you have not heard from [place] regarding the results in 2 weeks, please contact this office.     Cirrhosis  Cirrhosis is long-term (chronic) liver injury. The liver is the body's largest internal organ, and it performs many functions. It converts food into energy, removes toxic material from the blood, makes important proteins, and absorbs necessary vitamins from food. In cirrhosis, healthy liver cells are replaced by scar tissue. This prevents blood from flowing through the liver, making it difficult for the liver to function. Scarring of the liver cannot be reversed, but treatment can prevent it from getting worse. What are the causes? Common causes of this condition are hepatitis C and long-term alcohol abuse. Other causes include:  Nonalcoholic fatty liver disease. This happens when fat is deposited in the liver by causes other than alcohol.  Hepatitis B infection.  Autoimmune hepatitis. In this condition, the body's defense system (immune system) mistakenly attacks the liver cells, causing irritation and  swelling (inflammation).  Diseases that cause blockage of ducts inside the liver.  Inherited liver diseases, such as hemochromatosis. This is one of the most common inherited liver diseases. In this disease, deposits of iron collect in the liver and other organs.  Reactions to certain long-term medicines, such as amiodarone, a heart medicine.  Parasitic infections. These include schistosomiasis, which is caused by a flatworm.  Long-term contact to certain toxins. These toxins include certain organic solvents, such as toluene and chloroform. What increases the risk? You are more likely to develop this condition if:  You have certain types of viral hepatitis.  You abuse alcohol, especially if you are male.  You are overweight.  You share needles.  You have unprotected sex with someone who has viral hepatitis. What are the signs or symptoms? You may not have any signs and symptoms at first. Symptoms may not develop until the damage to your liver starts to get worse. Early symptoms may include:  Weakness and tiredness (fatigue).  Changes in sleep patterns or having trouble sleeping.  Itchiness.  Tenderness in the right-upper part of your abdomen.  Weight loss and muscle loss.  Nausea.  Loss of appetite.  Appearance of tiny blood vessels under the skin. Later symptoms may include:  Fatigue or weakness that is getting worse.  Yellow skin and eyes (jaundice).  Buildup of fluid in the abdomen (ascites). You may notice that your clothes are tight around your waist.  Weight gain.  Swelling of the feet and ankles (edema).  Trouble breathing.  Easy bruising and bleeding.  Vomiting blood.  Black  or bloody stool.  Mental confusion. How is this diagnosed? Your health care provider may suspect cirrhosis based on your symptoms and medical history, especially if you have other medical conditions or a history of alcohol abuse. Your health care provider will do a  physical exam to feel your liver and to check for signs of cirrhosis. He or she may perform other tests, including:  Blood tests to check: ? For hepatitis B or C. ? Kidney function. ? Liver function.  Imaging tests such as: ? MRI or CT scan to look for changes seen in advanced cirrhosis. ? Ultrasound to see if normal liver tissue is being replaced by scar tissue.  A procedure in which a long needle is used to take a sample of liver tissue to be checked in a lab (biopsy). Liver biopsy can confirm the diagnosis of cirrhosis. How is this treated? Treatment for this condition depends on how damaged your liver is and what caused the damage. It may include treating the symptoms of cirrhosis, or treating the underlying causes in order to slow the damage. Treatment may include:  Making lifestyle changes, such as: ? Eating a healthy diet. You may need to work with your health care provider or a diet and nutrition specialist (dietitian) to develop an eating plan. ? Restricting salt intake. ? Maintaining a healthy weight. ? Not abusing drugs or alcohol.  Taking medicines to: ? Treat liver infections or other infections. ? Control itching. ? Reduce fluid buildup. ? Reduce certain blood toxins. ? Reduce risk of bleeding from enlarged blood vessels in the stomach or esophagus (varices).  Liver transplant. In this procedure, a liver from a donor is used to replace your diseased liver. This is done if cirrhosis has caused liver failure. Other treatments and procedures may be done depending on the problems that you get from cirrhosis. Common problems include liver-related kidney failure (hepatorenal syndrome). Follow these instructions at home:   Take medicines only as told by your health care provider. Do not use medicines that are toxic to your liver. Ask your health care provider before taking any new medicines, including over-the-counter medicines.  Rest as needed.  Eat a well-balanced diet.  Ask your health care provider or dietitian for more information.  Limit your salt or water intake, if your health care provider asks you to do this.  Do not drink alcohol. This is especially important if you are taking acetaminophen.  Keep all follow-up visits as told by your health care provider. This is important. Contact a health care provider if you:  Have fatigue or weakness that is getting worse.  Develop swelling of the hands, feet, legs, or face.  Have a fever.  Develop loss of appetite.  Have nausea or vomiting.  Develop jaundice.  Develop easy bruising or bleeding. Get help right away if you:  Vomit bright red blood or a material that looks like coffee grounds.  Have blood in your stools.  Notice that your stools appear black and tarry.  Become confused.  Have chest pain or trouble breathing. Summary  Cirrhosis is chronic liver injury. Liver damage cannot be reversed. Common causes are hepatitis C and long-term alcohol abuse.  Tests used to diagnose cirrhosis include blood tests, imaging tests, and liver biopsy.  Treatment for this condition involves treating the underlying cause. Avoid alcohol, drugs, salt, and medicines that may damage your liver.  Contact your health care provider if you develop ascites, edema, jaundice, fever, nausea or vomiting, easy bruising or  bleeding, or worsening fatigue. This information is not intended to replace advice given to you by your health care provider. Make sure you discuss any questions you have with your health care provider. Document Released: 03/25/2005 Document Revised: 02/12/2017 Document Reviewed: 02/12/2017 Elsevier Interactive Patient Education  2019 [place].  Diabetes Mellitus and Nutrition, Adult When you have diabetes (diabetes mellitus), it is very important to have healthy eating habits because your blood sugar (glucose) levels are greatly affected by what you eat and drink. Eating healthy foods in the  appropriate amounts, at about the same times every day, can help you:  Control your blood glucose.  Lower your risk of heart disease.  Improve your blood pressure.  Reach or maintain a healthy weight. Every person with diabetes is different, and each person has different needs for a meal plan. Your health care provider may recommend that you work with a diet and nutrition specialist (dietitian) to make a meal plan that is best for you. Your meal plan may vary depending on factors such as:  The calories you need.  The medicines you take.  Your weight.  Your blood glucose, blood pressure, and cholesterol levels.  Your activity level.  Other health conditions you have, such as heart or kidney disease. How do carbohydrates affect me? Carbohydrates, also called carbs, affect your blood glucose level more than any other type of food. Eating carbs naturally raises the amount of glucose in your blood. Carb counting is a method for keeping track of how many carbs you eat. Counting carbs is important to keep your blood glucose at a healthy level, especially if you use insulin or take certain oral diabetes medicines. It is important to know how many carbs you can safely have in each meal. This is different for every person. Your dietitian can help you calculate how many carbs you should have at each meal and for each snack. Foods that contain carbs include:  Bread, cereal, rice, pasta, and crackers.  Potatoes and corn.  Peas, beans, and lentils.  Milk and yogurt.  Fruit and juice.  Desserts, such as cakes, cookies, ice cream, and candy. How does alcohol affect me? Alcohol can cause a sudden decrease in blood glucose (hypoglycemia), especially if you use insulin or take certain oral diabetes medicines. Hypoglycemia can be a life-threatening condition. Symptoms of hypoglycemia (sleepiness, dizziness, and confusion) are similar to symptoms of having too much alcohol. If your health care  provider says that alcohol is safe for you, follow these guidelines:  Limit alcohol intake to no more than 1 drink per day for nonpregnant women and 2 drinks per day for men. One drink equals 12 oz of beer, 5 oz of wine, or 1 oz of hard liquor.  Do not drink on an empty stomach.  Keep yourself hydrated with water, diet soda, or unsweetened iced tea.  Keep in mind that regular soda, juice, and other mixers may contain a lot of sugar and must be counted as carbs. What are tips for following this plan?  Reading food labels  Start by checking the serving size on the "Nutrition Facts" label of packaged foods and drinks. The amount of calories, carbs, fats, and other nutrients listed on the label is based on one serving of the item. Many items contain more than one serving per package.  Check the total grams (g) of carbs in one serving. You can calculate the number of servings of carbs in one serving by dividing the total carbs by  15. For example, if a food has 30 g of total carbs, it would be equal to 2 servings of carbs.  Check the number of grams (g) of saturated and trans fats in one serving. Choose foods that have low or no amount of these fats.  Check the number of milligrams (mg) of salt (sodium) in one serving. Most people should limit total sodium intake to less than 2,300 mg per day.  Always check the nutrition information of foods labeled as "low-fat" or "nonfat". These foods may be higher in added sugar or refined carbs and should be avoided.  Talk to your dietitian to identify your daily goals for nutrients listed on the label. Shopping  Avoid buying canned, premade, or processed foods. These foods tend to be high in fat, sodium, and added sugar.  Shop around the outside edge of the grocery store. This includes fresh fruits and vegetables, bulk grains, fresh meats, and fresh dairy. Cooking  Use low-heat cooking methods, such as baking, instead of high-heat cooking methods like  deep frying.  Cook using healthy oils, such as olive, canola, or sunflower oil.  Avoid cooking with butter, cream, or high-fat meats. Meal planning  Eat meals and snacks regularly, preferably at the same times every day. Avoid going long periods of time without eating.  Eat foods high in fiber, such as fresh fruits, vegetables, beans, and whole grains. Talk to your dietitian about how many servings of carbs you can eat at each meal.  Eat 4-6 ounces (oz) of lean protein each day, such as lean meat, chicken, fish, eggs, or tofu. One oz of lean protein is equal to: ? 1 oz of meat, chicken, or fish. ? 1 egg. ?  cup of tofu.  Eat some foods each day that contain healthy fats, such as avocado, nuts, seeds, and fish. Lifestyle  Check your blood glucose regularly.  Exercise regularly as told by your health care provider. This may include: ? 150 minutes of moderate-intensity or vigorous-intensity exercise each week. This could be brisk walking, biking, or water aerobics. ? Stretching and doing strength exercises, such as yoga or weightlifting, at least 2 times a week.  Take medicines as told by your health care provider.  Do not use any products that contain nicotine or tobacco, such as cigarettes and e-cigarettes. If you need help quitting, ask your health care provider.  Work with a Social worker or diabetes educator to identify strategies to manage stress and any emotional and social challenges. Questions to ask a health care provider  Do I need to meet with a diabetes educator?  Do I need to meet with a dietitian?  What number can I call if I have questions?  When are the best times to check my blood glucose? Where to find more information:  American Diabetes Association: diabetes.org  Academy of Nutrition and Dietetics: www.eatright.[employer] of Diabetes and Digestive and Kidney Diseases (NIH): [employer] Summary  A healthy meal plan will help you control  your blood glucose and maintain a healthy lifestyle.  Working with a diet and nutrition specialist (dietitian) can help you make a meal plan that is best for you.  Keep in mind that carbohydrates (carbs) and alcohol have immediate effects on your blood glucose levels. It is important to count carbs and to use alcohol carefully. This information is not intended to replace advice given to you by your health care provider. Make sure you discuss any questions you have with your health care provider.  Document Released: 12/20/2004 Document Revised: 10/23/2016 Document Reviewed: 04/29/2016 Elsevier Interactive Patient Education  2019 [employer].

## 2018-05-14 NOTE — Progress Notes (Signed)
Jose Snow 48 y.o.   Chief Complaint  Patient presents with  . Cough    follow up - a little better  . Medication Refill    HISTORY OF PRESENT ILLNESS: This is a 48 y.o. male with history of diabetes and liver cirrhosis seen here 2 weeks ago for lower respiratory infection, here for follow-up.  Feeling better.  Was treated with azithromycin.  Has no complaints today.  Needs medication refills.  Interpreter helping with history. Cough  Pertinent negatives include no chest pain, chills, fever, headaches, rash or shortness of breath.  Medication Refill  Associated symptoms include coughing. Pertinent negatives include no abdominal pain, chest pain, chills, fever, headaches, nausea, rash or vomiting.     Prior to Admission medications   Medication Sig Start Date End Date Taking? Authorizing Provider  insulin glargine (LANTUS) 100 UNIT/ML injection Inject 9 Units into the skin daily.   Yes [provider]  lactulose (CHRONULAC) 10 GM/15ML solution Take 45 mLs (30 g total) by mouth daily. 12/23/17  Yes Esterwood, Amy S, PA-C  levocetirizine (XYZAL) 5 MG tablet TAKE 1 TABLET BY MOUTH EVERY EVENING Patient taking differently: Take 5 mg by mouth every evening.  02/09/18  Yes Rutherford Guys, MD  nadolol (CORGARD) 20 MG tablet Take 1 tablet (20 mg total) by mouth daily. 04/17/18  Yes Milus Banister, MD  omeprazole (PRILOSEC) 40 MG capsule Take 1 capsule (40 mg total) by mouth daily before breakfast. 04/17/18  Yes Milus Banister, MD  sitaGLIPtin (JANUVIA) 25 MG tablet Take 1 tablet (25 mg total) by mouth daily. 12/23/17  Yes Mone Commisso, Ines Bloomer, MD  sucralfate (CARAFATE) 1 GM/10ML suspension Take 10 mLs (1 g total) by mouth 4 (four) times daily. Take between meals and at bedtime. 12/23/17  Yes Esterwood, Amy S, PA-C  tenofovir (VIREAD) 300 MG tablet Take 300 mg by mouth daily.   Yes [provider]  azithromycin (ZITHROMAX) 250 MG tablet Day 1: take 2 tablets once. Day 2-5,  take 1 tablet once per day. Patient not taking: Reported on 05/14/2018 05/01/18   Emeterio Reeve, DO  bismuth-metronidazole-tetracycline [facility]) [phone] MG capsule Take 3 capsules by mouth 4 (four) times daily -  before meals and at bedtime for 14 days. 03/11/18 03/25/18  Milus Banister, MD  guaiFENesin-codeine [facility]) 100-10 MG/5ML syrup Take 5 mLs by mouth 4 (four) times daily as needed for cough. Patient not taking: Reported on 05/14/2018 05/01/18   Emeterio Reeve, DO  spironolactone (ALDACTONE) 100 MG tablet Take 1 tablet (100 mg total) by mouth daily. 11/06/17 03/02/18  Milus Banister, MD    No Known Allergies  Patient Active Problem List   Diagnosis Date Noted  . Malnutrition of moderate degree 03/03/2018  . Epigastric pain 03/02/2018  . Pancytopenia ([place]) 12/03/2017  . Hyperglycemia 12/03/2017  . Cramps, extremity 12/03/2017  . Abnormal echocardiogram 12/03/2017  . New onset type 2 diabetes mellitus ([place]) 12/03/2017  . History of hepatitis B 12/03/2017  . Diarrhea   . 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 Heart murmur 08/01/2017  . Hepatitis B 02/17/2015  . Elevated bilirubin   . Liver cirrhosis secondary to NASH (nonalcoholic steatohepatitis) ([place]) 11/25/2014  . Thrombocytopenia ([place]) 11/25/2014  . History of TB (tuberculosis) 07/16/2010    Past Medical History:  Diagnosis Date  . Cirrhosis ([place])   . Diabetes mellitus ([place])   . Hepatitis B   . Lymphocytic colitis   . 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     Past Surgical History:  Procedure Laterality Date  . BIOPSY  10/23/2017   Procedure: BIOPSY;  Surgeon: Milus Banister, MD;  Location: [facility];  Service: Endoscopy;;  . BIOPSY  03/03/2018   Procedure: BIOPSY;  Surgeon: Irving Copas., MD;  Location: [place];  Service: Gastroenterology;;  . COLONOSCOPY WITH PROPOFOL N/A 10/23/2017   Procedure: COLONOSCOPY WITH PROPOFOL;  Surgeon: Milus Banister, MD;   Location: [facility];  Service: Endoscopy;  Laterality: N/A;  . COMPLEX WOUND CLOSURE Right 12/15/2015   Procedure: COMPLEX WOUND CLOSURE;  Surgeon: Leanora Cover, MD;  Location: [place];  Service: Orthopedics;  Laterality: Right;  . ESOPHAGOGASTRODUODENOSCOPY (EGD) WITH PROPOFOL N/A 10/23/2017   Procedure: ESOPHAGOGASTRODUODENOSCOPY (EGD) WITH PROPOFOL;  Surgeon: Milus Banister, MD;  Location: [facility];  Service: Endoscopy;  Laterality: N/A;  . ESOPHAGOGASTRODUODENOSCOPY (EGD) WITH PROPOFOL N/A 03/03/2018   Procedure: ESOPHAGOGASTRODUODENOSCOPY (EGD) WITH PROPOFOL;  Surgeon: Rush Landmark Telford Nab., MD;  Location: [place];  Service: Gastroenterology;  Laterality: N/A;  . I&D EXTREMITY Right 12/15/2015   Procedure: IRRIGATION AND DEBRIDEMENT AND REVISION AMPUTATION RIGHT RING FINGER;  Surgeon: Leanora Cover, MD;  Location: [place];  Service: Orthopedics;  Laterality: Right;    Social History   Socioeconomic History  . Marital status: Married    Spouse name: Not on file  . Number of children: 4  . Years of education: Not on file  . Highest education level: Not on file  Occupational History  . 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 Financial resource strain: Not on file  . Food insecurity:    Worry: Not on file    Inability: Not on file  . Transportation needs:    Medical: Not on file    Non-medical: Not on file  Tobacco Use  . Smoking status: Former Smoker    Packs/day: 0.50    Years: 8.00    Pack years: 4.00    Types: Cigarettes    Last attempt to quit: 04/08/1990    Years since quitting: 28.1  . Smokeless tobacco: Never Used  Substance and Sexual Activity  . Alcohol use: No  . Drug use: No  . Sexual activity: Yes  Lifestyle  . Physical activity:    Days per week: Not on file    Minutes per session: Not on file  . Stress: Not on file  Relationships  . Social connections:    Talks on phone: Not on file    Gets together: Not on file     Attends religious service: Not on file    Active member of club or organization: Not on file    Attends meetings of clubs or organizations: Not on file    Relationship status: Not on file  . Intimate partner violence:    Fear of current or ex partner: Not on file    Emotionally abused: Not on file    Physically abused: Not on file    Forced sexual activity: Not on file  Other Topics Concern  . 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  Lives with wife and four children.     Family History  Problem Relation Age of Onset  . Colon cancer Neg Hx   . Esophageal cancer Neg Hx   . Pancreatic cancer Neg Hx   . Stomach cancer Neg Hx   . Liver disease Neg Hx  Review of Systems  Constitutional: Negative.  Negative for chills and fever.  HENT: Negative.   Respiratory: Positive for cough. Negative for shortness of breath.   Cardiovascular: Negative for chest pain and palpitations.  Gastrointestinal: Negative for abdominal pain, nausea and vomiting.  Skin: Negative.  Negative for rash.  Neurological: Negative for dizziness and headaches.  All other systems reviewed and are negative.   Vitals:   05/14/18 1631  BP: 100/60  Pulse: (!) 56  Resp: 16  Temp: 98.1 F (36.7 C)  SpO2: 99%     Physical Exam Vitals signs reviewed.  Constitutional:      Appearance: Normal appearance.  HENT:     Head: Normocephalic and atraumatic.     Mouth/Throat:     Mouth: Mucous membranes are moist.     Pharynx: Oropharynx is clear.  Eyes:     Extraocular Movements: Extraocular movements intact.     Conjunctiva/sclera: Conjunctivae normal.     Pupils: Pupils are equal, round, and reactive to light.  Neck:     Musculoskeletal: Normal range of motion.  Cardiovascular:     Rate and Rhythm: Normal rate and regular rhythm.     Heart sounds: Normal heart sounds.  Pulmonary:     Effort: Pulmonary effort is normal.     Breath sounds: Normal breath sounds.  Abdominal:      Palpations: Abdomen is soft.     Tenderness: There is no abdominal tenderness.  Musculoskeletal: Normal range of motion.  Skin:    General: Skin is warm and dry.     Capillary Refill: Capillary refill takes less than 2 seconds.  Neurological:     General: No focal deficit present.     Mental Status: He is alert and oriented to person, place, and time.    A total of 25 minutes was spent in the room with the patient, greater than 50% of which was in counseling/coordination of care regarding chronic medical problems, treatment, medication side effects, and need for follow-up in 3 months.   ASSESSMENT & PLAN: Y was seen today for cough and medication refill.  Diagnoses and all orders for this visit:  Type 2 diabetes mellitus with other specified complication, without long-term current use of insulin (HCC) -     linagliptin (TRADJENTA) 5 MG TABS tablet; Take 1 tablet (5 mg total) by mouth daily.  Liver cirrhosis secondary to NASH (nonalcoholic steatohepatitis) (HCC) -     lactulose (CHRONULAC) 10 GM/15ML solution; Take 45 mLs (30 g total) by mouth daily. -     sucralfate (CARAFATE) 1 GM/10ML suspension; Take 10 mLs (1 g total) by mouth 4 (four) times daily. Take between meals and at bedtime.  Bronchitis Comments: improved    Patient Instructions       If you have lab work done today you will be contacted with your lab results within the next 2 weeks.  If you have not heard from [place] then please contact us. The fastest way to get your results is to register for My Chart.   IF you received an x-ray today, you will receive an invoice from [facility]. Please contact [facility] at [phone] with questions or concerns regarding your invoice.   IF you received labwork today, you will receive an invoice from [facility]. Please contact [facility] at [phone] with questions or concerns regarding your invoice.   Our billing staff will not be able to assist you with  questions regarding bills from these companies.  You will be  contacted with the lab results as soon as they are available. The fastest way to get your results is to activate your My Chart account. Instructions are located on the last page of this paperwork. If you have not heard from [place] regarding the results in 2 weeks, please contact this office.     Cirrhosis  Cirrhosis is long-term (chronic) liver injury. The liver is the body's largest internal organ, and it performs many functions. It converts food into energy, removes toxic material from the blood, makes important proteins, and absorbs necessary vitamins from food. In cirrhosis, healthy liver cells are replaced by scar tissue. This prevents blood from flowing through the liver, making it difficult for the liver to function. Scarring of the liver cannot be reversed, but treatment can prevent it from getting worse. What are the causes? Common causes of this condition are hepatitis C and long-term alcohol abuse. Other causes include:  Nonalcoholic fatty liver disease. This happens when fat is deposited in the liver by causes other than alcohol.  Hepatitis B infection.  Autoimmune hepatitis. In this condition, the body's defense system (immune system) mistakenly attacks the liver cells, causing irritation and swelling (inflammation).  Diseases that cause blockage of ducts inside the liver.  Inherited liver diseases, such as hemochromatosis. This is one of the most common inherited liver diseases. In this disease, deposits of iron collect in the liver and other organs.  Reactions to certain long-term medicines, such as amiodarone, a heart medicine.  Parasitic infections. These include schistosomiasis, which is caused by a flatworm.  Long-term contact to certain toxins. These toxins include certain organic solvents, such as toluene and chloroform. What increases the risk? You are more likely to develop this condition if:  You have  certain types of viral hepatitis.  You abuse alcohol, especially if you are male.  You are overweight.  You share needles.  You have unprotected sex with someone who has viral hepatitis. What are the signs or symptoms? You may not have any signs and symptoms at first. Symptoms may not develop until the damage to your liver starts to get worse. Early symptoms may include:  Weakness and tiredness (fatigue).  Changes in sleep patterns or having trouble sleeping.  Itchiness.  Tenderness in the right-upper part of your abdomen.  Weight loss and muscle loss.  Nausea.  Loss of appetite.  Appearance of tiny blood vessels under the skin. Later symptoms may include:  Fatigue or weakness that is getting worse.  Yellow skin and eyes (jaundice).  Buildup of fluid in the abdomen (ascites). You may notice that your clothes are tight around your waist.  Weight gain.  Swelling of the feet and ankles (edema).  Trouble breathing.  Easy bruising and bleeding.  Vomiting blood.  Black or bloody stool.  Mental confusion. How is this diagnosed? Your health care provider may suspect cirrhosis based on your symptoms and medical history, especially if you have other medical conditions or a history of alcohol abuse. Your health care provider will do a physical exam to feel your liver and to check for signs of cirrhosis. He or she may perform other tests, including:  Blood tests to check: ? For hepatitis B or C. ? Kidney function. ? Liver function.  Imaging tests such as: ? MRI or CT scan to look for changes seen in advanced cirrhosis. ? Ultrasound to see if normal liver tissue is being replaced by scar tissue.  A procedure in which a long needle is used to take  a sample of liver tissue to be checked in a lab (biopsy). Liver biopsy can confirm the diagnosis of cirrhosis. How is this treated? Treatment for this condition depends on how damaged your liver is and what caused the  damage. It may include treating the symptoms of cirrhosis, or treating the underlying causes in order to slow the damage. Treatment may include:  Making lifestyle changes, such as: ? Eating a healthy diet. You may need to work with your health care provider or a diet and nutrition specialist (dietitian) to develop an eating plan. ? Restricting salt intake. ? Maintaining a healthy weight. ? Not abusing drugs or alcohol.  Taking medicines to: ? Treat liver infections or other infections. ? Control itching. ? Reduce fluid buildup. ? Reduce certain blood toxins. ? Reduce risk of bleeding from enlarged blood vessels in the stomach or esophagus (varices).  Liver transplant. In this procedure, a liver from a donor is used to replace your diseased liver. This is done if cirrhosis has caused liver failure. Other treatments and procedures may be done depending on the problems that you get from cirrhosis. Common problems include liver-related kidney failure (hepatorenal syndrome). Follow these instructions at home:   Take medicines only as told by your health care provider. Do not use medicines that are toxic to your liver. Ask your health care provider before taking any new medicines, including over-the-counter medicines.  Rest as needed.  Eat a well-balanced diet. Ask your health care provider or dietitian for more information.  Limit your salt or water intake, if your health care provider asks you to do this.  Do not drink alcohol. This is especially important if you are taking acetaminophen.  Keep all follow-up visits as told by your health care provider. This is important. Contact a health care provider if you:  Have fatigue or weakness that is getting worse.  Develop swelling of the hands, feet, legs, or face.  Have a fever.  Develop loss of appetite.  Have nausea or vomiting.  Develop jaundice.  Develop easy bruising or bleeding. Get help right away if you:  Vomit bright  red blood or a material that looks like coffee grounds.  Have blood in your stools.  Notice that your stools appear black and tarry.  Become confused.  Have chest pain or trouble breathing. Summary  Cirrhosis is chronic liver injury. Liver damage cannot be reversed. Common causes are hepatitis C and long-term alcohol abuse.  Tests used to diagnose cirrhosis include blood tests, imaging tests, and liver biopsy.  Treatment for this condition involves treating the underlying cause. Avoid alcohol, drugs, salt, and medicines that may damage your liver.  Contact your health care provider if you develop ascites, edema, jaundice, fever, nausea or vomiting, easy bruising or bleeding, or worsening fatigue. This information is not intended to replace advice given to you by your health care provider. Make sure you discuss any questions you have with your health care provider. Document Released: 03/25/2005 Document Revised: 02/12/2017 Document Reviewed: 02/12/2017 Elsevier Interactive Patient Education  2019 [place].  Diabetes Mellitus and Nutrition, Adult When you have diabetes (diabetes mellitus), it is very important to have healthy eating habits because your blood sugar (glucose) levels are greatly affected by what you eat and drink. Eating healthy foods in the appropriate amounts, at about the same times every day, can help you:  Control your blood glucose.  Lower your risk of heart disease.  Improve your blood pressure.  Reach or maintain a healthy  weight. Every person with diabetes is different, and each person has different needs for a meal plan. Your health care provider may recommend that you work with a diet and nutrition specialist (dietitian) to make a meal plan that is best for you. Your meal plan may vary depending on factors such as:  The calories you need.  The medicines you take.  Your weight.  Your blood glucose, blood pressure, and cholesterol levels.  Your  activity level.  Other health conditions you have, such as heart or kidney disease. How do carbohydrates affect me? Carbohydrates, also called carbs, affect your blood glucose level more than any other type of food. Eating carbs naturally raises the amount of glucose in your blood. Carb counting is a method for keeping track of how many carbs you eat. Counting carbs is important to keep your blood glucose at a healthy level, especially if you use insulin or take certain oral diabetes medicines. It is important to know how many carbs you can safely have in each meal. This is different for every person. Your dietitian can help you calculate how many carbs you should have at each meal and for each snack. Foods that contain carbs include:  Bread, cereal, rice, pasta, and crackers.  Potatoes and corn.  Peas, beans, and lentils.  Milk and yogurt.  Fruit and juice.  Desserts, such as cakes, cookies, ice cream, and candy. How does alcohol affect me? Alcohol can cause a sudden decrease in blood glucose (hypoglycemia), especially if you use insulin or take certain oral diabetes medicines. Hypoglycemia can be a life-threatening condition. Symptoms of hypoglycemia (sleepiness, dizziness, and confusion) are similar to symptoms of having too much alcohol. If your health care provider says that alcohol is safe for you, follow these guidelines:  Limit alcohol intake to no more than 1 drink per day for nonpregnant women and 2 drinks per day for men. One drink equals 12 oz of beer, 5 oz of wine, or 1 oz of hard liquor.  Do not drink on an empty stomach.  Keep yourself hydrated with water, diet soda, or unsweetened iced tea.  Keep in mind that regular soda, juice, and other mixers may contain a lot of sugar and must be counted as carbs. What are tips for following this plan?  Reading food labels  Start by checking the serving size on the "Nutrition Facts" label of packaged foods and drinks. The  amount of calories, carbs, fats, and other nutrients listed on the label is based on one serving of the item. Many items contain more than one serving per package.  Check the total grams (g) of carbs in one serving. You can calculate the number of servings of carbs in one serving by dividing the total carbs by 15. For example, if a food has 30 g of total carbs, it would be equal to 2 servings of carbs.  Check the number of grams (g) of saturated and trans fats in one serving. Choose foods that have low or no amount of these fats.  Check the number of milligrams (mg) of salt (sodium) in one serving. Most people should limit total sodium intake to less than 2,300 mg per day.  Always check the nutrition information of foods labeled as "low-fat" or "nonfat". These foods may be higher in added sugar or refined carbs and should be avoided.  Talk to your dietitian to identify your daily goals for nutrients listed on the label. Shopping  Avoid buying canned, premade, or processed  foods. These foods tend to be high in fat, sodium, and added sugar.  Shop around the outside edge of the grocery store. This includes fresh fruits and vegetables, bulk grains, fresh meats, and fresh dairy. Cooking  Use low-heat cooking methods, such as baking, instead of high-heat cooking methods like deep frying.  Cook using healthy oils, such as olive, canola, or sunflower oil.  Avoid cooking with butter, cream, or high-fat meats. Meal planning  Eat meals and snacks regularly, preferably at the same times every day. Avoid going long periods of time without eating.  Eat foods high in fiber, such as fresh fruits, vegetables, beans, and whole grains. Talk to your dietitian about how many servings of carbs you can eat at each meal.  Eat 4-6 ounces (oz) of lean protein each day, such as lean meat, chicken, fish, eggs, or tofu. One oz of lean protein is equal to: ? 1 oz of meat, chicken, or fish. ? 1 egg. ?  cup of  tofu.  Eat some foods each day that contain healthy fats, such as avocado, nuts, seeds, and fish. Lifestyle  Check your blood glucose regularly.  Exercise regularly as told by your health care provider. This may include: ? 150 minutes of moderate-intensity or vigorous-intensity exercise each week. This could be brisk walking, biking, or water aerobics. ? Stretching and doing strength exercises, such as yoga or weightlifting, at least 2 times a week.  Take medicines as told by your health care provider.  Do not use any products that contain nicotine or tobacco, such as cigarettes and e-cigarettes. If you need help quitting, ask your health care provider.  Work with a Social worker or diabetes educator to identify strategies to manage stress and any emotional and social challenges. Questions to ask a health care provider  Do I need to meet with a diabetes educator?  Do I need to meet with a dietitian?  What number can I call if I have questions?  When are the best times to check my blood glucose? Where to find more information:  American Diabetes Association: diabetes.org  Academy of Nutrition and Dietetics: www.eatright.[employer] of Diabetes and Digestive and Kidney Diseases (NIH): [employer] Summary  A healthy meal plan will help you control your blood glucose and maintain a healthy lifestyle.  Working with a diet and nutrition specialist (dietitian) can help you make a meal plan that is best for you.  Keep in mind that carbohydrates (carbs) and alcohol have immediate effects on your blood glucose levels. It is important to count carbs and to use alcohol carefully. This information is not intended to replace advice given to you by your health care provider. Make sure you discuss any questions you have with your health care provider. Document Released: 12/20/2004 Document Revised: 10/23/2016 Document Reviewed: 04/29/2016 Elsevier Interactive Patient Education   2019 Elsevier Inc.      Agustina Caroli, MD [facility]

## 2018-05-19 ENCOUNTER — Ambulatory Visit: Payer: No Typology Code available for payment source | Admitting: Emergency Medicine

## 2018-06-15 DIAGNOSIS — I272 Pulmonary hypertension, unspecified: Secondary | ICD-10-CM | POA: Diagnosis not present

## 2018-08-05 ENCOUNTER — Other Ambulatory Visit: Payer: Self-pay | Admitting: *Deleted

## 2018-08-05 DIAGNOSIS — E1169 Type 2 diabetes mellitus with other specified complication: Secondary | ICD-10-CM

## 2018-08-10 ENCOUNTER — Ambulatory Visit: Payer: 59 | Admitting: Emergency Medicine

## 2018-08-13 ENCOUNTER — Other Ambulatory Visit: Payer: Self-pay

## 2018-08-13 ENCOUNTER — Telehealth: Payer: Self-pay | Admitting: *Deleted

## 2018-08-13 ENCOUNTER — Encounter: Payer: Self-pay | Admitting: Gastroenterology

## 2018-08-13 ENCOUNTER — Telehealth: Payer: 59 | Admitting: Emergency Medicine

## 2018-08-13 NOTE — Telephone Encounter (Signed)
Called patient's daughter Abelardo Diesel) at [phone], per HIPPA form to triage father for appointment. Left message in voice mail to call back.

## 2018-08-13 NOTE — Telephone Encounter (Signed)
Called patient mobile number to triage for appointment. Left voice mail to call back and 2 messages were left in daughter's mobile voice to call the office. The Interpreter was here to assistant.

## 2018-08-14 ENCOUNTER — Telehealth: Payer: Self-pay | Admitting: Gastroenterology

## 2018-08-14 NOTE — Telephone Encounter (Signed)
The pt was advised that he does not need to be taking furosemide per the phone note dated 1/21 The pt has been advised of the information and verbalized understanding.

## 2018-08-20 ENCOUNTER — Other Ambulatory Visit: Payer: Self-pay

## 2018-08-20 ENCOUNTER — Encounter: Payer: Self-pay | Admitting: Emergency Medicine

## 2018-08-20 ENCOUNTER — Ambulatory Visit: Payer: 59 | Admitting: Emergency Medicine

## 2018-08-20 VITALS — BP 101/66 | HR 65 | Temp 98.6°F | Resp 16 | Ht 63.25 in | Wt 122.6 lb

## 2018-08-20 DIAGNOSIS — E1169 Type 2 diabetes mellitus with other specified complication: Secondary | ICD-10-CM | POA: Diagnosis not present

## 2018-08-20 DIAGNOSIS — K7581 Nonalcoholic steatohepatitis (NASH): Secondary | ICD-10-CM

## 2018-08-20 DIAGNOSIS — K7460 Unspecified cirrhosis of liver: Secondary | ICD-10-CM

## 2018-08-20 DIAGNOSIS — R1013 Epigastric pain: Secondary | ICD-10-CM | POA: Diagnosis not present

## 2018-08-20 DIAGNOSIS — Z23 Encounter for immunization: Secondary | ICD-10-CM

## 2018-08-20 DIAGNOSIS — R110 Nausea: Secondary | ICD-10-CM

## 2018-08-20 LAB — POCT CBC
Granulocyte percent: 52.1 %G (ref 37–80)
HCT, POC: 34.5 % (ref 29–41)
Hemoglobin: 11.5 g/dL (ref 11–14.6)
Lymph, poc: 1 (ref 0.6–3.4)
MCH, POC: 32.6 pg — AB (ref 27–31.2)
MCHC: 33.4 g/dL (ref 31.8–35.4)
MCV: 97.6 fL (ref 76–111)
MID (cbc): 0.3 (ref 0–0.9)
MPV: 9.8 fL (ref 0–99.8)
POC Granulocyte: 1.4 — AB (ref 2–6.9)
POC LYMPH PERCENT: 36.3 %L (ref 10–50)
POC MID %: 11.6 %M (ref 0–12)
Platelet Count, POC: 24 10*3/uL — AB (ref 142–424)
RBC: 3.54 M/uL — AB (ref 4.69–6.13)
RDW, POC: 15.8 %
WBC: 2.7 10*3/uL — AB (ref 4.6–10.2)

## 2018-08-20 LAB — POCT GLYCOSYLATED HEMOGLOBIN (HGB A1C): Hemoglobin A1C: 4.9 % (ref 4.0–5.6)

## 2018-08-20 LAB — POCT URINALYSIS DIP (MANUAL ENTRY)
Glucose, UA: NEGATIVE mg/dL
Ketones, POC UA: NEGATIVE mg/dL
Leukocytes, UA: NEGATIVE
Nitrite, UA: NEGATIVE
Spec Grav, UA: 1.025 (ref 1.010–1.025)
Urobilinogen, UA: 1 E.U./dL
pH, UA: 6 (ref 5.0–8.0)

## 2018-08-20 LAB — GLUCOSE, POCT (MANUAL RESULT ENTRY): POC Glucose: 154 mg/dl — AB (ref 70–99)

## 2018-08-20 MED ORDER — ONDANSETRON HCL 4 MG PO TABS: 4.0000 mg | ORAL_TABLET | Freq: Three times a day (TID) | ORAL | 0 refills | Status: DC | PRN

## 2018-08-20 NOTE — Progress Notes (Signed)
Jose Snow 48 y.o.   Chief Complaint  Patient presents with  . Abdominal Pain    x 5 days    . Emesis    started the same time as stomach pain    HISTORY OF PRESENT ILLNESS: This is a 48 y.o. male complaining of upper abdominal pain with nausea and vomiting that started last Sunday, 4 days ago.  Better today, no longer vomiting, abdominal pain is intermittent, no pain now, no diarrhea.  Patient has a history of diabetes and liver cirrhosis.  Compliant with medication.  History obtained with interpreter in the room.  Denies any other significant symptoms.  Denies upper GI bleed or melena.  Denies fever or chills.  Feels better today than 2 days ago.  HPI   Prior to Admission medications   Medication Sig Start Date End Date Taking? Authorizing Provider  insulin glargine (LANTUS) 100 UNIT/ML injection Inject 9 Units into the skin daily.   Yes [provider]  lactulose (CHRONULAC) 10 GM/15ML solution Take 45 mLs (30 g total) by mouth daily. 05/14/18  Yes Horald Pollen, MD  omeprazole (PRILOSEC) 40 MG capsule Take 1 capsule (40 mg total) by mouth daily before breakfast. 04/17/18  Yes Milus Banister, MD  tenofovir (VIREAD) 300 MG tablet Take 300 mg by mouth daily.   Yes [provider]  bismuth-metronidazole-tetracycline (PYLERA) [phone] MG capsule Take 3 capsules by mouth 4 (four) times daily -  before meals and at bedtime for 14 days. 03/11/18 03/25/18  Milus Banister, MD  levocetirizine (XYZAL) 5 MG tablet TAKE 1 TABLET BY MOUTH EVERY EVENING Patient not taking: No sig reported 02/09/18   Rutherford Guys, MD  linagliptin (TRADJENTA) 5 MG TABS tablet Take 1 tablet (5 mg total) by mouth daily. 05/14/18 08/12/18  Horald Pollen, MD  nadolol (CORGARD) 20 MG tablet Take 1 tablet (20 mg total) by mouth daily. Patient not taking: Reported on 08/20/2018 04/17/18   Milus Banister, MD  spironolactone (ALDACTONE) 100 MG tablet Take 1 tablet (100 mg total) by  mouth daily. 11/06/17 03/02/18  Milus Banister, MD  sucralfate (CARAFATE) 1 GM/10ML suspension Take 10 mLs (1 g total) by mouth 4 (four) times daily. Take between meals and at bedtime. Patient not taking: Reported on 08/20/2018 05/14/18   Horald Pollen, MD    No Known Allergies  Patient Active Problem List   Diagnosis Date Noted  . Malnutrition of moderate degree 03/03/2018  . Epigastric pain 03/02/2018  . Pancytopenia ([place]) 12/03/2017  . Hyperglycemia 12/03/2017  . Cramps, extremity 12/03/2017  . Abnormal echocardiogram 12/03/2017  . New onset type 2 diabetes mellitus ([place]) 12/03/2017  . History of hepatitis B 12/03/2017  . Diarrhea   . Esophageal varices without bleeding ([place])   . Heart murmur 08/01/2017  . Hepatitis B 02/17/2015  . Elevated bilirubin   . Liver cirrhosis secondary to NASH (nonalcoholic steatohepatitis) ([place]) 11/25/2014  . Thrombocytopenia ([place]) 11/25/2014  . History of TB (tuberculosis) 07/16/2010    Past Medical History:  Diagnosis Date  . Cirrhosis ([place])   . Diabetes mellitus ([place])   . Hepatitis B   . Lymphocytic colitis   . 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     Past Surgical History:  Procedure Laterality Date  . BIOPSY  10/23/2017   Procedure: BIOPSY;  Surgeon: Milus Banister, MD;  Location: [facility];  Service: Endoscopy;;  . BIOPSY  03/03/2018   Procedure:  BIOPSY;  Surgeon: Irving Copas., MD;  Location: [facility];  Service: Gastroenterology;;  . COLONOSCOPY WITH PROPOFOL N/A 10/23/2017   Procedure: COLONOSCOPY WITH PROPOFOL;  Surgeon: Milus Banister, MD;  Location: [facility];  Service: Endoscopy;  Laterality: N/A;  . COMPLEX WOUND CLOSURE Right 12/15/2015   Procedure: COMPLEX WOUND CLOSURE;  Surgeon: Leanora Cover, MD;  Location: [facility];  Service: Orthopedics;  Laterality: Right;  . ESOPHAGOGASTRODUODENOSCOPY (EGD) WITH PROPOFOL N/A 10/23/2017   Procedure: ESOPHAGOGASTRODUODENOSCOPY (EGD) WITH PROPOFOL;   Surgeon: Milus Banister, MD;  Location: [facility];  Service: Endoscopy;  Laterality: N/A;  . ESOPHAGOGASTRODUODENOSCOPY (EGD) WITH PROPOFOL N/A 03/03/2018   Procedure: ESOPHAGOGASTRODUODENOSCOPY (EGD) WITH PROPOFOL;  Surgeon: Rush Landmark Telford Nab., MD;  Location: [facility];  Service: Gastroenterology;  Laterality: N/A;  . I&D EXTREMITY Right 12/15/2015   Procedure: IRRIGATION AND DEBRIDEMENT AND REVISION AMPUTATION RIGHT RING FINGER;  Surgeon: Leanora Cover, MD;  Location: [facility];  Service: Orthopedics;  Laterality: Right;    Social History   Socioeconomic History  . Marital status: Married    Spouse name: Not on file  . Number of children: 4  . Years of education: Not on file  . Highest education level: Not on file  Occupational History  . 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 Financial resource strain: Not on file  . Food insecurity:    Worry: Not on file    Inability: Not on file  . Transportation needs:    Medical: Not on file    Non-medical: Not on file  Tobacco Use  . Smoking status: Former Smoker    Packs/day: 0.50    Years: 8.00    Pack years: 4.00    Types: Cigarettes    Last attempt to quit: 04/08/1990    Years since quitting: 28.3  . Smokeless tobacco: Never Used  Substance and Sexual Activity  . Alcohol use: No  . Drug use: No  . Sexual activity: Yes  Lifestyle  . Physical activity:    Days per week: Not on file    Minutes per session: Not on file  . Stress: Not on file  Relationships  . Social connections:    Talks on phone: Not on file    Gets together: Not on file    Attends religious service: Not on file    Active member of club or organization: Not on file    Attends meetings of clubs or organizations: Not on file    Relationship status: Not on file  . Intimate partner violence:    Fear of current or ex partner: Not on file    Emotionally abused: Not on file    Physically abused: Not on file    Forced sexual  activity: Not on file  Other Topics Concern  . 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  Lives with wife and four children.     Family History  Problem Relation Age of Onset  . Colon cancer Neg Hx   . Esophageal cancer Neg Hx   . Pancreatic cancer Neg Hx   . Stomach cancer Neg Hx   . Liver disease Neg Hx      Review of Systems  Constitutional: Negative.  Negative for chills and fever.  HENT: Negative for congestion and sore throat.   Respiratory: Negative.  Negative for cough and shortness of breath.   Cardiovascular: Negative for chest pain and palpitations.  Gastrointestinal:  Positive for abdominal pain and nausea. Negative for blood in stool, diarrhea, heartburn, melena and vomiting.  Genitourinary: Negative.  Negative for dysuria and hematuria.  Musculoskeletal: Negative for myalgias.  Skin: Negative.   Neurological: Negative for dizziness and headaches.  Endo/Heme/Allergies: Negative.   All other systems reviewed and are negative.   Vitals:   08/20/18 1614  BP: 101/66  Pulse: 65  Resp: 16  Temp: 98.6 F (37 C)  SpO2: 98%    Physical Exam Vitals signs reviewed.  Constitutional:      Appearance: Normal appearance. He is well-developed.  HENT:     Head: Normocephalic and atraumatic.     Nose: Nose normal.     Mouth/Throat:     Mouth: Mucous membranes are moist.     Pharynx: Oropharynx is clear.  Eyes:     Extraocular Movements: Extraocular movements intact.     Conjunctiva/sclera: Conjunctivae normal.     Pupils: Pupils are equal, round, and reactive to light.  Neck:     Musculoskeletal: Normal range of motion and neck supple.  Cardiovascular:     Rate and Rhythm: Normal rate and regular rhythm.     Heart sounds: Normal heart sounds.  Pulmonary:     Effort: Pulmonary effort is normal.     Breath sounds: Normal breath sounds.  Abdominal:     General: Abdomen is flat. Bowel sounds are normal. There is no distension.      Palpations: Abdomen is soft. There is no mass.     Tenderness: There is abdominal tenderness (Mild epigastric tenderness). There is no guarding or rebound.  Musculoskeletal: Normal range of motion.  Skin:    General: Skin is warm and dry.     Capillary Refill: Capillary refill takes less than 2 seconds.  Neurological:     General: No focal deficit present.     Mental Status: He is alert and oriented to person, place, and time.  Psychiatric:        Mood and Affect: Mood normal.        Behavior: Behavior normal.      Results for orders placed or performed in visit on 08/20/18 (from the past 24 hour(s))  POCT glucose (manual entry)     Status: Abnormal   Collection Time: 08/20/18  4:36 PM  Result Value Ref Range   POC Glucose 154 (A) 70 - 99 mg/dl  POCT glycosylated hemoglobin (Hb A1C)     Status: None   Collection Time: 08/20/18  4:44 PM  Result Value Ref Range   Hemoglobin A1C 4.9 4.0 - 5.6 %   HbA1c POC (<> result, manual entry)     HbA1c, POC (prediabetic range)     HbA1c, POC (controlled diabetic range)    POCT CBC     Status: Abnormal   Collection Time: 08/20/18  4:44 PM  Result Value Ref Range   WBC 2.7 (A) 4.6 - 10.2 K/uL   Lymph, poc 1.0 0.6 - 3.4   POC LYMPH PERCENT 36.3 10 - 50 %L   MID (cbc) 0.3 0 - 0.9   POC MID % 11.6 0 - 12 %M   POC Granulocyte 1.4 (A) 2 - 6.9   Granulocyte percent 52.1 37 - 80 %G   RBC 3.54 (A) 4.69 - 6.13 M/uL   Hemoglobin 11.5 11 - 14.6 g/dL   HCT, POC 34.5 29 - 41 %   MCV 97.6 76 - 111 fL   MCH, POC 32.6 (A) 27 - 31.2 pg  MCHC 33.4 31.8 - 35.4 g/dL   RDW, POC 15.8 %   Platelet Count, POC 24 (A) 142 - 424 K/uL   MPV 9.8 0 - 99.8 fL  POCT urinalysis dipstick     Status: Abnormal   Collection Time: 08/20/18  5:06 PM  Result Value Ref Range   Color, UA yellow yellow   Clarity, UA clear clear   Glucose, UA negative negative mg/dL   Bilirubin, UA small (A) negative   Ketones, POC UA negative negative mg/dL   Spec Grav, UA 1.025 1.010 -  1.025   Blood, UA trace-lysed (A) negative   pH, UA 6.0 5.0 - 8.0   Protein Ur, POC trace (A) negative mg/dL   Urobilinogen, UA 1.0 0.2 or 1.0 E.U./dL   Nitrite, UA Negative Negative   Leukocytes, UA Negative Negative       ASSESSMENT & PLAN: Y was seen today for abdominal pain and emesis.  Diagnoses and all orders for this visit:  Epigastric pain  Need for prophylactic vaccination against Streptococcus pneumoniae (pneumococcus) -     Pneumococcal polysaccharide vaccine 23-valent greater than or equal to 2yo subcutaneous/IM  Type 2 diabetes mellitus with other specified complication, without long-term current use of insulin (HCC) -     Cancel: Hemoglobin A1c -     Comprehensive metabolic panel -     POCT glucose (manual entry) -     POCT glycosylated hemoglobin (Hb A1C) -     POCT CBC -     POCT urinalysis dipstick  Nausea without vomiting -     ondansetron (ZOFRAN) 4 MG tablet; Take 1 tablet (4 mg total) by mouth every 8 (eight) hours as needed for nausea or vomiting.  Liver cirrhosis secondary to NASH (nonalcoholic steatohepatitis) ([place])     Patient Instructions    Take Mylanta or Maalox over-the-counter every 3-4 hours as needed.  Zofran as needed. Monitor symptoms and contact the office if worse or the next several days.   If you have lab work done today you will be contacted with your lab results within the next 2 weeks.  If you have not heard from [place] then please contact us. The fastest way to get your results is to register for My Chart.   IF you received an x-ray today, you will receive an invoice from [facility]. Please contact [facility] at [phone] with questions or concerns regarding your invoice.   IF you received labwork today, you will receive an invoice from [facility]. Please contact [facility] at [phone] with questions or concerns regarding your invoice.   Our billing staff will not be able to assist you with questions  regarding bills from these companies.  You will be contacted with the lab results as soon as they are available. The fastest way to get your results is to activate your My Chart account. Instructions are located on the last page of this paperwork. If you have not heard from [place] regarding the results in 2 weeks, please contact this office.     Abdominal Pain, Adult  Many things can cause belly (abdominal) pain. Most times, belly pain is not dangerous. Many cases of belly pain can be watched and treated at home. Sometimes belly pain is serious, though. Your doctor will try to find the cause of your belly pain. Follow these instructions at home:  Take over-the-counter and prescription medicines only as told by your doctor. Do not take medicines that help you poop (laxatives) unless told to by  your doctor.  Drink enough fluid to keep your pee (urine) clear or pale yellow.  Watch your belly pain for any changes.  Keep all follow-up visits as told by your doctor. This is important. Contact a doctor if:  Your belly pain changes or gets worse.  You are not hungry, or you lose weight without trying.  You are having trouble pooping (constipated) or have watery poop (diarrhea) for more than 2-3 days.  You have pain when you pee or poop.  Your belly pain wakes you up at night.  Your pain gets worse with meals, after eating, or with certain foods.  You are throwing up and cannot keep anything down.  You have a fever. Get help right away if:  Your pain does not go away as soon as your doctor says it should.  You cannot stop throwing up.  Your pain is only in areas of your belly, such as the right side or the left lower part of the belly.  You have bloody or black poop, or poop that looks like tar.  You have very bad pain, cramping, or bloating in your belly.  You have signs of not having enough fluid or water in your body (dehydration), such as: ? Dark pee, very little pee, or no  pee. ? Cracked lips. ? Dry mouth. ? Sunken eyes. ? Sleepiness. ? Weakness. This information is not intended to replace advice given to you by your health care provider. Make sure you discuss any questions you have with your health care provider. Document Released: 09/11/2007 Document Revised: 10/13/2015 Document Reviewed: 09/06/2015 Elsevier Interactive Patient Education  2019 Elsevier Inc.      Agustina Caroli, MD [facility]

## 2018-08-20 NOTE — Patient Instructions (Addendum)
  Take Mylanta or Maalox over-the-counter every 3-4 hours as needed.  Zofran as needed. Monitor symptoms and contact the office if worse or the next several days.   If you have lab work done today you will be contacted with your lab results within the next 2 weeks.  If you have not heard from [place] then please contact us. The fastest way to get your results is to register for My Chart.   IF you received an x-ray today, you will receive an invoice from [facility]. Please contact [facility] at [phone] with questions or concerns regarding your invoice.   IF you received labwork today, you will receive an invoice from [facility]. Please contact [facility] at [phone] with questions or concerns regarding your invoice.   Our billing staff will not be able to assist you with questions regarding bills from these companies.  You will be contacted with the lab results as soon as they are available. The fastest way to get your results is to activate your My Chart account. Instructions are located on the last page of this paperwork. If you have not heard from [place] regarding the results in 2 weeks, please contact this office.     Abdominal Pain, Adult  Many things can cause belly (abdominal) pain. Most times, belly pain is not dangerous. Many cases of belly pain can be watched and treated at home. Sometimes belly pain is serious, though. Your doctor will try to find the cause of your belly pain. Follow these instructions at home:  Take over-the-counter and prescription medicines only as told by your doctor. Do not take medicines that help you poop (laxatives) unless told to by your doctor.  Drink enough fluid to keep your pee (urine) clear or pale yellow.  Watch your belly pain for any changes.  Keep all follow-up visits as told by your doctor. This is important. Contact a doctor if:  Your belly pain changes or gets worse.  You are not hungry, or you lose weight without  trying.  You are having trouble pooping (constipated) or have watery poop (diarrhea) for more than 2-3 days.  You have pain when you pee or poop.  Your belly pain wakes you up at night.  Your pain gets worse with meals, after eating, or with certain foods.  You are throwing up and cannot keep anything down.  You have a fever. Get help right away if:  Your pain does not go away as soon as your doctor says it should.  You cannot stop throwing up.  Your pain is only in areas of your belly, such as the right side or the left lower part of the belly.  You have bloody or black poop, or poop that looks like tar.  You have very bad pain, cramping, or bloating in your belly.  You have signs of not having enough fluid or water in your body (dehydration), such as: ? Dark pee, very little pee, or no pee. ? Cracked lips. ? Dry mouth. ? Sunken eyes. ? Sleepiness. ? Weakness. This information is not intended to replace advice given to you by your health care provider. Make sure you discuss any questions you have with your health care provider. Document Released: 09/11/2007 Document Revised: 10/13/2015 Document Reviewed: 09/06/2015 Elsevier Interactive Patient Education  2019 [employer].

## 2018-08-21 LAB — COMPREHENSIVE METABOLIC PANEL
ALT: 24 IU/L (ref 0–44)
AST: 32 IU/L (ref 0–40)
Albumin/Globulin Ratio: 1.2 (ref 1.2–2.2)
Albumin: 3.6 g/dL — ABNORMAL LOW (ref 4.0–5.0)
Alkaline Phosphatase: 70 IU/L (ref 39–117)
BUN/Creatinine Ratio: 15 (ref 9–20)
BUN: 17 mg/dL (ref 6–24)
Bilirubin Total: 1.7 mg/dL — ABNORMAL HIGH (ref 0.0–1.2)
CO2: 26 mmol/L (ref 20–29)
Calcium: 8.7 mg/dL (ref 8.7–10.2)
Chloride: 104 mmol/L (ref 96–106)
Creatinine, Ser: 1.15 mg/dL (ref 0.76–1.27)
GFR calc Af Amer: 85 mL/min/{1.73_m2} (ref >59)
GFR calc non Af Amer: 74 mL/min/{1.73_m2} (ref >59)
Globulin, Total: 2.9 g/dL (ref 1.5–4.5)
Glucose: 149 mg/dL — ABNORMAL HIGH (ref 65–99)
Potassium: 4.3 mmol/L (ref 3.5–5.2)
Sodium: 139 mmol/L (ref 134–144)
Total Protein: 6.5 g/dL (ref 6.0–8.5)

## 2018-08-26 ENCOUNTER — Telehealth: Payer: Self-pay | Admitting: Gastroenterology

## 2018-08-26 ENCOUNTER — Telehealth: Payer: Self-pay | Admitting: Emergency Medicine

## 2018-08-26 NOTE — Telephone Encounter (Signed)
Left message on machine to call back  

## 2018-08-26 NOTE — Telephone Encounter (Signed)
The pt's daughter called to ask questions about the pt's platelet count.  I advised her to call the PCP in regards to that question.  The pt has been advised of the information and verbalized understanding.

## 2018-08-26 NOTE — Telephone Encounter (Signed)
Copied from [place] [id number]. Topic: General - Inquiry >> Aug 26, 2018  3:59 PM Mathis Bud wrote: Reason for CRM: Patient daughter called stating her dad(patient) [facility] for a catheter.  Patients blood palettes were too low. Patient was advised to reach out to PCP to see what PCP could do before rescheduled. Call back #  [phone]

## 2018-08-26 NOTE — Telephone Encounter (Signed)
Pt daughter called and wanted to speak with the nurse. She had some questions about her fathers low blood platelet count and needed som advice.

## 2018-08-28 ENCOUNTER — Telehealth: Payer: Self-pay | Admitting: Emergency Medicine

## 2018-08-28 NOTE — Telephone Encounter (Signed)
Pt is asking for medication that has not been prescribed by you, please review

## 2018-08-28 NOTE — Telephone Encounter (Signed)
Copied from [place] [id number]. Topic: Quick Communication - Rx Refill/Question >> Aug 28, 2018 11:37 AM Virl Axe D wrote: Medication: tenofovir (VIREAD) 300 MG tablet  Has the patient contacted their pharmacy? Yes.   (Agent: If no, request that the patient contact the pharmacy for the refill.) (Agent: If yes, when and what did the pharmacy advise?)  Preferred Pharmacy (with phone number or street name): [id number] - [place], [place] [phone] (Phone) [phone] (Fax)    Agent: Please be advised that RX refills may take up to 3 business days. We ask that you follow-up with your pharmacy.

## 2018-08-29 ENCOUNTER — Other Ambulatory Visit: Payer: Self-pay | Admitting: Family

## 2018-08-31 ENCOUNTER — Other Ambulatory Visit: Payer: Self-pay | Admitting: Emergency Medicine

## 2018-08-31 NOTE — Telephone Encounter (Signed)
Already ordered by Mauricio Po on 5/23 2020.  Thanks

## 2018-09-02 IMAGING — US US ABDOMEN COMPLETE
1 series · 13 of 25 positions shown · non-contrast
Comparison: CT abdomen pelvis 08/14/2017

CLINICAL DATA: Cirrhosis of liver without ascites, unspecified
hepatic cirrhosis type, diarrhea, hepatoma screening, former smoker,
hepatitis-B, TB

EXAM:
ABDOMEN ULTRASOUND COMPLETE

[Series 1: us abdomen complete · 13 of 99 slices shown]
[im 1/99]
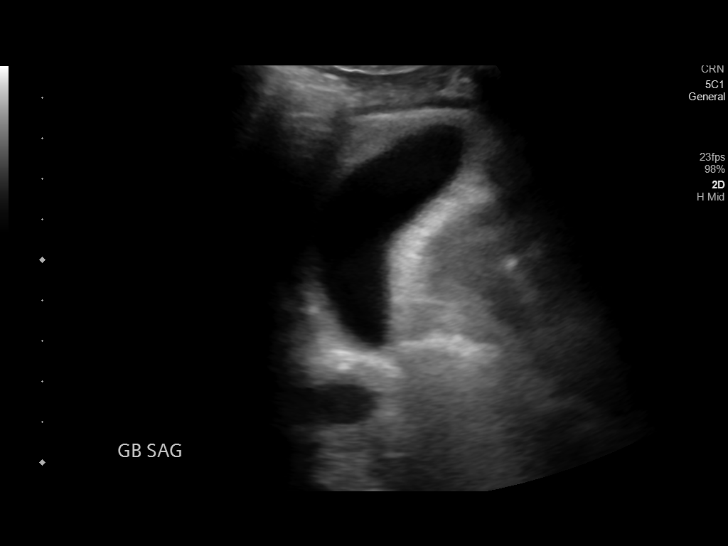
[im 9/99]
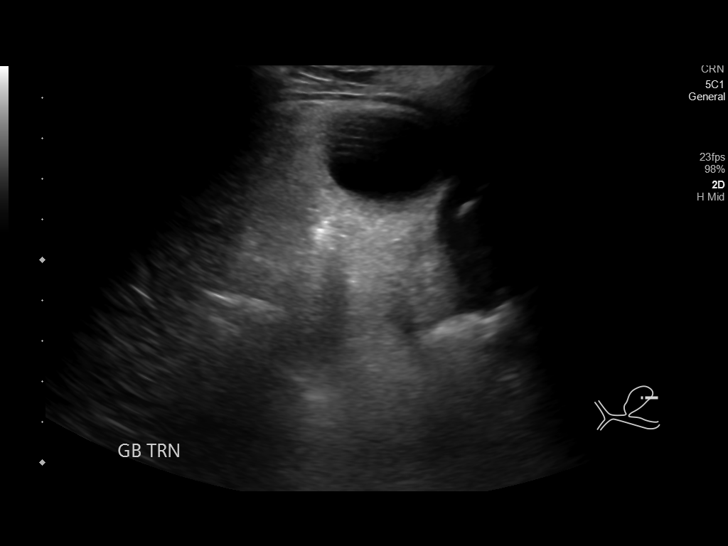
[im 17/99]
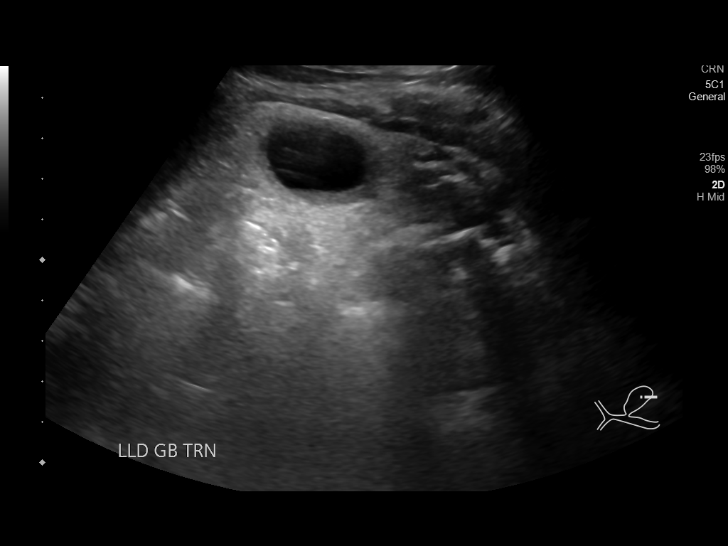
[im 25/99]
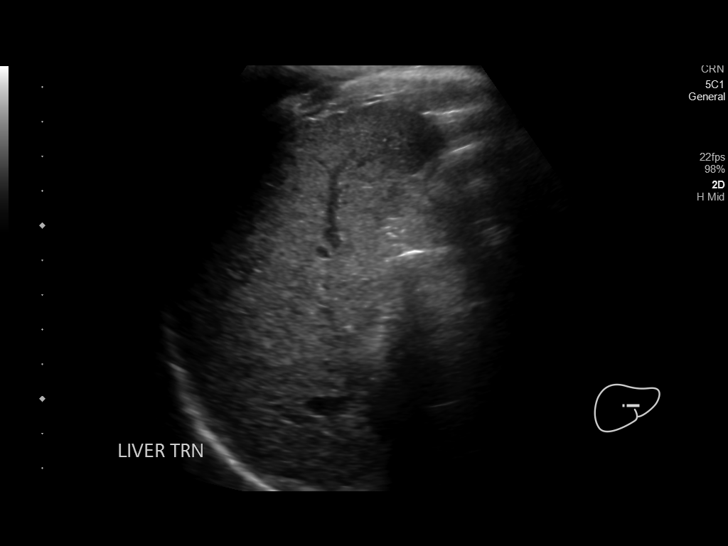
[im 33/99]
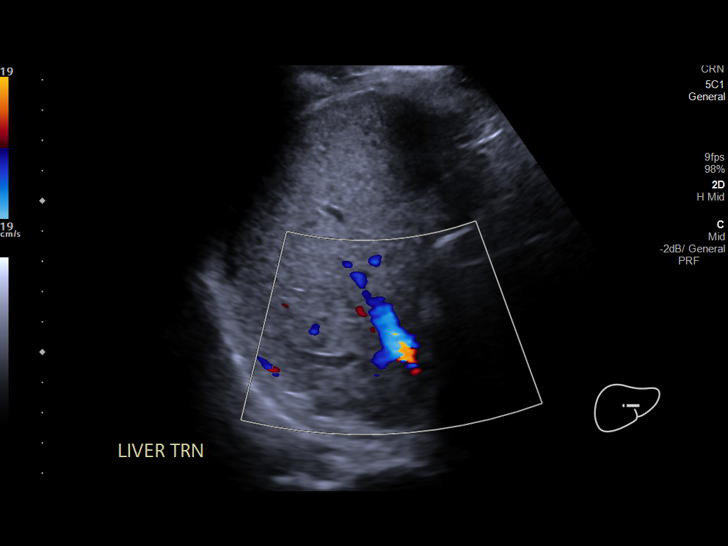
[im 41/99]
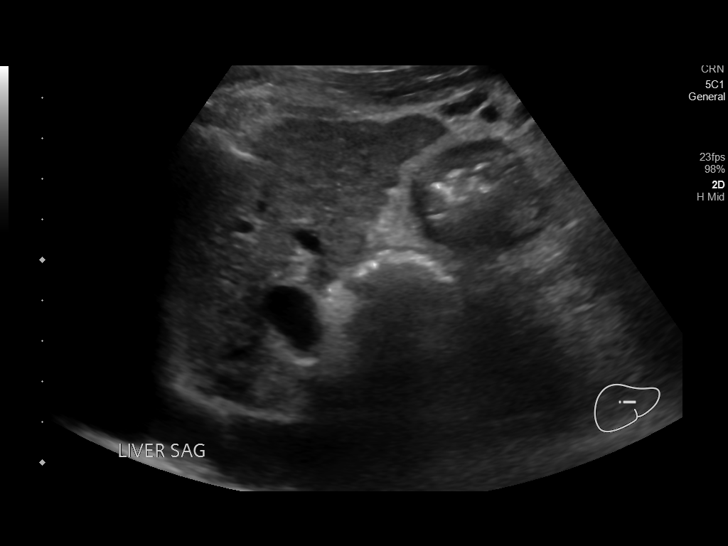
[im 50/99]
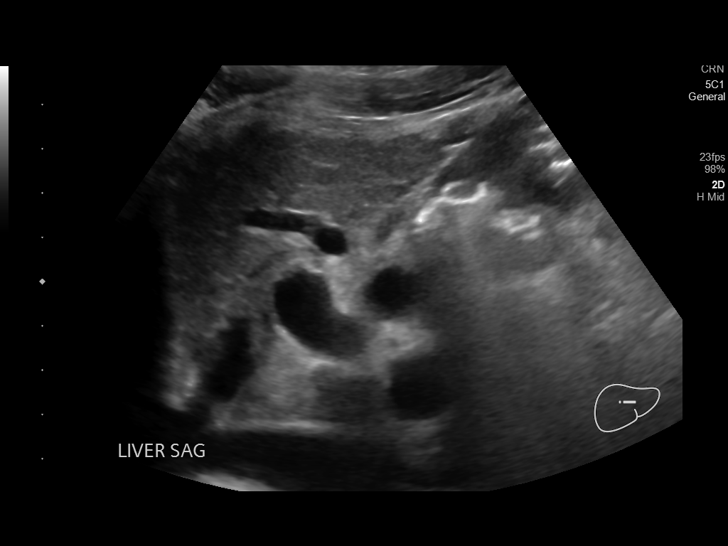
[im 58/99]
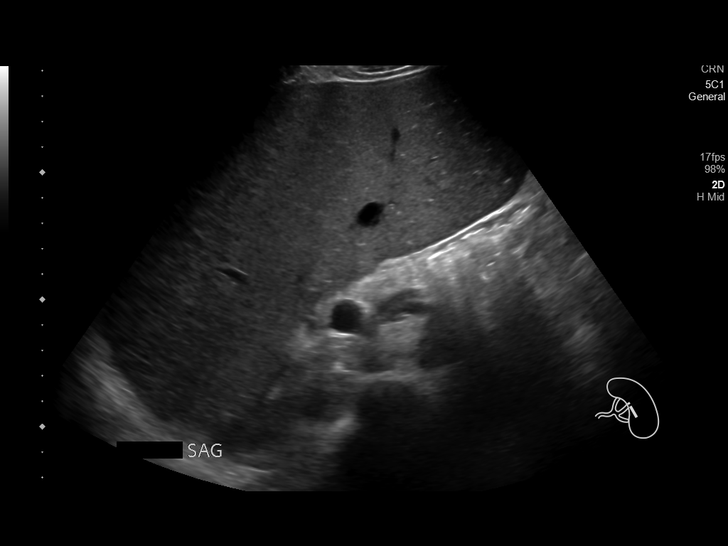
[im 66/99]
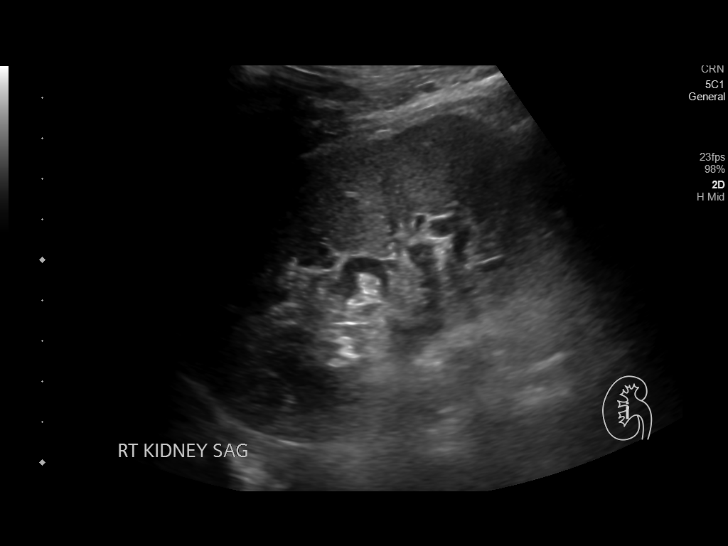
[im 74/99]
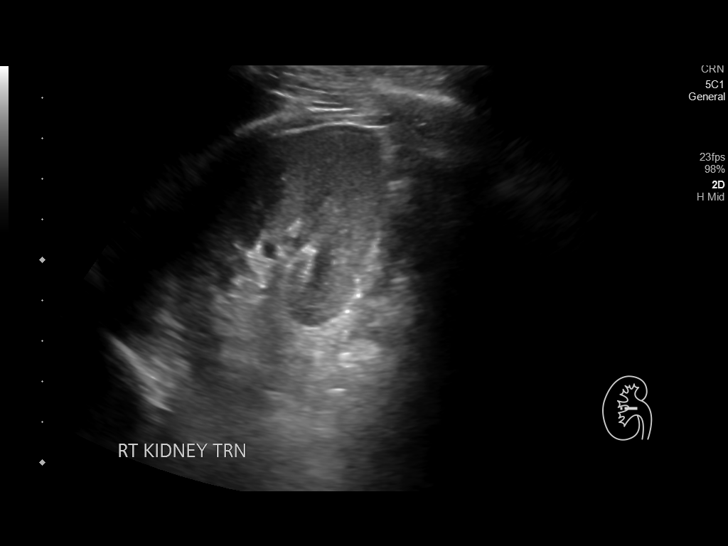
[im 82/99]
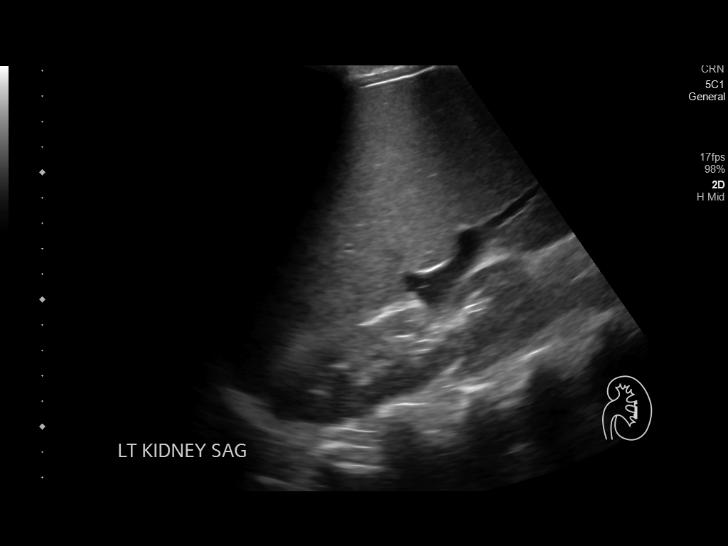
[im 90/99]
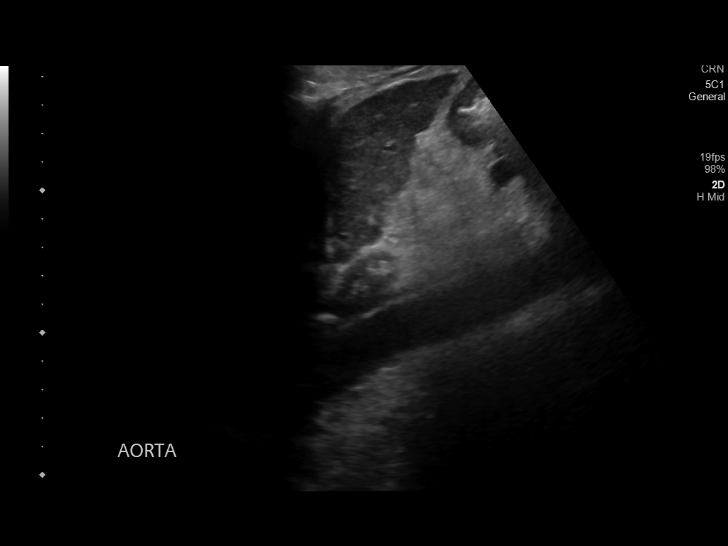
[im 99/99]
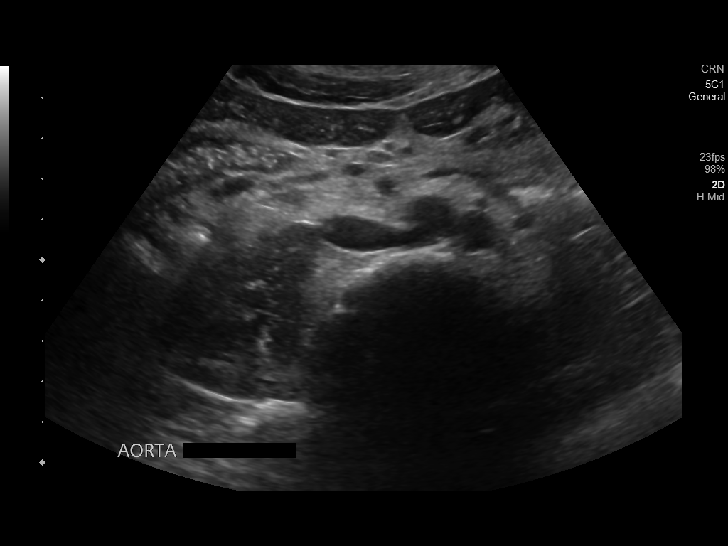

[13 of 25 positions shown; findings below may reference images not displayed]

FINDINGS: Gallbladder: Normally distended with minimal sludge. Mild
gallbladder wall thickening. No pericholecystic fluid or sonographic
Murphy sign. No shadowing calculi.

Common bile duct: Diameter: 3 mm diameter, normal

Liver: Nodular hepatic contours consistent with cirrhosis. Mildly
heterogeneous echogenicity. No discrete hepatic mass. Portal vein is
patent on color Doppler imaging with normal direction of blood flow
towards the liver.

IVC: Normal appearance

Pancreas: Normal appearance

Spleen: Enlarged at 17.0 cm length with calculated volume of 5558
mL. No focal lesions.

Right Kidney: Length: 5.6 cm. Normal morphology without mass or
hydronephrosis.

Left Kidney: Length: 11.9 cm. Normal morphology without mass or
hydronephrosis.

Abdominal aorta: Normal caliber

Other findings: No free fluid
IMPRESSION: Cirrhotic liver with splenomegaly.

Minimal gallbladder sludge.

Nonspecific mild gallbladder wall thickening without evidence of
cholelithiasis.

## 2018-09-02 NOTE — Telephone Encounter (Signed)
Spoke with pt daughter about her concerns and she would like to know what her dad should do about his low palette count? They would like him to get it recheck before his next catheter appointment. Should I schdule him an appointment for and OV or just a lab draw?

## 2018-09-02 NOTE — Telephone Encounter (Signed)
Spoke to the daughter today and advised her of Dr. Mitchel Honour recommendation, she verbalized understanding and informed me that if she has anymore questions she will give [place] a call here at the office.

## 2018-09-02 NOTE — Telephone Encounter (Signed)
OK will advise the daughter, thanks

## 2018-09-02 NOTE — Telephone Encounter (Signed)
Do not schedule OV or lab draw.  Not necessary.  Not much to do about the platelet count.  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  Will always have chronic low platelet count.

## 2018-10-02 ENCOUNTER — Telehealth: Payer: Self-pay | Admitting: Emergency Medicine

## 2018-10-02 NOTE — Telephone Encounter (Signed)
Medication Refill - Medication:   tenofovir (VIREAD) 300 MG tablet     Has the patient contacted their pharmacy? Yes - states no refills left (Agent: If no, request that the patient contact the pharmacy for the refill.) (Agent: If yes, when and what did the pharmacy advise?)  Preferred Pharmacy (with phone number or street name): [facility] #[id number] - [place], [place] [phone] (Phone) [phone] (Fax)     Agent: Please be advised that RX refills may take up to 3 business days. We ask that you follow-up with your pharmacy.

## 2018-10-04 ENCOUNTER — Other Ambulatory Visit: Payer: Self-pay | Admitting: Family

## 2018-10-05 NOTE — Telephone Encounter (Signed)
Need to schdule an appointment with Dr. Mitchel Honour

## 2018-10-30 ENCOUNTER — Other Ambulatory Visit: Payer: Self-pay | Admitting: Gastroenterology

## 2018-11-01 ENCOUNTER — Other Ambulatory Visit: Payer: Self-pay | Admitting: Family

## 2018-11-02 ENCOUNTER — Telehealth: Payer: Self-pay | Admitting: *Deleted

## 2018-11-02 NOTE — Telephone Encounter (Signed)
Received refill request for viread. Patient was seen once for Hepatitis B 07/2017, has not followed up in clinic. Per chart, patient's daughter has been calling his providers on his behalf, there is notation allowing all [facility] to call his daughter.  RN made call, had to leave message with our phone number.  Will send 30 day refill, need assistance getting patient back to clinic.  Landis Gandy, RN

## 2018-11-02 NOTE — Telephone Encounter (Signed)
Viread is not medication we prescribed. It should be refilled by his specialist. Thanks.

## 2018-11-04 NOTE — Telephone Encounter (Signed)
He will need a follow up appointment for lab work. If he has not been taking it, I would avoid starting to take it until seen as there is a risk of flare if he starts it and does not show for the appointment.

## 2018-11-04 NOTE — Telephone Encounter (Signed)
Attempted again to reach daughter and patient - neither call could be completed at this time. Will follow with pharmacy for fill pattern. Landis Gandy, RN

## 2018-11-04 NOTE — Telephone Encounter (Signed)
Attempted to call pt no answer and unable to leave message

## 2018-11-06 ENCOUNTER — Other Ambulatory Visit: Payer: Self-pay | Admitting: Gastroenterology

## 2018-12-02 ENCOUNTER — Other Ambulatory Visit: Payer: Self-pay | Admitting: Family

## 2018-12-02 ENCOUNTER — Telehealth: Payer: Self-pay

## 2018-12-02 NOTE — Telephone Encounter (Signed)
Attempt all numbers listed. Left voicemail with Daughter to give office a call to schedule appointment ASAP.  Jose Snow

## 2018-12-09 ENCOUNTER — Encounter (HOSPITAL_COMMUNITY): Payer: Self-pay | Admitting: Emergency Medicine

## 2018-12-09 ENCOUNTER — Emergency Department (HOSPITAL_COMMUNITY): Payer: 59

## 2018-12-09 DIAGNOSIS — D61818 Other pancytopenia: Secondary | ICD-10-CM | POA: Diagnosis not present

## 2018-12-09 DIAGNOSIS — Z20828 Contact with and (suspected) exposure to other viral communicable diseases: Secondary | ICD-10-CM | POA: Insufficient documentation

## 2018-12-09 DIAGNOSIS — E119 Type 2 diabetes mellitus without complications: Secondary | ICD-10-CM | POA: Insufficient documentation

## 2018-12-09 DIAGNOSIS — Z87891 Personal history of nicotine dependence: Secondary | ICD-10-CM | POA: Diagnosis not present

## 2018-12-09 DIAGNOSIS — Z79899 Other long term (current) drug therapy: Secondary | ICD-10-CM | POA: Diagnosis not present

## 2018-12-09 DIAGNOSIS — R042 Hemoptysis: Secondary | ICD-10-CM | POA: Insufficient documentation

## 2018-12-09 DIAGNOSIS — K7469 Other cirrhosis of liver: Secondary | ICD-10-CM | POA: Diagnosis not present

## 2018-12-09 DIAGNOSIS — J40 Bronchitis, not specified as acute or chronic: Secondary | ICD-10-CM | POA: Diagnosis not present

## 2018-12-09 MED ORDER — SODIUM CHLORIDE 0.9% FLUSH
3.0000 mL | Freq: Once | INTRAVENOUS | Status: AC
  Administered 2018-12-10: 3 mL via INTRAVENOUS

## 2018-12-09 NOTE — ED Triage Notes (Signed)
Patient here from home with complaints of coughing up blood x2 months. Increased bright red blood today. Hx of diabetes and cirrhosis.

## 2018-12-10 ENCOUNTER — Emergency Department (HOSPITAL_COMMUNITY)
Admission: EM | Admit: 2018-12-10 | Discharge: 2018-12-10 | Disposition: A | Discharge status: Home / Self Care | Payer: 59 | Attending: Emergency Medicine | Admitting: Emergency Medicine

## 2018-12-10 ENCOUNTER — Encounter: Payer: Self-pay | Admitting: Family

## 2018-12-10 ENCOUNTER — Other Ambulatory Visit: Payer: Self-pay

## 2018-12-10 ENCOUNTER — Encounter (HOSPITAL_COMMUNITY): Payer: Self-pay | Admitting: Radiology

## 2018-12-10 ENCOUNTER — Ambulatory Visit: Payer: 59 | Admitting: Family

## 2018-12-10 ENCOUNTER — Emergency Department (HOSPITAL_COMMUNITY): Payer: 59

## 2018-12-10 VITALS — BP 109/43 | HR 65 | Temp 98.1°F

## 2018-12-10 DIAGNOSIS — B181 Chronic viral hepatitis B without delta-agent: Secondary | ICD-10-CM

## 2018-12-10 DIAGNOSIS — Z8619 Personal history of other infectious and parasitic diseases: Secondary | ICD-10-CM

## 2018-12-10 DIAGNOSIS — R05 Cough: Secondary | ICD-10-CM

## 2018-12-10 DIAGNOSIS — R059 Cough, unspecified: Secondary | ICD-10-CM

## 2018-12-10 DIAGNOSIS — K7469 Other cirrhosis of liver: Secondary | ICD-10-CM

## 2018-12-10 DIAGNOSIS — K7460 Unspecified cirrhosis of liver: Secondary | ICD-10-CM | POA: Diagnosis not present

## 2018-12-10 DIAGNOSIS — D61818 Other pancytopenia: Secondary | ICD-10-CM

## 2018-12-10 DIAGNOSIS — B1910 Unspecified viral hepatitis B without hepatic coma: Secondary | ICD-10-CM | POA: Diagnosis not present

## 2018-12-10 DIAGNOSIS — R042 Hemoptysis: Secondary | ICD-10-CM

## 2018-12-10 DIAGNOSIS — J40 Bronchitis, not specified as acute or chronic: Secondary | ICD-10-CM

## 2018-12-10 HISTORY — DX: Unspecified cirrhosis of liver: K74.60

## 2018-12-10 HISTORY — DX: Other pancytopenia: D61.818

## 2018-12-10 LAB — CBC WITH DIFFERENTIAL/PLATELET
Abs Immature Granulocytes: 0.01 10*3/uL (ref 0.00–0.07)
Basophils Absolute: 0 10*3/uL (ref 0.0–0.1)
Basophils Relative: 1 %
Eosinophils Absolute: 0.2 10*3/uL (ref 0.0–0.5)
Eosinophils Relative: 8 %
HCT: 34.7 % — ABNORMAL LOW (ref 39.0–52.0)
Hemoglobin: 11.7 g/dL — ABNORMAL LOW (ref 13.0–17.0)
Immature Granulocytes: 1 %
Lymphocytes Relative: 39 %
Lymphs Abs: 0.8 10*3/uL (ref 0.7–4.0)
MCH: 33.8 pg (ref 26.0–34.0)
MCHC: 33.7 g/dL (ref 30.0–36.0)
MCV: 100.3 fL — ABNORMAL HIGH (ref 80.0–100.0)
Monocytes Absolute: 0.2 10*3/uL (ref 0.1–1.0)
Monocytes Relative: 10 %
Neutro Abs: 0.9 10*3/uL — ABNORMAL LOW (ref 1.7–7.7)
Neutrophils Relative %: 41 %
Platelets: 19 10*3/uL — CL (ref 150–400)
RBC: 3.46 MIL/uL — ABNORMAL LOW (ref 4.22–5.81)
RDW: 15 % (ref 11.5–15.5)
WBC: 2.1 10*3/uL — ABNORMAL LOW (ref 4.0–10.5)
nRBC: 0 % (ref 0.0–0.2)

## 2018-12-10 LAB — APTT: aPTT: 42 seconds — ABNORMAL HIGH (ref 24–36)

## 2018-12-10 LAB — TYPE AND SCREEN
ABO/RH(D): B POS
Antibody Screen: NEGATIVE

## 2018-12-10 LAB — COMPREHENSIVE METABOLIC PANEL
ALT: 24 U/L (ref 0–44)
AST: 31 U/L (ref 15–41)
Albumin: 4 g/dL (ref 3.5–5.0)
Alkaline Phosphatase: 59 U/L (ref 38–126)
Anion gap: 5 (ref 5–15)
BUN: 23 mg/dL — ABNORMAL HIGH (ref 6–20)
CO2: 26 mmol/L (ref 22–32)
Calcium: 9 mg/dL (ref 8.9–10.3)
Chloride: 104 mmol/L (ref 98–111)
Creatinine, Ser: 1.22 mg/dL (ref 0.61–1.24)
GFR calc Af Amer: >60 mL/min (ref >60)
GFR calc non Af Amer: >60 mL/min (ref >60)
Glucose, Bld: 124 mg/dL — ABNORMAL HIGH (ref 70–99)
Potassium: 4.6 mmol/L (ref 3.5–5.1)
Sodium: 135 mmol/L (ref 135–145)
Total Bilirubin: 2.3 mg/dL — ABNORMAL HIGH (ref 0.3–1.2)
Total Protein: 7.5 g/dL (ref 6.5–8.1)

## 2018-12-10 LAB — HIV ANTIBODY (ROUTINE TESTING W REFLEX): HIV Screen 4th Generation wRfx: NONREACTIVE

## 2018-12-10 LAB — PROTIME-INR
INR: 1.3 — ABNORMAL HIGH (ref 0.8–1.2)
Prothrombin Time: 16 seconds — ABNORMAL HIGH (ref 11.4–15.2)

## 2018-12-10 LAB — TROPONIN I (HIGH SENSITIVITY)
Troponin I (High Sensitivity): 2 ng/L (ref <18)
Troponin I (High Sensitivity): 2 ng/L (ref <18)

## 2018-12-10 LAB — SARS CORONAVIRUS 2 BY RT PCR (HOSPITAL ORDER, PERFORMED IN ~~LOC~~ HOSPITAL LAB): SARS Coronavirus 2: NEGATIVE

## 2018-12-10 MED ORDER — DOXYCYCLINE HYCLATE 100 MG PO TABS: 100.0000 mg | ORAL_TABLET | Freq: Two times a day (BID) | ORAL | 0 refills | Status: DC

## 2018-12-10 MED ORDER — TENOFOVIR DISOPROXIL FUMARATE 300 MG PO TABS: 300.0000 mg | ORAL_TABLET | Freq: Every day | ORAL | 5 refills | Status: DC

## 2018-12-10 MED ORDER — SODIUM CHLORIDE (PF) 0.9 % IJ SOLN
INTRAMUSCULAR | Status: AC
  Filled 2018-12-10: qty 50

## 2018-12-10 MED ORDER — IOHEXOL 350 MG/ML SOLN
100.0000 mL | Freq: Once | INTRAVENOUS | Status: AC | PRN
  Administered 2018-12-10: 100 mL via INTRAVENOUS

## 2018-12-10 NOTE — ED Notes (Addendum)
Critical Value-   Platelet Count- 19   Thurnell Garbe, MD made aware.  No orders at this time. Will continue to monitor.

## 2018-12-10 NOTE — Discharge Instructions (Signed)
Take the prescription as directed. Your blood counts were low; per your baseline. Your TB testing is pending.  Call your regular medical doctor today to schedule a follow up appointment within the next 2 days. Call the Infectious Disease doctor today to schedule a follow up appointment within the next week.  Return to the Emergency Department immediately sooner if worsening.

## 2018-12-10 NOTE — ED Notes (Signed)
No orders at this time to address Critical Value--platelets.

## 2018-12-10 NOTE — ED Provider Notes (Signed)
Pt received at sign out with CTA pending. See previous EDP note for full HPI/H&P/MDM. 48yo M, c/o flecks of blood in cough. No vomiting blood, no melena. BP per baseline. Pt has ambulated with steady gait, easy resps, Sats 100% R/A. Labs per baseline. CT without PE, question possible acute infection. TB and HIV testing pending. Epic chart reviewed: apparently pt's family has been calling ID MD office for the past 2 months. No clear indication for admission at this time. Pt states he has f/u scheduled with his PMD already. 1405:  T/C returned from ID Dr. Megan Salon, case discussed, including:  HPI, pertinent PM/SHx, VS/PE, dx testing, ED course and treatment: no indication for re-starting TB meds, OK to rx abx (doxy x7d) for possible acute infection on CT scan, ID office with f/u with pt. Dx and testing, as well as incidental finding(s) and d/w ID MD, d/w pt.  Questions answered.  Verb understanding, agreeable to d/c home with outpt f/u.     Results for orders placed or performed during the hospital encounter of 12/10/18  SARS Coronavirus 2 [facility] order, Performed in [facility] hospital lab) Nasopharyngeal Nasopharyngeal Swab   Specimen: Nasopharyngeal Swab  Result Value Ref Range   SARS Coronavirus 2 NEGATIVE NEGATIVE  Comprehensive metabolic panel  Result Value Ref Range   Sodium 135 135 - 145 mmol/L   Potassium 4.6 3.5 - 5.1 mmol/L   Chloride 104 98 - 111 mmol/L   CO2 26 22 - 32 mmol/L   Glucose, Bld 124 (H) 70 - 99 mg/dL   BUN 23 (H) 6 - 20 mg/dL   Creatinine, Ser 1.22 0.61 - 1.24 mg/dL   Calcium 9.0 8.9 - 10.3 mg/dL   Total Protein 7.5 6.5 - 8.1 g/dL   Albumin 4.0 3.5 - 5.0 g/dL   AST 31 15 - 41 U/L   ALT 24 0 - 44 U/L   Alkaline Phosphatase 59 38 - 126 U/L   Total Bilirubin 2.3 (H) 0.3 - 1.2 mg/dL   GFR calc non Af Amer >60 >60 mL/min   GFR calc Af Amer >60 >60 mL/min   Anion gap 5 5 - 15  CBC with Differential  Result Value Ref Range   WBC 2.1 (L) 4.0 - 10.5 K/uL   RBC 3.46 (L)  4.22 - 5.81 MIL/uL   Hemoglobin 11.7 (L) 13.0 - 17.0 g/dL   HCT 34.7 (L) 39.0 - 52.0 %   MCV 100.3 (H) 80.0 - 100.0 fL   MCH 33.8 26.0 - 34.0 pg   MCHC 33.7 30.0 - 36.0 g/dL   RDW 15.0 11.5 - 15.5 %   Platelets 19 (LL) 150 - 400 K/uL   nRBC 0.0 0.0 - 0.2 %   Neutrophils Relative % 41 %   Neutro Abs 0.9 (L) 1.7 - 7.7 K/uL   Lymphocytes Relative 39 %   Lymphs Abs 0.8 0.7 - 4.0 K/uL   Monocytes Relative 10 %   Monocytes Absolute 0.2 0.1 - 1.0 K/uL   Eosinophils Relative 8 %   Eosinophils Absolute 0.2 0.0 - 0.5 K/uL   Basophils Relative 1 %   Basophils Absolute 0.0 0.0 - 0.1 K/uL   Immature Granulocytes 1 %   Abs Immature Granulocytes 0.01 0.00 - 0.07 K/uL  Protime-INR  Result Value Ref Range   Prothrombin Time 16.0 (H) 11.4 - 15.2 seconds   INR 1.3 (H) 0.8 - 1.2  APTT  Result Value Ref Range   aPTT 42 (H) 24 - 36 seconds  Type and screen  Result Value Ref Range   ABO/RH(D) B POS    Antibody Screen NEG    Sample Expiration      12/13/2018,2359 Performed at [facility], [place] [address] [place], [place] [address]   Troponin I (High Sensitivity)  Result Value Ref Range   Troponin I (High Sensitivity) 2 <18 ng/L  Troponin I (High Sensitivity)  Result Value Ref Range   Troponin I (High Sensitivity) 2 <18 ng/L   Ct Angio Chest Pe W And/or Wo Contrast Result Date: 12/10/2018 CLINICAL DATA:  48 year old male with hemoptysis. History of diabetes and cirrhosis and TB. EXAM: CT ANGIOGRAPHY CHEST WITH CONTRAST TECHNIQUE: Multidetector CT imaging of the chest was performed using the standard protocol during bolus administration of intravenous contrast. Multiplanar CT image reconstructions and MIPs were obtained to evaluate the vascular anatomy. CONTRAST:  195m OMNIPAQUE IOHEXOL 350 MG/ML SOLN COMPARISON:  Chest radiograph dated 12/09/2018. FINDINGS: Cardiovascular: Top-normal cardiac size. No pericardial effusion. The thoracic aorta is unremarkable. No pulmonary artery  embolus identified. Mediastinum/Nodes: No hilar or mediastinal adenopathy. The esophagus is grossly unremarkable. No mediastinal fluid collection. Lungs/Pleura: Scattered bilateral calcified granulomas. Areas of linear and nodular scarring the apices as well as involving the left lower lobe and lingula. The left apical nodular scarring measures approximately 27 x 12 mm in axial dimensions. There is associated mild traction bronchiectasis in the left lower lobe. There is left lung base pleural thickening/scarring. Faint clusters of nodular density in the lingula may also be chronic although an acute infection is not excluded. Clinical correlation is recommended. There is no pleural effusion or pneumothorax. The central airways are patent. Upper Abdomen: Cirrhosis with evidence of portal hypertension and splenomegaly. Musculoskeletal: No chest wall abnormality. No acute or significant osseous findings. Review of the MIP images confirms the above findings. IMPRESSION: 1. No CT evidence of pulmonary embolism. 2. Areas of linear and nodular scarring involving the apices, left lower lobe, and lingula as well as left lung base pleural thickening/scarring. Direct comparison with prior CT images, if available, recommended to document stability. If no prior CT images are available, further evaluation with PET-CT following resolution of acute symptoms is recommended to exclude malignancy. 3. Faint clusters of nodular density in the lingula may be chronic although an acute infection is not excluded. Clinical correlation is recommended. 4. Cirrhosis with evidence of portal hypertension and splenomegaly. Electronically Signed   By: AAnner CreteM.D.   On: 12/10/2018 11:20   Dg Chest Port 1 View Result Date: 12/09/2018 CLINICAL DATA:  Cough EXAM: PORTABLE CHEST 1 VIEW COMPARISON:  05/01/2018, 07/11/2017 FINDINGS: Stable linear scarring at the left lung base. No acute consolidation or effusion. Stable cardiomediastinal  silhouette. No pneumothorax. Pleural thickening at the left apex. Stable parenchymal nodularity on the left. IMPRESSION: No active disease. Stable scarring at the left lung base. Prior granulomatous disease. Electronically Signed   By: KDonavan FoilM.D.   On: 12/09/2018 22:59      MFrancine Graven DO 12/10/18 1422

## 2018-12-10 NOTE — Progress Notes (Signed)
Subjective:    Patient ID: Jose Snow, male    DOB: 10/01/1970, 48 y.o.   MRN: [id number]  Chief Complaint  Patient presents with  . Hepatitis B  . Cirrhosis     HPI:  Jose Snow is a 48 y.o. 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 Jose Snow speaks [place] and medical interpreter is present to aid in communication.    Jose Snow was seen in the ED last night with concern for hemoptysis with CT imaging showing no evidence of pulmonary embolism and areas of linear and nodular scarring with concern for potential malignancy as well nodular density which may have been chronic; and cirrhosis with portal hypertension. There was some concern regarding tuberculosis as he was previously treated 18 years ago.   Jose Snow has been taking his Tenofovir until about 2 days ago when he ran out of medication. He has had no adverse side effects with the Tenofovir. He is feeling okay now. Denies any recent fevers, chills, night sweats, or sputum production. He has not traveled recently. Has been working with [name] and is getting into care with the [facility].    No Known Allergies    Outpatient Medications Prior to Visit  Medication Sig Dispense Refill  . bismuth-metronidazole-tetracycline (PYLERA) 140-125-125 MG capsule Take 3 capsules by mouth 4 (four) times daily -  before meals and at bedtime for 14 days. 168 capsule 0  . doxycycline (VIBRA-TABS) 100 MG tablet Take 1 tablet (100 mg total) by mouth 2 (two) times daily. 14 tablet 0  . insulin glargine (LANTUS) 100 UNIT/ML injection Inject 9 Units into the skin daily.    Marland Kitchen lactulose (CHRONULAC) 10 GM/15ML solution Take 45 mLs (30 g total) by mouth daily. 240 mL 3  . levocetirizine (XYZAL) 5 MG tablet TAKE 1 TABLET BY MOUTH EVERY EVENING (Patient not taking: No sig reported) 30  tablet 0  . linagliptin (TRADJENTA) 5 MG TABS tablet Take 1 tablet (5 mg total) by mouth daily. 90 tablet 1  . nadolol (CORGARD) 20 MG tablet Take 1 tablet (20 mg total) by mouth daily. (Patient not taking: Reported on 08/20/2018) 30 tablet 6  . omeprazole (PRILOSEC) 40 MG capsule Take 1 capsule (40 mg total) by mouth daily before breakfast. 30 capsule 6  . ondansetron (ZOFRAN) 4 MG tablet Take 1 tablet (4 mg total) by mouth every 8 (eight) hours as needed for nausea or vomiting. 10 tablet 0  . spironolactone (ALDACTONE) 100 MG tablet TAKE 1 TABLET BY MOUTH DAILY 90 tablet 3  . spironolactone (ALDACTONE) 50 MG tablet TAKE 1 TABLET BY MOUTH DAILY 30 tablet 11  . sucralfate (CARAFATE) 1 GM/10ML suspension Take 10 mLs (1 g total) by mouth 4 (four) times daily. Take between meals and at bedtime. (Patient not taking: Reported on 08/20/2018) 420 mL 3  . tenofovir (VIREAD) 300 MG tablet TAKE 1 TABLET BY MOUTH EVERY DAY 30 tablet 0  . sodium chloride (PF) 0.9 % injection      No facility-administered medications prior to visit.      Past Medical History:  Diagnosis Date  . Cirrhosis ([place])   . Diabetes mellitus ([place])   . Hepatitis B   . Liver cirrhosis secondary to NASH ([place])   . Lymphocytic colitis   . Pancytopenia ([place])   . 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      Past Surgical History:  Procedure Laterality Date  . BIOPSY  10/23/2017   Procedure: BIOPSY;  Surgeon: Milus Banister, MD;  Location: [facility];  Service: Endoscopy;;  . BIOPSY  03/03/2018   Procedure: BIOPSY;  Surgeon: Irving Copas., MD;  Location: [facility];  Service: Gastroenterology;;  . COLONOSCOPY WITH PROPOFOL N/A 10/23/2017   Procedure: COLONOSCOPY WITH PROPOFOL;  Surgeon: Milus Banister, MD;  Location: [facility];  Service: Endoscopy;  Laterality: N/A;  . COMPLEX WOUND CLOSURE Right 12/15/2015   Procedure: COMPLEX WOUND CLOSURE;  Surgeon: Leanora Cover, MD;  Location: [facility];  Service:  Orthopedics;  Laterality: Right;  . ESOPHAGOGASTRODUODENOSCOPY (EGD) WITH PROPOFOL N/A 10/23/2017   Procedure: ESOPHAGOGASTRODUODENOSCOPY (EGD) WITH PROPOFOL;  Surgeon: Milus Banister, MD;  Location: [facility];  Service: Endoscopy;  Laterality: N/A;  . ESOPHAGOGASTRODUODENOSCOPY (EGD) WITH PROPOFOL N/A 03/03/2018   Procedure: ESOPHAGOGASTRODUODENOSCOPY (EGD) WITH PROPOFOL;  Surgeon: Rush Landmark Telford Nab., MD;  Location: [facility];  Service: Gastroenterology;  Laterality: N/A;  . I&D EXTREMITY Right 12/15/2015   Procedure: IRRIGATION AND DEBRIDEMENT AND REVISION AMPUTATION RIGHT RING FINGER;  Surgeon: Leanora Cover, MD;  Location: [facility];  Service: Orthopedics;  Laterality: Right;       Review of Systems  Constitutional: Negative for chills, diaphoresis, fatigue and fever.  Respiratory: Negative for cough, chest tightness, shortness of breath and wheezing.   Cardiovascular: Negative for chest pain.  Gastrointestinal: Negative for abdominal distention, abdominal pain, constipation, diarrhea, nausea and vomiting.  Neurological: Negative for weakness and headaches.  Hematological: Does not bruise/bleed easily.      Objective:    BP (!) 109/43   Pulse 65   Temp 98.1 F (36.7 C)  Nursing note and vital signs reviewed.  Physical Exam Constitutional:      General: He is not in acute distress.    Appearance: He is well-developed.  Cardiovascular:     Rate and Rhythm: Normal rate and regular rhythm.     Heart sounds: Normal heart sounds. No murmur. No friction rub. No gallop.   Pulmonary:     Effort: Pulmonary effort is normal. No respiratory distress.     Breath sounds: Normal breath sounds. No wheezing or rales.  Chest:     Chest wall: No tenderness.  Abdominal:     General: Bowel sounds are normal. There is no distension.     Palpations: Abdomen is soft. There is no mass.     Tenderness: There is no abdominal tenderness. There is no guarding or rebound.  Skin:    General:  Skin is warm and dry.  Neurological:     Mental Status: He is alert and oriented to person, place, and time.  Psychiatric:        Behavior: Behavior normal.        Thought Content: Thought content normal.        Judgment: Judgment normal.     Depression screen [facility] 2/9 08/20/2018 05/14/2018 05/01/2018 02/27/2018 12/29/2017  Decreased Interest 0 0 0 0 0  Down, Depressed, Hopeless 0 0 0 0 0  PHQ - 2 Score 0 0 0 0 0       Assessment & Plan:    Patient Active Problem List   Diagnosis Date Noted  . Hepatitis B 02/17/2015    Priority: High  . Cirrhosis of liver due to hepatitis B ([place]) 11/25/2014    Priority: High  . Malnutrition of moderate degree 03/03/2018  .  Epigastric pain 03/02/2018  . Pancytopenia ([place]) 12/03/2017  . Hyperglycemia 12/03/2017  . Cramps, extremity 12/03/2017  . Abnormal echocardiogram 12/03/2017  . New onset type 2 diabetes mellitus ([place]) 12/03/2017  . History of hepatitis B 12/03/2017  . Diarrhea   . Esophageal varices without bleeding ([place])   . Heart murmur 08/01/2017  . Elevated bilirubin   . Thrombocytopenia ([place]) 11/25/2014  . History of TB (tuberculosis) 07/16/2010     Problem List Items Addressed This Visit      Digestive   Cirrhosis of liver due to hepatitis B [facility])    Mr. Dowding appears to have decompensated liver cirrhosis without ascites. Concern for hemoptysis related to cirrhosis as opposed to respiratory origin given his thrombocytopenia. Recommend continued follow up with [name] and [facility]. We discussed the importance of staying on his Tenofovir.       Hepatitis B - Primary    Mr. Eisen has chronic Hepatitis C complicated by cirrhosis without ascites. He appears to be decompensated at present with most recent platelet count of 19. He will need to continue his tenofovir indefinitely given his cirrhosis. Recommend close follow up with [name] and [facility] for continued intervention as appropriate.       Relevant Medications   tenofovir (VIREAD) 300 MG tablet       I have changed Y K. Crissman's tenofovir. I am also having him maintain his levocetirizine, insulin glargine, bismuth-metronidazole-tetracycline, omeprazole, nadolol, linagliptin, lactulose, sucralfate, ondansetron, spironolactone, spironolactone, and doxycycline.   Meds ordered this encounter  Medications  . tenofovir (VIREAD) 300 MG tablet    Sig: Take 1 tablet (300 mg total) by mouth daily.    Dispense:  30 tablet    Refill:  5    Order Specific Question:   Supervising Provider    Answer:   Carlyle Basques [[id number]]     Follow-up: Return in about 4 months (around 04/11/2019).   Terri Piedra, MSN, FNP-C Nurse Practitioner [facility] [place] number: [phone]

## 2018-12-10 NOTE — ED Provider Notes (Signed)
TIME SEEN: 6:29 AM  CHIEF COMPLAINT: Hemoptysis  HPI: Patient is a 48 year old male with history of hepatitis B, cirrhosis, previous tuberculosis in 2002, non-insulin-dependent diabetes who presents to the emergency department with complaints of cough with bloody sputum for the past day.  States that he is coughing up small clots of blood.  He reports intermittent abdominal pain and intermittent chest pain that has been going on for 3 years.  No abdominal pain currently but is having some chest discomfort especially with coughing.  No shortness of breath.  He denies that he has vomiting.  No bloody stools or melena.  He is not on any blood thinners.  He denies history of PE or DVT.  States he was completely treated for tuberculosis in 2002.  It appears he received [facility] months of treatment at the health department.  He has not traveled outside of the country since 2017.  No calf swelling or tenderness.  No history of CAD.  ROS: See HPI Constitutional: no fever  Eyes: no drainage  ENT: no runny nose   Cardiovascular:   chest pain  Resp:  SOB  GI: no vomiting GU: no dysuria Integumentary: no rash  Allergy: no hives  Musculoskeletal: no leg swelling  Neurological: no slurred speech ROS otherwise negative  PAST MEDICAL HISTORY/PAST SURGICAL HISTORY:  Past Medical History:  Diagnosis Date  . Cirrhosis ([place])   . Diabetes mellitus ([place])   . Hepatitis B   . Lymphocytic colitis   . 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     MEDICATIONS:  Prior to Admission medications   Medication Sig Start Date End Date Taking? Authorizing Provider  bismuth-metronidazole-tetracycline (PYLERA) [phone] MG capsule Take 3 capsules by mouth 4 (four) times daily -  before meals and at bedtime for 14 days. 03/11/18 03/25/18  Milus Banister, MD  insulin glargine (LANTUS) 100 UNIT/ML injection Inject 9 Units into the skin daily.    [provider]  lactulose (CHRONULAC) 10 GM/15ML  solution Take 45 mLs (30 g total) by mouth daily. 05/14/18   Horald Pollen, MD  levocetirizine (XYZAL) 5 MG tablet TAKE 1 TABLET BY MOUTH EVERY EVENING Patient not taking: No sig reported 02/09/18   Rutherford Guys, MD  linagliptin (TRADJENTA) 5 MG TABS tablet Take 1 tablet (5 mg total) by mouth daily. 05/14/18 08/12/18  Horald Pollen, MD  nadolol (CORGARD) 20 MG tablet Take 1 tablet (20 mg total) by mouth daily. Patient not taking: Reported on 08/20/2018 04/17/18   Milus Banister, MD  omeprazole (PRILOSEC) 40 MG capsule Take 1 capsule (40 mg total) by mouth daily before breakfast. 04/17/18   Milus Banister, MD  ondansetron [facility]) 4 MG tablet Take 1 tablet (4 mg total) by mouth every 8 (eight) hours as needed for nausea or vomiting. 08/20/18   Horald Pollen, MD  spironolactone (ALDACTONE) 100 MG tablet TAKE 1 TABLET BY MOUTH DAILY 11/06/18   Milus Banister, MD  spironolactone (ALDACTONE) 50 MG tablet TAKE 1 TABLET BY MOUTH DAILY 10/30/18   Milus Banister, MD  sucralfate (CARAFATE) 1 GM/10ML suspension Take 10 mLs (1 g total) by mouth 4 (four) times daily. Take between meals and at bedtime. Patient not taking: Reported on 08/20/2018 05/14/18   Horald Pollen, MD  tenofovir (VIREAD) 300 MG tablet TAKE 1 TABLET BY MOUTH EVERY DAY 11/02/18   [place], FNP    ALLERGIES:  No Known Allergies  SOCIAL  HISTORY:  Social History   Tobacco Use  . Smoking status: Former Smoker    Packs/day: 0.50    Years: 8.00    Pack years: 4.00    Types: Cigarettes    Quit date: 04/08/1990    Years since quitting: 28.6  . Smokeless tobacco: Never Used  Substance Use Topics  . Alcohol use: No    FAMILY HISTORY: Family History  Problem Relation Age of Onset  . Colon cancer Neg Hx   . Esophageal cancer Neg Hx   . Pancreatic cancer Neg Hx   . Stomach cancer Neg Hx   . Liver disease Neg Hx     EXAM: BP 112/66 (BP Location: Right Arm)   Pulse (!) 53   Temp 98 F (36.7 C)  (Oral)   Resp 18   SpO2 97%  CONSTITUTIONAL: Alert and oriented and responds appropriately to questions. Well-appearing; well-nourished HEAD: Normocephalic EYES: Conjunctivae clear, pupils appear equal, EOMI ENT: normal nose; moist mucous membranes NECK: Supple, no meningismus, no nuchal rigidity, no LAD  CARD: RRR; S1 and S2 appreciated; no murmurs, no clicks, no rubs, no gallops RESP: Normal chest excursion without splinting or tachypnea; breath sounds clear and equal bilaterally; no wheezes, no rhonchi, no rales, no hypoxia or respiratory distress, speaking full sentences, intermittent dry cough ABD/GI: Normal bowel sounds; non-distended; soft, non-tender, no rebound, no guarding, no peritoneal signs, no hepatosplenomegaly BACK:  The back appears normal and is non-tender to palpation, there is no CVA tenderness EXT: Normal ROM in all joints; non-tender to palpation; no edema; normal capillary refill; no cyanosis, no calf tenderness or swelling    SKIN: Normal color for age and race; warm; no rash NEURO: Moves all extremities equally PSYCH: The patient's mood and manner are appropriate. Grooming and personal hygiene are appropriate.  MEDICAL DECISION MAKING: Patient here with hemoptysis.  Has previously had TB in 2002 after he moved to the [place].  No travel outside of the country since 2017.  This could be reactivation of previous TB infection.  He denies at this is vomiting blood.  His abdominal exam here is benign.  Will obtain CTA of his chest for further evaluation and to evaluate for possible PE, infiltrate, alveolar hemorrhage.  His chest x-ray is clear.  He has no respiratory distress or hypoxia.  Labs pending.  Will send QuantiFERON as well as AFB sputum cultures.  Will obtain HIV testing as well.  ED PROGRESS: EKG shows no ischemic abnormality.  Signed out to oncoming EDP to follow up on labs and CT.  I reviewed all nursing notes, vitals, pertinent previous records, EKGs,  lab and urine results, imaging (as available).      EKG Interpretation  Date/Time:  Thursday December 10 2018 06:46:35 EDT Ventricular Rate:  52 PR Interval:    QRS Duration: 100 QT Interval:  495 QTC Calculation: 461 R Axis:   86 Text Interpretation:  Sinus rhythm No significant change since last tracing Confirmed by Aayden Cefalu, Cyril Mourning [phone]) on 12/10/2018 7:11:24 AM         Refael Fulop, Delice Bison, DO 12/10/18 [id number]

## 2018-12-10 NOTE — ED Notes (Signed)
Patient given specimen cup for sputum collection.   Urinal at bedside.

## 2018-12-10 NOTE — Patient Instructions (Signed)
Nice to see you.  Refills of your tenofovir have been sent to the pharmacy.  Please continue to take the Tenofovir as prescribed.   Please schedule an appointment with [name] to follow up for your cirrhosis:  [name] office - [phone]  Plan to follow up in 4 months or sooner if needed.   Have a great day and stay safe.

## 2018-12-10 NOTE — ED Notes (Signed)
PATIENT O2 STATS REMAINED 100 % WHILE AMBULATING AROUND ED FLOOR

## 2018-12-10 NOTE — ED Notes (Signed)
Patient transported to CT 

## 2018-12-11 LAB — ACID FAST SMEAR (AFB, MYCOBACTERIA): Acid Fast Smear: NEGATIVE

## 2018-12-11 NOTE — Assessment & Plan Note (Signed)
Jose Snow has chronic Hepatitis C complicated by cirrhosis without ascites. He appears to be decompensated at present with most recent platelet count of 19. He will need to continue his tenofovir indefinitely given his cirrhosis. Recommend close follow up with [name] and [facility] for continued intervention as appropriate.

## 2018-12-11 NOTE — Assessment & Plan Note (Signed)
Jose Snow appears to have decompensated liver cirrhosis without ascites. Concern for hemoptysis related to cirrhosis as opposed to respiratory origin given his thrombocytopenia. Recommend continued follow up with [name] and [facility]. We discussed the importance of staying on his Tenofovir.

## 2018-12-12 LAB — QUANTIFERON-TB GOLD PLUS (RQFGPL)
QuantiFERON Mitogen Value: 0.65 IU/mL
QuantiFERON Nil Value: 0.02 IU/mL
QuantiFERON TB1 Ag Value: 1.14 IU/mL
QuantiFERON TB2 Ag Value: 0.94 IU/mL

## 2018-12-12 LAB — QUANTIFERON-TB GOLD PLUS: QuantiFERON-TB Gold Plus: POSITIVE — AB

## 2018-12-16 ENCOUNTER — Ambulatory Visit: Payer: 59 | Admitting: Family

## 2018-12-17 ENCOUNTER — Encounter: Payer: Self-pay | Admitting: Emergency Medicine

## 2018-12-17 ENCOUNTER — Telehealth: Payer: Self-pay | Admitting: Emergency Medicine

## 2018-12-17 NOTE — Progress Notes (Signed)
Call patient.  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  Must follow-up with his infectious disease doctor he is already seeing.  Thanks.

## 2018-12-17 NOTE — Telephone Encounter (Signed)
Copied from [place] [id number]. Topic: General - Other >> Dec 17, 2018  3:52 PM Keene Breath wrote: Reason for CRM: Patient's caregiver, [name], called to ask if the doctor could refill a script for the patient's Metformin.  This medication was not on his list and she was also going to check to see if it was prescribed by his diabetes doctor.  Please call to discuss to let the patient know.  CB# [phone]

## 2018-12-21 ENCOUNTER — Other Ambulatory Visit: Payer: Self-pay

## 2018-12-21 ENCOUNTER — Encounter: Payer: Self-pay | Admitting: Emergency Medicine

## 2018-12-21 ENCOUNTER — Ambulatory Visit: Payer: 59 | Admitting: Emergency Medicine

## 2018-12-21 VITALS — BP 105/56 | HR 67 | Temp 98.5°F | Resp 16 | Ht 63.25 in | Wt 123.4 lb

## 2018-12-21 DIAGNOSIS — Z23 Encounter for immunization: Secondary | ICD-10-CM | POA: Diagnosis not present

## 2018-12-21 DIAGNOSIS — K7581 Nonalcoholic steatohepatitis (NASH): Secondary | ICD-10-CM

## 2018-12-21 DIAGNOSIS — E1169 Type 2 diabetes mellitus with other specified complication: Secondary | ICD-10-CM | POA: Diagnosis not present

## 2018-12-21 DIAGNOSIS — D61818 Other pancytopenia: Secondary | ICD-10-CM | POA: Diagnosis not present

## 2018-12-21 DIAGNOSIS — K7460 Unspecified cirrhosis of liver: Secondary | ICD-10-CM

## 2018-12-21 MED ORDER — OMEPRAZOLE 40 MG PO CPDR: 40.0000 mg | DELAYED_RELEASE_CAPSULE | Freq: Every day | ORAL | 6 refills | Status: DC

## 2018-12-21 NOTE — Patient Instructions (Addendum)
If you have lab work done today you will be contacted with your lab results within the next 2 weeks.  If you have not heard from [place] then please contact us. The fastest way to get your results is to register for My Chart.   IF you received an x-ray today, you will receive an invoice from [facility]. Please contact [facility] at [phone] with questions or concerns regarding your invoice.   IF you received labwork today, you will receive an invoice from [place]. Please contact [facility] at [phone] with questions or concerns regarding your invoice.   Our billing staff will not be able to assist you with questions regarding bills from these companies.  You will be contacted with the lab results as soon as they are available. The fastest way to get your results is to activate your My Chart account. Instructions are located on the last page of this paperwork. If you have not heard from [place] regarding the results in 2 weeks, please contact this office.     Cirrhosis  Cirrhosis is long-term (chronic) liver injury. The liver is the body's largest internal organ, and it performs many functions. It converts food into energy, removes toxic material from the blood, makes important proteins, and absorbs necessary vitamins from food. In cirrhosis, healthy liver cells are replaced by scar tissue. This prevents blood from flowing through the liver, making it difficult for the liver to function. Scarring of the liver cannot be reversed, but treatment can prevent it from getting worse. What are the causes? Common causes of this condition are hepatitis C and long-term alcohol abuse. Other causes include:  Nonalcoholic fatty liver disease. This happens when fat is deposited in the liver by causes other than alcohol.  Hepatitis B infection.  Autoimmune hepatitis. In this condition, the body's defense system (immune system) mistakenly attacks the liver cells, causing irritation and  swelling (inflammation).  Diseases that cause blockage of ducts inside the liver.  Inherited liver diseases, such as hemochromatosis. This is one of the most common inherited liver diseases. In this disease, deposits of iron collect in the liver and other organs.  Reactions to certain long-term medicines, such as amiodarone, a heart medicine.  Parasitic infections. These include schistosomiasis, which is caused by a flatworm.  Long-term contact to certain toxins. These toxins include certain organic solvents, such as toluene and chloroform. What increases the risk? You are more likely to develop this condition if:  You have certain types of viral hepatitis.  You abuse alcohol, especially if you are male.  You are overweight.  You share needles.  You have unprotected sex with someone who has viral hepatitis. What are the signs or symptoms? You may not have any signs and symptoms at first. Symptoms may not develop until the damage to your liver starts to get worse. Early symptoms may include:  Weakness and tiredness (fatigue).  Changes in sleep patterns or having trouble sleeping.  Itchiness.  Tenderness in the right-upper part of your abdomen.  Weight loss and muscle loss.  Nausea.  Loss of appetite.  Appearance of tiny blood vessels under the skin. Later symptoms may include:  Fatigue or weakness that is getting worse.  Yellow skin and eyes (jaundice).  Buildup of fluid in the abdomen (ascites). You may notice that your clothes are tight around your waist.  Weight gain.  Swelling of the feet and ankles (edema).  Trouble breathing.  Easy bruising and bleeding.  Vomiting blood.  Black  or bloody stool.  Mental confusion. How is this diagnosed? Your health care provider may suspect cirrhosis based on your symptoms and medical history, especially if you have other medical conditions or a history of alcohol abuse. Your health care provider will do a  physical exam to feel your liver and to check for signs of cirrhosis. He or she may perform other tests, including:  Blood tests to check: ? For hepatitis B or C. ? Kidney function. ? Liver function.  Imaging tests such as: ? MRI or CT scan to look for changes seen in advanced cirrhosis. ? Ultrasound to see if normal liver tissue is being replaced by scar tissue.  A procedure in which a long needle is used to take a sample of liver tissue to be checked in a lab (biopsy). Liver biopsy can confirm the diagnosis of cirrhosis. How is this treated? Treatment for this condition depends on how damaged your liver is and what caused the damage. It may include treating the symptoms of cirrhosis, or treating the underlying causes in order to slow the damage. Treatment may include:  Making lifestyle changes, such as: ? Eating a healthy diet. You may need to work with your health care provider or a diet and nutrition specialist (dietitian) to develop an eating plan. ? Restricting salt intake. ? Maintaining a healthy weight. ? Not abusing drugs or alcohol.  Taking medicines to: ? Treat liver infections or other infections. ? Control itching. ? Reduce fluid buildup. ? Reduce certain blood toxins. ? Reduce risk of bleeding from enlarged blood vessels in the stomach or esophagus (varices).  Liver transplant. In this procedure, a liver from a donor is used to replace your diseased liver. This is done if cirrhosis has caused liver failure. Other treatments and procedures may be done depending on the problems that you get from cirrhosis. Common problems include liver-related kidney failure (hepatorenal syndrome). Follow these instructions at home:   Take medicines only as told by your health care provider. Do not use medicines that are toxic to your liver. Ask your health care provider before taking any new medicines, including over-the-counter medicines.  Rest as needed.  Eat a well-balanced diet.  Ask your health care provider or dietitian for more information.  Limit your salt or water intake, if your health care provider asks you to do this.  Do not drink alcohol. This is especially important if you are taking acetaminophen.  Keep all follow-up visits as told by your health care provider. This is important. Contact a health care provider if you:  Have fatigue or weakness that is getting worse.  Develop swelling of the hands, feet, legs, or face.  Have a fever.  Develop loss of appetite.  Have nausea or vomiting.  Develop jaundice.  Develop easy bruising or bleeding. Get help right away if you:  Vomit bright red blood or a material that looks like coffee grounds.  Have blood in your stools.  Notice that your stools appear black and tarry.  Become confused.  Have chest pain or trouble breathing. Summary  Cirrhosis is chronic liver injury. Liver damage cannot be reversed. Common causes are hepatitis C and long-term alcohol abuse.  Tests used to diagnose cirrhosis include blood tests, imaging tests, and liver biopsy.  Treatment for this condition involves treating the underlying cause. Avoid alcohol, drugs, salt, and medicines that may damage your liver.  Contact your health care provider if you develop ascites, edema, jaundice, fever, nausea or vomiting, easy bruising or  bleeding, or worsening fatigue. This information is not intended to replace advice given to you by your health care provider. Make sure you discuss any questions you have with your health care provider. Document Released: 03/25/2005 Document Revised: 07/15/2018 Document Reviewed: 02/12/2017 [employer]vier Patient Education  2020 [employer].

## 2018-12-21 NOTE — Progress Notes (Signed)
ED Provider Notes Jose Graven, DO (Physician) . [facility]   Pt received at sign out with CTA pending. See previous EDP note for full HPI/H&P/MDM. 48yo M, c/o flecks of blood in cough. No vomiting blood, no melena. BP per baseline. Pt has ambulated with steady gait, easy resps, Sats 100% R/A. Labs per baseline. CT without PE, question possible acute infection. TB and HIV testing pending. Epic chart reviewed: apparently pt's family has been calling ID MD office for the past 2 months. No clear indication for admission at this time. Pt states he has f/u scheduled with his PMD already. 1405:  T/C returned from ID Dr. Megan Salon, case discussed, including:  HPI, pertinent PM/SHx, VS/PE, dx testing, ED course and treatment: no indication for re-starting TB meds, OK to rx abx (doxy x7d) for possible acute infection on CT scan, ID office with f/u with pt. Dx and testing, as well as incidental finding(s) and d/w ID MD, d/w pt.  Questions answered.  Verb understanding, agreeable to d/c home with outpt f/u.     Jolene Provost Legendre 48 y.o.   Chief Complaint  Patient presents with  . Medication Refill    doxycycline, levocetirizine and spironolactone  . Cough    productive with blood patient went to the ED 12/10/2018    HISTORY OF PRESENT ILLNESS: This is a 48 y.o. male with history of diabetes and liver cirrhosis went to the emergency room on 12/10/2018 with hemoptysis.  Here for follow-up.  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  Patient was notified and referred to infectious diseases. Here with interpreter.  Also requesting medication refills. Overall doing well.  Today he has no complaints or medical concerns. Has history of tuberculosis in the past. Medications reviewed with patient.  Does not need refills. HPI   Prior to Admission medications   Medication Sig Start Date End Date Taking? Authorizing Provider  insulin glargine (LANTUS) 100 UNIT/ML injection Inject 9 Units into the skin daily.   Yes  [provider]  levocetirizine (XYZAL) 5 MG tablet TAKE 1 TABLET BY MOUTH EVERY EVENING 02/09/18  Yes Rutherford Guys, MD  nadolol (CORGARD) 20 MG tablet Take 1 tablet (20 mg total) by mouth daily. 04/17/18  Yes Milus Banister, MD  omeprazole (PRILOSEC) 40 MG capsule Take 1 capsule (40 mg total) by mouth daily before breakfast. 04/17/18  Yes Milus Banister, MD  spironolactone (ALDACTONE) 100 MG tablet TAKE 1 TABLET BY MOUTH DAILY 11/06/18  Yes Milus Banister, MD  spironolactone (ALDACTONE) 50 MG tablet TAKE 1 TABLET BY MOUTH DAILY 10/30/18  Yes Milus Banister, MD  tenofovir (VIREAD) 300 MG tablet Take 1 tablet (300 mg total) by mouth daily. 12/10/18  Yes Golden Circle, FNP  bismuth-metronidazole-tetracycline [facility]) [phone] MG capsule Take 3 capsules by mouth 4 (four) times daily -  before meals and at bedtime for 14 days. 03/11/18 03/25/18  Milus Banister, MD  doxycycline (VIBRA-TABS) 100 MG tablet Take 1 tablet (100 mg total) by mouth 2 (two) times daily. Patient not taking: Reported on 12/21/2018 12/10/18   Jose Graven, DO  lactulose (CHRONULAC) 10 GM/15ML solution Take 45 mLs (30 g total) by mouth daily. Patient not taking: Reported on 12/21/2018 05/14/18   Horald Pollen, MD  linagliptin (TRADJENTA) 5 MG TABS tablet Take 1 tablet (5 mg total) by mouth daily. 05/14/18 08/12/18  Horald Pollen, MD  ondansetron (ZOFRAN) 4 MG tablet Take 1 tablet (4 mg total) by mouth every 8 (eight)  hours as needed for nausea or vomiting. Patient not taking: Reported on 12/21/2018 08/20/18   Horald Pollen, MD  sucralfate (CARAFATE) 1 GM/10ML suspension Take 10 mLs (1 g total) by mouth 4 (four) times daily. Take between meals and at bedtime. Patient not taking: Reported on 08/20/2018 05/14/18   Horald Pollen, MD    No Known Allergies  Patient Active Problem List   Diagnosis Date Noted  . Pancytopenia ([place]) 12/03/2017  . Abnormal echocardiogram 12/03/2017  . New  onset type 2 diabetes mellitus ([place]) 12/03/2017  . History of hepatitis B 12/03/2017  . Esophageal varices without bleeding ([place])   . Heart murmur 08/01/2017  . Hepatitis B 02/17/2015  . 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 Thrombocytopenia ([place]) 11/25/2014  . History of TB (tuberculosis) 07/16/2010    Past Medical History:  Diagnosis Date  . Cirrhosis ([place])   . Diabetes mellitus ([place])   . Hepatitis B   . Liver cirrhosis secondary to NASH ([place])   . Lymphocytic colitis   . Pancytopenia ([place])   . 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     Past Surgical History:  Procedure Laterality Date  . BIOPSY  10/23/2017   Procedure: BIOPSY;  Surgeon: Milus Banister, MD;  Location: [facility];  Service: Endoscopy;;  . BIOPSY  03/03/2018   Procedure: BIOPSY;  Surgeon: Irving Copas., MD;  Location: [place] [facility];  Service: Gastroenterology;;  . COLONOSCOPY WITH PROPOFOL N/A 10/23/2017   Procedure: COLONOSCOPY WITH PROPOFOL;  Surgeon: Milus Banister, MD;  Location: [facility];  Service: Endoscopy;  Laterality: N/A;  . COMPLEX WOUND CLOSURE Right 12/15/2015   Procedure: COMPLEX WOUND CLOSURE;  Surgeon: Leanora Cover, MD;  Location: [facility];  Service: Orthopedics;  Laterality: Right;  . ESOPHAGOGASTRODUODENOSCOPY (EGD) WITH PROPOFOL N/A 10/23/2017   Procedure: ESOPHAGOGASTRODUODENOSCOPY (EGD) WITH PROPOFOL;  Surgeon: Milus Banister, MD;  Location: [facility];  Service: Endoscopy;  Laterality: N/A;  . ESOPHAGOGASTRODUODENOSCOPY (EGD) WITH PROPOFOL N/A 03/03/2018   Procedure: ESOPHAGOGASTRODUODENOSCOPY (EGD) WITH PROPOFOL;  Surgeon: Rush Landmark Telford Nab., MD;  Location: [facility];  Service: Gastroenterology;  Laterality: N/A;  . I&D EXTREMITY Right 12/15/2015   Procedure: IRRIGATION AND DEBRIDEMENT AND REVISION AMPUTATION RIGHT RING FINGER;  Surgeon: Leanora Cover, MD;  Location: [facility];  Service: Orthopedics;  Laterality: Right;    Social History    Socioeconomic History  . Marital status: Married    Spouse name: Not on file  . Number of children: 4  . Years of education: Not on file  . Highest education level: Not on file  Occupational History  . 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 Financial resource strain: Not on file  . Food insecurity    Worry: Not on file    Inability: Not on file  . Transportation needs    Medical: Not on file    Non-medical: Not on file  Tobacco Use  . Smoking status: Former Smoker    Packs/day: 0.50    Years: 8.00    Pack years: 4.00    Types: Cigarettes    Quit date: 04/08/1990    Years since quitting: 28.7  . Smokeless tobacco: Never Used  Substance and Sexual Activity  . Alcohol use: No  . Drug use: No  . Sexual activity: Yes  Lifestyle  . Physical activity    Days per week: Not on file  Minutes per session: Not on file  . Stress: Not on file  Relationships  . Social Herbalist on phone: Not on file    Gets together: Not on file    Attends religious service: Not on file    Active member of club or organization: Not on file    Attends meetings of clubs or organizations: Not on file    Relationship status: Not on file  . Intimate partner violence    Fear of current or ex partner: Not on file    Emotionally abused: Not on file    Physically abused: Not on file    Forced sexual activity: Not on file  Other Topics Concern  . 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  Lives with wife and four children.     Family History  Problem Relation Age of Onset  . Colon cancer Neg Hx   . Esophageal cancer Neg Hx   . Pancreatic cancer Neg Hx   . Stomach cancer Neg Hx   . Liver disease Neg Hx      Review of Systems  Constitutional: Negative.  Negative for chills and fever.  HENT: Negative.  Negative for congestion and sore throat.   Respiratory: Negative.   Cardiovascular: Negative.  Negative for chest pain and  palpitations.  Gastrointestinal: Negative.  Negative for abdominal pain, nausea and vomiting.  Skin: Negative.  Negative for rash.  Neurological: Negative for dizziness and headaches.    Vitals:   12/21/18 1509  BP: (!) 105/56  Pulse: 67  Resp: 16  Temp: 98.5 F (36.9 C)  SpO2: 98%    Physical Exam Vitals signs reviewed.  Constitutional:      Appearance: Normal appearance.  HENT:     Head: Normocephalic.     Mouth/Throat:     Mouth: Mucous membranes are moist.     Pharynx: Oropharynx is clear.  Eyes:     Extraocular Movements: Extraocular movements intact.     Pupils: Pupils are equal, round, and reactive to light.  Neck:     Musculoskeletal: Normal range of motion and neck supple. No muscular tenderness.  Cardiovascular:     Rate and Rhythm: Normal rate and regular rhythm.     Pulses: Normal pulses.     Heart sounds: Normal heart sounds.  Pulmonary:     Effort: Pulmonary effort is normal.     Breath sounds: Normal breath sounds.  Abdominal:     General: There is no distension.     Palpations: Abdomen is soft.     Tenderness: There is no abdominal tenderness.  Musculoskeletal: Normal range of motion.     Right lower leg: No edema.     Left lower leg: No edema.  Lymphadenopathy:     Cervical: No cervical adenopathy.  Skin:    General: Skin is warm and dry.     Capillary Refill: Capillary refill takes less than 2 seconds.     Findings: No rash.  Neurological:     General: No focal deficit present.     Mental Status: He is alert and oriented to person, place, and time.  Psychiatric:        Mood and Affect: Mood normal.        Behavior: Behavior normal.      ASSESSMENT & PLAN: Clinically stable.  No medical concerns identified during this visit. Stable chronic medical conditions. Continue present medications. Also follow-up with GI doctor and endocrinologist.  Y was seen today for medication refill and cough.  Diagnoses and all orders for this visit:   Type 2 diabetes mellitus with other specified complication, without long-term current use of insulin (HCC)  Liver cirrhosis secondary to NASH (nonalcoholic steatohepatitis) ([place])  Pancytopenia ([place])  Need for prophylactic vaccination and inoculation against influenza -     Flu Vaccine QUAD 36+ mos IM    Patient Instructions       If you have lab work done today you will be contacted with your lab results within the next 2 weeks.  If you have not heard from [place] then please contact us. The fastest way to get your results is to register for My Chart.   IF you received an x-ray today, you will receive an invoice from [facility]. Please contact [facility] at [phone] with questions or concerns regarding your invoice.   IF you received labwork today, you will receive an invoice from [facility]. Please contact [facility] at [phone] with questions or concerns regarding your invoice.   Our billing staff will not be able to assist you with questions regarding bills from these companies.  You will be contacted with the lab results as soon as they are available. The fastest way to get your results is to activate your My Chart account. Instructions are located on the last page of this paperwork. If you have not heard from [place] regarding the results in 2 weeks, please contact this office.     Cirrhosis  Cirrhosis is long-term (chronic) liver injury. The liver is the body's largest internal organ, and it performs many functions. It converts food into energy, removes toxic material from the blood, makes important proteins, and absorbs necessary vitamins from food. In cirrhosis, healthy liver cells are replaced by scar tissue. This prevents blood from flowing through the liver, making it difficult for the liver to function. Scarring of the liver cannot be reversed, but treatment can prevent it from getting worse. What are the causes? Common causes of this condition are  hepatitis C and long-term alcohol abuse. Other causes include:  Nonalcoholic fatty liver disease. This happens when fat is deposited in the liver by causes other than alcohol.  Hepatitis B infection.  Autoimmune hepatitis. In this condition, the body's defense system (immune system) mistakenly attacks the liver cells, causing irritation and swelling (inflammation).  Diseases that cause blockage of ducts inside the liver.  Inherited liver diseases, such as hemochromatosis. This is one of the most common inherited liver diseases. In this disease, deposits of iron collect in the liver and other organs.  Reactions to certain long-term medicines, such as amiodarone, a heart medicine.  Parasitic infections. These include schistosomiasis, which is caused by a flatworm.  Long-term contact to certain toxins. These toxins include certain organic solvents, such as toluene and chloroform. What increases the risk? You are more likely to develop this condition if:  You have certain types of viral hepatitis.  You abuse alcohol, especially if you are male.  You are overweight.  You share needles.  You have unprotected sex with someone who has viral hepatitis. What are the signs or symptoms? You may not have any signs and symptoms at first. Symptoms may not develop until the damage to your liver starts to get worse. Early symptoms may include:  Weakness and tiredness (fatigue).  Changes in sleep patterns or having trouble sleeping.  Itchiness.  Tenderness in the right-upper part of your abdomen.  Weight loss and muscle loss.  Nausea.  Loss of appetite.  Appearance of tiny blood vessels under the skin. Later symptoms may include:  Fatigue or weakness that is getting worse.  Yellow skin and eyes (jaundice).  Buildup of fluid in the abdomen (ascites). You may notice that your clothes are tight around your waist.  Weight gain.  Swelling of the feet and ankles (edema).   Trouble breathing.  Easy bruising and bleeding.  Vomiting blood.  Black or bloody stool.  Mental confusion. How is this diagnosed? Your health care provider may suspect cirrhosis based on your symptoms and medical history, especially if you have other medical conditions or a history of alcohol abuse. Your health care provider will do a physical exam to feel your liver and to check for signs of cirrhosis. He or she may perform other tests, including:  Blood tests to check: ? For hepatitis B or C. ? Kidney function. ? Liver function.  Imaging tests such as: ? MRI or CT scan to look for changes seen in advanced cirrhosis. ? Ultrasound to see if normal liver tissue is being replaced by scar tissue.  A procedure in which a long needle is used to take a sample of liver tissue to be checked in a lab (biopsy). Liver biopsy can confirm the diagnosis of cirrhosis. How is this treated? Treatment for this condition depends on how damaged your liver is and what caused the damage. It may include treating the symptoms of cirrhosis, or treating the underlying causes in order to slow the damage. Treatment may include:  Making lifestyle changes, such as: ? Eating a healthy diet. You may need to work with your health care provider or a diet and nutrition specialist (dietitian) to develop an eating plan. ? Restricting salt intake. ? Maintaining a healthy weight. ? Not abusing drugs or alcohol.  Taking medicines to: ? Treat liver infections or other infections. ? Control itching. ? Reduce fluid buildup. ? Reduce certain blood toxins. ? Reduce risk of bleeding from enlarged blood vessels in the stomach or esophagus (varices).  Liver transplant. In this procedure, a liver from a donor is used to replace your diseased liver. This is done if cirrhosis has caused liver failure. Other treatments and procedures may be done depending on the problems that you get from cirrhosis. Common problems include  liver-related kidney failure (hepatorenal syndrome). Follow these instructions at home:   Take medicines only as told by your health care provider. Do not use medicines that are toxic to your liver. Ask your health care provider before taking any new medicines, including over-the-counter medicines.  Rest as needed.  Eat a well-balanced diet. Ask your health care provider or dietitian for more information.  Limit your salt or water intake, if your health care provider asks you to do this.  Do not drink alcohol. This is especially important if you are taking acetaminophen.  Keep all follow-up visits as told by your health care provider. This is important. Contact a health care provider if you:  Have fatigue or weakness that is getting worse.  Develop swelling of the hands, feet, legs, or face.  Have a fever.  Develop loss of appetite.  Have nausea or vomiting.  Develop jaundice.  Develop easy bruising or bleeding. Get help right away if you:  Vomit bright red blood or a material that looks like coffee grounds.  Have blood in your stools.  Notice that your stools appear black and tarry.  Become confused.  Have chest pain or trouble breathing. Summary  Cirrhosis is chronic liver injury. Liver damage cannot be reversed. Common causes are hepatitis C and long-term alcohol abuse.  Tests used to diagnose cirrhosis include blood tests, imaging tests, and liver biopsy.  Treatment for this condition involves treating the underlying cause. Avoid alcohol, drugs, salt, and medicines that may damage your liver.  Contact your health care provider if you develop ascites, edema, jaundice, fever, nausea or vomiting, easy bruising or bleeding, or worsening fatigue. This information is not intended to replace advice given to you by your health care provider. Make sure you discuss any questions you have with your health care provider. Document Released: 03/25/2005 Document Revised:  07/15/2018 Document Reviewed: 02/12/2017 Elsevier Patient Education  2020 Elsevier Inc.      Agustina Caroli, MD [facility]

## 2018-12-30 ENCOUNTER — Encounter: Payer: Self-pay | Admitting: *Deleted

## 2018-12-30 NOTE — Telephone Encounter (Signed)
Sent my chart message  Lm .  Was the patient able to  Get this medication filled?

## 2019-01-07 NOTE — Telephone Encounter (Signed)
Confirmed that pt has picked up Metformin from pharmacy on 12/28/2018.

## 2019-01-15 ENCOUNTER — Other Ambulatory Visit (INDEPENDENT_AMBULATORY_CARE_PROVIDER_SITE_OTHER): Payer: 59

## 2019-01-15 ENCOUNTER — Encounter: Payer: Self-pay | Admitting: Gastroenterology

## 2019-01-15 ENCOUNTER — Ambulatory Visit: Payer: 59 | Admitting: Gastroenterology

## 2019-01-15 VITALS — BP 110/64 | HR 61 | Temp 97.7°F | Ht 64.0 in | Wt 122.6 lb

## 2019-01-15 DIAGNOSIS — K7460 Unspecified cirrhosis of liver: Secondary | ICD-10-CM

## 2019-01-15 LAB — PROTIME-INR
INR: 1.5 ratio — ABNORMAL HIGH (ref 0.8–1.0)
Prothrombin Time: 17.3 s — ABNORMAL HIGH (ref 9.6–13.1)

## 2019-01-15 LAB — CBC WITH DIFFERENTIAL/PLATELET
Basophils Absolute: 0 10*3/uL (ref 0.0–0.1)
Basophils Relative: 0.7 % (ref 0.0–3.0)
Eosinophils Absolute: 0.2 10*3/uL (ref 0.0–0.7)
Eosinophils Relative: 7.2 % — ABNORMAL HIGH (ref 0.0–5.0)
HCT: 31.8 % — ABNORMAL LOW (ref 39.0–52.0)
Hemoglobin: 11 g/dL — ABNORMAL LOW (ref 13.0–17.0)
Lymphocytes Relative: 32.6 % (ref 12.0–46.0)
Lymphs Abs: 0.7 10*3/uL (ref 0.7–4.0)
MCHC: 34.6 g/dL (ref 30.0–36.0)
MCV: 96.8 fl (ref 78.0–100.0)
Monocytes Absolute: 0.2 10*3/uL (ref 0.1–1.0)
Monocytes Relative: 9.5 % (ref 3.0–12.0)
Neutro Abs: 1 10*3/uL — ABNORMAL LOW (ref 1.4–7.7)
Neutrophils Relative %: 50 % (ref 43.0–77.0)
Platelets: 21 10*3/uL — CL (ref 150.0–400.0)
RBC: 3.29 Mil/uL — ABNORMAL LOW (ref 4.22–5.81)
RDW: 14.4 % (ref 11.5–15.5)
WBC: 2.1 10*3/uL — ABNORMAL LOW (ref 4.0–10.5)

## 2019-01-15 LAB — COMPREHENSIVE METABOLIC PANEL
ALT: 19 U/L (ref 0–53)
AST: 28 U/L (ref 0–37)
Albumin: 3.7 g/dL (ref 3.5–5.2)
Alkaline Phosphatase: 60 U/L (ref 39–117)
BUN: 14 mg/dL (ref 6–23)
CO2: 28 mEq/L (ref 19–32)
Calcium: 8.7 mg/dL (ref 8.4–10.5)
Chloride: 105 mEq/L (ref 96–112)
Creatinine, Ser: 1.06 mg/dL (ref 0.40–1.50)
GFR: 73.72 mL/min (ref >60.00)
Glucose, Bld: 200 mg/dL — ABNORMAL HIGH (ref 70–99)
Potassium: 3.9 mEq/L (ref 3.5–5.1)
Sodium: 137 mEq/L (ref 135–145)
Total Bilirubin: 1.8 mg/dL — ABNORMAL HIGH (ref 0.2–1.2)
Total Protein: 6.7 g/dL (ref 6.0–8.3)

## 2019-01-15 NOTE — Patient Instructions (Addendum)
We will help get you back into see Dr. Elna Breslow at the infectious disease clinic for your TB positive QuantiFERON gold test.  You will have labs checked today in the basement lab.  Please head down after you check out with the front desk  (cbc, cmet, AFP, INR)  Liver US for hepatoma screening.  You have been scheduled for an abdominal ultrasound at [facility] (1st floor of hospital) on 01/21/19 at 1030am. Please arrive 15 minutes prior to your appointment for registration. Make certain not to have anything to eat or drink 6 hours prior to your appointment. Should you need to reschedule your appointment, please contact radiology at [phone]. This test typically takes about 30 minutes to perform.  Return office visit in 4 months  Thank you for entrusting me with your care and choosing [facility].  Dr Ardis Hughs

## 2019-01-15 NOTE — Progress Notes (Signed)
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 Lurlean Leyden and eventual care by Dr. Elna Breslow ID  Thrombocytopenia (Plts around 20K), mild ascites, signs of portal HTN on imaging  Labs 2019:Hepatitis B E antibody reactive,hepatitis B core total antibody reactive,hepatitis B surface antigen reactive,hepatitis B E antigen nonreactive (2 years ago this was positive)hepatitis B surface antibody Nonreactive,hepatitis B DNA 1,200,000 IUs/mL, HIV negative  Cirrhosis etiology workup:Labs 2016:Ferritin slightly elevated,HIV negative, hepatitis C virus IgG negative, anti-smooth muscle antibody IgG negative, ANA negative, HCV Ab negative, ceruloplamin 12.3 (slightly low), A1A normal.  Current MELD: 10/2017 18; 02/2018 MELDNa 19  EGD 10/2017,[name]; small to medium sized esophageal varices, + portal gastropathy. Started nadolol 28m daily.  EGD November 2019 while inpatient for drop in hemoglobin and abdominal pain found the same as above as well as multiple erosions.  Biopsies were taken and he was H. pylori negative.  He was put on antibiotics for H. pylori given serologic positive testing (pylera)  AFP 09/2017:13.7  Liver imaging 5/2019CT without IV contrast: cirrhosis, splenomegaly, mild ascites, signs of portal HTN.  02/2018 CT scan wo IV contrast cirrhosis with no masses.  MRI January 2020 motion degraded, cirrhosis without obvious hepatoma.  [facility]2017 Dr. ZLurlean Leyden 2. Lymphocytic colitis:colonoscopy 10/2017 Dr. JArdis Hughsfor chronic diarrhea: hemorrhoids, o/w normal. Random biopsies + lymphocytic colitis. Started on budesonide 972mdaily.    HPI: [name].  He still has intermittent epigastric and right-sided abdominal pains.  He is here with a professional interpreter today.  He has vomited twice.  It was  nonbloody.  It does not sound like he has had any encephalopathic episodes.  [facility] in early January.  [facility] and he has not really been able to start any of that testing.  Has not been back in to see [facility] either.  QuantiFERON gold testing was performed by the emergency room recently for him and it was positive.  His chest x-ray at the time showed scarring in his left lung base that was stable.  His primary care physician recommended he get into see his ID clinic Dr. to discuss this he.  He has not done that and I do not see that any appointments have been arranged.  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  Constitutional:  No unintentional weight loss   Past Medical History:  Diagnosis Date  . Cirrhosis ([facility]  . Diabetes mellitus ([facility]  . Hepatitis B   . Liver cirrhosis secondary to NASH ([facility]  . Lymphocytic colitis   . Pancytopenia ([facility]  . 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     Past Surgical History:  Procedure Laterality Date  . BIOPSY  10/23/2017   Procedure: BIOPSY;  Surgeon: JaMilus BanisterMD;  [facility];  Service: Endoscopy;;  . BIOPSY  03/03/2018   Procedure: BIOPSY;  Surgeon: MaIrving Copas MD;  [facility] Service: Gastroenterology;;  . COLONOSCOPY WITH PROPOFOL N/A 10/23/2017   Procedure: COLONOSCOPY WITH PROPOFOL;  Surgeon: JaMilus BanisterMD;  [facility];  Service: Endoscopy;  Laterality: N/A;  . COMPLEX WOUND CLOSURE Right 12/15/2015   Procedure: COMPLEX WOUND CLOSURE;  Surgeon: Leanora Cover, MD;  Location: [place];  Service: Orthopedics;  Laterality: Right;  . ESOPHAGOGASTRODUODENOSCOPY (EGD) WITH PROPOFOL N/A 10/23/2017    Procedure: ESOPHAGOGASTRODUODENOSCOPY (EGD) WITH PROPOFOL;  Surgeon: Milus Banister, MD;  [facility];  Service: Endoscopy;  Laterality: N/A;  . ESOPHAGOGASTRODUODENOSCOPY (EGD) WITH PROPOFOL N/A 03/03/2018   Procedure: ESOPHAGOGASTRODUODENOSCOPY (EGD) WITH PROPOFOL;  Surgeon: Rush Landmark Telford Nab., MD;  Location: [place];  Service: Gastroenterology;  Laterality: N/A;  . I&D EXTREMITY Right 12/15/2015   Procedure: IRRIGATION AND DEBRIDEMENT AND REVISION AMPUTATION RIGHT RING FINGER;  Surgeon: Leanora Cover, MD;  Location: [place];  Service: Orthopedics;  Laterality: Right;    Current Outpatient Medications  Medication Sig Dispense Refill  . insulin glargine (LANTUS) 100 UNIT/ML injection Inject 9 Units into the skin daily.    Marland Kitchen lactulose (CHRONULAC) 10 GM/15ML solution Take 45 mLs (30 g total) by mouth daily. 240 mL 3  . levocetirizine (XYZAL) 5 MG tablet TAKE 1 TABLET BY MOUTH EVERY EVENING 30 tablet 0  . nadolol (CORGARD) 20 MG tablet Take 1 tablet (20 mg total) by mouth daily. 30 tablet 6  . omeprazole (PRILOSEC) 40 MG capsule Take 1 capsule (40 mg total) by mouth daily before breakfast. 30 capsule 6  . spironolactone (ALDACTONE) 100 MG tablet TAKE 1 TABLET BY MOUTH DAILY 90 tablet 3  . sucralfate (CARAFATE) 1 GM/10ML suspension Take 10 mLs (1 g total) by mouth 4 (four) times daily. Take between meals and at bedtime. 420 mL 3  . tenofovir (VIREAD) 300 MG tablet Take 1 tablet (300 mg total) by mouth daily. 30 tablet 5  . bismuth-metronidazole-tetracycline (PYLERA) 140-125-125 MG capsule Take 3 capsules by mouth 4 (four) times daily -  before meals and at bedtime for 14 days. 168 capsule 0  . linagliptin (TRADJENTA) 5 MG TABS tablet Take 1 tablet (5 mg total) by mouth daily. 90 tablet 1   No current facility-administered medications for this visit.     Allergies as of 01/15/2019  . (No Known Allergies)    Family History  Problem Relation Age of Onset  . Colon cancer Neg  Hx   . Esophageal cancer Neg Hx   . Pancreatic cancer Neg Hx   . Stomach cancer Neg Hx   . Liver disease Neg Hx     Social History   Socioeconomic History  . Marital status: Married    Spouse name: Not on file  . Number of children: 4  . Years of education: Not on file  . Highest education level: Not on file  Occupational History  . 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 Financial resource strain: Not on file  . Food insecurity    Worry: Not on file    Inability: Not on file  . Transportation needs    Medical: Not on file    Non-medical: Not on file  Tobacco Use  . Smoking status: Former Smoker    Packs/day: 0.50    Years: 8.00    Pack years: 4.00    Types: Cigarettes    Quit date: 04/08/1990    Years since quitting: 28.7  . Smokeless tobacco: Never Used  Substance and Sexual Activity  . Alcohol use: No  . Drug use: No  . Sexual activity: Yes  Lifestyle  . Physical activity    Days per week: Not on  file    Minutes per session: Not on file  . Stress: Not on file  Relationships  . Social Herbalist on phone: Not on file    Gets together: Not on file    Attends religious service: Not on file    Active member of club or organization: Not on file    Attends meetings of clubs or organizations: Not on file    Relationship status: Not on file  . Intimate partner violence    Fear of current or ex partner: Not on file    Emotionally abused: Not on file    Physically abused: Not on file    Forced sexual activity: Not on file  Other Topics Concern  . 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  Lives with wife and four children.      Physical Exam: BP 110/64   Pulse 61   Temp 97.7 F (36.5 C)   Ht 5' 4"  (1.626 m)   Wt 122 lb 9.6 oz (55.6 kg)   BMI 21.04 kg/m  Constitutional: generally well-appearing Psychiatric: alert and oriented x3 Abdomen: soft, nontender, nondistended, no obvious ascites, no  peritoneal signs, normal bowel sounds No peripheral edema noted in lower extremities  Assessment and plan: 48 y.o. 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  His last visit there was 8 or 9 months ago.  Transplant work-up was ongoing however the pandemic intervened and he is really had not been able to get any testing through [facility] yet.  We will [facility] to get [facility] back in.  We will also reach out to his infectious disease clinic so he can be seen for his QuantiFERON gold positive TB test recently.  Of note chest x-ray September 2020 showed no active disease.  He needs restaging of his liver disease and hepatoma screening.  We will set up right upper quadrant ultrasound of his liver, alpha-fetoprotein, CBC, complete metabolic profile, coags and he will return to see me in 4 months.  Please see the "Patient Instructions" section for addition details about the plan.  Owens Loffler, MD [facility] 01/15/2019, 2:06 PM

## 2019-01-18 ENCOUNTER — Telehealth: Payer: Self-pay | Admitting: *Deleted

## 2019-01-18 ENCOUNTER — Telehealth: Payer: Self-pay

## 2019-01-18 LAB — AFP TUMOR MARKER: AFP-Tumor Marker: 11.3 ng/mL — ABNORMAL HIGH (ref <6.1)

## 2019-01-18 NOTE — Telephone Encounter (Signed)
I spoke with Jose Snow at the [facility] for Infectious Disease to schedule an appointment for patient with Dr Elna Breslow for positive TB test. She said to fax over last office note and they will get him  Scheduled. They will contact the patient with a date and time.

## 2019-01-18 NOTE — Telephone Encounter (Signed)
Claiborne Billings from Dr Eugenia Pancoast office is calling for advice regarding patient's positive quantiferon gold tests, asking if YK needs to follow up at [facility] or at the Health department.  He is currently seen here for Hep B. Please advise. Landis Gandy, RN

## 2019-01-19 NOTE — Telephone Encounter (Signed)
Patient has been scheduled with you for 10/30.  RN left message with [facility] [phone]) to see if patient has been treated there and requested notes to be faxed to [phone].

## 2019-01-19 NOTE — Telephone Encounter (Signed)
Spoke with Burna Mortimer, TB case manager at [facility]  She states he was treated 11/13/2000 - 05/28/2002 for TB.  They are actively trying to get in touch with patient to collect sputum x 3 after his ED visit 9/3 because of recent travel (2017) but have low suspicion for reinfection.  Per Burna Mortimer, patient with negative smear 9/3, not infectious.  RN relayed current contact information, Burna Mortimer will reach out.

## 2019-01-19 NOTE — Telephone Encounter (Signed)
We would need to know if he has ever been treated for latent TB and that is something that we can discuss if needed.

## 2019-01-20 NOTE — Telephone Encounter (Signed)
If Health Department is following than no need for follow up.

## 2019-01-21 ENCOUNTER — Other Ambulatory Visit: Payer: Self-pay

## 2019-01-21 ENCOUNTER — Ambulatory Visit (HOSPITAL_COMMUNITY)
Admission: RE | Admit: 2019-01-21 | Discharge: 2019-01-21 | Disposition: A | Discharge status: Home / Self Care | Payer: 59 | Source: Ambulatory Visit | Attending: Gastroenterology | Admitting: Gastroenterology

## 2019-01-21 DIAGNOSIS — K7460 Unspecified cirrhosis of liver: Secondary | ICD-10-CM

## 2019-01-25 LAB — ACID FAST CULTURE WITH REFLEXED SENSITIVITIES (MYCOBACTERIA): Acid Fast Culture: NEGATIVE

## 2019-02-05 ENCOUNTER — Other Ambulatory Visit: Payer: Self-pay

## 2019-02-05 ENCOUNTER — Encounter: Payer: Self-pay | Admitting: Family

## 2019-02-05 ENCOUNTER — Ambulatory Visit (INDEPENDENT_AMBULATORY_CARE_PROVIDER_SITE_OTHER): Payer: 59 | Admitting: Family

## 2019-02-05 VITALS — BP 99/62 | HR 56 | Temp 97.8°F | Wt 121.0 lb

## 2019-02-05 DIAGNOSIS — Z8611 Personal history of tuberculosis: Secondary | ICD-10-CM | POA: Diagnosis not present

## 2019-02-05 NOTE — Patient Instructions (Signed)
Nice to see you.  No treatment is necessary at this time and Quantiferon gold will be positive as you have had tuberculosis in the past.   For screening purposes would need a chest x-ray and / or sputum samples which do not appear to be needed today.   Please let us know if you have any quesitons.

## 2019-02-05 NOTE — Progress Notes (Signed)
Subjective:    Patient ID: Jose Snow, male    DOB: 02-Jan-1971, 48 y.o.   MRN: [id number]  Chief Complaint  Patient presents with  . Positive QuantiFERON gold     HPI:  Jose Snow is a 48 y.o. male with Hepatitis B and previous infection with Tuberculosis presents today with a positive Quantiferon Gold testing and concern for evaluation / need of treatment related to tuberculosis. Jose Snow speaks primarily [place] with a medical translator present to aid in communication.    Jose Snow was initially infected and treated for Tuberculosis in 2002 when [place] was [place] prior to arrival to the [place] when leaving [place]. Jose Snow was previously seen in the ED on 12/10/2018 with hemoptysis with x-ray imaging showing no active disease and possible scarring of the left lung and prior granulomatous disease.  A follow-up CT scan showed scarring involving the apices of the left lower lobe with possibility of malignancy.  There are also faint clusters of nodular density in the lingula which may be chronic.  Cirrhosis was present with evidence of portal hypertension and splenomegaly.  A QuantiFERON gold test returned positive.  Jose Snow is currently in care for chronic hepatitis B complicated with cirrhosis and portal hypertension.  Denies any recent fevers, chills, productive cough, or weight loss.  He does not believe he has had any recent night sweats.  He has not been around anybody with suspected tuberculosis recently.  Has not traveled outside of [place].  No Known Allergies    Outpatient Medications Prior to Visit  Medication Sig Dispense Refill  . insulin glargine (LANTUS) 100 UNIT/ML injection Inject 9 Units into the skin daily.    Marland Kitchen levocetirizine (XYZAL) 5 MG tablet TAKE 1 TABLET BY MOUTH EVERY EVENING 30 tablet 0  . nadolol (CORGARD) 20 MG tablet Take 1 tablet (20 mg total) by mouth daily. 30 tablet 6  . omeprazole (PRILOSEC) 40 MG capsule Take 1  capsule (40 mg total) by mouth daily before breakfast. 30 capsule 6  . spironolactone (ALDACTONE) 100 MG tablet TAKE 1 TABLET BY MOUTH DAILY 90 tablet 3  . tenofovir (VIREAD) 300 MG tablet Take 1 tablet (300 mg total) by mouth daily. 30 tablet 5  . bismuth-metronidazole-tetracycline (PYLERA) 140-125-125 MG capsule Take 3 capsules by mouth 4 (four) times daily -  before meals and at bedtime for 14 days. 168 capsule 0  . lactulose (CHRONULAC) 10 GM/15ML solution Take 45 mLs (30 g total) by mouth daily. (Patient not taking: Reported on 02/05/2019) 240 mL 3  . linagliptin (TRADJENTA) 5 MG TABS tablet Take 1 tablet (5 mg total) by mouth daily. 90 tablet 1  . sucralfate (CARAFATE) 1 GM/10ML suspension Take 10 mLs (1 g total) by mouth 4 (four) times daily. Take between meals and at bedtime. (Patient not taking: Reported on 02/05/2019) 420 mL 3   No facility-administered medications prior to visit.      Past Medical History:  Diagnosis Date  . Cirrhosis ([place])   . Diabetes mellitus ([place])   . Hepatitis B   . Liver cirrhosis secondary to NASH ([place])   . Lymphocytic colitis   . Pancytopenia ([place])   . 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      Past Surgical History:  Procedure Laterality Date  . BIOPSY  10/23/2017   Procedure: BIOPSY;  Surgeon: Milus Banister, MD;  [facility];  Service: Endoscopy;;  .  BIOPSY  03/03/2018   Procedure: BIOPSY;  Surgeon: Rush Landmark Telford Nab., MD;  Location: [place];  Service: Gastroenterology;;  . COLONOSCOPY WITH PROPOFOL N/A 10/23/2017   Procedure: COLONOSCOPY WITH PROPOFOL;  Surgeon: Milus Banister, MD;  [facility];  Service: Endoscopy;  Laterality: N/A;  . COMPLEX WOUND CLOSURE Right 12/15/2015   Procedure: COMPLEX WOUND CLOSURE;  Surgeon: Leanora Cover, MD;  Location: [place];  Service: Orthopedics;  Laterality: Right;  . ESOPHAGOGASTRODUODENOSCOPY (EGD) WITH PROPOFOL N/A 10/23/2017   Procedure:  ESOPHAGOGASTRODUODENOSCOPY (EGD) WITH PROPOFOL;  Surgeon: Milus Banister, MD;  [facility];  Service: Endoscopy;  Laterality: N/A;  . ESOPHAGOGASTRODUODENOSCOPY (EGD) WITH PROPOFOL N/A 03/03/2018   Procedure: ESOPHAGOGASTRODUODENOSCOPY (EGD) WITH PROPOFOL;  Surgeon: Rush Landmark Telford Nab., MD;  Location: [place];  Service: Gastroenterology;  Laterality: N/A;  . I&D EXTREMITY Right 12/15/2015   Procedure: IRRIGATION AND DEBRIDEMENT AND REVISION AMPUTATION RIGHT RING FINGER;  Surgeon: Leanora Cover, MD;  Location: [place];  Service: Orthopedics;  Laterality: Right;    Review of Systems  Constitutional: Negative for chills, diaphoresis, fatigue and fever.  Respiratory: Negative for cough, chest tightness, shortness of breath and wheezing.   Cardiovascular: Negative for chest pain.  Gastrointestinal: Negative for abdominal distention, abdominal pain, constipation, diarrhea, nausea and vomiting.  Neurological: Negative for weakness and headaches.  Hematological: Does not bruise/bleed easily.      Objective:    BP 99/62   Pulse (!) 56   Temp 97.8 F (36.6 C)   Wt 121 lb (54.9 kg)   BMI 20.77 kg/m  Nursing note and vital signs reviewed.  Physical Exam Constitutional:      General: He is not in acute distress.    Appearance: He is well-developed.  Cardiovascular:     Rate and Rhythm: Normal rate and regular rhythm.     Heart sounds: Normal heart sounds.  Pulmonary:     Effort: Pulmonary effort is normal.     Breath sounds: Normal breath sounds. No wheezing or rales.  Skin:    General: Skin is warm and dry.  Neurological:     Mental Status: He is alert and oriented to person, place, and time.  Psychiatric:        Behavior: Behavior normal.        Thought Content: Thought content normal.        Judgment: Judgment normal.     Depression screen [facility] 2/9 12/21/2018 08/20/2018 05/14/2018 05/01/2018 02/27/2018  Decreased Interest 0 0 0 0 0  Down, Depressed, Hopeless 0 0 0 0  0  PHQ - 2 Score 0 0 0 0 0       Assessment & Plan:    Patient Active Problem List   Diagnosis Date Noted  . Hepatitis B 02/17/2015    Priority: High  . Cirrhosis of liver due to hepatitis B ([place]) 11/25/2014    Priority: High  . Pancytopenia ([place]) 12/03/2017  . Abnormal echocardiogram 12/03/2017  . New onset type 2 diabetes mellitus ([place]) 12/03/2017  . History of hepatitis B 12/03/2017  . Esophageal varices without bleeding ([place])   . Heart murmur 08/01/2017  . Thrombocytopenia ([place]) 11/25/2014  . History of TB (tuberculosis) 07/16/2010     Problem List Items Addressed This Visit      Other   History of TB (tuberculosis) - Primary    Jose Snow has a positive QuantiFERON gold test in the setting of previous TB infection and treatment in 2002.  He does not appear to  have any active symptoms associated with tuberculosis at present and hemoptysis likely related to his cirrhosis.  Discussed the QuantiFERON is likely to remain positive indefinitely and should TB be suspected he would need additional imaging and or sputum cultures.           I am having Y K. Fesler maintain his levocetirizine, insulin glargine, bismuth-metronidazole-tetracycline, nadolol, linagliptin, lactulose, sucralfate, spironolactone, tenofovir, and omeprazole.   Follow-up: Return if symptoms worsen or fail to improve.   Terri Piedra, MSN, FNP-C Nurse Practitioner [facility] for Infectious Disease [place] number: [phone]

## 2019-02-05 NOTE — Assessment & Plan Note (Signed)
Jose Snow has a positive QuantiFERON gold test in the setting of previous TB infection and treatment in 2002.  He does not appear to have any active symptoms associated with tuberculosis at present and hemoptysis likely related to his cirrhosis.  Discussed the QuantiFERON is likely to remain positive indefinitely and should TB be suspected he would need additional imaging and or sputum cultures.

## 2019-02-23 ENCOUNTER — Telehealth: Payer: Self-pay | Admitting: Emergency Medicine

## 2019-02-23 NOTE — Telephone Encounter (Signed)
LVM to r/s appt on 06/25/2019 with Dr. Mitchel Honour. Provider will be out of the office on that day

## 2019-04-12 ENCOUNTER — Ambulatory Visit (INDEPENDENT_AMBULATORY_CARE_PROVIDER_SITE_OTHER): Payer: 59 | Admitting: Family

## 2019-04-12 ENCOUNTER — Encounter: Payer: Self-pay | Admitting: Family

## 2019-04-12 ENCOUNTER — Other Ambulatory Visit: Payer: Self-pay

## 2019-04-12 DIAGNOSIS — K7460 Unspecified cirrhosis of liver: Secondary | ICD-10-CM | POA: Diagnosis not present

## 2019-04-12 DIAGNOSIS — B181 Chronic viral hepatitis B without delta-agent: Secondary | ICD-10-CM

## 2019-04-12 DIAGNOSIS — B1910 Unspecified viral hepatitis B without hepatic coma: Secondary | ICD-10-CM | POA: Diagnosis not present

## 2019-04-12 MED ORDER — TENOFOVIR DISOPROXIL FUMARATE 300 MG PO TABS: 300.0000 mg | ORAL_TABLET | Freq: Every day | ORAL | 5 refills | Status: DC

## 2019-04-12 NOTE — Assessment & Plan Note (Signed)
Currently awaiting referral to [facility] for liver evaluation. Will check with GI to determine current status of referral. In the interim, continue management per [name].

## 2019-04-12 NOTE — Progress Notes (Signed)
Subjective:    Patient ID: Jose Snow, male    DOB: 1970-11-25, 49 y.o.   MRN: [id number]  Chief Complaint  Patient presents with  . Hepatitis B     HPI:  Dravin Lance is a 49 y.o. male with chronic hepatitis B infection complicated by cirrhosis of the liver who was last seen in the office on 12/10/2018 with good adherence and tolerance to his antiviral regimen with tenofovir.  Last seen by gastroenterology on 01/15/2019 with recommendations for reestablishing care with [facility] for transplant work-up which has been ongoing due to the pandemic. During this visit was noted to have a positive Quantiferon Gold test and was referred back for evaluation. Previously treated in 2002 with no signs of acute infection. No additional treatment was recommended at that time. Mr. Helle speaks primarily [place] and a medical translator was available via phone for aid in communication.   Mr. Gut continues to take his Tenofovir as prescribed with no adverse side effects or missed doses. Been have pain located in his right upper quandrant that has worsened since the holidays. Denies jaundice, scleral icterus, changes in stool, nausea, or vomiting. Has had no additional hemoptysis since his last office visit.      No Known Allergies    Outpatient Medications Prior to Visit  Medication Sig Dispense Refill  . insulin glargine (LANTUS) 100 UNIT/ML injection Inject 9 Units into the skin daily.    Marland Kitchen lactulose (CHRONULAC) 10 GM/15ML solution Take 45 mLs (30 g total) by mouth daily. 240 mL 3  . levocetirizine (XYZAL) 5 MG tablet TAKE 1 TABLET BY MOUTH EVERY EVENING 30 tablet 0  . nadolol (CORGARD) 20 MG tablet Take 1 tablet (20 mg total) by mouth daily. 30 tablet 6  . omeprazole (PRILOSEC) 40 MG capsule Take 1 capsule (40 mg total) by mouth daily before breakfast. 30 capsule 6  . spironolactone (ALDACTONE) 100 MG tablet TAKE 1 TABLET BY MOUTH DAILY 90 tablet 3  . sucralfate (CARAFATE) 1 GM/10ML  suspension Take 10 mLs (1 g total) by mouth 4 (four) times daily. Take between meals and at bedtime. 420 mL 3  . tenofovir (VIREAD) 300 MG tablet Take 1 tablet (300 mg total) by mouth daily. 30 tablet 5  . bismuth-metronidazole-tetracycline (PYLERA) 140-125-125 MG capsule Take 3 capsules by mouth 4 (four) times daily -  before meals and at bedtime for 14 days. 168 capsule 0  . linagliptin (TRADJENTA) 5 MG TABS tablet Take 1 tablet (5 mg total) by mouth daily. 90 tablet 1   No facility-administered medications prior to visit.     Past Medical History:  Diagnosis Date  . Cirrhosis ([place])   . Diabetes mellitus ([place])   . Hepatitis B   . Liver cirrhosis secondary to NASH ([place])   . Lymphocytic colitis   . Pancytopenia ([place])   . 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      Past Surgical History:  Procedure Laterality Date  . BIOPSY  10/23/2017   Procedure: BIOPSY;  Surgeon: Milus Banister, MD;  Location: [facility];  Service: Endoscopy;;  . BIOPSY  03/03/2018   Procedure: BIOPSY;  Surgeon: Irving Copas., MD;  Location: [facility];  Service: Gastroenterology;;  . COLONOSCOPY WITH PROPOFOL N/A 10/23/2017   Procedure: COLONOSCOPY WITH PROPOFOL;  Surgeon: Milus Banister, MD;  [facility]: [facility];  Service: Endoscopy;  Laterality: N/A;  . COMPLEX WOUND CLOSURE Right 12/15/2015   Procedure: COMPLEX  WOUND CLOSURE;  Surgeon: Leanora Cover, MD;  Location: [facility];  Service: Orthopedics;  Laterality: Right;  . ESOPHAGOGASTRODUODENOSCOPY (EGD) WITH PROPOFOL N/A 10/23/2017   Procedure: ESOPHAGOGASTRODUODENOSCOPY (EGD) WITH PROPOFOL;  Surgeon: Milus Banister, MD;  [facility];  Service: Endoscopy;  Laterality: N/A;  . ESOPHAGOGASTRODUODENOSCOPY (EGD) WITH PROPOFOL N/A 03/03/2018   Procedure: ESOPHAGOGASTRODUODENOSCOPY (EGD) WITH PROPOFOL;  Surgeon: Rush Landmark Telford Nab., MD;  Location: [facility];  Service: Gastroenterology;  Laterality: N/A;  . I & D  EXTREMITY Right 12/15/2015   Procedure: IRRIGATION AND DEBRIDEMENT AND REVISION AMPUTATION RIGHT RING FINGER;  Surgeon: Leanora Cover, MD;  Location: [facility];  Service: Orthopedics;  Laterality: Right;       Review of Systems  Constitutional: Negative for chills, diaphoresis, fatigue and fever.  Respiratory: Negative for cough, chest tightness, shortness of breath and wheezing.   Cardiovascular: Negative for chest pain.  Gastrointestinal: Positive for abdominal pain. Negative for constipation, diarrhea, nausea and vomiting.  Neurological: Negative for weakness and headaches.  Hematological: Does not bruise/bleed easily.      Objective:    There were no vitals taken for this visit. Nursing note and vital signs reviewed.  Physical Exam Constitutional:      General: He is not in acute distress.    Appearance: He is well-developed.  Cardiovascular:     Rate and Rhythm: Normal rate and regular rhythm.     Heart sounds: Normal heart sounds. No murmur. No friction rub. No gallop.   Pulmonary:     Effort: Pulmonary effort is normal. No respiratory distress.     Breath sounds: Normal breath sounds. No wheezing or rales.  Chest:     Chest wall: No tenderness.  Abdominal:     General: Bowel sounds are normal. There is no distension.     Palpations: Abdomen is soft. There is no mass.     Tenderness: There is abdominal tenderness in the right upper quadrant. There is no guarding or rebound.  Skin:    General: Skin is warm and dry.  Neurological:     Mental Status: He is alert and oriented to person, place, and time.  Psychiatric:        Behavior: Behavior normal.        Thought Content: Thought content normal.        Judgment: Judgment normal.      Depression screen [facility] 2/9 04/12/2019 12/21/2018 08/20/2018 05/14/2018 05/01/2018  Decreased Interest 0 0 0 0 0  Down, Depressed, Hopeless 0 0 0 0 0  PHQ - 2 Score 0 0 0 0 0       Assessment & Plan:    Patient Active Problem List    Diagnosis Date Noted  . Hepatitis B 02/17/2015    Priority: High  . Cirrhosis of liver due to hepatitis B ([place]) 11/25/2014    Priority: High  . Pancytopenia ([place]) 12/03/2017  . Abnormal echocardiogram 12/03/2017  . New onset type 2 diabetes mellitus ([place]) 12/03/2017  . History of hepatitis B 12/03/2017  . Esophageal varices without bleeding ([place])   . Heart murmur 08/01/2017  . Thrombocytopenia ([place]) 11/25/2014  . History of TB (tuberculosis) 07/16/2010     Problem List Items Addressed This Visit      Digestive   Cirrhosis of liver due to hepatitis B [facility])    Currently awaiting referral to [facility] for liver evaluation. Will check with GI to determine current status of referral. In the interim, continue management per [name].  Relevant Orders   Hepatitis B surface antigen   Hepatitis B surface antibody   Hepatitis B DNA, Ultraquantitative, PCR   Hepatic function panel   Protime-INR   CBC   Hepatitis B - Primary    Mr. Baudoin has chronic Hepatitis C complicated by cirrhosis and currently working to see liver specialist at a transplant center. Does have some right upper quadrant upper abdominal pain today. Will check blood work today and continue current dose of tenofovir indefinitely. Plan for follow up in 6 months or sooner if needed unless under care of liver specialist.       Relevant Medications   tenofovir (VIREAD) 300 MG tablet   Other Relevant Orders   Hepatitis B surface antigen   Hepatitis B surface antibody   Hepatitis B DNA, Ultraquantitative, PCR   Hepatic function panel   Protime-INR   CBC       I am having Y K. Marseille maintain his levocetirizine, insulin glargine, bismuth-metronidazole-tetracycline, nadolol, linagliptin, lactulose, sucralfate, spironolactone, omeprazole, and tenofovir.   Meds ordered this encounter  Medications  . tenofovir (VIREAD) 300 MG tablet    Sig: Take 1 tablet (300 mg total) by mouth daily.    Dispense:  30  tablet    Refill:  5    Order Specific Question:   Supervising Provider    Answer:   Carlyle Basques [[id number]]     Follow-up: Return in about 6 months (around 10/10/2019), or if symptoms worsen or fail to improve.   Terri Piedra, MSN, FNP-C Nurse Practitioner [facility] number: [phone]

## 2019-04-12 NOTE — Assessment & Plan Note (Addendum)
Mr. Mangel has chronic Hepatitis C complicated by cirrhosis and currently working to see liver specialist at a transplant center. Does have some right upper quadrant upper abdominal pain today. Will check blood work today and continue current dose of tenofovir indefinitely. Plan for follow up in 6 months or sooner if needed unless under care of liver specialist.

## 2019-04-12 NOTE — Patient Instructions (Signed)
Nice to see you.  We will check your blood work today.  Continue to take your Tenofovir daily.  Refills will be sent to the pharmacy.  We will check with Dr. Eugenia Pancoast office regarding referral to liver specialist in [place].   Plan for follow up in 6 months or sooner if needed pending transfer of care to liver specialist.   Have a great day and stay safe!

## 2019-04-15 LAB — PROTIME-INR
INR: 1.2 — ABNORMAL HIGH
Prothrombin Time: 12.8 s — ABNORMAL HIGH (ref 9.0–11.5)

## 2019-04-15 LAB — HEPATIC FUNCTION PANEL
AG Ratio: 1.2 (calc) (ref 1.0–2.5)
ALT: 18 U/L (ref 9–46)
AST: 25 U/L (ref 10–35)
Albumin: 3.8 g/dL (ref 3.6–5.1)
Alkaline phosphatase (APISO): 62 U/L (ref 35–144)
Bilirubin, Direct: 0.4 mg/dL — ABNORMAL HIGH (ref 0.0–0.2)
Globulin: 3.1 g/dL (calc) (ref 1.9–3.7)
Indirect Bilirubin: 1.1 mg/dL (calc) (ref 0.2–1.2)
Total Bilirubin: 1.5 mg/dL — ABNORMAL HIGH (ref 0.2–1.2)
Total Protein: 6.9 g/dL (ref 6.1–8.1)

## 2019-04-15 LAB — CBC
HCT: 35.5 % — ABNORMAL LOW (ref 38.5–50.0)
Hemoglobin: 12.6 g/dL — ABNORMAL LOW (ref 13.2–17.1)
MCH: 33.2 pg — ABNORMAL HIGH (ref 27.0–33.0)
MCHC: 35.5 g/dL (ref 32.0–36.0)
MCV: 93.7 fL (ref 80.0–100.0)
MPV: 11.9 fL (ref 7.5–12.5)
Platelets: 26 10*3/uL — ABNORMAL LOW (ref 140–400)
RBC: 3.79 10*6/uL — ABNORMAL LOW (ref 4.20–5.80)
RDW: 13.9 % (ref 11.0–15.0)
WBC: 2.4 10*3/uL — ABNORMAL LOW (ref 3.8–10.8)

## 2019-04-15 LAB — HEPATITIS B SURFACE ANTIGEN: Hepatitis B Surface Ag: REACTIVE — AB

## 2019-04-15 LAB — HEPATITIS B SURFACE ANTIBODY,QUALITATIVE: Hep B S Ab: NONREACTIVE

## 2019-04-15 LAB — HEPATITIS B DNA, ULTRAQUANTITATIVE, PCR
Hepatitis B DNA (Calc): <1 Log IU/mL — ABNORMAL HIGH
Hepatitis B DNA: <10 IU/mL — ABNORMAL HIGH

## 2019-05-07 ENCOUNTER — Other Ambulatory Visit: Payer: Self-pay | Admitting: Family

## 2019-06-03 ENCOUNTER — Other Ambulatory Visit: Payer: Self-pay

## 2019-06-03 MED ORDER — NADOLOL 20 MG PO TABS: 20.0000 mg | ORAL_TABLET | Freq: Every day | ORAL | 1 refills | Status: DC

## 2019-06-08 ENCOUNTER — Emergency Department (HOSPITAL_COMMUNITY)
Admission: EM | Admit: 2019-06-08 | Discharge: 2019-06-08 | Disposition: A | Discharge status: Home / Self Care | Payer: 59 | Attending: Emergency Medicine | Admitting: Emergency Medicine

## 2019-06-08 ENCOUNTER — Emergency Department (HOSPITAL_COMMUNITY): Payer: 59

## 2019-06-08 ENCOUNTER — Encounter (HOSPITAL_COMMUNITY): Payer: Self-pay

## 2019-06-08 ENCOUNTER — Other Ambulatory Visit: Payer: Self-pay

## 2019-06-08 DIAGNOSIS — E119 Type 2 diabetes mellitus without complications: Secondary | ICD-10-CM | POA: Diagnosis not present

## 2019-06-08 DIAGNOSIS — Z79899 Other long term (current) drug therapy: Secondary | ICD-10-CM | POA: Diagnosis not present

## 2019-06-08 DIAGNOSIS — Z87891 Personal history of nicotine dependence: Secondary | ICD-10-CM | POA: Insufficient documentation

## 2019-06-08 DIAGNOSIS — Z794 Long term (current) use of insulin: Secondary | ICD-10-CM | POA: Diagnosis not present

## 2019-06-08 DIAGNOSIS — R0789 Other chest pain: Secondary | ICD-10-CM | POA: Diagnosis present

## 2019-06-08 DIAGNOSIS — K219 Gastro-esophageal reflux disease without esophagitis: Secondary | ICD-10-CM | POA: Diagnosis not present

## 2019-06-08 LAB — CBC WITH DIFFERENTIAL/PLATELET
Abs Immature Granulocytes: 0 10*3/uL (ref 0.00–0.07)
Basophils Absolute: 0 10*3/uL (ref 0.0–0.1)
Basophils Relative: 1 %
Eosinophils Absolute: 0.1 10*3/uL (ref 0.0–0.5)
Eosinophils Relative: 6 %
HCT: 35 % — ABNORMAL LOW (ref 39.0–52.0)
Hemoglobin: 12 g/dL — ABNORMAL LOW (ref 13.0–17.0)
Immature Granulocytes: 0 %
Lymphocytes Relative: 35 %
Lymphs Abs: 0.6 10*3/uL — ABNORMAL LOW (ref 0.7–4.0)
MCH: 33.3 pg (ref 26.0–34.0)
MCHC: 34.3 g/dL (ref 30.0–36.0)
MCV: 97.2 fL (ref 80.0–100.0)
Monocytes Absolute: 0.1 10*3/uL (ref 0.1–1.0)
Monocytes Relative: 7 %
Neutro Abs: 1 10*3/uL — ABNORMAL LOW (ref 1.7–7.7)
Neutrophils Relative %: 51 %
Platelets: 20 10*3/uL — CL (ref 150–400)
RBC: 3.6 MIL/uL — ABNORMAL LOW (ref 4.22–5.81)
RDW: 15 % (ref 11.5–15.5)
WBC: 1.8 10*3/uL — ABNORMAL LOW (ref 4.0–10.5)
nRBC: 0 % (ref 0.0–0.2)

## 2019-06-08 LAB — COMPREHENSIVE METABOLIC PANEL
ALT: 26 U/L (ref 0–44)
AST: 32 U/L (ref 15–41)
Albumin: 3.5 g/dL (ref 3.5–5.0)
Alkaline Phosphatase: 60 U/L (ref 38–126)
Anion gap: 10 (ref 5–15)
BUN: 12 mg/dL (ref 6–20)
CO2: 24 mmol/L (ref 22–32)
Calcium: 9.1 mg/dL (ref 8.9–10.3)
Chloride: 103 mmol/L (ref 98–111)
Creatinine, Ser: 1.22 mg/dL (ref 0.61–1.24)
GFR calc Af Amer: >60 mL/min (ref >60)
GFR calc non Af Amer: >60 mL/min (ref >60)
Glucose, Bld: 162 mg/dL — ABNORMAL HIGH (ref 70–99)
Potassium: 4.1 mmol/L (ref 3.5–5.1)
Sodium: 137 mmol/L (ref 135–145)
Total Bilirubin: 2.8 mg/dL — ABNORMAL HIGH (ref 0.3–1.2)
Total Protein: 6.7 g/dL (ref 6.5–8.1)

## 2019-06-08 LAB — LIPASE, BLOOD: Lipase: 37 U/L (ref 11–51)

## 2019-06-08 MED ORDER — BENZONATATE 100 MG PO CAPS: 100.0000 mg | ORAL_CAPSULE | Freq: Three times a day (TID) | ORAL | 0 refills | Status: DC | PRN

## 2019-06-08 MED ORDER — ALUM & MAG HYDROXIDE-SIMETH 200-200-20 MG/5ML PO SUSP
30.0000 mL | Freq: Once | ORAL | Status: AC
  Administered 2019-06-08: 30 mL via ORAL
  Filled 2019-06-08: qty 30

## 2019-06-08 NOTE — ED Provider Notes (Signed)
[facility] EMERGENCY DEPARTMENT Provider Note   CSN: [id number] Arrival date & time: 06/08/19  0732     History Chief Complaint  Patient presents with  . Chest Pain  . Cough    Jose Snow is a 49 y.o. male with PMH significant for hepatitis B, cirrhosis, pancytopenia, TB treated in 2002, IDDM, and GERD who presents to the ED with a 3-day history of worsening cough with associated 5 out of 10 substernal chest burning.  Patient speaks [place], a rare dialect of [place], and does not speak [place].  We are unable to obtain translator services and relied on her daughter, who speaks [place] well, for translation.  Patient is experiencing RUQ and epigastric discomfort in addition to his worsening cough.  His symptoms are worse at night.  His chest pain is associated with his cough.  He has been taking Mucinex, with little relief.  He is followed by infectious disease, gastroenterology, as well as a family medicine provider.  Patient's cough is mildly productive of white sputum.  He feels as though he is mildly short of breath during episodes of coughing, however no shortness of breath otherwise.  He denies any fevers or chills, headache or dizziness, recent illness, difficulty breathing, exertional chest pain, sore throat, nausea or vomiting, urinary symptoms, melena or hematochezia, or other changes in bowel habits.  His appetite is intact.  I reviewed patient's medical record and he has been evaluated for similar complaints in the past and they appear to be of chronic nature.  Most recent note from [name], his gastroenterologist, 01/15/2019 reports more epigastric and right upper quadrant abdominal discomfort and encouraged patient to reestablish with his [facility] liver specialist.  HPI     Past Medical History:  Diagnosis Date  . Cirrhosis ([place])   . Diabetes mellitus ([place])   . Hepatitis B   . Liver cirrhosis secondary to NASH ([place])   . Lymphocytic colitis    . Pancytopenia ([place])   . 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     Patient Active Problem List   Diagnosis Date Noted  . Pancytopenia ([place]) 12/03/2017  . Abnormal echocardiogram 12/03/2017  . New onset type 2 diabetes mellitus ([place]) 12/03/2017  . History of hepatitis B 12/03/2017  . Esophageal varices without bleeding ([place])   . Heart murmur 08/01/2017  . Hepatitis B 02/17/2015  . Cirrhosis of liver due to hepatitis B ([place]) 11/25/2014  . Thrombocytopenia ([place]) 11/25/2014  . History of TB (tuberculosis) 07/16/2010    Past Surgical History:  Procedure Laterality Date  . BIOPSY  10/23/2017   Procedure: BIOPSY;  Surgeon: Milus Banister, MD;  Location[facility];  Service: Endoscopy;;  . BIOPSY  03/03/2018   Procedure: BIOPSY;  Surgeon: Irving Copas., MD;  Location: [facility];  Service: Gastroenterology;;  . COLONOSCOPY WITH PROPOFOL N/A 10/23/2017   Procedure: COLONOSCOPY WITH PROPOFOL;  Surgeon: Milus Banister, MD;  [facility];  Service: Endoscopy;  Laterality: N/A;  . COMPLEX WOUND CLOSURE Right 12/15/2015   Procedure: COMPLEX WOUND CLOSURE;  Surgeon: Leanora Cover, MD;  Location: [facility];  Service: Orthopedics;  Laterality: Right;  . ESOPHAGOGASTRODUODENOSCOPY (EGD) WITH PROPOFOL N/A 10/23/2017   Procedure: ESOPHAGOGASTRODUODENOSCOPY (EGD) WITH PROPOFOL;  Surgeon: Milus Banister, MD;  [facility];  Service: Endoscopy;  Laterality: N/A;  . ESOPHAGOGASTRODUODENOSCOPY (EGD) WITH PROPOFOL N/A 03/03/2018   Procedure: ESOPHAGOGASTRODUODENOSCOPY (EGD) WITH PROPOFOL;  Surgeon: Irving Copas., MD;  [facility];  Service: Gastroenterology;  Laterality: N/A;  . I & D EXTREMITY Right 12/15/2015   Procedure: IRRIGATION AND DEBRIDEMENT AND REVISION AMPUTATION RIGHT RING FINGER;  Surgeon: Leanora Cover, MD;  Location: [facility];  Service: Orthopedics;  Laterality: Right;       Family History  Problem Relation Age of Onset  .  Colon cancer Neg Hx   . Esophageal cancer Neg Hx   . Pancreatic cancer Neg Hx   . Stomach cancer Neg Hx   . Liver disease Neg Hx     Social History   Tobacco Use  . Smoking status: Former Smoker    Packs/day: 0.50    Years: 8.00    Pack years: 4.00    Types: Cigarettes    Quit date: 04/08/1990    Years since quitting: 29.1  . Smokeless tobacco: Never Used  Substance Use Topics  . Alcohol use: No  . Drug use: No    Home Medications Prior to Admission medications   Medication Sig Start Date End Date Taking? Authorizing Provider  insulin glargine (LANTUS) 100 UNIT/ML injection Inject 6 Units into the skin daily.    Yes [provider]  metFORMIN (GLUCOPHAGE) 500 MG tablet Take 500 mg by mouth daily. 05/07/19  Yes [provider]  nadolol (CORGARD) 20 MG tablet Take 1 tablet (20 mg total) by mouth daily. 06/03/19  Yes Milus Banister, MD  omeprazole (PRILOSEC) 40 MG capsule Take 1 capsule (40 mg total) by mouth daily before breakfast. 12/21/18  Yes Sagardia, Ines Bloomer, MD  spironolactone (ALDACTONE) 100 MG tablet TAKE 1 TABLET BY MOUTH DAILY Patient taking differently: Take 100 mg by mouth daily.  11/06/18  Yes Milus Banister, MD  tenofovir (VIREAD) 300 MG tablet Take 1 tablet (300 mg total) by mouth daily. 04/12/19  Yes Golden Circle, FNP  benzonatate (TESSALON) 100 MG capsule Take 1 capsule (100 mg total) by mouth every 8 (eight) hours as needed for cough. 06/08/19   Corena Herter, PA-C  lactulose (CHRONULAC) 10 GM/15ML solution Take 45 mLs (30 g total) by mouth daily. Patient not taking: Reported on 06/08/2019 05/14/18   Horald Pollen, MD  levocetirizine (XYZAL) 5 MG tablet TAKE 1 TABLET BY MOUTH EVERY EVENING Patient not taking: Reported on 06/08/2019 02/09/18   Rutherford Guys, MD  linagliptin (TRADJENTA) 5 MG TABS tablet Take 1 tablet (5 mg total) by mouth daily. 05/14/18 08/12/18  Horald Pollen, MD  sucralfate (CARAFATE) 1 GM/10ML suspension Take 10  mLs (1 g total) by mouth 4 (four) times daily. Take between meals and at bedtime. Patient not taking: Reported on 06/08/2019 05/14/18   Horald Pollen, MD    Allergies    Patient has no known allergies.  Review of Systems   Review of Systems  All other systems reviewed and are negative.   Physical Exam Updated Vital Signs BP (!) 117/58   Pulse 68   Temp 98.5 F (36.9 C) (Oral)   Resp 16   Ht 5' 3"  (1.6 m)   Wt 57.2 kg   SpO2 98%   BMI 22.32 kg/m   Physical Exam Vitals and nursing note reviewed. Exam conducted with a chaperone present.  Constitutional:      General: He is not in acute distress.    Appearance: Normal appearance. He is not diaphoretic.  HENT:     Head: Normocephalic and atraumatic.  Eyes:     General: No scleral icterus.  Conjunctiva/sclera: Conjunctivae normal.  Cardiovascular:     Rate and Rhythm: Normal rate and regular rhythm.     Pulses: Normal pulses.     Heart sounds: Normal heart sounds.  Pulmonary:     Effort: Pulmonary effort is normal. No respiratory distress.     Breath sounds: Normal breath sounds.  Abdominal:     Comments: Soft, nondistended.  Mild to moderate TTP in RUQ.  Negative Murphy sign.  Mild TTP in epigastrium, as well.  No TTP elsewhere.  No guarding.  No overlying skin changes.  No mass appreciated.  Normoactive bowel sounds.  Musculoskeletal:        General: Normal range of motion.     Cervical back: Normal range of motion and neck supple. No rigidity.     Right lower leg: No edema.     Left lower leg: No edema.  Skin:    General: Skin is dry.     Capillary Refill: Capillary refill takes less than 2 seconds.  Neurological:     Mental Status: He is alert and oriented to person, place, and time.     GCS: GCS eye subscore is 4. GCS verbal subscore is 5. GCS motor subscore is 6.  Psychiatric:        Mood and Affect: Mood normal.        Behavior: Behavior normal.        Thought Content: Thought content normal.      ED Results / Procedures / Treatments   Labs (all labs ordered are listed, but only abnormal results are displayed) Labs Reviewed  CBC WITH DIFFERENTIAL/PLATELET - Abnormal; Notable for the following components:      Result Value   WBC 1.8 (*)    RBC 3.60 (*)    Hemoglobin 12.0 (*)    HCT 35.0 (*)    Platelets 20 (*)    Neutro Abs 1.0 (*)    Lymphs Abs 0.6 (*)    All other components within normal limits  COMPREHENSIVE METABOLIC PANEL - Abnormal; Notable for the following components:   Glucose, Bld 162 (*)    Total Bilirubin 2.8 (*)    All other components within normal limits  LIPASE, BLOOD    EKG EKG Interpretation  Date/Time:  Tuesday June 08 2019 07:41:02 EST Ventricular Rate:  67 PR Interval:    QRS Duration: 102 QT Interval:  427 QTC Calculation: 451 R Axis:   78 Text Interpretation: Sinus rhythm No significant change since last tracing Confirmed by Virgel Manifold [phone]) on 06/08/2019 8:40:12 AM   Radiology DG Chest 2 View  Result Date: 06/08/2019 CLINICAL DATA:  Cough and intermittent chest pain since Friday. EXAM: CHEST - 2 VIEW COMPARISON:  Chest x-ray dated December 09, 2018. FINDINGS: The heart size and mediastinal contours are within normal limits. Normal pulmonary vascularity. Chronic pleural thickening and volume loss in the left hemithorax, similar to prior study. Unchanged chronic scarring and nodularity in both lungs. No focal consolidation, pleural effusion, or pneumothorax. No acute osseous abnormality. IMPRESSION: Stable chronic changes without active cardiopulmonary disease. Electronically Signed   By: Titus Dubin M.D.   On: 06/08/2019 09:50   US Abdomen Limited  Result Date: 06/08/2019 CLINICAL DATA:  Onset right upper quadrant pain 06/04/2019 in a patient with cirrhosis secondary to hepatitis C. EXAM: ULTRASOUND ABDOMEN LIMITED RIGHT UPPER QUADRANT COMPARISON:  Right upper quadrant ultrasound 01/21/2019. FINDINGS: Gallbladder: There are no  stones or pericholecystic fluid. The gallbladder wall is mildly thickened, likely due to  cirrhosis. Sonographer reports negative Murphy's sign. Common bile duct: Diameter: 0.3 cm Liver: The liver appears shrunken with a nodular border. No focal lesion. Portal vein is patent on color Doppler imaging with normal direction of blood flow towards the liver. The portal vein is prominent 1.4 cm. The umbilical vein is recanalized. Other: None. IMPRESSION: No acute abnormality.  Negative for gallstones. Cirrhotic liver without focal lesion. Blood flow in the portal vein is in the normal direction but the vein is dilated and there is recanalization of the umbilical vein compatible with portal venous hypertension. Electronically Signed   By: Inge Rise M.D.   On: 06/08/2019 09:56    Procedures Procedures (including critical care time)  Medications Ordered in ED Medications  alum & mag hydroxide-simeth (MAALOX/MYLANTA) 200-200-20 MG/5ML suspension 30 mL (30 mLs Oral Given 06/08/19 1050)    ED Course  I have reviewed the triage vital signs and the nursing notes.  Pertinent labs & imaging results that were available during my care of the patient were reviewed by me and considered in my medical decision making (see chart for details).    MDM Rules/Calculators/A&P                      Patient's reported chest discomfort worse at night and associated with cough that is described as a "burning" sensation is consistent with his history of GERD.  No exertional chest pain or shortness of breath symptoms otherwise concerning for ACS.  Patient's CBC is significant for pancytopenia, however consistent with patient's baseline.  He has excellent outpatient follow-up with infectious disease in addition to his primary care provider who he sees regularly.  His CMP demonstrates an elevated total bilirubin, however again consistent with patient's baseline labs.  Lipase WNL.  I personally reviewed patient's chest x-ray  which did not demonstrate any acute cardiopulmonary findings.  Ultrasound of right upper quadrant obtained showed his cirrhotic liver, however no acute abnormalities and otherwise unremarkable.   On reevaluation, patient feels as though his chest burning discomfort has been alleviated by the Maalox provided here in the ED.  Evidently he had his omeprazole discontinued in the past month and daughter agrees that this is likely contributing factor.  Given patient's cough that is worse at night with associated chest discomfort, will prescribe Tessalon Perles, however suspect that is more related to reflux disease.  Strongly encouraged him to obtain Maalox over-the-counter for abortive therapy of his GERD symptoms and educated them on lifestyle modifications.  I do not want to restart him on a PPI as his medication was discontinued for a particular reason.  Instead, encourage him to follow-up with his PCP in ID specialist to discuss today's encounter.  Discussed strict ED return precautions with patient as well as with his daughter.  All of the evaluation and work-up results were discussed with the patient and any family at bedside. They were provided opportunity to ask any additional questions and have none at this time. They have expressed understanding of verbal discharge instructions as well as return precautions and are agreeable to the plan.   His daughter was used as a Optometrist for the entire encounter today.  Final Clinical Impression(s) / ED Diagnoses Final diagnoses:  Gastroesophageal reflux disease, unspecified whether esophagitis present    Rx / DC Orders ED Discharge Orders         Ordered    benzonatate (TESSALON) 100 MG capsule  Every 8 hours PRN     06/08/19 1121  Corena Herter, PA-C 06/08/19 1122    Virgel Manifold, MD 06/09/19 [id number]

## 2019-06-08 NOTE — ED Notes (Signed)
PLT 20 reported to Dr. Wilson Singer

## 2019-06-08 NOTE — Discharge Instructions (Addendum)
Please follow-up with your primary care provider as well as your infectious disease specialist regarding today's encounter.  You will need to be established with a liver specialist at [facility].    Please read up on lifestyle modifications for GERD.  I have attached an attachment on food choices, however I advise that you keep a food journal.    Return to the ED or seek immediate medical attention for any new or worsening symptoms.

## 2019-06-08 NOTE — ED Notes (Signed)
Pt speaks [place] and unable to use  Phone and [place] interpreter services, d/t inability to translate that language.  Dimas Aguas called to interpret at this time with no answer.  Daughter also called and able to give some info.  2-3 NOC hx of cough which has worsened and has pain where he has coughed, CP.  Hx of Cirrhosis and c/o pain and irritation to liver when coughing.  Pt alert and oriented, in NAD.

## 2019-06-08 NOTE — ED Notes (Signed)
Pt to US.

## 2019-06-08 NOTE — ED Notes (Signed)
Dc instructions given over the phone ot daughter by PA.  Pt leaving by POV.

## 2019-06-08 NOTE — ED Triage Notes (Signed)
Pt reports intermittent chest pain and cough since Friday. Denies fever or SOB. Pain 2/10 at this time. Pt ambulatory, resp e.u

## 2019-06-10 ENCOUNTER — Other Ambulatory Visit: Payer: Self-pay

## 2019-06-10 MED ORDER — NADOLOL 20 MG PO TABS: 20.0000 mg | ORAL_TABLET | Freq: Every day | ORAL | 1 refills | Status: DC

## 2019-06-17 ENCOUNTER — Other Ambulatory Visit: Payer: Self-pay | Admitting: Family

## 2019-06-25 ENCOUNTER — Other Ambulatory Visit: Payer: Self-pay

## 2019-06-25 ENCOUNTER — Encounter (HOSPITAL_COMMUNITY): Payer: Self-pay | Admitting: *Deleted

## 2019-06-25 ENCOUNTER — Ambulatory Visit: Payer: 59 | Admitting: Emergency Medicine

## 2019-06-25 ENCOUNTER — Emergency Department (HOSPITAL_COMMUNITY)
Admission: EM | Admit: 2019-06-25 | Discharge: 2019-06-26 | Disposition: A | Discharge status: Home / Self Care | Payer: 59 | Attending: Emergency Medicine | Admitting: Emergency Medicine

## 2019-06-25 DIAGNOSIS — Z794 Long term (current) use of insulin: Secondary | ICD-10-CM | POA: Diagnosis not present

## 2019-06-25 DIAGNOSIS — K7460 Unspecified cirrhosis of liver: Secondary | ICD-10-CM | POA: Diagnosis not present

## 2019-06-25 DIAGNOSIS — J189 Pneumonia, unspecified organism: Secondary | ICD-10-CM

## 2019-06-25 DIAGNOSIS — R05 Cough: Secondary | ICD-10-CM | POA: Diagnosis present

## 2019-06-25 DIAGNOSIS — E119 Type 2 diabetes mellitus without complications: Secondary | ICD-10-CM | POA: Diagnosis not present

## 2019-06-25 DIAGNOSIS — Z87891 Personal history of nicotine dependence: Secondary | ICD-10-CM | POA: Diagnosis not present

## 2019-06-25 DIAGNOSIS — J181 Lobar pneumonia, unspecified organism: Secondary | ICD-10-CM | POA: Diagnosis not present

## 2019-06-25 NOTE — ED Triage Notes (Signed)
Pt speaks [place]. Spoke with pt daughter via phone,  [phone], liam. She says pt has had a cough, no fevers.

## 2019-06-26 ENCOUNTER — Other Ambulatory Visit (HOSPITAL_COMMUNITY): Payer: 59

## 2019-06-26 ENCOUNTER — Emergency Department (HOSPITAL_COMMUNITY): Payer: 59

## 2019-06-26 MED ORDER — ALBUTEROL SULFATE HFA 108 (90 BASE) MCG/ACT IN AERS
2.0000 | INHALATION_SPRAY | Freq: Four times a day (QID) | RESPIRATORY_TRACT | Status: DC | PRN
  Administered 2019-06-26: 2 via RESPIRATORY_TRACT
  Filled 2019-06-26: qty 6.7

## 2019-06-26 MED ORDER — AMOXICILLIN-POT CLAVULANATE 875-125 MG PO TABS: 1.0000 | ORAL_TABLET | Freq: Two times a day (BID) | ORAL | 0 refills | Status: DC

## 2019-06-26 MED ORDER — BENZONATATE 100 MG PO CAPS: 200.0000 mg | ORAL_CAPSULE | Freq: Two times a day (BID) | ORAL | 0 refills | Status: DC | PRN

## 2019-06-26 MED ORDER — AMOXICILLIN-POT CLAVULANATE 875-125 MG PO TABS
1.0000 | ORAL_TABLET | Freq: Once | ORAL | Status: AC
  Administered 2019-06-26: 1 via ORAL
  Filled 2019-06-26: qty 1

## 2019-06-26 NOTE — Discharge Instructions (Signed)
Take antibiotics as directed.  This will treat the pneumonia seen on your chest x-ray.  Return if your symptoms worsen or if you have fever or shortness of breath.

## 2019-06-26 NOTE — ED Notes (Signed)
Patient verbalizes understanding of discharge instructions. Opportunity for questioning and answers were provided. Armband removed by staff, pt discharged from ED. Pt. ambulatory and discharged home.  

## 2019-06-26 NOTE — ED Provider Notes (Signed)
[facility] EMERGENCY DEPARTMENT Provider Note   CSN: [id number] Arrival date & time: 06/25/19  2337     History Chief Complaint  Patient presents with  . Cough    Jose Snow is a 49 y.o. male.  Patient presents to the emergency department with a chief complaint of cough.  He states that he has had a cough for the past 2 weeks.  He states that it is productive.  He denies any fever.  He denies shortness of breath.  He states that he was seen recently for reflux.  He has tried taking Best boy for the cough with no significant relief.  The history is provided by the patient. No language interpreter was used.       Past Medical History:  Diagnosis Date  . Cirrhosis ([place])   . Diabetes mellitus ([place])   . Hepatitis B   . Liver cirrhosis secondary to NASH ([place])   . Lymphocytic colitis   . Pancytopenia ([place])   . 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     Patient Active Problem List   Diagnosis Date Noted  . Pancytopenia ([place]) 12/03/2017  . Abnormal echocardiogram 12/03/2017  . New onset type 2 diabetes mellitus ([place]) 12/03/2017  . History of hepatitis B 12/03/2017  . Esophageal varices without bleeding ([place])   . Heart murmur 08/01/2017  . Hepatitis B 02/17/2015  . 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 Thrombocytopenia ([place]) 11/25/2014  . History of TB (tuberculosis) 07/16/2010    Past Surgical History:  Procedure Laterality Date  . BIOPSY  10/23/2017   Procedure: BIOPSY;  Surgeon: Milus Banister, MD;  Location: [facility];  Service: Endoscopy;;  . BIOPSY  03/03/2018   Procedure: BIOPSY;  Surgeon: Irving Copas., MD;  Location: [place];  Service: Gastroenterology;;  . COLONOSCOPY WITH PROPOFOL N/A 10/23/2017   Procedure: COLONOSCOPY WITH PROPOFOL;  Surgeon: Milus Banister, MD;  Location: [facility];  Service: Endoscopy;  Laterality: N/A;  . COMPLEX WOUND CLOSURE Right 12/15/2015   Procedure: COMPLEX WOUND CLOSURE;  Surgeon: Leanora Cover, MD;  Location: [place];  Service: Orthopedics;  Laterality: Right;  . ESOPHAGOGASTRODUODENOSCOPY (EGD) WITH PROPOFOL N/A 10/23/2017   Procedure: ESOPHAGOGASTRODUODENOSCOPY (EGD) WITH PROPOFOL;  Surgeon: Milus Banister, MD;  [facility];  Service: Endoscopy;  Laterality: N/A;  . ESOPHAGOGASTRODUODENOSCOPY (EGD) WITH PROPOFOL N/A 03/03/2018   Procedure: ESOPHAGOGASTRODUODENOSCOPY (EGD) WITH PROPOFOL;  Surgeon: Rush Landmark Telford Nab., MD;  Location: [place];  Service: Gastroenterology;  Laterality: N/A;  . I & D EXTREMITY Right 12/15/2015   Procedure: IRRIGATION AND DEBRIDEMENT AND REVISION AMPUTATION RIGHT RING FINGER;  Surgeon: Leanora Cover, MD;  Location: [place];  Service: Orthopedics;  Laterality: Right;       Family History  Problem Relation Age of Onset  . Colon cancer Neg Hx   . Esophageal cancer Neg Hx   . Pancreatic cancer Neg Hx   . Stomach cancer Neg Hx   . Liver disease Neg Hx     Social History   Tobacco Use  . Smoking status: Former Smoker    Packs/day: 0.50    Years: 8.00    Pack years: 4.00    Types: Cigarettes    Quit date: 04/08/1990    Years since quitting: 29.2  . Smokeless tobacco: Never Used  Substance Use Topics  . Alcohol use: No  . Drug use: No    Home Medications Prior to  Admission medications   Medication Sig Start Date End Date Taking? Authorizing Provider  benzonatate (TESSALON) 100 MG capsule Take 1 capsule (100 mg total) by mouth every 8 (eight) hours as needed for cough. 06/08/19   Corena Herter, PA-C  insulin glargine (LANTUS) 100 UNIT/ML injection Inject 6 Units into the skin daily.     [provider]  lactulose (CHRONULAC) 10 GM/15ML solution Take 45 mLs (30 g total) by mouth daily. Patient not taking: Reported on 06/08/2019 05/14/18   Horald Pollen, MD  levocetirizine (XYZAL) 5 MG tablet TAKE 1 TABLET BY MOUTH EVERY EVENING Patient not taking: Reported on  06/08/2019 02/09/18   Rutherford Guys, MD  linagliptin (TRADJENTA) 5 MG TABS tablet Take 1 tablet (5 mg total) by mouth daily. 05/14/18 08/12/18  Horald Pollen, MD  metFORMIN (GLUCOPHAGE) 500 MG tablet Take 500 mg by mouth daily. 05/07/19   [provider]  nadolol (CORGARD) 20 MG tablet Take 1 tablet (20 mg total) by mouth daily. 06/10/19   Milus Banister, MD  omeprazole (PRILOSEC) 40 MG capsule Take 1 capsule (40 mg total) by mouth daily before breakfast. 12/21/18   Horald Pollen, MD  spironolactone (ALDACTONE) 100 MG tablet TAKE 1 TABLET BY MOUTH DAILY Patient taking differently: Take 100 mg by mouth daily.  11/06/18   Milus Banister, MD  sucralfate (CARAFATE) 1 GM/10ML suspension Take 10 mLs (1 g total) by mouth 4 (four) times daily. Take between meals and at bedtime. Patient not taking: Reported on 06/08/2019 05/14/18   Horald Pollen, MD  tenofovir (VIREAD) 300 MG tablet TAKE 1 TABLET BY MOUTH DAILY 06/18/19   Golden Circle, FNP    Allergies    Patient has no known allergies.  Review of Systems   Review of Systems  Constitutional: Negative for chills and fever.  Respiratory: Positive for cough. Negative for chest tightness and shortness of breath.   Gastrointestinal:       Reflux    Physical Exam Updated Vital Signs BP (!) 109/55 (BP Location: Right Arm)   Pulse (!) 52   Temp (!) 97.3 F (36.3 C)   Resp 16   SpO2 100%   Physical Exam Vitals and nursing note reviewed.  Constitutional:      Appearance: He is well-developed.  HENT:     Head: Normocephalic and atraumatic.  Eyes:     Conjunctiva/sclera: Conjunctivae normal.  Cardiovascular:     Rate and Rhythm: Normal rate and regular rhythm.     Heart sounds: No murmur.  Pulmonary:     Effort: Pulmonary effort is normal. No respiratory distress.     Breath sounds: Rales present.     Comments: Left lower lobe rales Abdominal:     Palpations: Abdomen is soft.     Tenderness: There is no  abdominal tenderness.  Musculoskeletal:        General: Normal range of motion.     Cervical back: Neck supple.  Skin:    General: Skin is warm and dry.  Neurological:     Mental Status: He is alert and oriented to person, place, and time.  Psychiatric:        Mood and Affect: Mood normal.        Behavior: Behavior normal.     ED Results / Procedures / Treatments   Labs (all labs ordered are listed, but only abnormal results are displayed) Labs Reviewed - No data to display  EKG None  Radiology  DG Chest Portable 1 View  Result Date: 06/26/2019 CLINICAL DATA:  Cough EXAM: PORTABLE CHEST 1 VIEW COMPARISON:  06/08/2019 FINDINGS: Hyperinflation of the lungs. Airspace disease in the left lower lobe concerning for pneumonia. Left effusion. Heart is normal size. No acute bony abnormality. IMPRESSION: Left lower lobe airspace opacity concerning for pneumonia. Layering left effusion. Electronically Signed   By: Rolm Baptise M.D.   On: 06/26/2019 00:28    Procedures Procedures (including critical care time)  Medications Ordered in ED Medications  albuterol (VENTOLIN HFA) 108 (90 Base) MCG/ACT inhaler 2 puff (has no administration in time range)  amoxicillin-clavulanate (AUGMENTIN) 875-125 MG per tablet 1 tablet (has no administration in time range)    ED Course  I have reviewed the triage vital signs and the nursing notes.  Pertinent labs & imaging results that were available during my care of the patient were reviewed by me and considered in my medical decision making (see chart for details).    MDM Rules/Calculators/A&P                      Patient is a well-appearing 49 year old male.  Vital signs are stable.  Patient complaining of cough x2 weeks.  Chest x-ray shows left lower lobe opacity concerning for pneumonia and layering effusion.  I will treat the patient with antibiotics.  He is not hypoxic.  He is nontoxic-appearing.  Based on the patient's well appearance, I do not  think that any additional laboratory work-up or imaging is indicated tonight.  Using most recent BUN, curb 65 is 0, I think that the patient is low risk for outpatient treatment.  I have given him strict return precautions.  Patient understands and agrees with the plan.  Final Clinical Impression(s) / ED Diagnoses Final diagnoses:  Community acquired pneumonia of left lower lobe of lung    Rx / DC Orders ED Discharge Orders         Ordered    amoxicillin-clavulanate (AUGMENTIN) 875-125 MG tablet  Every 12 hours     06/26/19 0348    benzonatate (TESSALON) 100 MG capsule  2 times daily PRN     06/26/19 0348           Montine Circle, PA-C 06/26/19 0430    Palumbo, April, MD 06/29/19 2305

## 2019-07-20 ENCOUNTER — Other Ambulatory Visit: Payer: Self-pay

## 2019-07-20 ENCOUNTER — Ambulatory Visit (HOSPITAL_COMMUNITY)
Admission: EM | Admit: 2019-07-20 | Discharge: 2019-07-20 | Disposition: A | Discharge status: Home / Self Care | Payer: 59 | Attending: Family Medicine | Admitting: Family Medicine

## 2019-07-20 ENCOUNTER — Ambulatory Visit (INDEPENDENT_AMBULATORY_CARE_PROVIDER_SITE_OTHER): Payer: 59

## 2019-07-20 DIAGNOSIS — J449 Chronic obstructive pulmonary disease, unspecified: Secondary | ICD-10-CM | POA: Insufficient documentation

## 2019-07-20 DIAGNOSIS — Z794 Long term (current) use of insulin: Secondary | ICD-10-CM | POA: Diagnosis not present

## 2019-07-20 DIAGNOSIS — D61818 Other pancytopenia: Secondary | ICD-10-CM | POA: Diagnosis not present

## 2019-07-20 DIAGNOSIS — Z79899 Other long term (current) drug therapy: Secondary | ICD-10-CM | POA: Insufficient documentation

## 2019-07-20 DIAGNOSIS — R05 Cough: Secondary | ICD-10-CM | POA: Insufficient documentation

## 2019-07-20 DIAGNOSIS — J90 Pleural effusion, not elsewhere classified: Secondary | ICD-10-CM | POA: Diagnosis not present

## 2019-07-20 DIAGNOSIS — I8500 Esophageal varices without bleeding: Secondary | ICD-10-CM | POA: Insufficient documentation

## 2019-07-20 DIAGNOSIS — R6883 Chills (without fever): Secondary | ICD-10-CM | POA: Diagnosis not present

## 2019-07-20 DIAGNOSIS — K7460 Unspecified cirrhosis of liver: Secondary | ICD-10-CM | POA: Diagnosis not present

## 2019-07-20 DIAGNOSIS — Z87891 Personal history of nicotine dependence: Secondary | ICD-10-CM | POA: Insufficient documentation

## 2019-07-20 DIAGNOSIS — R059 Cough, unspecified: Secondary | ICD-10-CM

## 2019-07-20 DIAGNOSIS — U071 COVID-19: Secondary | ICD-10-CM | POA: Insufficient documentation

## 2019-07-20 MED ORDER — ONDANSETRON 4 MG PO TBDP: 4.0000 mg | ORAL_TABLET | Freq: Three times a day (TID) | ORAL | 0 refills | Status: DC | PRN

## 2019-07-20 MED ORDER — BENZONATATE 200 MG PO CAPS: 200.0000 mg | ORAL_CAPSULE | Freq: Three times a day (TID) | ORAL | 0 refills | Status: AC | PRN

## 2019-07-20 NOTE — ED Triage Notes (Signed)
Pt c/o cough, chills for several days. States cough is forceful at times inducing vomiting. Denies sore throat, HA, abdom pain, n/v/d.

## 2019-07-20 NOTE — ED Provider Notes (Signed)
Jose Snow    CSN: [id number] Arrival date & time: 07/20/19  0930      History   Chief Complaint Chief Complaint  Patient presents with  . Cough  . Chills    HPI [name] is a 49 y.o. male history of DM type II, liver cirrhosis, presenting today for evaluation of a cough.  Patient has had a cough and chills for approximately 2 to 3 days.  Coughing has led to multiple episodes of posttussive emesis.  Denies sore throat, sinus congestion.  Denies abdominal pain nausea or vomiting at rest without cough.  Denies any known fevers.  Did have left lower lobe pneumonia on 3/19 and completed course of antibiotics.  HPI  Past Medical History:  Diagnosis Date  . Cirrhosis ([place])   . Diabetes mellitus ([place])   . Hepatitis B   . Liver cirrhosis secondary to NASH ([place])   . Lymphocytic colitis   . Pancytopenia ([place])   . 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     Patient Active Problem List   Diagnosis Date Noted  . Pancytopenia ([place]) 12/03/2017  . Abnormal echocardiogram 12/03/2017  . New onset type 2 diabetes mellitus ([place]) 12/03/2017  . History of hepatitis B 12/03/2017  . Esophageal varices without bleeding ([place])   . Heart murmur 08/01/2017  . Hepatitis B 02/17/2015  . 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 Thrombocytopenia ([place]) 11/25/2014  . History of TB (tuberculosis) 07/16/2010    Past Surgical History:  Procedure Laterality Date  . BIOPSY  10/23/2017   Procedure: BIOPSY;  Surgeon: Milus Banister, MD;  Location: [facility];  Service: Endoscopy;;  . BIOPSY  03/03/2018   Procedure: BIOPSY;  Surgeon: Irving Copas., MD;  Location: [place];  Service: Gastroenterology;;  . COLONOSCOPY WITH PROPOFOL N/A 10/23/2017   Procedure: COLONOSCOPY WITH PROPOFOL;  Surgeon: Milus Banister, MD;  [facility];  Service: Endoscopy;  Laterality: N/A;  . COMPLEX WOUND CLOSURE Right 12/15/2015   Procedure: COMPLEX  WOUND CLOSURE;  Surgeon: Leanora Cover, MD;  Location: [place];  Service: Orthopedics;  Laterality: Right;  . ESOPHAGOGASTRODUODENOSCOPY (EGD) WITH PROPOFOL N/A 10/23/2017   Procedure: ESOPHAGOGASTRODUODENOSCOPY (EGD) WITH PROPOFOL;  Surgeon: Milus Banister, MD;  [facility];  Service: Endoscopy;  Laterality: N/A;  . ESOPHAGOGASTRODUODENOSCOPY (EGD) WITH PROPOFOL N/A 03/03/2018   Procedure: ESOPHAGOGASTRODUODENOSCOPY (EGD) WITH PROPOFOL;  Surgeon: Rush Landmark Telford Nab., MD;  Location: [facility] [place];  Service: Gastroenterology;  Laterality: N/A;  . I & D EXTREMITY Right 12/15/2015   Procedure: IRRIGATION AND DEBRIDEMENT AND REVISION AMPUTATION RIGHT RING FINGER;  Surgeon: Leanora Cover, MD;  Location: [facility];  Service: Orthopedics;  Laterality: Right;       Home Medications    Prior to Admission medications   Medication Sig Start Date End Date Taking? Authorizing Provider  B-D UF III MINI PEN NEEDLES 31G X 5 MM MISC daily. use as directed 06/21/19   [provider]  benzonatate (TESSALON) 200 MG capsule Take 1 capsule (200 mg total) by mouth 3 (three) times daily as needed for up to 7 days for cough. 07/20/19 07/27/19  Aaliya Maultsby C, PA-C  insulin glargine (LANTUS) 100 UNIT/ML injection Inject 6 Units into the skin daily.     [provider]  lactulose (CHRONULAC) 10 GM/15ML solution Take 45 mLs (30 g total) by mouth daily. Patient not taking: Reported on 06/08/2019 05/14/18   Horald Pollen,  MD  levocetirizine (XYZAL) 5 MG tablet TAKE 1 TABLET BY MOUTH EVERY EVENING Patient not taking: Reported on 06/08/2019 02/09/18   Rutherford Guys, MD  linagliptin (TRADJENTA) 5 MG TABS tablet Take 1 tablet (5 mg total) by mouth daily. 05/14/18 08/12/18  Horald Pollen, MD  metFORMIN (GLUCOPHAGE) 500 MG tablet Take 500 mg by mouth daily. 05/07/19   [provider]  nadolol (CORGARD) 20 MG tablet Take 1 tablet (20 mg total) by mouth daily. 06/10/19   Milus Banister, MD    omeprazole (PRILOSEC) 40 MG capsule Take 1 capsule (40 mg total) by mouth daily before breakfast. 12/21/18   Horald Pollen, MD  ondansetron (ZOFRAN ODT) 4 MG disintegrating tablet Take 1 tablet (4 mg total) by mouth every 8 (eight) hours as needed for nausea or vomiting. 07/20/19   Jaise Moser C, PA-C  spironolactone (ALDACTONE) 100 MG tablet TAKE 1 TABLET BY MOUTH DAILY Patient taking differently: Take 100 mg by mouth daily.  11/06/18   Milus Banister, MD  sucralfate (CARAFATE) 1 GM/10ML suspension Take 10 mLs (1 g total) by mouth 4 (four) times daily. Take between meals and at bedtime. Patient not taking: Reported on 06/08/2019 05/14/18   Horald Pollen, MD  tenofovir [place]) 300 MG tablet TAKE 1 TABLET BY MOUTH DAILY 06/18/19   Golden Circle, FNP    Family History Family History  Problem Relation Age of Onset  . Colon cancer Neg Hx   . Esophageal cancer Neg Hx   . Pancreatic cancer Neg Hx   . Stomach cancer Neg Hx   . Liver disease Neg Hx     Social History Social History   Tobacco Use  . Smoking status: Former Smoker    Packs/day: 0.50    Years: 8.00    Pack years: 4.00    Types: Cigarettes    Quit date: 04/08/1990    Years since quitting: 29.3  . Smokeless tobacco: Never Used  Substance Use Topics  . Alcohol use: No  . Drug use: No     Allergies   Patient has no known allergies.   Review of Systems Review of Systems  Constitutional: Positive for chills and fatigue. Negative for activity change, appetite change and fever.  HENT: Negative for congestion, ear pain, rhinorrhea, sinus pressure, sore throat and trouble swallowing.   Eyes: Negative for discharge and redness.  Respiratory: Positive for cough. Negative for chest tightness and shortness of breath.   Cardiovascular: Negative for chest pain.  Gastrointestinal: Positive for vomiting. Negative for abdominal pain, diarrhea and nausea.  Musculoskeletal: Negative for myalgias.  Skin: Negative  for rash.  Neurological: Negative for dizziness, light-headedness and headaches.     Physical Exam Triage Vital Signs ED Triage Vitals [07/20/19 1027]  Enc Vitals Group     BP 103/68     Pulse Rate 63     Resp 18     Temp 98.1 F (36.7 C)     Temp Source Oral     SpO2 99 %     Weight      Height      Head Circumference      Peak Flow      Pain Score 0     Pain Loc      Pain Edu?      Excl. in [place]?    No data found.  Updated Vital Signs BP 103/68 (BP Location: Left Arm)   Pulse 63   Temp 98.1  F (36.7 C) (Oral)   Resp 18   SpO2 99%   Visual Acuity Right Eye Distance:   Left Eye Distance:   Bilateral Distance:    Right Eye Near:   Left Eye Near:    Bilateral Near:     Physical Exam Vitals and nursing note reviewed.  Constitutional:      Appearance: He is well-developed.     Comments: No acute distress  HENT:     Head: Normocephalic and atraumatic.     Ears:     Comments: Bilateral TMs appear slightly opaque, but good cone of light, right TM appears to have TM rupture    Nose: Nose normal.     Mouth/Throat:     Comments: Oral mucosa pink and moist, no tonsillar enlargement or exudate. Posterior pharynx patent and nonerythematous, no uvula deviation or swelling. Normal phonation.  Eyes:     Conjunctiva/sclera: Conjunctivae normal.  Cardiovascular:     Rate and Rhythm: Normal rate.  Pulmonary:     Effort: Pulmonary effort is normal. No respiratory distress.     Comments: Breathing comfortably at rest, diminished breath sounds noted to left lower area with faint rales Abdominal:     General: There is no distension.  Musculoskeletal:        General: Normal range of motion.     Cervical back: Neck supple.  Skin:    General: Skin is warm and dry.  Neurological:     Mental Status: He is alert and oriented to person, place, and time.      UC Treatments / Results  Labs (all labs ordered are listed, but only abnormal results are displayed) Labs  Reviewed  SARS CORONAVIRUS 2 (TAT 6-24 HRS)    EKG   Radiology DG Chest 2 View  Result Date: 07/20/2019 CLINICAL DATA:  Worsening cough for 2 days, post-tussive emesis, cough since Saturday EXAM: CHEST - 2 VIEW COMPARISON:  06/26/2019 FINDINGS: Normal heart size, mediastinal contours, and pulmonary vascularity. Chronic scarring in the upper lobes bilaterally and at the LEFT base. Tiny LEFT pleural effusion, decreased. Underlying emphysematous changes. No definite acute infiltrate, pleural effusion or pneumothorax. Areas of slight scattered nodular prominence appear unchanged since 06/08/2019. No acute osseous findings. IMPRESSION: COPD changes with scarring at the apices and LEFT base. Decreased LEFT pleural effusion. No acute abnormalities. Electronically Signed   By: Lavonia Dana M.D.   On: 07/20/2019 11:01    Procedures Procedures (including critical care time)  Medications Ordered in UC Medications - No data to display  Initial Impression / Assessment and Plan / UC Course  I have reviewed the triage vital signs and the nursing notes.  Pertinent labs & imaging results that were available during my care of the patient were reviewed by me and considered in my medical decision making (see chart for details).     Chest x-ray with resolved pneumonia, slightly improved pleural effusion.  Covid PCR pending.  Vital signs stable.  Most likely viral etiology at this time and recommending symptomatic and supportive care with close monitoring.  Tessalon for cough.  May use Zofran as needed for nausea and vomiting, suspect vomiting most likely posttussive emesis rather than underlying GI upset.  Discussed strict return precautions. Patient verbalized understanding and is agreeable with plan.  Final Clinical Impressions(s) / UC Diagnoses   Final diagnoses:  Cough     Discharge Instructions     Chest xrray without pneumonia COVID test pending Use tessalon/benzonatate every 8 hours as  needed cough cough May use zofran for nausea/vomiting Honey and hot tea with lemon, ginger  Follow up if not improving or worsening    ED Prescriptions    Medication Sig Dispense Auth. Provider   ondansetron (ZOFRAN ODT) 4 MG disintegrating tablet Take 1 tablet (4 mg total) by mouth every 8 (eight) hours as needed for nausea or vomiting. 20 tablet Muhannad Bignell C, PA-C   benzonatate (TESSALON) 200 MG capsule Take 1 capsule (200 mg total) by mouth 3 (three) times daily as needed for up to 7 days for cough. 28 capsule Maxwel Meadowcroft, [place] C, PA-C     PDMP not reviewed this encounter.   Janith Lima, PA-C 07/20/19 1124

## 2019-07-20 NOTE — Discharge Instructions (Signed)
Chest xrray without pneumonia COVID test pending Use tessalon/benzonatate every 8 hours as needed cough cough May use zofran for nausea/vomiting Honey and hot tea with lemon, ginger  Follow up if not improving or worsening

## 2019-07-21 LAB — SARS CORONAVIRUS 2 (TAT 6-24 HRS): SARS Coronavirus 2: POSITIVE — AB

## 2019-07-22 ENCOUNTER — Other Ambulatory Visit (HOSPITAL_COMMUNITY): Payer: Self-pay | Admitting: Nurse Practitioner

## 2019-07-22 ENCOUNTER — Encounter: Payer: Self-pay | Admitting: Nurse Practitioner

## 2019-07-22 ENCOUNTER — Telehealth (HOSPITAL_COMMUNITY): Payer: Self-pay | Admitting: Nurse Practitioner

## 2019-07-22 ENCOUNTER — Ambulatory Visit (HOSPITAL_COMMUNITY)
Admission: RE | Admit: 2019-07-22 | Discharge: 2019-07-22 | Disposition: A | Discharge status: Home / Self Care | Payer: 59 | Source: Ambulatory Visit | Attending: Pulmonary Disease | Admitting: Pulmonary Disease

## 2019-07-22 DIAGNOSIS — U071 COVID-19: Secondary | ICD-10-CM

## 2019-07-22 MED ORDER — SODIUM CHLORIDE 0.9 % IV SOLN
Freq: Once | INTRAVENOUS | Status: AC
  Filled 2019-07-22: qty 700

## 2019-07-22 NOTE — Discharge Instructions (Signed)

## 2019-07-22 NOTE — Progress Notes (Signed)
I connected by phone with Jose Snow's friend, DPR on file, Jose Snow. She spoke directly to patient. Due to language barrier and lack of available interpreter, patient agreed to translation through [place]. We connected on 07/22/2019 at 10:15 AM to discuss the potential use of an new treatment for mild to moderate COVID-19 viral infection in non-hospitalized patients.  This patient is a 49 y.o. male that meets the FDA criteria for Emergency Use Authorization of bamlanivimab or casirivimab\imdevimab.  Has a (+) direct SARS-CoV-2 viral test result  Has mild or moderate COVID-19   Is ? 51 years of age and weighs ? 40 kg  Is NOT hospitalized due to COVID-19  Is NOT requiring oxygen therapy or requiring an increase in baseline oxygen flow rate due to COVID-19  Is within 10 days of symptom onset  Has at least one of the high risk factor(s) for progression to severe COVID-19 and/or hospitalization as defined in EUA.  Specific high risk criteria : Diabetes  I have spoken and communicated the following to the patient or parent/caregiver:  1. FDA has authorized the emergency use of bamlanivimab and casirivimab\imdevimab for the treatment of mild to moderate COVID-19 in adults and pediatric patients with positive results of direct SARS-CoV-2 viral testing who are 39 years of age and older weighing at least 40 kg, and who are at high risk for progressing to severe COVID-19 and/or hospitalization.  2. The significant known and potential risks and benefits of bamlanivimab and casirivimab\imdevimab, and the extent to which such potential risks and benefits are unknown.  3. Information on available alternative treatments and the risks and benefits of those alternatives, including clinical trials.  4. Patients treated with bamlanivimab and casirivimab\imdevimab should continue to self-isolate and use infection control measures (e.g., wear mask, isolate, social distance, avoid sharing personal items,  clean and disinfect "high touch" surfaces, and frequent handwashing) according to CDC guidelines.   5. The patient or parent/caregiver has the option to accept or refuse bamlanivimab or casirivimab\imdevimab .  After reviewing this information with the patient, the patient agreed to proceed with receiving the bamlanivimab-etesevimab infusion and will be provided a copy of the fact sheet prior to receiving the infusion. Due to language barrier, translation will be provided. Beckey Rutter, [place], AGNP-C [phone] ([place])

## 2019-07-22 NOTE — Telephone Encounter (Signed)
Called to Discuss with patient about Covid symptoms and the use of bamlanivimab, a monoclonal antibody infusion for those with mild to moderate Covid symptoms and at a high risk of hospitalization.     Pt is qualified for this infusion at the [facility] infusion center due to co-morbid conditions and/or a member of an at-risk group.     Patient primarily speaks [place]. I utilized [facility] interpreter services who says that they do not have [place] interpreters. [place] interpreter was not able to understand patient. Left message for daughter. Spoke to friends, Maple Mirza (Release on file per epic). They will speak to Mr. Clute and have him call me back.   Beckey Rutter, [place], AGNP-C [phone] ([place])

## 2019-07-22 NOTE — Progress Notes (Signed)
  Diagnosis: COVID-19  Physician:Dr Joya Gaskins  Procedure: [facility] Infusion Clinic Med: bamlanivimab\etesevimab infusion - Provided patient with bamlanimivab\etesevimab fact sheet for patients, parents and caregivers prior to infusion.  Complications: No immediate complications noted.  Discharge: Discharged home   Christella Noa Albarece 07/22/2019

## 2019-08-02 ENCOUNTER — Other Ambulatory Visit: Payer: Self-pay

## 2019-08-02 ENCOUNTER — Ambulatory Visit (HOSPITAL_COMMUNITY): Admission: EM | Admit: 2019-08-02 | Discharge: 2019-08-02 | Discharge status: Against Medical Advice | Payer: 59

## 2019-08-13 ENCOUNTER — Other Ambulatory Visit: Payer: Self-pay | Admitting: Gastroenterology

## 2019-08-19 ENCOUNTER — Other Ambulatory Visit: Payer: Self-pay | Admitting: *Deleted

## 2019-08-19 DIAGNOSIS — K219 Gastro-esophageal reflux disease without esophagitis: Secondary | ICD-10-CM

## 2019-08-19 MED ORDER — OMEPRAZOLE 40 MG PO CPDR: 40.0000 mg | DELAYED_RELEASE_CAPSULE | Freq: Every day | ORAL | 6 refills | Status: DC

## 2019-09-14 ENCOUNTER — Other Ambulatory Visit: Payer: Self-pay | Admitting: Gastroenterology

## 2019-09-28 ENCOUNTER — Other Ambulatory Visit: Payer: Self-pay | Admitting: Gastroenterology

## 2019-10-18 ENCOUNTER — Ambulatory Visit: Payer: 59 | Admitting: Family

## 2019-10-29 ENCOUNTER — Ambulatory Visit (INDEPENDENT_AMBULATORY_CARE_PROVIDER_SITE_OTHER): Payer: 59 | Admitting: Family

## 2019-10-29 ENCOUNTER — Encounter: Payer: Self-pay | Admitting: Family

## 2019-10-29 ENCOUNTER — Other Ambulatory Visit: Payer: Self-pay

## 2019-10-29 VITALS — BP 101/63 | HR 54 | Temp 97.6°F | Wt 117.0 lb

## 2019-10-29 DIAGNOSIS — Z8611 Personal history of tuberculosis: Secondary | ICD-10-CM

## 2019-10-29 DIAGNOSIS — B181 Chronic viral hepatitis B without delta-agent: Secondary | ICD-10-CM | POA: Diagnosis not present

## 2019-10-29 DIAGNOSIS — B1910 Unspecified viral hepatitis B without hepatic coma: Secondary | ICD-10-CM

## 2019-10-29 DIAGNOSIS — K7460 Unspecified cirrhosis of liver: Secondary | ICD-10-CM | POA: Diagnosis not present

## 2019-10-29 MED ORDER — TENOFOVIR DISOPROXIL FUMARATE 300 MG PO TABS: 300.0000 mg | ORAL_TABLET | Freq: Every day | ORAL | 5 refills | Status: DC

## 2019-10-29 NOTE — Addendum Note (Signed)
Addended by: Dolan Amen D on: 10/29/2019 11:02 AM   Modules accepted: Orders

## 2019-10-29 NOTE — Progress Notes (Signed)
Subjective:    Patient ID: Jose Snow, male    DOB: 18-Aug-1970, 49 y.o.   MRN: [id number]  Chief Complaint  Patient presents with  . Follow-up    Hep B     HPI:  Jose Snow is a 49 y.o. 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  Was [facility] at the time. Since his last office visit he was diagnosed with COVID-19 in April.Here today for routine follow up.   Mr,. Jose Snow continues to take his tenofovir daily as prescribed with no adverse side effects or missed doses since his last office visit.  Feeling well today.  Has upcoming appointment with gastroenterology in August.  Has yet to see [facility] for liver transplant evaluation.  Has been to the ED on several times/occasions with cough.  No fevers, chills, night sweats, or unexpected weight loss.  No Known Allergies    Outpatient Medications Prior to Visit  Medication Sig Dispense Refill  . B-D UF III MINI PEN NEEDLES 31G X 5 MM MISC daily. use as directed    . insulin glargine (LANTUS) 100 UNIT/ML injection Inject 6 Units into the skin daily.     Marland Kitchen levocetirizine (XYZAL) 5 MG tablet TAKE 1 TABLET BY MOUTH EVERY EVENING 30 tablet 0  . metFORMIN (GLUCOPHAGE) 500 MG tablet Take 500 mg by mouth daily.    . nadolol (CORGARD) 20 MG tablet TAKE 1 TABLET(20 MG) BY MOUTH DAILY 30 tablet 0  . omeprazole (PRILOSEC) 40 MG capsule Take 1 capsule (40 mg total) by mouth daily before breakfast. 30 capsule 6  . ondansetron (ZOFRAN ODT) 4 MG disintegrating tablet Take 1 tablet (4 mg total) by mouth every 8 (eight) hours as needed for nausea or vomiting. 20 tablet 0  . spironolactone (ALDACTONE) 100 MG tablet TAKE 1 TABLET BY MOUTH DAILY (Patient taking differently: Take 100 mg by mouth daily. ) 90 tablet 3  . tenofovir (VIREAD) 300 MG tablet TAKE 1 TABLET BY MOUTH DAILY 30 tablet 5  .  lactulose (CHRONULAC) 10 GM/15ML solution Take 45 mLs (30 g total) by mouth daily. (Patient not taking: Reported on 10/29/2019) 240 mL 3  . linagliptin (TRADJENTA) 5 MG TABS tablet Take 1 tablet (5 mg total) by mouth daily. 90 tablet 1  . sucralfate (CARAFATE) 1 GM/10ML suspension Take 10 mLs (1 g total) by mouth 4 (four) times daily. Take between meals and at bedtime. (Patient not taking: Reported on 10/29/2019) 420 mL 3   No facility-administered medications prior to visit.     Past Medical History:  Diagnosis Date  . Cirrhosis ([place])   . Diabetes mellitus ([place])   . Hepatitis B   . Liver cirrhosis secondary to NASH ([place])   . Lymphocytic colitis   . Pancytopenia ([place])   . 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      Past Surgical History:  Procedure Laterality Date  . BIOPSY  10/23/2017   Procedure: BIOPSY;  Surgeon: Milus Banister, MD;  Location: [facility];  Service: Endoscopy;;  . BIOPSY  03/03/2018   Procedure: BIOPSY;  Surgeon: Irving Copas., MD;  Location: [place];  Service: Gastroenterology;;  . COLONOSCOPY WITH PROPOFOL N/A 10/23/2017   Procedure: COLONOSCOPY WITH PROPOFOL;  Surgeon: Milus Banister, MD;  [facility]: [facility];  Service: Endoscopy;  Laterality: N/A;  .  COMPLEX WOUND CLOSURE Right 12/15/2015   Procedure: COMPLEX WOUND CLOSURE;  Surgeon: Leanora Cover, MD;  Location: [place];  Service: Orthopedics;  Laterality: Right;  . ESOPHAGOGASTRODUODENOSCOPY (EGD) WITH PROPOFOL N/A 10/23/2017   Procedure: ESOPHAGOGASTRODUODENOSCOPY (EGD) WITH PROPOFOL;  Surgeon: Milus Banister, MD;  [facility];  Service: Endoscopy;  Laterality: N/A;  . ESOPHAGOGASTRODUODENOSCOPY (EGD) WITH PROPOFOL N/A 03/03/2018   Procedure: ESOPHAGOGASTRODUODENOSCOPY (EGD) WITH PROPOFOL;  Surgeon: Rush Landmark Telford Nab., MD;  Location: [place];  Service: Gastroenterology;  Laterality: N/A;  . I & D EXTREMITY Right 12/15/2015   Procedure: IRRIGATION AND  DEBRIDEMENT AND REVISION AMPUTATION RIGHT RING FINGER;  Surgeon: Leanora Cover, MD;  Location: [place];  Service: Orthopedics;  Laterality: Right;       Review of Systems  Constitutional: Negative for chills, diaphoresis, fatigue and fever.  Respiratory: Negative for cough, chest tightness, shortness of breath and wheezing.   Cardiovascular: Negative for chest pain.  Gastrointestinal: Negative for abdominal distention, abdominal pain, constipation, diarrhea, nausea and vomiting.  Neurological: Negative for weakness and headaches.  Hematological: Does not bruise/bleed easily.      Objective:    BP (!) 101/63   Pulse 54   Temp 97.6 F (36.4 C) (Oral)   Wt 117 lb (53.1 kg)   BMI 20.73 kg/m  Nursing note and vital signs reviewed.  Physical Exam Constitutional:      General: He is not in acute distress.    Appearance: He is well-developed.  Cardiovascular:     Rate and Rhythm: Normal rate and regular rhythm.     Heart sounds: Normal heart sounds. No murmur heard.  No friction rub. No gallop.   Pulmonary:     Effort: Pulmonary effort is normal. No respiratory distress.     Breath sounds: Normal breath sounds. No wheezing or rales.  Chest:     Chest wall: No tenderness.  Abdominal:     General: Bowel sounds are normal. There is no distension.     Palpations: Abdomen is soft. There is no mass.     Tenderness: There is no abdominal tenderness. There is no guarding or rebound.  Skin:    General: Skin is warm and dry.  Neurological:     Mental Status: He is alert and oriented to person, place, and time.  Psychiatric:        Behavior: Behavior normal.        Thought Content: Thought content normal.        Judgment: Judgment normal.      Depression screen [facility] 2/9 10/29/2019 04/12/2019 12/21/2018 08/20/2018 05/14/2018  Decreased Interest 0 0 0 0 0  Down, Depressed, Hopeless 0 0 0 0 0  PHQ - 2 Score 0 0 0 0 0       Assessment & Plan:    Patient Active Problem List   Diagnosis  Date Noted  . Hepatitis B 02/17/2015    Priority: High  . Cirrhosis of liver due to hepatitis B ([place]) 11/25/2014    Priority: High  . Pancytopenia ([place]) 12/03/2017  . Abnormal echocardiogram 12/03/2017  . New onset type 2 diabetes mellitus ([place]) 12/03/2017  . History of hepatitis B 12/03/2017  . Esophageal varices without bleeding ([place])   . Heart murmur 08/01/2017  . Thrombocytopenia ([place]) 11/25/2014  . History of TB (tuberculosis) 07/16/2010     Problem List Items Addressed This Visit      Digestive   Cirrhosis of liver due to hepatitis B [facility])    Mr.  Windt continues to take medication daily as prescribed and followed by gastroenterology. Awaiting evaluation by Hepatology and possibly liver transplant. Continue medications with adjustments as needed per gastroenterology.       Relevant Orders   Hepatic function panel   Hepatitis B DNA, ultraquantitative, PCR   Hepatitis B surface antigen   Hepatitis B surface antibody,qualitative   Hepatitis delta antibody   Hepatitis B surface antibody,qualitative   Hepatitis B - Primary    Mr. Mckone has good adherence and tolerance to his medication regimen of tenofovir.  No signs/symptoms of flare today.  Has follow-up with gastroenterology at the end of August.  Check blood work today.  Continue current dose of tenofovir indefinitely.  Plan for follow-up in 6 months or sooner if needed.  Awaiting follow-up with hepatology for evaluation for liver transplant.       Relevant Medications   tenofovir (VIREAD) 300 MG tablet   Other Relevant Orders   Hepatic function panel   Hepatitis B DNA, ultraquantitative, PCR   Hepatitis B surface antigen   Hepatitis B surface antibody,qualitative   Hepatitis delta antibody   Hepatitis B surface antibody,qualitative     Other   History of TB (tuberculosis)    Mr. Klutz has no evidence of TB on most recent x-rays. Cough likely related to COPD. Recommend continued follow up with primary care and  possibly pulmonology.           I have changed Y K. Husby's tenofovir. I am also having him maintain his levocetirizine, insulin glargine, linagliptin, lactulose, sucralfate, spironolactone, metFORMIN, B-D UF III MINI PEN NEEDLES, ondansetron, omeprazole, and nadolol.   Meds ordered this encounter  Medications  . tenofovir (VIREAD) 300 MG tablet    Sig: Take 1 tablet (300 mg total) by mouth daily.    Dispense:  30 tablet    Refill:  5    Order Specific Question:   Supervising Provider    Answer:   Carlyle Basques [[id number]]     Follow-up: Return in about 6 months (around 04/30/2020).   Terri Piedra, MSN, FNP-C Nurse Practitioner [facility] [place] number: [phone]

## 2019-10-29 NOTE — Patient Instructions (Signed)
Nice to see you.  We will check your lab work today.  Continue to take your Tenofovir daily.   We will refill your medication today.  Follow up with [name] and [place].  Recommend discussing COPD treatment with Dr. Mitchel Honour.   It does not appear that you have any active tuberculosis based on X-ray findings.   Plan for follow up in 6 months or sooner if needed.

## 2019-10-29 NOTE — Assessment & Plan Note (Signed)
Jose Snow has good adherence and tolerance to his medication regimen of tenofovir.  No signs/symptoms of flare today.  Has follow-up with gastroenterology at the end of August.  Check blood work today.  Continue current dose of tenofovir indefinitely.  Plan for follow-up in 6 months or sooner if needed.  Awaiting follow-up with hepatology for evaluation for liver transplant.

## 2019-10-29 NOTE — Assessment & Plan Note (Deleted)
Jose Snow has good adherence and tolerance to his medication regimen of tenofovir.  No signs/symptoms of flare today.  Has follow-up with gastroenterology at the end of August.  Check blood work today.  Continue current dose of tenofovir indefinitely.  Plan for follow-up in 6 months or sooner if needed.  Awaiting follow-up with hepatology for evaluation for liver transplant.

## 2019-10-29 NOTE — Assessment & Plan Note (Signed)
Mr. Granderson has no evidence of TB on most recent x-rays. Cough likely related to COPD. Recommend continued follow up with primary care and possibly pulmonology.

## 2019-10-29 NOTE — Assessment & Plan Note (Signed)
Jose Snow continues to take medication daily as prescribed and followed by gastroenterology. Awaiting evaluation by Hepatology and possibly liver transplant. Continue medications with adjustments as needed per gastroenterology.

## 2019-11-03 LAB — HEPATIC FUNCTION PANEL
AG Ratio: 1.4 (calc) (ref 1.0–2.5)
ALT: 18 U/L (ref 9–46)
AST: 24 U/L (ref 10–35)
Albumin: 4 g/dL (ref 3.6–5.1)
Alkaline phosphatase (APISO): 61 U/L (ref 35–144)
Bilirubin, Direct: 0.5 mg/dL — ABNORMAL HIGH (ref 0.0–0.2)
Globulin: 2.8 g/dL (calc) (ref 1.9–3.7)
Indirect Bilirubin: 1.4 mg/dL (calc) — ABNORMAL HIGH (ref 0.2–1.2)
Total Bilirubin: 1.9 mg/dL — ABNORMAL HIGH (ref 0.2–1.2)
Total Protein: 6.8 g/dL (ref 6.1–8.1)

## 2019-11-03 LAB — HEPATITIS B SURFACE ANTIBODY,QUALITATIVE: Hep B S Ab: NONREACTIVE

## 2019-11-03 LAB — HEPATITIS B SURFACE ANTIGEN: Hepatitis B Surface Ag: REACTIVE — AB

## 2019-11-03 LAB — HEPATITIS DELTA ANTIBODY: Hepatitis D Ab, Total: NEGATIVE

## 2019-11-03 LAB — HEPATITIS B DNA, ULTRAQUANTITATIVE, PCR
Hepatitis B DNA (Calc): <1 Log IU/mL — ABNORMAL HIGH
Hepatitis B DNA: <10 IU/mL — ABNORMAL HIGH

## 2019-11-04 ENCOUNTER — Other Ambulatory Visit: Payer: Self-pay | Admitting: Gastroenterology

## 2019-11-05 NOTE — Telephone Encounter (Signed)
Please advise Sir, thank you. 

## 2019-11-15 ENCOUNTER — Ambulatory Visit (HOSPITAL_COMMUNITY)
Admission: EM | Admit: 2019-11-15 | Discharge: 2019-11-15 | Disposition: A | Discharge status: Home / Self Care | Payer: 59 | Attending: Internal Medicine | Admitting: Internal Medicine

## 2019-11-15 ENCOUNTER — Encounter (HOSPITAL_COMMUNITY): Payer: Self-pay | Admitting: Emergency Medicine

## 2019-11-15 ENCOUNTER — Other Ambulatory Visit: Payer: Self-pay

## 2019-11-15 DIAGNOSIS — Z794 Long term (current) use of insulin: Secondary | ICD-10-CM | POA: Diagnosis not present

## 2019-11-15 DIAGNOSIS — Z575 Occupational exposure to toxic agents in other industries: Secondary | ICD-10-CM | POA: Diagnosis not present

## 2019-11-15 DIAGNOSIS — Z89021 Acquired absence of right finger(s): Secondary | ICD-10-CM | POA: Insufficient documentation

## 2019-11-15 DIAGNOSIS — B1910 Unspecified viral hepatitis B without hepatic coma: Secondary | ICD-10-CM | POA: Insufficient documentation

## 2019-11-15 DIAGNOSIS — R05 Cough: Secondary | ICD-10-CM | POA: Insufficient documentation

## 2019-11-15 DIAGNOSIS — D61818 Other pancytopenia: Secondary | ICD-10-CM | POA: Diagnosis not present

## 2019-11-15 DIAGNOSIS — K7460 Unspecified cirrhosis of liver: Secondary | ICD-10-CM | POA: Diagnosis not present

## 2019-11-15 DIAGNOSIS — Z20822 Contact with and (suspected) exposure to covid-19: Secondary | ICD-10-CM | POA: Insufficient documentation

## 2019-11-15 DIAGNOSIS — I8500 Esophageal varices without bleeding: Secondary | ICD-10-CM | POA: Diagnosis not present

## 2019-11-15 DIAGNOSIS — Z79899 Other long term (current) drug therapy: Secondary | ICD-10-CM | POA: Diagnosis not present

## 2019-11-15 DIAGNOSIS — E119 Type 2 diabetes mellitus without complications: Secondary | ICD-10-CM | POA: Insufficient documentation

## 2019-11-15 DIAGNOSIS — J441 Chronic obstructive pulmonary disease with (acute) exacerbation: Secondary | ICD-10-CM | POA: Diagnosis not present

## 2019-11-15 DIAGNOSIS — Z87891 Personal history of nicotine dependence: Secondary | ICD-10-CM | POA: Diagnosis not present

## 2019-11-15 DIAGNOSIS — Z8611 Personal history of tuberculosis: Secondary | ICD-10-CM | POA: Diagnosis not present

## 2019-11-15 MED ORDER — ALBUTEROL SULFATE HFA 108 (90 BASE) MCG/ACT IN AERS
INHALATION_SPRAY | RESPIRATORY_TRACT | Status: AC
  Filled 2019-11-15: qty 6.7

## 2019-11-15 MED ORDER — DEXAMETHASONE SODIUM PHOSPHATE 10 MG/ML IJ SOLN
10.0000 mg | Freq: Once | INTRAMUSCULAR | Status: AC
  Administered 2019-11-15: 10 mg via INTRAMUSCULAR

## 2019-11-15 MED ORDER — ALBUTEROL SULFATE HFA 108 (90 BASE) MCG/ACT IN AERS
2.0000 | INHALATION_SPRAY | Freq: Once | RESPIRATORY_TRACT | Status: AC
  Administered 2019-11-15: 2 via RESPIRATORY_TRACT

## 2019-11-15 MED ORDER — PROMETHAZINE-DM 6.25-15 MG/5ML PO SYRP: 5.0000 mL | ORAL_SOLUTION | Freq: Two times a day (BID) | ORAL | 1 refills | Status: DC

## 2019-11-15 MED ORDER — DEXAMETHASONE SODIUM PHOSPHATE 10 MG/ML IJ SOLN
INTRAMUSCULAR | Status: AC
  Filled 2019-11-15: qty 1

## 2019-11-15 MED ORDER — BENZONATATE 100 MG PO CAPS: 100.0000 mg | ORAL_CAPSULE | Freq: Three times a day (TID) | ORAL | 1 refills | Status: DC | PRN

## 2019-11-15 NOTE — ED Triage Notes (Signed)
Unable to locate interpreter through services.  Patient has chosen to call pastor

## 2019-11-15 NOTE — ED Notes (Signed)
covid sample in lab, lid tight, verified name and liquid remains in tube

## 2019-11-15 NOTE — Discharge Instructions (Signed)
Your COVID 19 results will be available in 24 hours. Negative results are immediately resulted to Mychart. Positive results will receive a follow-up call from our clinic. If symptoms are present, I recommend home quarantine until results are known.

## 2019-11-15 NOTE — ED Triage Notes (Signed)
[place] interpreter service attempting to locate language

## 2019-11-15 NOTE — ED Provider Notes (Signed)
[place]    CSN: [id number] Arrival date & time: 11/15/19  1419      History   Chief Complaint Chief Complaint  Patient presents with  . Cough    HPI Jose Snow is a 49 y.o. male.   Patient has a close family friend interpreting for him as language line doesn't have patient's language available for encounter today.  HPI  Patient with a complicated medical history including chronic cough, cirrhosis,TB, and  hep B presents for evaluation of a chronic cough. Cough has been present >6 months. At night cough is worsened. In review of EMR, patient was to be referred to pulmonology, however, I do not see that referral was placed. Prior documentation also lists patient has been diagnosed with COPD however, is not currently prescribed any treatment. He denies a history of smoking, however, endorses prior exposure to toxic chemicals at a prior job. He is without fever. He previously had COVID-19 virus in April however, agrees to be retested today.Afebrile today. Denies chest pain, pressure, tightness, or productive cough.e  Past Medical History:  Diagnosis Date  . Cirrhosis ([place])   . Diabetes mellitus ([place])   . Hepatitis B   . Liver cirrhosis secondary to NASH ([place])   . Lymphocytic colitis   . Pancytopenia ([place])   . 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     Patient Active Problem List   Diagnosis Date Noted  . Pancytopenia ([place]) 12/03/2017  . Abnormal echocardiogram 12/03/2017  . New onset type 2 diabetes mellitus ([place]) 12/03/2017  . History of hepatitis B 12/03/2017  . Esophageal varices without bleeding ([place])   . Heart murmur 08/01/2017  . Hepatitis B 02/17/2015  . Cirrhosis of liver due to hepatitis B ([place]) 11/25/2014  . Thrombocytopenia ([place]) 11/25/2014  . History of TB (tuberculosis) 07/16/2010    Past Surgical History:  Procedure Laterality Date  . BIOPSY  10/23/2017   Procedure: BIOPSY;  Surgeon: Milus Banister, MD;  [facility];  Service: Endoscopy;;  . BIOPSY  03/03/2018   Procedure: BIOPSY;  Surgeon: Irving Copas., MD;  Location: [place];  Service: Gastroenterology;;  . COLONOSCOPY WITH PROPOFOL N/A 10/23/2017   Procedure: COLONOSCOPY WITH PROPOFOL;  Surgeon: Milus Banister, MD;  [facility];  Service: Endoscopy;  Laterality: N/A;  . COMPLEX WOUND CLOSURE Right 12/15/2015   Procedure: COMPLEX WOUND CLOSURE;  Surgeon: Leanora Cover, MD;  Location: [place];  Service: Orthopedics;  Laterality: Right;  . ESOPHAGOGASTRODUODENOSCOPY (EGD) WITH PROPOFOL N/A 10/23/2017   Procedure: ESOPHAGOGASTRODUODENOSCOPY (EGD) WITH PROPOFOL;  Surgeon: Milus Banister, MD;  [facility];  Service: Endoscopy;  Laterality: N/A;  . ESOPHAGOGASTRODUODENOSCOPY (EGD) WITH PROPOFOL N/A 03/03/2018   Procedure: ESOPHAGOGASTRODUODENOSCOPY (EGD) WITH PROPOFOL;  Surgeon: Rush Landmark Telford Nab., MD;  Location: [place];  Service: Gastroenterology;  Laterality: N/A;  . I & D EXTREMITY Right 12/15/2015   Procedure: IRRIGATION AND DEBRIDEMENT AND REVISION AMPUTATION RIGHT RING FINGER;  Surgeon: Leanora Cover, MD;  Location: [place];  Service: Orthopedics;  Laterality: Right;       Home Medications    Prior to Admission medications   Medication Sig Start Date End Date Taking? Authorizing Provider  metFORMIN (GLUCOPHAGE) 500 MG tablet Take 500 mg by mouth daily. 05/07/19  Yes [provider]  nadolol (CORGARD) 20 MG tablet TAKE 1 TABLET(20 MG) BY MOUTH DAILY 11/05/19  Yes Milus Banister, MD  omeprazole (PRILOSEC) 40 MG capsule Take 1  capsule (40 mg total) by mouth daily before breakfast. 08/19/19  Yes Sagardia, Ines Bloomer, MD  spironolactone (ALDACTONE) 100 MG tablet TAKE 1 TABLET BY MOUTH DAILY Patient taking differently: Take 100 mg by mouth daily.  11/06/18  Yes Milus Banister, MD  tenofovir (VIREAD) 300 MG tablet Take 1 tablet (300 mg total) by mouth daily. 10/29/19  Yes [place], FNP    B-D UF III MINI PEN NEEDLES 31G X 5 MM MISC daily. use as directed 06/21/19   [provider]  insulin glargine (LANTUS) 100 UNIT/ML injection Inject 6 Units into the skin daily.     [provider]  lactulose (CHRONULAC) 10 GM/15ML solution Take 45 mLs (30 g total) by mouth daily. Patient not taking: Reported on 10/29/2019 05/14/18   Horald Pollen, MD  levocetirizine (XYZAL) 5 MG tablet TAKE 1 TABLET BY MOUTH EVERY EVENING 02/09/18   Rutherford Guys, MD  linagliptin (TRADJENTA) 5 MG TABS tablet Take 1 tablet (5 mg total) by mouth daily. 05/14/18 08/12/18  Horald Pollen, MD  ondansetron (ZOFRAN ODT) 4 MG disintegrating tablet Take 1 tablet (4 mg total) by mouth every 8 (eight) hours as needed for nausea or vomiting. 07/20/19   Wieters, Hallie C, PA-C  sucralfate (CARAFATE) 1 GM/10ML suspension Take 10 mLs (1 g total) by mouth 4 (four) times daily. Take between meals and at bedtime. Patient not taking: Reported on 10/29/2019 05/14/18   Horald Pollen, MD    Family History Family History  Problem Relation Age of Onset  . Colon cancer Neg Hx   . Esophageal cancer Neg Hx   . Pancreatic cancer Neg Hx   . Stomach cancer Neg Hx   . Liver disease Neg Hx     Social History Social History   Tobacco Use  . Smoking status: Former Smoker    Packs/day: 0.50    Years: 8.00    Pack years: 4.00    Types: Cigarettes    Quit date: 04/08/1990    Years since quitting: 29.6  . Smokeless tobacco: Never Used  Vaping Use  . Vaping Use: Never used  Substance Use Topics  . Alcohol use: No  . Drug use: No     Allergies   Other   Review of Systems Review of Systems Pertinent negatives listed in HPI  Physical Exam Triage Vital Signs ED Triage Vitals [11/15/19 1639]  Enc Vitals Group     BP (!) 113/35     Pulse Rate 65     Resp 18     Temp 98.9 F (37.2 C)     Temp Source Oral     SpO2 100 %     Weight      Height      Head Circumference      Peak Flow       Pain Score      Pain Loc      Pain Edu?      Excl. in [place]?    No data found.  Updated Vital Signs BP (!) 113/35 (BP Location: Left Arm)   Pulse 65   Temp 98.9 F (37.2 C) (Oral)   Resp 18   SpO2 100%   Visual Acuity Right Eye Distance:   Left Eye Distance:   Bilateral Distance:    Right Eye Near:   Left Eye Near:    Bilateral Near:     Physical Exam General appearance: alert, well developed, well nourished, cooperative and in  no distress Head: Normocephalic, without obvious abnormality, atraumatic Respiratory: Coarse lung sounds, expiratory wheeze present, no crackles or rales Heart: rate and rhythm normal. No gallop or murmurs noted on exam  Abdomen: BS +, no distention, no rebound tenderness, or no mass Extremities: No gross deformities Skin: Skin color, texture, turgor normal. No rashes seen  Psych: Appropriate mood and affect.    UC Treatments / Results  Labs (all labs ordered are listed, but only abnormal results are displayed) Labs Reviewed - No data to display  EKG   Radiology No results found.  Procedures Procedures (including critical care time)  Medications Ordered in UC Medications  dexamethasone (DECADRON) injection 10 mg (10 mg Intramuscular Given 11/15/19 1741)  albuterol (VENTOLIN HFA) 108 (90 Base) MCG/ACT inhaler 2 puff (2 puffs Inhalation Given 11/15/19 1741)    Initial Impression / Assessment and Plan / UC Course  I have reviewed the triage vital signs and the nursing notes.  Pertinent labs & imaging results that were available during my care of the patient were reviewed by me and considered in my medical decision making (see chart for details).    Treating today for chronic bronchitis. Given the duration of symptoms, cough is unlikely bacterial. Will ordered dexamethasone 10 mg IM and albuterol inhaler (with instructions of how to use). Given diabetes will opt against prescribing a course of oral prednisone. Will prescribed benzonatate  PRN and promethazine DM PRN for cough. Instructions given to use albuterol inhaler for cyclic coughing, wheezing, and or SOB. Referral placed to pulmonology An After Visit Summary was printed and given to the patient/family. Precautions discussed. Red flags discussed. Questions invited and answered. They voiced understanding and agreement.   Final Clinical Impressions(s) / UC Diagnoses   Final diagnoses:  Chronic obstructive pulmonary disease with acute exacerbation [facility])     Discharge Instructions     Your COVID 19 results will be available in 24 hours. Negative results are immediately resulted to Mychart. Positive results will receive a follow-up call from our clinic. If symptoms are present, I recommend home quarantine until results are known.     ED Prescriptions    Medication Sig Dispense Auth. Provider   benzonatate (TESSALON) 100 MG capsule Take 1-2 capsules (100-200 mg total) by mouth 3 (three) times daily as needed for cough. 60 capsule Scot Jun, FNP   promethazine-dextromethorphan (PROMETHAZINE-DM) 6.25-15 MG/5ML syrup Take 5 mLs by mouth 3 (three) times daily - between meals and at bedtime. 140 mL Scot Jun, FNP     PDMP not reviewed this encounter.   Scot Jun, FNP 11/19/19 1002

## 2019-11-16 LAB — SARS CORONAVIRUS 2 (TAT 6-24 HRS): SARS Coronavirus 2: NEGATIVE

## 2019-11-22 ENCOUNTER — Other Ambulatory Visit: Payer: Self-pay | Admitting: Gastroenterology

## 2019-12-01 ENCOUNTER — Other Ambulatory Visit (INDEPENDENT_AMBULATORY_CARE_PROVIDER_SITE_OTHER): Payer: 59

## 2019-12-01 ENCOUNTER — Ambulatory Visit (INDEPENDENT_AMBULATORY_CARE_PROVIDER_SITE_OTHER): Payer: 59 | Admitting: Gastroenterology

## 2019-12-01 ENCOUNTER — Encounter: Payer: Self-pay | Admitting: Gastroenterology

## 2019-12-01 ENCOUNTER — Telehealth: Payer: Self-pay

## 2019-12-01 DIAGNOSIS — K7460 Unspecified cirrhosis of liver: Secondary | ICD-10-CM

## 2019-12-01 LAB — COMPREHENSIVE METABOLIC PANEL
ALT: 29 U/L (ref 0–53)
AST: 31 U/L (ref 0–37)
Albumin: 4 g/dL (ref 3.5–5.2)
Alkaline Phosphatase: 63 U/L (ref 39–117)
BUN: 12 mg/dL (ref 6–23)
CO2: 28 mEq/L (ref 19–32)
Calcium: 8.8 mg/dL (ref 8.4–10.5)
Chloride: 102 mEq/L (ref 96–112)
Creatinine, Ser: 1.33 mg/dL (ref 0.40–1.50)
GFR: 56.54 mL/min — ABNORMAL LOW (ref >60.00)
Glucose, Bld: 175 mg/dL — ABNORMAL HIGH (ref 70–99)
Potassium: 4.3 mEq/L (ref 3.5–5.1)
Sodium: 134 mEq/L — ABNORMAL LOW (ref 135–145)
Total Bilirubin: 1.6 mg/dL — ABNORMAL HIGH (ref 0.2–1.2)
Total Protein: 7.3 g/dL (ref 6.0–8.3)

## 2019-12-01 LAB — CBC
HCT: 35.3 % — ABNORMAL LOW (ref 39.0–52.0)
Hemoglobin: 12.4 g/dL — ABNORMAL LOW (ref 13.0–17.0)
MCHC: 35.2 g/dL (ref 30.0–36.0)
MCV: 95.6 fl (ref 78.0–100.0)
Platelets: 27 10*3/uL — CL (ref 150.0–400.0)
RBC: 3.69 Mil/uL — ABNORMAL LOW (ref 4.22–5.81)
RDW: 14.7 % (ref 11.5–15.5)
WBC: 2.9 10*3/uL — ABNORMAL LOW (ref 4.0–10.5)

## 2019-12-01 LAB — PROTIME-INR
INR: 1.3 ratio — ABNORMAL HIGH (ref 0.8–1.0)
Prothrombin Time: 14.5 s — ABNORMAL HIGH (ref 9.6–13.1)

## 2019-12-01 MED ORDER — NADOLOL 20 MG PO TABS: ORAL_TABLET | ORAL | 11 refills | Status: DC

## 2019-12-01 NOTE — Patient Instructions (Signed)
If you are age 51 or older, your body mass index should be between 23-30. Your Body mass index is 19.57 kg/m. If this is out of the aforementioned range listed, please consider follow up with your Primary Care Provider.  If you are age 24 or younger, your body mass index should be between 19-25. Your Body mass index is 19.57 kg/m. If this is out of the aformentioned range listed, please consider follow up with your Primary Care Provider.   Your provider has requested that you go to the basement level for lab work before leaving today. Press "B" on the elevator. The lab is located at the first door on the left as you exit the elevator.  You have been scheduled for an abdominal ultrasound at [facility] (1st floor of hospital) on 12-08-19 at 8:30am. Please arrive 15 minutes prior to your appointment for registration. Make certain not to have anything to eat or drink after midnight the night prior to your appointment. Should you need to reschedule your appointment, please contact radiology at [phone]. This test typically takes about 30 minutes to perform.  You will follow up in the office in 6 months.(Feb 2022) We will contact you to schedule this appointment.  We will contact [facility] to schedule an appointment.  If you do not hear from them in the next few weeks, please contact our office to let us know.  Thank you for entrusting me with your care and choosing [facility].  Dr Ardis Hughs

## 2019-12-01 NOTE — Telephone Encounter (Signed)
Santiago Glad at the lab called with a critical platelet value of 27,000 for pt.

## 2019-12-01 NOTE — Progress Notes (Signed)
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 Lurlean Leyden and eventual care by Dr. Elna Breslow ID  Thrombocytopenia (Plts around 20K), mild ascites, signs of portal HTN on imaging  Labs 2019:Hepatitis B E antibody reactive,hepatitis B core total antibody reactive,hepatitis B surface antigen reactive,hepatitis B E antigen nonreactive (2 years ago this was positive)hepatitis B surface antibody Nonreactive,hepatitis B DNA 1,200,000 IUs/mL, HIV negative  Cirrhosis etiology workup:Labs 2016:Ferritin slightly elevated,HIV negative, hepatitis C virus IgG negative, anti-smooth muscle antibody IgG negative, ANA negative, HCV Ab negative, ceruloplamin 12.3 (slightly low), A1A normal.  Current MELD: 13, based on several scattered data points throughout 2021  EGD 10/2017,[name]; small to medium sized esophageal varices, + portal gastropathy. Started nadolol 100m daily.EGD November 2019 while inpatient for drop in hemoglobin and abdominal pain found the same as above as well as multiple erosions. Biopsies were taken and he was H. pylori negative. He was put on antibiotics for H. pylori given serologic positive testing (pylera)  AFP 01/2019 11  Liver imaging 5/2019CT without IV contrast: cirrhosis, splenomegaly, mild ascites, signs of portal HTN. 02/2018 CT scan wo IV contrast cirrhosis with no masses.  MRI January 2020 motion degraded, cirrhosis without obvious hepatoma. Ultrasound March 2021 cirrhosis without focal lesions.  [facility]2017 Dr. ZLurlean Leyden 2. Lymphocytic colitis:colonoscopy 10/2017 Dr. JArdis Hughsfor chronic diarrhea: hemorrhoids, o/w normal. Random biopsies + lymphocytic colitis. Started on budesonide 964mdaily.   HPI: This is a very pleasant [age]ear old [place]   I last saw him almost a year ago. I explained to him that it was important that he  get back [facility]established with [facility] work-up.  He was to return to see me in 4 months. I have not seen him since.  He saw infectious disease Dr. CaElna Breslowast month. The plan was to continue his tenofovir indefinitely. Blood work last month showed undetectable hepatitis B viral DNA  His weight is down 8 pounds since his last visit here about a year ago same scale  He is here with a professional interpreter.  Even with that, much is lost in translation  He had a lot of questions about his lungs.  He went to an urgent clinic earlier this month because of some coughing.  I believe he has an appointment with a pulmonologist next week.  I do not think [facility] that was about 2 [facility].  The pandemic intervened and I do not think he has been back.  He has had no troubles with edema, overt GI bleeding, no encephalopathy.  He does not take lactulose even though it is on his medicine list below he has not been on it in months.  He needs refills of nadolol   ROS: complete GI ROS as described in HPI, all other review negative.  Constitutional:  No unintentional weight loss   Past Medical History:  Diagnosis Date  . Cirrhosis ([facility]  . Diabetes mellitus ([place]  . Hepatitis B   . Liver cirrhosis secondary to NASH (H[place]  . Lymphocytic colitis   . Pancytopenia ([facility]  . 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     Past Surgical History:  Procedure Laterality Date  . BIOPSY  10/23/2017   Procedure: BIOPSY;  Surgeon: JaMilus BanisterMD;  Location[facility];  Service: Endoscopy;;  .  BIOPSY  03/03/2018   Procedure: BIOPSY;  Surgeon: Rush Landmark Telford Nab., MD;  Location: [place];  Service: Gastroenterology;;  . COLONOSCOPY WITH PROPOFOL N/A 10/23/2017   Procedure: COLONOSCOPY WITH PROPOFOL;  Surgeon: Milus Banister, MD;  Location: [facility];  Service:  Endoscopy;  Laterality: N/A;  . COMPLEX WOUND CLOSURE Right 12/15/2015   Procedure: COMPLEX WOUND CLOSURE;  Surgeon: Leanora Cover, MD;  Location: [place];  Service: Orthopedics;  Laterality: Right;  . ESOPHAGOGASTRODUODENOSCOPY (EGD) WITH PROPOFOL N/A 10/23/2017   Procedure: ESOPHAGOGASTRODUODENOSCOPY (EGD) WITH PROPOFOL;  Surgeon: Milus Banister, MD;  [facility];  Service: Endoscopy;  Laterality: N/A;  . ESOPHAGOGASTRODUODENOSCOPY (EGD) WITH PROPOFOL N/A 03/03/2018   Procedure: ESOPHAGOGASTRODUODENOSCOPY (EGD) WITH PROPOFOL;  Surgeon: Rush Landmark Telford Nab., MD;  Location: [place];  Service: Gastroenterology;  Laterality: N/A;  . I & D EXTREMITY Right 12/15/2015   Procedure: IRRIGATION AND DEBRIDEMENT AND REVISION AMPUTATION RIGHT RING FINGER;  Surgeon: Leanora Cover, MD;  Location: [place];  Service: Orthopedics;  Laterality: Right;    Current Outpatient Medications  Medication Sig Dispense Refill  . B-D UF III MINI PEN NEEDLES 31G X 5 MM MISC daily. use as directed    . benzonatate (TESSALON) 100 MG capsule Take 1-2 capsules (100-200 mg total) by mouth 3 (three) times daily as needed for cough. 60 capsule 1  . insulin glargine (LANTUS) 100 UNIT/ML injection Inject 6 Units into the skin daily.     Marland Kitchen lactulose (CHRONULAC) 10 GM/15ML solution Take 45 mLs (30 g total) by mouth daily. 240 mL 3  . metFORMIN (GLUCOPHAGE) 500 MG tablet Take 500 mg by mouth daily.    . nadolol (CORGARD) 20 MG tablet TAKE 1 TABLET(20 MG) BY MOUTH DAILY 30 tablet 0  . omeprazole (PRILOSEC) 40 MG capsule Take 1 capsule (40 mg total) by mouth daily before breakfast. 30 capsule 6  . ondansetron (ZOFRAN ODT) 4 MG disintegrating tablet Take 1 tablet (4 mg total) by mouth every 8 (eight) hours as needed for nausea or vomiting. 20 tablet 0  . promethazine-dextromethorphan (PROMETHAZINE-DM) 6.25-15 MG/5ML syrup Take 5 mLs by mouth 3 (three) times daily - between meals and at bedtime. 140 mL 1  . spironolactone  (ALDACTONE) 100 MG tablet Take 1 tablet (100 mg total) by mouth daily. 90 tablet 3  . tenofovir (VIREAD) 300 MG tablet Take 1 tablet (300 mg total) by mouth daily. 30 tablet 5   No current facility-administered medications for this visit.    Allergies as of 12/01/2019 - Review Complete 12/01/2019  Allergen Reaction Noted  . Other  11/15/2019    Family History  Problem Relation Age of Onset  . Colon cancer Neg Hx   . Esophageal cancer Neg Hx   . Pancreatic cancer Neg Hx   . Stomach cancer Neg Hx   . Liver disease Neg Hx     Social History   Socioeconomic History  . Marital status: Married    Spouse name: Not on file  . Number of children: 4  . Years of education: Not on file  . Highest education level: Not on file  Occupational History  . Occupation: [employer]    Comment: Builds chair parts  Tobacco Use  . Smoking status: Former Smoker    Packs/day: 0.50    Years: 8.00    Pack years: 4.00    Types: Cigarettes    Quit date: 04/08/1990    Years since quitting: 29.6  . Smokeless tobacco: Never Used  Vaping  Use  . Vaping Use: Never used  Substance and Sexual Activity  . Alcohol use: No  . Drug use: No  . Sexual activity: Yes  Other Topics Concern  . 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  Lives with wife and four children.    Social Determinants of Health   Financial Resource Strain:   . Difficulty of Paying Living Expenses: Not on file  Food Insecurity:   . Worried About Charity fundraiser in the Last Year: Not on file  . Ran Out of Food in the Last Year: Not on file  Transportation Needs:   . Lack of Transportation (Medical): Not on file  . Lack of Transportation (Non-Medical): Not on file  Physical Activity:   . Days of Exercise per Week: Not on file  . Minutes of Exercise per Session: Not on file  Stress:   . Feeling of Stress : Not on file  Social Connections:   . Frequency of Communication with Friends and Family: Not on file   . Frequency of Social Gatherings with Friends and Family: Not on file  . Attends Religious Services: Not on file  . Active Member of Clubs or Organizations: Not on file  . Attends Archivist Meetings: Not on file  . Marital Status: Not on file  Intimate Partner Violence:   . Fear of Current or Ex-Partner: Not on file  . Emotionally Abused: Not on file  . Physically Abused: Not on file  . Sexually Abused: Not on file     Physical Exam: Ht 5' 4"  (1.626 m)   Wt 114 lb (51.7 kg)   BMI 19.57 kg/m   BP (!) 88/58   Pulse 69   Ht 5' 4"  (1.626 m)   Wt 114 lb (51.7 kg)   BMI 19.57 kg/m   Constitutional: generally well-appearing Psychiatric: alert and oriented x3 Abdomen: soft, nontender, nondistended, no obvious ascites, no peritoneal signs, normal bowel sounds No peripheral edema noted in lower extremities  Assessment and plan: 49 y.o. male with chronic hepatitis B, cirrhosis  He needs liver disease restaging, will have labs checked for CBC, complete metabolic profile, coags and alpha-fetoprotein.  We will calculate up-to-date meld score.  He needs hepatoma screening with ultrasound, it has been 6 months.  I will refill his nadolol today at current dose 20 mg once daily.  I do not think there is room to increase this given his blood pressure.  We will remove the lactulose from his medicine list and he has not been taking it in many months.  He will return to see me in 6 months and sooner if any problems.  We will also reach out to [facility] liver transplant team.  I do think that he was evaluated there in 2019 however he never followed up with them, I think the pandemic intervened.  We will see if we can help him reestablish that relationship.  Please see the "Patient Instructions" section for addition details about the plan.  Owens Loffler, MD [facility] 12/01/2019, 11:14 AM   Total time on date of encounter was 25 minutes (this included  time spent preparing to see the patient reviewing records; obtaining and/or reviewing separately obtained history; performing a medically appropriate exam and/or evaluation; counseling and educating the patient and family if present; ordering medications, tests or procedures if applicable; and documenting clinical information in the health record).

## 2019-12-02 ENCOUNTER — Telehealth: Payer: Self-pay

## 2019-12-02 LAB — AFP TUMOR MARKER: AFP-Tumor Marker: 4.9 ng/mL (ref <6.1)

## 2019-12-02 NOTE — Telephone Encounter (Signed)
Left message at [facility] for someone to call back to schedule patient an appointment to further discuss liver transplant.

## 2019-12-03 NOTE — Telephone Encounter (Signed)
Spoke with interpreter Tawny Hopping to notify him of appointment on 01-17-20 at 10:00am arrive at 9:45am.

## 2019-12-05 ENCOUNTER — Other Ambulatory Visit: Payer: Self-pay | Admitting: Gastroenterology

## 2019-12-08 ENCOUNTER — Other Ambulatory Visit: Payer: Self-pay

## 2019-12-08 ENCOUNTER — Ambulatory Visit (INDEPENDENT_AMBULATORY_CARE_PROVIDER_SITE_OTHER): Payer: 59 | Admitting: Pulmonary Disease

## 2019-12-08 ENCOUNTER — Ambulatory Visit (INDEPENDENT_AMBULATORY_CARE_PROVIDER_SITE_OTHER): Payer: 59

## 2019-12-08 ENCOUNTER — Ambulatory Visit (HOSPITAL_COMMUNITY)
Admission: RE | Admit: 2019-12-08 | Discharge: 2019-12-08 | Disposition: A | Discharge status: Home / Self Care | Payer: 59 | Source: Ambulatory Visit | Attending: Gastroenterology | Admitting: Gastroenterology

## 2019-12-08 ENCOUNTER — Encounter: Payer: Self-pay | Admitting: Pulmonary Disease

## 2019-12-08 VITALS — BP 110/64 | HR 63 | Temp 98.3°F | Ht 62.5 in | Wt 114.4 lb

## 2019-12-08 DIAGNOSIS — R05 Cough: Secondary | ICD-10-CM | POA: Diagnosis not present

## 2019-12-08 DIAGNOSIS — R053 Chronic cough: Secondary | ICD-10-CM | POA: Insufficient documentation

## 2019-12-08 DIAGNOSIS — R0602 Shortness of breath: Secondary | ICD-10-CM

## 2019-12-08 DIAGNOSIS — K7460 Unspecified cirrhosis of liver: Secondary | ICD-10-CM | POA: Insufficient documentation

## 2019-12-08 MED ORDER — INCRUSE ELLIPTA 62.5 MCG/INH IN AEPB: 1.0000 | INHALATION_SPRAY | Freq: Every day | RESPIRATORY_TRACT | 2 refills | Status: DC

## 2019-12-08 NOTE — Progress Notes (Signed)
Subjective:   PATIENT ID: Jose Snow GENDER: male DOB: [birth year], MRN: [id number]   HPI  Chief Complaint  Patient presents with  . Consult    shortness of breath (works with chemicals at job)    Reason for Visit: New consult for shortness of breath  [name] is a 49 year old former smoker with cirrhosis secondary to hepatitis B, hx TB who presents to establish care.  He had covid in April 2021 and was treated with bamlanivimab (monoclonal antibody infusion). On 11/15/19, he was seen in urgent care for acute bronchitis and treated with dexametasone, albuterol and PRN tessalon perles. He was referred to Pulmonary.   He has had difficulty breathing for many years. Associated with productive cough with brown sputum and wheezing. Worsened at night. Worsened when exposed to cleaning supplies . He has worked with the [employer] for >20 years. He is still with the company but now works in a different department. He previously smoked for at least 10 years and smoked every day. Stopped smoking in 1993. He uses albuterol as needed but he cannot recall how often. In the last two months, he has had worsening symptoms and was seen in urgent care as noted above.  I have personally reviewed patient's past medical/family/social history, allergies, current medications.  Past Medical History:  Diagnosis Date  . Cirrhosis ([place])   . Diabetes mellitus ([place])   . Hepatitis B   . Liver cirrhosis secondary to NASH ([place])   . Lymphocytic colitis   . Pancytopenia ([place])   . 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      Family History  Problem Relation Age of Onset  . Colon cancer Neg Hx   . Esophageal cancer Neg Hx   . Pancreatic cancer Neg Hx   . Stomach cancer Neg Hx   . Liver disease Neg Hx      Social History   Occupational History  . Occupation: [place]    Comment: Builds chair parts  Tobacco Use  . Smoking status: Former Smoker    Packs/day: 0.50     Years: 8.00    Pack years: 4.00    Types: Cigarettes    Quit date: 04/08/1990    Years since quitting: 29.6  . Smokeless tobacco: Never Used  Vaping Use  . Vaping Use: Never used  Substance and Sexual Activity  . Alcohol use: No  . Drug use: No  . Sexual activity: Yes    Allergies  Allergen Reactions  . Other     Yellow cough medicine makes him dizzy     Outpatient Medications Prior to Visit  Medication Sig Dispense Refill  . B-D UF III MINI PEN NEEDLES 31G X 5 MM MISC daily. use as directed    . benzonatate (TESSALON) 100 MG capsule Take 1-2 capsules (100-200 mg total) by mouth 3 (three) times daily as needed for cough. 60 capsule 1  . insulin glargine (LANTUS) 100 UNIT/ML injection Inject 6 Units into the skin daily.     Marland Kitchen lactulose (CHRONULAC) 10 GM/15ML solution Take 45 mLs (30 g total) by mouth daily. 240 mL 3  . metFORMIN (GLUCOPHAGE) 500 MG tablet Take 500 mg by mouth daily.    . nadolol (CORGARD) 20 MG tablet TAKE 1 TABLET(20 MG) BY MOUTH DAILY 30 tablet 11  . omeprazole (PRILOSEC) 40 MG capsule Take 1 capsule (40 mg total) by mouth daily before breakfast. 30 capsule 6  .  ondansetron (ZOFRAN ODT) 4 MG disintegrating tablet Take 1 tablet (4 mg total) by mouth every 8 (eight) hours as needed for nausea or vomiting. 20 tablet 0  . promethazine-dextromethorphan (PROMETHAZINE-DM) 6.25-15 MG/5ML syrup Take 5 mLs by mouth 3 (three) times daily - between meals and at bedtime. 140 mL 1  . spironolactone (ALDACTONE) 100 MG tablet Take 1 tablet (100 mg total) by mouth daily. 90 tablet 3  . tenofovir (VIREAD) 300 MG tablet Take 1 tablet (300 mg total) by mouth daily. 30 tablet 5   No facility-administered medications prior to visit.    Review of Systems  Constitutional: Negative for chills, diaphoresis, fever, malaise/fatigue and weight loss.  HENT: Negative for congestion, ear pain and sore throat.   Respiratory: Positive for cough, sputum production and wheezing. Negative for  hemoptysis and shortness of breath.   Cardiovascular: Negative for chest pain, palpitations and leg swelling.  Gastrointestinal: Negative for abdominal pain, heartburn and nausea.  Genitourinary: Negative for frequency.  Musculoskeletal: Negative for joint pain and myalgias.  Skin: Negative for itching and rash.  Neurological: Negative for dizziness, weakness and headaches.  Endo/Heme/Allergies: Does not bruise/bleed easily.  Psychiatric/Behavioral: Negative for depression. The patient is not nervous/anxious.      Objective:   Vitals:   12/08/19 1618  BP: 110/64  Pulse: 63  Temp: 98.3 F (36.8 C)  TempSrc: Other (Comment)  SpO2: 97%  Weight: 114 lb 6.4 oz (51.9 kg)  Height: 5' 2.5" (1.588 m)      Physical Exam: General: Well-appearing, no acute distress HENT: [place], AT Eyes: EOMI, no scleral icterus Respiratory:Decreased LLL lung sounds Cardiovascular: RRR, -M/R/G, no JVD GI: BS+, soft, nontender Extremities:-Edema,-tenderness Neuro: AAO x4, CNII-XII grossly intact Skin: Intact, no rashes or bruising Psych: Normal mood, normal affect  Data Reviewed:  Imaging: CXR 12/08/19 - Emphysema, bilateral upper lobe scarring  PFT: None  Labs: CBC    Component Value Date/Time   WBC 2.9 (L) 12/01/2019 1153   RBC 3.69 (L) 12/01/2019 1153   HGB 12.4 (L) 12/01/2019 1153   HGB 10.8 (L) 12/03/2017 1632   HCT 35.3 (L) 12/01/2019 1153   HCT 33.0 (L) 12/03/2017 1632   PLT 27.0 Repeated and verified X2. (LL) 12/01/2019 1153   PLT 24 (LL) 12/03/2017 1632   MCV 95.6 12/01/2019 1153   MCV 97.6 08/20/2018 1644   MCV 103 (H) 12/03/2017 1632   MCH 33.3 06/08/2019 0815   MCHC 35.2 12/01/2019 1153   RDW 14.7 12/01/2019 1153   RDW 15.9 (H) 12/03/2017 1632   LYMPHSABS 0.6 (L) 06/08/2019 0815   LYMPHSABS 0.9 12/03/2017 1632   MONOABS 0.1 06/08/2019 0815   EOSABS 0.1 06/08/2019 0815   EOSABS 0.1 12/03/2017 1632   BASOSABS 0.0 06/08/2019 0815   BASOSABS 0.0 12/03/2017 1632   BMET     Component Value Date/Time   NA 134 (L) 12/01/2019 1153   NA 139 08/20/2018 1648   K 4.3 12/01/2019 1153   CL 102 12/01/2019 1153   CO2 28 12/01/2019 1153   GLUCOSE 175 (H) 12/01/2019 1153   BUN 12 12/01/2019 1153   BUN 17 08/20/2018 1648   CREATININE 1.33 12/01/2019 1153   CREATININE 0.92 08/05/2017 1217   CALCIUM 8.8 12/01/2019 1153   GFRNONAA >60 06/08/2019 0815   GFRNONAA >60 08/05/2017 1217   GFRAA >60 06/08/2019 0815   GFRAA >60 08/05/2017 1217      Assessment & Plan:   Discussion: 49 year old male former smoker who presents with chronic  bronchitis and presumed COPD. His symptoms are uncontrolled on SABA. Has not tried long-acting bronchodilators. He was counseled and educated on inhaler use by me. Physical exam with diminished left lower lobe breath sounds. CXR in-office obtained and demonstrated no acute changes in setting of emphysema and known upper lobe scarring.  Chronic Bronchitis --START Incruse ONE puff ONCE a day --CONTINUE Albuterol as needed --Will arrange pulmonary function test prior to next visit  Follow-up with me  Health Maintenance Immunization History  Administered Date(s) Administered  . Influenza,inj,Quad PF,6+ Mos 01/28/2016, 12/29/2017, 12/21/2018  . Moderna SARS-COVID-2 Vaccination 11/19/2019  . Pneumococcal Polysaccharide-23 08/20/2018  . Tdap 12/29/2017   CT Lung Screen - not qualified  Orders Placed This Encounter  Procedures  . DG Chest 2 View    Standing Status:   Future    Number of Occurrences:   1    Standing Expiration Date:   12/07/2020    Order Specific Question:   Reason for Exam (SYMPTOM  OR DIAGNOSIS REQUIRED)    Answer:   shortness of breath    Order Specific Question:   Preferred imaging location?    Answer:   Internal    Order Specific Question:   Radiology Contrast Protocol - do NOT remove file path    Answer:   [email or web address]  . Pulmonary function test     Standing Status:   Future    Standing Expiration Date:   12/07/2020    Order Specific Question:   Where should this test be performed?    Answer:   [facility]    Order Specific Question:   Full PFT: includes the following: basic spirometry, spirometry pre & post bronchodilator, diffusion capacity (DLCO), lung volumes    Answer:   Full PFT   Meds ordered this encounter  Medications  . umeclidinium bromide (INCRUSE ELLIPTA) 62.5 MCG/INH AEPB    Sig: Inhale 1 puff into the lungs daily.    Dispense:  30 each    Refill:  2    Return in about 2 months (around 02/07/2020).  I have spent a total time of 40-minutes on the day of the appointment reviewing prior documentation, coordinating care and discussing medical diagnosis and plan with the patient/family. Imaging, labs and tests included in this note have been reviewed and interpreted independently by me.  [place], MD [facility] 12/08/2019 3:25 PM  Office Number [phone]

## 2019-12-12 ENCOUNTER — Other Ambulatory Visit: Payer: Self-pay | Admitting: Family Medicine

## 2019-12-12 NOTE — Telephone Encounter (Signed)
Requested medication (s) are due for refill today: yes  Requested medication (s) are on the active medication list: yes  Last refill:  11/15/19   Future visit scheduled: no  Notes to clinic:  no refill protocol for this medication   Requested Prescriptions  Pending Prescriptions Disp Refills   promethazine-dextromethorphan (PROMETHAZINE-DM) 6.25-15 MG/5ML syrup [Pharmacy Med Name: PROMETHAZINE DM SYRUP] 140 mL 1    Sig: TAKE 5 ML BY MOUTH THREE TIMES DAILY AT BEDTIME BETWEEN MEALS      There is no refill protocol information for this order

## 2019-12-18 ENCOUNTER — Telehealth: Payer: Self-pay | Admitting: *Deleted

## 2019-12-18 ENCOUNTER — Ambulatory Visit (INDEPENDENT_AMBULATORY_CARE_PROVIDER_SITE_OTHER): Payer: 59

## 2019-12-18 ENCOUNTER — Ambulatory Visit (HOSPITAL_COMMUNITY): Payer: 59

## 2019-12-18 ENCOUNTER — Other Ambulatory Visit: Payer: Self-pay

## 2019-12-18 ENCOUNTER — Ambulatory Visit (HOSPITAL_COMMUNITY)
Admission: EM | Admit: 2019-12-18 | Discharge: 2019-12-18 | Disposition: A | Discharge status: Home / Self Care | Payer: 59 | Attending: Emergency Medicine | Admitting: Emergency Medicine

## 2019-12-18 ENCOUNTER — Encounter (HOSPITAL_COMMUNITY): Payer: Self-pay

## 2019-12-18 DIAGNOSIS — R05 Cough: Secondary | ICD-10-CM | POA: Diagnosis not present

## 2019-12-18 DIAGNOSIS — Z87891 Personal history of nicotine dependence: Secondary | ICD-10-CM | POA: Diagnosis not present

## 2019-12-18 DIAGNOSIS — Z79899 Other long term (current) drug therapy: Secondary | ICD-10-CM | POA: Diagnosis not present

## 2019-12-18 DIAGNOSIS — Z20822 Contact with and (suspected) exposure to covid-19: Secondary | ICD-10-CM | POA: Diagnosis not present

## 2019-12-18 DIAGNOSIS — Z794 Long term (current) use of insulin: Secondary | ICD-10-CM | POA: Insufficient documentation

## 2019-12-18 DIAGNOSIS — R058 Other specified cough: Secondary | ICD-10-CM

## 2019-12-18 DIAGNOSIS — I8510 Secondary esophageal varices without bleeding: Secondary | ICD-10-CM | POA: Diagnosis not present

## 2019-12-18 DIAGNOSIS — K7460 Unspecified cirrhosis of liver: Secondary | ICD-10-CM | POA: Diagnosis not present

## 2019-12-18 DIAGNOSIS — D61818 Other pancytopenia: Secondary | ICD-10-CM | POA: Insufficient documentation

## 2019-12-18 MED ORDER — BENZONATATE 100 MG PO CAPS
100.0000 mg | ORAL_CAPSULE | Freq: Three times a day (TID) | ORAL | 0 refills | Status: DC
Start: 2019-12-18 — End: 2020-05-26

## 2019-12-18 NOTE — ED Provider Notes (Signed)
[place]    CSN: [id number] Arrival date & time: 12/18/19  1216      History   Chief Complaint Chief Complaint  Patient presents with  . Cough    HPI Jose Snow is a 49 y.o. male.   Patient presents with cough x1 week; worse since yesterday.  The cough is productive of yellow phlegm.  He denies fever, chills, sore throat, shortness of breath, vomiting, diarrhea, or other symptoms.  No treatments attempted at home.  Patient has remote history of smoking; quit in 1992.  His medical history is significant for tuberculosis, chronic cough, diabetes, hepatitis B, cirrhosis of liver, esophageal varices, pancytopenia, abnormal ECG, lymphocytic colitis.     The history is provided by the patient.    Past Medical History:  Diagnosis Date  . Cirrhosis ([place])   . Diabetes mellitus ([place])   . Hepatitis B   . Liver cirrhosis secondary to NASH ([place])   . Lymphocytic colitis   . Pancytopenia ([place])   . 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     Patient Active Problem List   Diagnosis Date Noted  . Chronic cough 12/08/2019  . Pancytopenia ([place]) 12/03/2017  . Abnormal echocardiogram 12/03/2017  . New onset type 2 diabetes mellitus ([place]) 12/03/2017  . History of hepatitis B 12/03/2017  . Esophageal varices without bleeding ([place])   . Heart murmur 08/01/2017  . Hepatitis B 02/17/2015  . Cirrhosis of liver due to hepatitis B ([place]) 11/25/2014  . Thrombocytopenia ([place]) 11/25/2014  . History of TB (tuberculosis) 07/16/2010    Past Surgical History:  Procedure Laterality Date  . BIOPSY  10/23/2017   Procedure: BIOPSY;  Surgeon: Milus Banister, MD;  Location: [facility];  Service: Endoscopy;;  . BIOPSY  03/03/2018   Procedure: BIOPSY;  Surgeon: Irving Copas., MD;  Location: [facility];  Service: Gastroenterology;;  . COLONOSCOPY WITH PROPOFOL N/A 10/23/2017   Procedure: COLONOSCOPY WITH PROPOFOL;  Surgeon: Milus Banister, MD;  Location: [facility];  Service: Endoscopy;  Laterality: N/A;  . COMPLEX WOUND CLOSURE Right 12/15/2015   Procedure: COMPLEX WOUND CLOSURE;  Surgeon: Leanora Cover, MD;  Location: [facility];  Service: Orthopedics;  Laterality: Right;  . ESOPHAGOGASTRODUODENOSCOPY (EGD) WITH PROPOFOL N/A 10/23/2017   Procedure: ESOPHAGOGASTRODUODENOSCOPY (EGD) WITH PROPOFOL;  Surgeon: Milus Banister, MD;  [facility]: [facility];  Service: Endoscopy;  Laterality: N/A;  . ESOPHAGOGASTRODUODENOSCOPY (EGD) WITH PROPOFOL N/A 03/03/2018   Procedure: ESOPHAGOGASTRODUODENOSCOPY (EGD) WITH PROPOFOL;  Surgeon: Rush Landmark Telford Nab., MD;  Location: [facility];  Service: Gastroenterology;  Laterality: N/A;  . I & D EXTREMITY Right 12/15/2015   Procedure: IRRIGATION AND DEBRIDEMENT AND REVISION AMPUTATION RIGHT RING FINGER;  Surgeon: Leanora Cover, MD;  Location: [place];  Service: Orthopedics;  Laterality: Right;       Home Medications    Prior to Admission medications   Medication Sig Start Date End Date Taking? Authorizing Provider  B-D UF III MINI PEN NEEDLES 31G X 5 MM MISC daily. use as directed 06/21/19  Yes [provider]  insulin glargine (LANTUS) 100 UNIT/ML injection Inject 6 Units into the skin daily.    Yes [provider]  metFORMIN (GLUCOPHAGE) 500 MG tablet Take 500 mg by mouth daily. 05/07/19  Yes [provider]  nadolol (CORGARD) 20 MG tablet TAKE 1 TABLET(20 MG) BY MOUTH DAILY 12/06/19  Yes Milus Banister, MD  omeprazole (PRILOSEC) 40 MG capsule Take 1 capsule (40 mg  total) by mouth daily before breakfast. 08/19/19  Yes Sagardia, Ines Bloomer, MD  tenofovir (VIREAD) 300 MG tablet Take 1 tablet (300 mg total) by mouth daily. 10/29/19  Yes Golden Circle, FNP  umeclidinium bromide (INCRUSE ELLIPTA) 62.5 MCG/INH AEPB Inhale 1 puff into the lungs daily. 12/08/19  Yes Margaretha Seeds, MD  benzonatate (TESSALON) 100 MG capsule Take 1 capsule (100 mg total) by mouth every 8 (eight) hours. 12/18/19    Sharion Balloon, NP  lactulose (CHRONULAC) 10 GM/15ML solution Take 45 mLs (30 g total) by mouth daily. 05/14/18   Horald Pollen, MD  ondansetron (ZOFRAN ODT) 4 MG disintegrating tablet Take 1 tablet (4 mg total) by mouth every 8 (eight) hours as needed for nausea or vomiting. 07/20/19   Wieters, Hallie C, PA-C  promethazine-dextromethorphan (PROMETHAZINE-DM) 6.25-15 MG/5ML syrup Take 5 mLs by mouth 3 (three) times daily - between meals and at bedtime. Patient not taking: Reported on 12/08/2019 11/15/19   Scot Jun, FNP  spironolactone (ALDACTONE) 100 MG tablet Take 1 tablet (100 mg total) by mouth daily. 11/22/19   Milus Banister, MD    Family History Family History  Problem Relation Age of Onset  . Colon cancer Neg Hx   . Esophageal cancer Neg Hx   . Pancreatic cancer Neg Hx   . Stomach cancer Neg Hx   . Liver disease Neg Hx     Social History Social History   Tobacco Use  . Smoking status: Former Smoker    Packs/day: 0.50    Years: 8.00    Pack years: 4.00    Types: Cigarettes    Quit date: 04/08/1990    Years since quitting: 29.7  . Smokeless tobacco: Never Used  Vaping Use  . Vaping Use: Never used  Substance Use Topics  . Alcohol use: No  . Drug use: No     Allergies   Other   Review of Systems Review of Systems  Constitutional: Negative for chills and fever.  HENT: Negative for ear pain and sore throat.   Eyes: Negative for pain and visual disturbance.  Respiratory: Positive for cough. Negative for shortness of breath.   Cardiovascular: Negative for chest pain and palpitations.  Gastrointestinal: Negative for abdominal pain and vomiting.  Genitourinary: Negative for dysuria and hematuria.  Musculoskeletal: Negative for arthralgias and back pain.  Skin: Negative for color change and rash.  Neurological: Negative for seizures and syncope.  All other systems reviewed and are negative.    Physical Exam Triage Vital Signs ED Triage Vitals  Enc  Vitals Group     BP 12/18/19 1343 96/70     Pulse Rate 12/18/19 1343 63     Resp 12/18/19 1343 18     Temp 12/18/19 1343 98.1 F (36.7 C)     Temp Source 12/18/19 1343 Oral     SpO2 12/18/19 1343 100 %     Weight --      Height --      Head Circumference --      Peak Flow --      Pain Score 12/18/19 1347 0     Pain Loc --      Pain Edu? --      Excl. in [place]? --    No data found.  Updated Vital Signs BP 96/70 (BP Location: Right Arm)   Pulse 63   Temp 98.1 F (36.7 C) (Oral)   Resp 18   SpO2 100%   Visual  Acuity Right Eye Distance:   Left Eye Distance:   Bilateral Distance:    Right Eye Near:   Left Eye Near:    Bilateral Near:     Physical Exam Vitals and nursing note reviewed.  Constitutional:      General: He is not in acute distress.    Appearance: He is well-developed.  HENT:     Head: Normocephalic and atraumatic.     Right Ear: Tympanic membrane normal.     Left Ear: Tympanic membrane normal.     Nose: Nose normal.     Mouth/Throat:     Mouth: Mucous membranes are moist.     Pharynx: Oropharynx is clear.  Eyes:     Conjunctiva/sclera: Conjunctivae normal.  Cardiovascular:     Rate and Rhythm: Normal rate and regular rhythm.     Heart sounds: No murmur heard.   Pulmonary:     Effort: Pulmonary effort is normal. No respiratory distress.     Breath sounds: Normal breath sounds.     Comments: Left lung sounds diminished.  Abdominal:     Palpations: Abdomen is soft.     Tenderness: There is no abdominal tenderness. There is no guarding or rebound.  Musculoskeletal:     Cervical back: Neck supple.  Skin:    General: Skin is warm and dry.     Findings: No rash.  Neurological:     General: No focal deficit present.     Mental Status: He is alert and oriented to person, place, and time.     Gait: Gait normal.  Psychiatric:        Mood and Affect: Mood normal.        Behavior: Behavior normal.      UC Treatments / Results  Labs (all labs  ordered are listed, but only abnormal results are displayed) Labs Reviewed  SARS CORONAVIRUS 2 (TAT 6-24 HRS)    EKG   Radiology DG Chest 2 View  Result Date: 12/18/2019 CLINICAL DATA:  Cough. EXAM: CHEST - 2 VIEW COMPARISON:  12/08/2019 FINDINGS: Stable scarring at the lung apices with nodular opacities at the left lung apex. These are essentially stable from prior study. There is stable scarring in the medial left lower lobe. There is no pneumothorax or large pleural effusion. The heart size is stable. There is no acute osseous abnormality. IMPRESSION: No acute cardiopulmonary process. Electronically Signed   By: Constance Holster M.D.   On: 12/18/2019 15:03    Procedures Procedures (including critical care time)  Medications Ordered in UC Medications - No data to display  Initial Impression / Assessment and Plan / UC Course  I have reviewed the triage vital signs and the nursing notes.  Pertinent labs & imaging results that were available during my care of the patient were reviewed by me and considered in my medical decision making (see chart for details).   Productive cough.  Chest x-ray negative for acute process.  Treating with Tessalon Perles.  PCR COVID pending.  Instructed patient to self quarantine until the test result is back.  Instructed him to go to the ED if he has acute worsening symptoms.  Instructed him to follow-up with his PCP next week.  Patient agrees to plan of care.   Final Clinical Impressions(s) / UC Diagnoses   Final diagnoses:  Productive cough     Discharge Instructions     Take the Tessalon Perles as directed.    Your COVID test is pending.  You  should self quarantine until the test result is back.    Take Tylenol as needed for fever or discomfort.  Rest and keep yourself hydrated.    Go to the emergency department if you develop acute worsening symptoms.    Follow up with your primary care provider if your symptoms are not improving.        ED Prescriptions    Medication Sig Dispense Auth. Provider   benzonatate (TESSALON) 100 MG capsule Take 1 capsule (100 mg total) by mouth every 8 (eight) hours. 21 capsule Sharion Balloon, NP     PDMP not reviewed this encounter.   Sharion Balloon, NP 12/18/19 [phone]

## 2019-12-18 NOTE — Telephone Encounter (Signed)
Dr. Loanne Drilling  The pts insurance will not cover the Incruse that was sent in but they stated that they will cover the following:  spiriva--capsules or handihaler anoro symbicort trelegy stiolto breo tudorza  Would you like to change to one of these medications?  Thanks

## 2019-12-18 NOTE — ED Triage Notes (Signed)
Pt presents for cough that developed overnight.  States he has been seen for this at [facility] previously and states the medicine he takes helps but his pulmonologist won't give him any medicine.  States sometimes he coughs so much it is hard to breath. Cough productive for thin yellow mucous.  Pt does have hx of TB. Pt had COVID in April.

## 2019-12-18 NOTE — Discharge Instructions (Addendum)
Take the [facility] as directed.    Your COVID test is pending.  You should self quarantine until the test result is back.    Take Tylenol as needed for fever or discomfort.  Rest and keep yourself hydrated.    Go to the emergency department if you develop acute worsening symptoms.    Follow up with your primary care provider if your symptoms are not improving.

## 2019-12-19 LAB — SARS CORONAVIRUS 2 (TAT 6-24 HRS): SARS Coronavirus 2: NEGATIVE

## 2019-12-21 ENCOUNTER — Emergency Department (HOSPITAL_COMMUNITY): Payer: 59

## 2019-12-21 ENCOUNTER — Encounter (HOSPITAL_COMMUNITY): Payer: Self-pay | Admitting: *Deleted

## 2019-12-21 ENCOUNTER — Emergency Department (HOSPITAL_COMMUNITY)
Admission: EM | Admit: 2019-12-21 | Discharge: 2019-12-21 | Disposition: A | Discharge status: Home / Self Care | Payer: 59 | Attending: Emergency Medicine | Admitting: Emergency Medicine

## 2019-12-21 ENCOUNTER — Other Ambulatory Visit: Payer: Self-pay

## 2019-12-21 DIAGNOSIS — E119 Type 2 diabetes mellitus without complications: Secondary | ICD-10-CM | POA: Insufficient documentation

## 2019-12-21 DIAGNOSIS — J40 Bronchitis, not specified as acute or chronic: Secondary | ICD-10-CM | POA: Insufficient documentation

## 2019-12-21 DIAGNOSIS — Z87891 Personal history of nicotine dependence: Secondary | ICD-10-CM | POA: Diagnosis not present

## 2019-12-21 DIAGNOSIS — Z794 Long term (current) use of insulin: Secondary | ICD-10-CM | POA: Insufficient documentation

## 2019-12-21 DIAGNOSIS — J209 Acute bronchitis, unspecified: Secondary | ICD-10-CM

## 2019-12-21 DIAGNOSIS — R05 Cough: Secondary | ICD-10-CM | POA: Diagnosis present

## 2019-12-21 MED ORDER — AZITHROMYCIN 250 MG PO TABS: ORAL_TABLET | ORAL | 0 refills | Status: DC

## 2019-12-21 MED ORDER — PREDNISONE 10 MG PO TABS
20.0000 mg | ORAL_TABLET | Freq: Two times a day (BID) | ORAL | 0 refills | Status: DC
Start: 2019-12-21 — End: 2020-05-26

## 2019-12-21 NOTE — ED Notes (Signed)
Pt d/c by MD, Pt is provided w/ d/c instructions and follow up care,Pt is out of the ED ambulatory

## 2019-12-21 NOTE — Telephone Encounter (Signed)
Please change inhaler to Spiriva 2.5 mcg 2 puffs daily

## 2019-12-21 NOTE — ED Provider Notes (Signed)
[facility] EMERGENCY DEPARTMENT Provider Note   CSN: [id number] Arrival date & time: 12/21/19  0645     History Chief Complaint  Patient presents with  . Cough    Jose Snow is a 49 y.o. male.  Patient is a 49 year old male with history of cirrhosis, diabetes, and tuberculosis.  He presents today for evaluation of cough.  This is been ongoing for the past several weeks.  He has been seen at urgent care with similar complaints.  He had a Covid test which was negative.  His cough is intermittently productive of white sputum.  He denies fevers or chills.  He denies chest pain or difficulty breathing.  The history is provided by the patient.       Past Medical History:  Diagnosis Date  . Cirrhosis ([place])   . Diabetes mellitus ([place])   . Hepatitis B   . Liver cirrhosis secondary to NASH ([place])   . Lymphocytic colitis   . Pancytopenia ([place])   . 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     Patient Active Problem List   Diagnosis Date Noted  . Chronic cough 12/08/2019  . Pancytopenia ([place]) 12/03/2017  . Abnormal echocardiogram 12/03/2017  . New onset type 2 diabetes mellitus ([place]) 12/03/2017  . History of hepatitis B 12/03/2017  . Esophageal varices without bleeding ([place])   . Heart murmur 08/01/2017  . Hepatitis B 02/17/2015  . Cirrhosis of liver due to hepatitis B ([place]) 11/25/2014  . Thrombocytopenia ([place]) 11/25/2014  . History of TB (tuberculosis) 07/16/2010    Past Surgical History:  Procedure Laterality Date  . BIOPSY  10/23/2017   Procedure: BIOPSY;  Surgeon: Milus Banister, MD;  Location: [facility];  Service: Endoscopy;;  . BIOPSY  03/03/2018   Procedure: BIOPSY;  Surgeon: Irving Copas., MD;  Location: [facility];  Service: Gastroenterology;;  . COLONOSCOPY WITH PROPOFOL N/A 10/23/2017   Procedure: COLONOSCOPY WITH PROPOFOL;  Surgeon: Milus Banister, MD;  Location: [facility];  Service: Endoscopy;  Laterality:  N/A;  . COMPLEX WOUND CLOSURE Right 12/15/2015   Procedure: COMPLEX WOUND CLOSURE;  Surgeon: Leanora Cover, MD;  Location: [facility];  Service: Orthopedics;  Laterality: Right;  . ESOPHAGOGASTRODUODENOSCOPY (EGD) WITH PROPOFOL N/A 10/23/2017   Procedure: ESOPHAGOGASTRODUODENOSCOPY (EGD) WITH PROPOFOL;  Surgeon: Milus Banister, MD;  Location: [facility];  Service: Endoscopy;  Laterality: N/A;  . ESOPHAGOGASTRODUODENOSCOPY (EGD) WITH PROPOFOL N/A 03/03/2018   Procedure: ESOPHAGOGASTRODUODENOSCOPY (EGD) WITH PROPOFOL;  Surgeon: Rush Landmark Telford Nab., MD;  Location: [place];  Service: Gastroenterology;  Laterality: N/A;  . I & D EXTREMITY Right 12/15/2015   Procedure: IRRIGATION AND DEBRIDEMENT AND REVISION AMPUTATION RIGHT RING FINGER;  Surgeon: Leanora Cover, MD;  Location: [facility];  Service: Orthopedics;  Laterality: Right;       Family History  Problem Relation Age of Onset  . Colon cancer Neg Hx   . Esophageal cancer Neg Hx   . Pancreatic cancer Neg Hx   . Stomach cancer Neg Hx   . Liver disease Neg Hx     Social History   Tobacco Use  . Smoking status: Former Smoker    Packs/day: 0.50    Years: 8.00    Pack years: 4.00    Types: Cigarettes    Quit date: 04/08/1990    Years since quitting: 29.7  . Smokeless tobacco: Never Used  Vaping Use  . Vaping Use: Never used  Substance Use Topics  .  Alcohol use: No  . Drug use: No    Home Medications Prior to Admission medications   Medication Sig Start Date End Date Taking? Authorizing Provider  B-D UF III MINI PEN NEEDLES 31G X 5 MM MISC daily. use as directed 06/21/19   [provider]  benzonatate (TESSALON) 100 MG capsule Take 1 capsule (100 mg total) by mouth every 8 (eight) hours. 12/18/19   Sharion Balloon, NP  insulin glargine (LANTUS) 100 UNIT/ML injection Inject 6 Units into the skin daily.     [provider]  lactulose (CHRONULAC) 10 GM/15ML solution Take 45 mLs (30 g total) by mouth daily. 05/14/18   Horald Pollen, MD  metFORMIN (GLUCOPHAGE) 500 MG tablet Take 500 mg by mouth daily. 05/07/19   [provider]  nadolol (CORGARD) 20 MG tablet TAKE 1 TABLET(20 MG) BY MOUTH DAILY 12/06/19   Milus Banister, MD  omeprazole (PRILOSEC) 40 MG capsule Take 1 capsule (40 mg total) by mouth daily before breakfast. 08/19/19   Horald Pollen, MD  ondansetron (ZOFRAN ODT) 4 MG disintegrating tablet Take 1 tablet (4 mg total) by mouth every 8 (eight) hours as needed for nausea or vomiting. 07/20/19   Wieters, Hallie C, PA-C  promethazine-dextromethorphan (PROMETHAZINE-DM) 6.25-15 MG/5ML syrup Take 5 mLs by mouth 3 (three) times daily - between meals and at bedtime. Patient not taking: Reported on 12/08/2019 11/15/19   Scot Jun, FNP  spironolactone (ALDACTONE) 100 MG tablet Take 1 tablet (100 mg total) by mouth daily. 11/22/19   Milus Banister, MD  tenofovir (VIREAD) 300 MG tablet Take 1 tablet (300 mg total) by mouth daily. 10/29/19   Golden Circle, FNP  umeclidinium bromide (INCRUSE ELLIPTA) 62.5 MCG/INH AEPB Inhale 1 puff into the lungs daily. 12/08/19   Margaretha Seeds, MD    Allergies    Other  Review of Systems   Review of Systems  All other systems reviewed and are negative.   Physical Exam Updated Vital Signs BP (!) 103/51 (BP Location: Left Arm)   Pulse (!) 59   Temp 98.1 F (36.7 C) (Oral)   Resp 18   Ht 5' 2"  (1.575 m)   Wt 51.7 kg   SpO2 100%   BMI 20.85 kg/m   Physical Exam Vitals and nursing note reviewed.  Constitutional:      General: He is not in acute distress.    Appearance: He is well-developed. He is not diaphoretic.  HENT:     Head: Normocephalic and atraumatic.  Cardiovascular:     Rate and Rhythm: Normal rate and regular rhythm.     Heart sounds: No murmur heard.  No friction rub.  Pulmonary:     Effort: Pulmonary effort is normal. No respiratory distress.     Breath sounds: Normal breath sounds. No wheezing or rales.  Abdominal:      General: Bowel sounds are normal. There is no distension.     Palpations: Abdomen is soft.     Tenderness: There is no abdominal tenderness.  Musculoskeletal:        General: Normal range of motion.     Cervical back: Normal range of motion and neck supple.  Skin:    General: Skin is warm and dry.  Neurological:     Mental Status: He is alert and oriented to person, place, and time.     Coordination: Coordination normal.     ED Results / Procedures / Treatments   Labs (all  labs ordered are listed, but only abnormal results are displayed) Labs Reviewed - No data to display  EKG None  Radiology DG Chest 2 View  Result Date: 12/21/2019 CLINICAL DATA:  Cough EXAM: CHEST - 2 VIEW COMPARISON:  December 18, 2019; July 20, 2019 FINDINGS: There is asymmetric pleural thickening in the left upper lobe with scattered areas of nodularity in the left upper lobe. Milder apical pleural thickening is noted on the right. There are scattered calcified granulomas. No edema or airspace opacity. There is scarring in the left base which is stable. No edema or airspace opacity. Heart size and pulmonary vascularity are normal. No adenopathy. No bone lesions. IMPRESSION: Apical pleural thickening bilaterally, more severe on the left than on the right. Stable scarring left base. Areas of stable nodularity in granulomatous disease remain. No new opacity evident. Cardiac silhouette within normal limits. Electronically Signed   By: Lowella Grip III M.D.   On: 12/21/2019 18:24    Procedures Procedures (including critical care time)  Medications Ordered in ED Medications - No data to display  ED Course  I have reviewed the triage vital signs and the nursing notes.  Pertinent labs & imaging results that were available during my care of the patient were reviewed by me and considered in my medical decision making (see chart for details).    MDM Rules/Calculators/A&P  Patient presented here with  complaints of cough for the past several weeks not improving with medications prescribed by urgent care.  Patient's chest x-ray today shows no acute abnormality.  His lungs are clear and oxygen saturations are 100%.  He had a Covid test that was -2 days ago and I do not feel as though repeating this is necessary.  Patient will be treated for bronchitis with Zithromax and prednisone.  He is to continue use of his inhaler at home and return if symptoms worsen or change.  Final Clinical Impression(s) / ED Diagnoses Final diagnoses:  None    Rx / DC Orders ED Discharge Orders    None       Veryl Speak, MD 12/21/19 [phone]

## 2019-12-21 NOTE — ED Triage Notes (Addendum)
Pt reports having a productive cough, no fever. Denies other symptoms. No distress noted. Pt was seen at [facility] on 9/11 for same, had negative covid test.

## 2019-12-21 NOTE — Telephone Encounter (Signed)
Pt currently in ED will try calling later

## 2019-12-21 NOTE — Discharge Instructions (Addendum)
Begin taking Zithromax and prednisone as prescribed.  Continue use of your albuterol inhaler, 2 puffs every 4 hours as needed.  Take over-the-counter medications as needed for cough.  Follow-up with primary doctor if not improving in the next week.

## 2019-12-22 ENCOUNTER — Encounter: Payer: Self-pay | Admitting: Emergency Medicine

## 2019-12-22 ENCOUNTER — Other Ambulatory Visit: Payer: Self-pay

## 2019-12-22 ENCOUNTER — Telehealth (INDEPENDENT_AMBULATORY_CARE_PROVIDER_SITE_OTHER): Payer: 59 | Admitting: Emergency Medicine

## 2019-12-22 VITALS — Ht 64.0 in | Wt 118.0 lb

## 2019-12-22 DIAGNOSIS — J988 Other specified respiratory disorders: Secondary | ICD-10-CM

## 2019-12-22 DIAGNOSIS — B9789 Other viral agents as the cause of diseases classified elsewhere: Secondary | ICD-10-CM

## 2019-12-22 DIAGNOSIS — R05 Cough: Secondary | ICD-10-CM | POA: Diagnosis not present

## 2019-12-22 DIAGNOSIS — R059 Cough, unspecified: Secondary | ICD-10-CM

## 2019-12-22 NOTE — Telephone Encounter (Signed)
Called interpreter line, no interpreter available at the time of my call.

## 2019-12-22 NOTE — Progress Notes (Signed)
Telemedicine Encounter- SOAP NOTE Established Patient  This telephone encounter was conducted with the patient's (or proxy's) verbal consent via audio telecommunications: yes/no: Yes Patient was instructed to have this encounter in a suitably private space; and to only have persons present to whom they give permission to participate. In addition, patient identity was confirmed by use of name plus two identifiers (DOB and address).  I discussed the limitations, risks, security and privacy concerns of performing an evaluation and management service by telephone and the availability of in person appointments. I also discussed with the patient that there may be a patient responsible charge related to this service. The patient expressed understanding and agreed to proceed.  I spent a total of TIME; 0 MIN TO 60 MIN: 20 minutes talking with the patient or their proxy.  Chief Complaint  Patient presents with  . Cough    per patient a lot since Covid vaccine last Saturday yellow mucus, why it started    Subjective   Jose Snow is a 49 y.o. male established patient. Telephone visit today complaining of cough and some flulike symptoms for several days.  Was seen in the emergency room yesterday.  Negative Covid test.  Fully vaccinated.  Was started on Zithromax and prednisone.  Also taking Tessalon and over-the-counter cough medication.  Interpreter used during this interview.  Denies difficulty breathing, high fever or chills, or any other significant symptoms.  HPI   Patient Active Problem List   Diagnosis Date Noted  . Chronic cough 12/08/2019  . Pancytopenia ([place]) 12/03/2017  . Abnormal echocardiogram 12/03/2017  . New onset type 2 diabetes mellitus ([place]) 12/03/2017  . History of hepatitis B 12/03/2017  . Esophageal varices without bleeding ([place])   . Heart murmur 08/01/2017  . Hepatitis B 02/17/2015  . 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 Thrombocytopenia ([place])  11/25/2014  . History of TB (tuberculosis) 07/16/2010    Past Medical History:  Diagnosis Date  . Cirrhosis ([place])   . Diabetes mellitus ([place])   . Hepatitis B   . Liver cirrhosis secondary to NASH ([place])   . Lymphocytic colitis   . Pancytopenia ([place])   . 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     Current Outpatient Medications  Medication Sig Dispense Refill  . azithromycin (ZITHROMAX Z-PAK) 250 MG tablet 2 po day one, then 1 daily x 4 days 6 tablet 0  . benzonatate (TESSALON) 100 MG capsule Take 1 capsule (100 mg total) by mouth every 8 (eight) hours. 21 capsule 0  . insulin glargine (LANTUS) 100 UNIT/ML injection Inject 6 Units into the skin daily.     . metFORMIN (GLUCOPHAGE) 500 MG tablet Take 500 mg by mouth daily.    . nadolol (CORGARD) 20 MG tablet TAKE 1 TABLET(20 MG) BY MOUTH DAILY 30 tablet 11  . omeprazole (PRILOSEC) 40 MG capsule Take 1 capsule (40 mg total) by mouth daily before breakfast. 30 capsule 6  . ondansetron (ZOFRAN ODT) 4 MG disintegrating tablet Take 1 tablet (4 mg total) by mouth every 8 (eight) hours as needed for nausea or vomiting. 20 tablet 0  . predniSONE (DELTASONE) 10 MG tablet Take 2 tablets (20 mg total) by mouth 2 (two) times daily with a meal. 20 tablet 0  . promethazine-dextromethorphan (PROMETHAZINE-DM) 6.25-15 MG/5ML syrup Take 5 mLs by mouth 3 (three) times daily - between meals and at bedtime. 140 mL 1  . spironolactone (ALDACTONE) 100 MG  tablet Take 1 tablet (100 mg total) by mouth daily. 90 tablet 3  . tenofovir (VIREAD) 300 MG tablet Take 1 tablet (300 mg total) by mouth daily. 30 tablet 5  . B-D UF III MINI PEN NEEDLES 31G X 5 MM MISC daily. use as directed    . lactulose (CHRONULAC) 10 GM/15ML solution Take 45 mLs (30 g total) by mouth daily. (Patient not taking: Reported on 12/22/2019) 240 mL 3  . umeclidinium bromide (INCRUSE ELLIPTA) 62.5 MCG/INH AEPB Inhale 1 puff into the lungs daily. (Patient not taking: Reported on  12/22/2019) 30 each 2   No current facility-administered medications for this visit.    Allergies  Allergen Reactions  . Other     Yellow cough medicine makes him dizzy    Social History   Socioeconomic History  . Marital status: Married    Spouse name: Not on file  . Number of children: 4  . Years of education: Not on file  . Highest education level: Not on file  Occupational History  . Occupation: [employer]    Comment: Builds chair parts  Tobacco Use  . Smoking status: Former Smoker    Packs/day: 0.50    Years: 8.00    Pack years: 4.00    Types: Cigarettes    Quit date: 04/08/1990    Years since quitting: 29.7  . Smokeless tobacco: Never Used  Vaping Use  . Vaping Use: Never used  Substance and Sexual Activity  . Alcohol use: No  . Drug use: No  . Sexual activity: Yes  Other Topics Concern  . 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  Lives with wife and four children.    Social Determinants of Health   Financial Resource Strain:   . Difficulty of Paying Living Expenses: Not on file  Food Insecurity:   . Worried About Charity fundraiser in the Last Year: Not on file  . Ran Out of Food in the Last Year: Not on file  Transportation Needs:   . Lack of Transportation (Medical): Not on file  . Lack of Transportation (Non-Medical): Not on file  Physical Activity:   . Days of Exercise per Week: Not on file  . Minutes of Exercise per Session: Not on file  Stress:   . Feeling of Stress : Not on file  Social Connections:   . Frequency of Communication with Friends and Family: Not on file  . Frequency of Social Gatherings with Friends and Family: Not on file  . Attends Religious Services: Not on file  . Active Member of Clubs or Organizations: Not on file  . Attends Archivist Meetings: Not on file  . Marital Status: Not on file  Intimate Partner Violence:   . Fear of Current or Ex-Partner: Not on file  . Emotionally Abused: Not on  file  . Physically Abused: Not on file  . Sexually Abused: Not on file    Review of Systems  Constitutional: Negative.  Negative for chills and fever.  HENT: Negative.  Negative for sore throat.   Respiratory: Positive for cough. Negative for shortness of breath.   Cardiovascular: Negative.  Negative for chest pain and palpitations.  Gastrointestinal: Negative for abdominal pain, diarrhea, nausea and vomiting.  Genitourinary: Negative.  Negative for dysuria and hematuria.  Skin: Negative.  Negative for rash.  Neurological: Negative for dizziness and headaches.  All other systems reviewed and are negative.   Objective   Vitals  as reported by the patient: Today's Vitals   12/22/19 1626  Weight: 118 lb (53.5 kg)  Height: 5' 4"  (1.626 m)    There are no diagnoses linked to this encounter.  COLA was seen today for cough.  Diagnoses and all orders for this visit:  Cough  Viral respiratory infection    Clinically stable.  No red flag signs or symptoms.  Continue present medications as prescribed in the emergency room yesterday. Advised to contact the office if no better or worse during the next several days.  I discussed the assessment and treatment plan with the patient. The patient was provided an opportunity to ask questions and all were answered. The patient agreed with the plan and demonstrated an understanding of the instructions.   The patient was advised to call back or seek an in-person evaluation if the symptoms worsen or if the condition fails to improve as anticipated.  I provided 20 minutes of non-face-to-face time during this encounter.  Horald Pollen, MD  Primary Care at [facility]

## 2019-12-22 NOTE — Patient Instructions (Signed)
° ° ° °  If you have lab work done today you will be contacted with your lab results within the next 2 weeks.  If you have not heard from us then please contact us. The fastest way to get your results is to register for My Chart. ° ° °IF you received an x-ray today, you will receive an invoice from [facility]. Please contact [facility] at [phone] with questions or concerns regarding your invoice.  ° °IF you received labwork today, you will receive an invoice from [facility]. Please contact [facility] at [phone] with questions or concerns regarding your invoice.  ° °Our billing staff will not be able to assist you with questions regarding bills from these companies. ° °You will be contacted with the lab results as soon as they are available. The fastest way to get your results is to activate your My Chart account. Instructions are located on the last page of this paperwork. If you have not heard from us regarding the results in 2 weeks, please contact this office. °  ° ° ° °

## 2019-12-24 NOTE — Telephone Encounter (Signed)
Attempted to call pt but unable to reach. Left message for him to return call. °

## 2019-12-31 ENCOUNTER — Other Ambulatory Visit: Payer: Self-pay | Admitting: Family

## 2020-02-04 ENCOUNTER — Ambulatory Visit (INDEPENDENT_AMBULATORY_CARE_PROVIDER_SITE_OTHER): Payer: 59 | Admitting: Pulmonary Disease

## 2020-02-04 ENCOUNTER — Other Ambulatory Visit: Payer: Self-pay

## 2020-02-04 ENCOUNTER — Encounter: Payer: Self-pay | Admitting: Pulmonary Disease

## 2020-02-04 VITALS — BP 100/60 | HR 66 | Temp 97.8°F | Ht 62.5 in | Wt 116.0 lb

## 2020-02-04 DIAGNOSIS — J449 Chronic obstructive pulmonary disease, unspecified: Secondary | ICD-10-CM

## 2020-02-04 DIAGNOSIS — R053 Chronic cough: Secondary | ICD-10-CM | POA: Diagnosis not present

## 2020-02-04 DIAGNOSIS — R0602 Shortness of breath: Secondary | ICD-10-CM

## 2020-02-04 LAB — PULMONARY FUNCTION TEST
DL/VA % pred: 124 %
DL/VA: 5.1 ml/min/mmHg/L
DLCO cor % pred: 90 %
DLCO cor: 20.04 ml/min/mmHg
DLCO unc % pred: 90 %
DLCO unc: 20.04 ml/min/mmHg
FEF 25-75 Post: 1.14 L/sec
FEF 25-75 Pre: 1.06 L/sec
FEF2575-%Change-Post: 7 %
FEF2575-%Pred-Post: 41 %
FEF2575-%Pred-Pre: 38 %
FEV1-%Change-Post: 1 %
FEV1-%Pred-Post: 61 %
FEV1-%Pred-Pre: 60 %
FEV1-Post: 1.73 L
FEV1-Pre: 1.7 L
FEV1FVC-%Change-Post: 1 %
FEV1FVC-%Pred-Pre: 81 %
FEV6-%Change-Post: 0 %
FEV6-Post: 2.59 L
FEV6-Pre: 2.58 L
FVC-%Change-Post: 0 %
FVC-%Pred-Post: 74 %
FVC-%Pred-Pre: 74 %
FVC-Post: 2.59 L
FVC-Pre: 2.58 L
Post FEV1/FVC ratio: 67 %
Post FEV6/FVC ratio: 100 %
Pre FEV1/FVC ratio: 66 %
Pre FEV6/FVC Ratio: 100 %
RV % pred: 159 %
RV: 2.62 L
TLC % pred: 95 %
TLC: 5.21 L

## 2020-02-04 MED ORDER — ANORO ELLIPTA 62.5-25 MCG/INH IN AEPB
1.0000 | INHALATION_SPRAY | Freq: Every day | RESPIRATORY_TRACT | 0 refills | Status: DC
Start: 2020-02-04 — End: 2022-01-31

## 2020-02-04 MED ORDER — ANORO ELLIPTA 62.5-25 MCG/INH IN AEPB: 1.0000 | INHALATION_SPRAY | Freq: Every day | RESPIRATORY_TRACT | 6 refills | Status: DC

## 2020-02-04 MED ORDER — ALBUTEROL SULFATE HFA 108 (90 BASE) MCG/ACT IN AERS: 1.0000 | INHALATION_SPRAY | RESPIRATORY_TRACT | 6 refills | Status: DC | PRN

## 2020-02-04 NOTE — Patient Instructions (Addendum)
Moderate COPD --START Anoro ONE puff ONCE a day. This is your EVERYDAY inhaler --CONTINUE Albuterol as needed for shortness of breath or wheezing. This is your EMERGENCY inhaler  Follow-up with me in 3 months

## 2020-02-04 NOTE — Progress Notes (Signed)
Full PFT performed today. °

## 2020-02-04 NOTE — Progress Notes (Signed)
Subjective:   PATIENT ID: Jose Snow GENDER: male DOB: 1970/08/20, MRN: [id number]   HPI  Chief Complaint  Patient presents with  . Follow-up    Patient feels good overall, states he had cough a couple weeks ago but is now gone    Reason for Visit: PFT follow-up  [name] is a 49 year old former smoker with cirrhosis secondary to hepatitis B, hx TB and prior covid pneumonia in 07/2019 who presents for follow-up. Interpreter present.  He was recently seen in Urgent Care for cough on 9/11 ED on 9/14 and treated for bronchitis with prednisone and z-pack. On our last visit he was prescribed Incruse but reports this was not approved by his insurance so he has not been on any inhaler. He continues to have baseline productive cough with brown sputum, shortness of breath and wheezing that is worse at night.  Social: He has worked with the [employer] for >20 years. He is still with the company but now works in a different department. He previously smoked for at least 10 years and smoked every day. Stopped smoking in 1993.   I have personally reviewed patient's past medical/family/social history/allergies/current medications.  Past Medical History:  Diagnosis Date  . Cirrhosis ([place])   . Diabetes mellitus ([place])   . Hepatitis B   . Liver cirrhosis secondary to NASH ([place])   . Lymphocytic colitis   . Pancytopenia ([place])   . 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      Allergies  Allergen Reactions  . Other     Yellow cough medicine makes him dizzy     Outpatient Medications Prior to Visit  Medication Sig Dispense Refill  . azithromycin (ZITHROMAX Z-PAK) 250 MG tablet 2 po day one, then 1 daily x 4 days 6 tablet 0  . B-D UF III MINI PEN NEEDLES 31G X 5 MM MISC daily. use as directed    . benzonatate (TESSALON) 100 MG capsule Take 1 capsule (100 mg total) by mouth every 8 (eight) hours. 21 capsule 0  . insulin glargine (LANTUS) 100 UNIT/ML injection  Inject 6 Units into the skin daily.     Marland Kitchen lactulose (CHRONULAC) 10 GM/15ML solution Take 45 mLs (30 g total) by mouth daily. 240 mL 3  . metFORMIN (GLUCOPHAGE) 500 MG tablet Take 500 mg by mouth daily.    . nadolol (CORGARD) 20 MG tablet TAKE 1 TABLET(20 MG) BY MOUTH DAILY 30 tablet 11  . omeprazole (PRILOSEC) 40 MG capsule Take 1 capsule (40 mg total) by mouth daily before breakfast. 30 capsule 6  . ondansetron (ZOFRAN ODT) 4 MG disintegrating tablet Take 1 tablet (4 mg total) by mouth every 8 (eight) hours as needed for nausea or vomiting. 20 tablet 0  . predniSONE (DELTASONE) 10 MG tablet Take 2 tablets (20 mg total) by mouth 2 (two) times daily with a meal. 20 tablet 0  . promethazine-dextromethorphan (PROMETHAZINE-DM) 6.25-15 MG/5ML syrup Take 5 mLs by mouth 3 (three) times daily - between meals and at bedtime. 140 mL 1  . spironolactone (ALDACTONE) 100 MG tablet Take 1 tablet (100 mg total) by mouth daily. 90 tablet 3  . tenofovir (VIREAD) 300 MG tablet TAKE 1 TABLET BY MOUTH DAILY 30 tablet 5  . umeclidinium bromide (INCRUSE ELLIPTA) 62.5 MCG/INH AEPB Inhale 1 puff into the lungs daily. 30 each 2   No facility-administered medications prior to visit.    Review of Systems  Constitutional:  Negative for chills, diaphoresis, fever, malaise/fatigue and weight loss.  HENT: Negative for congestion.   Respiratory: Positive for cough, shortness of breath and wheezing. Negative for hemoptysis and sputum production.   Cardiovascular: Negative for chest pain, palpitations and leg swelling.     Objective:   Vitals:   02/04/20 1006  BP: 100/60  Pulse: 66  Temp: 97.8 F (36.6 C)  TempSrc: Temporal  SpO2: 100%  Weight: 116 lb (52.6 kg)  Height: 5' 2.5" (1.588 m)   SpO2: 100 % O2 Device: None (Room air)  Physical Exam: General: Well-appearing, no acute distress HENT: Jose Snow, AT, OP clear, MMM Eyes: EOMI, no scleral icterus Respiratory: Diminished bibasilar breath sounds.  No crackles,  wheezing or rales Cardiovascular: RRR, -M/R/G, no JVD Extremities:-Edema,-tenderness Neuro: AAO x4, CNII-XII grossly intact Skin: Intact, no rashes or bruising Psych: Normal mood, normal affect  Data Reviewed:  Imaging: CXR 12/08/19 - Emphysema, bilateral upper lobe scarring  PFT: 02/04/20 FVC 2.59 (74%) FEV1 1.73 (61%) Ratio 66  TLC 95% DLCO 90% Interpretation: Moderate obstructive defect present. No significant BD response   Labs: CBC    Component Value Date/Time   WBC 2.9 (L) 12/01/2019 1153   RBC 3.69 (L) 12/01/2019 1153   HGB 12.4 (L) 12/01/2019 1153   HGB 10.8 (L) 12/03/2017 1632   HCT 35.3 (L) 12/01/2019 1153   HCT 33.0 (L) 12/03/2017 1632   PLT 27.0 Repeated and verified X2. (LL) 12/01/2019 1153   PLT 24 (LL) 12/03/2017 1632   MCV 95.6 12/01/2019 1153   MCV 97.6 08/20/2018 1644   MCV 103 (H) 12/03/2017 1632   MCH 33.3 06/08/2019 0815   MCHC 35.2 12/01/2019 1153   RDW 14.7 12/01/2019 1153   RDW 15.9 (H) 12/03/2017 1632   LYMPHSABS 0.6 (L) 06/08/2019 0815   LYMPHSABS 0.9 12/03/2017 1632   MONOABS 0.1 06/08/2019 0815   EOSABS 0.1 06/08/2019 0815   EOSABS 0.1 12/03/2017 1632   BASOSABS 0.0 06/08/2019 0815   BASOSABS 0.0 12/03/2017 1632     Imaging, labs and test noted above have been reviewed independently by me.  Assessment & Plan:   Discussion: 49 year old male former smoker who presents with COPD with chronic bronchitis. Reviewed PFTs in-clinic which show moderate obstructive defect. Symptoms are currently uncontrolled and he has had multiple exacerbations this year. Counseled and educated on inhaler use.  Moderate COPD --START Anoro ONE puff ONCE a day. This is your EVERYDAY inhaler --CONTINUE Albuterol as needed for shortness of breath or wheezing. This is your EMERGENCY inhaler  Follow-up with me in 3 months  Health Maintenance Immunization History  Administered Date(s) Administered  . Influenza,inj,Quad PF,6+ Mos 01/28/2016, 12/29/2017, 12/21/2018   . Moderna SARS-COVID-2 Vaccination 11/19/2019, 12/18/2019  . Pneumococcal Polysaccharide-23 08/20/2018  . Tdap 12/29/2017   CT Lung Screen - not qualified  No orders of the defined types were placed in this encounter.  Meds ordered this encounter  Medications  . umeclidinium-vilanterol (ANORO ELLIPTA) 62.5-25 MCG/INH AEPB    Sig: Inhale 1 puff into the lungs daily.    Dispense:  1 each    Refill:  6  . albuterol (VENTOLIN HFA) 108 (90 Base) MCG/ACT inhaler    Sig: Inhale 1-2 puffs into the lungs every 4 (four) hours as needed for wheezing or shortness of breath.    Dispense:  1 each    Refill:  6  . umeclidinium-vilanterol (ANORO ELLIPTA) 62.5-25 MCG/INH AEPB    Sig: Inhale 1 puff into the lungs daily.  Dispense:  60 each    Refill:  0    Order Specific Question:   Lot Number?    Answer:   [id number]    Order Specific Question:   Expiration Date?    Answer:   12/07/2020    Order Specific Question:   Manufacturer?    Answer:   [employer] [[id number]]    Order Specific Question:   Quantity    Answer:   1    Return in about 3 months (around 05/06/2020).  I have spent a total time of 35-minutes on the day of the appointment reviewing prior documentation, coordinating care and discussing medical diagnosis and plan with the patient/family. Imaging, labs and tests included in this note have been reviewed and interpreted independently by me.  [place], MD [facility] 02/04/2020 10:21 AM  Office Number [phone]

## 2020-02-13 ENCOUNTER — Encounter: Payer: Self-pay | Admitting: Pulmonary Disease

## 2020-03-22 ENCOUNTER — Other Ambulatory Visit: Payer: Self-pay | Admitting: Emergency Medicine

## 2020-03-22 DIAGNOSIS — K219 Gastro-esophageal reflux disease without esophagitis: Secondary | ICD-10-CM

## 2020-05-05 ENCOUNTER — Encounter: Payer: Self-pay | Admitting: Family

## 2020-05-05 ENCOUNTER — Ambulatory Visit (INDEPENDENT_AMBULATORY_CARE_PROVIDER_SITE_OTHER): Payer: 59 | Admitting: Family

## 2020-05-05 ENCOUNTER — Other Ambulatory Visit: Payer: Self-pay

## 2020-05-05 VITALS — BP 104/56 | HR 59 | Temp 97.9°F | Ht 64.0 in | Wt 129.0 lb

## 2020-05-05 DIAGNOSIS — K7460 Unspecified cirrhosis of liver: Secondary | ICD-10-CM | POA: Diagnosis not present

## 2020-05-05 DIAGNOSIS — B1910 Unspecified viral hepatitis B without hepatic coma: Secondary | ICD-10-CM

## 2020-05-05 DIAGNOSIS — B181 Chronic viral hepatitis B without delta-agent: Secondary | ICD-10-CM

## 2020-05-05 MED ORDER — TENOFOVIR DISOPROXIL FUMARATE 300 MG PO TABS: 300.0000 mg | ORAL_TABLET | Freq: Every day | ORAL | 11 refills | Status: DC
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
## 2020-05-05 NOTE — Assessment & Plan Note (Signed)
Jose Snow has been off medication for about 4-6 weeks and fortunately he has not developed any signs/symptoms of Hepatitis B flare. Discussed importance of letting us know if he has problems with medication. Check blood work today for therapeutic drug monitoring of renal function and Hepatitis B status. Continue current dose of tenofovir. Encouraged to continue with follow ups with [name] and eventually [place]. Plan for follow up in 6 months or sooner if needed.

## 2020-05-05 NOTE — Assessment & Plan Note (Signed)
Appears stable at present with no significant ascites. Previous lab work with continued thrombocytopenia. Has follow up with [name] on 2/18. Continue with medications changes as needed per [name].

## 2020-05-05 NOTE — Patient Instructions (Signed)
Nice to see you.   We will check your lab work today.   Restart taking the Tenofovir - be sure to let us know if you are not able to get it. Missing doses could result in a Hepatitis B flare.   Continue to follow up with [name] for your cirrhosis and also [place].   Plan for follow up in 6 months or sooner if needed.  Have a great day and stay safe!

## 2020-05-10 LAB — HEPATIC FUNCTION PANEL
AG Ratio: 1.6 (calc) (ref 1.0–2.5)
ALT: 21 U/L (ref 9–46)
AST: 27 U/L (ref 10–35)
Albumin: 3.8 g/dL (ref 3.6–5.1)
Alkaline phosphatase (APISO): 56 U/L (ref 35–144)
Bilirubin, Direct: 0.6 mg/dL — ABNORMAL HIGH (ref 0.0–0.2)
Globulin: 2.4 g/dL (calc) (ref 1.9–3.7)
Indirect Bilirubin: 1.4 mg/dL (calc) — ABNORMAL HIGH (ref 0.2–1.2)
Total Bilirubin: 2 mg/dL — ABNORMAL HIGH (ref 0.2–1.2)
Total Protein: 6.2 g/dL (ref 6.1–8.1)

## 2020-05-10 LAB — BASIC METABOLIC PANEL
BUN: 18 mg/dL (ref 7–25)
CO2: 28 mmol/L (ref 20–32)
Calcium: 8.8 mg/dL (ref 8.6–10.3)
Chloride: 106 mmol/L (ref 98–110)
Creat: 0.98 mg/dL (ref 0.70–1.33)
Glucose, Bld: 154 mg/dL — ABNORMAL HIGH (ref 65–99)
Potassium: 4.5 mmol/L (ref 3.5–5.3)
Sodium: 139 mmol/L (ref 135–146)

## 2020-05-10 LAB — PROTIME-INR
INR: 1.2 — ABNORMAL HIGH
Prothrombin Time: 12.4 s — ABNORMAL HIGH (ref 9.0–11.5)

## 2020-05-10 LAB — HEPATITIS B DNA, ULTRAQUANTITATIVE, PCR
Hepatitis B DNA (Calc): <1 Log IU/mL — ABNORMAL HIGH
Hepatitis B DNA: <10 IU/mL — ABNORMAL HIGH

## 2020-05-10 LAB — HEPATITIS B SURFACE ANTIGEN: Hepatitis B Surface Ag: REACTIVE — AB

## 2020-05-26 ENCOUNTER — Encounter: Payer: Self-pay | Admitting: Gastroenterology

## 2020-05-26 ENCOUNTER — Ambulatory Visit (INDEPENDENT_AMBULATORY_CARE_PROVIDER_SITE_OTHER): Payer: 59 | Admitting: Gastroenterology

## 2020-05-26 DIAGNOSIS — K7460 Unspecified cirrhosis of liver: Secondary | ICD-10-CM

## 2020-05-26 DIAGNOSIS — B181 Chronic viral hepatitis B without delta-agent: Secondary | ICD-10-CM | POA: Diagnosis not present
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
## 2020-05-26 NOTE — Patient Instructions (Signed)
If you are age 52 or younger, your body mass index should be between 19-25. Your Body mass index is 20.99 kg/m. If this is out of the aformentioned range listed, please consider follow up with your Primary Care Provider.   You will need an abdominal ultrasound-ATTN: liver in March. We will contact you to schedule the appointment.  You will need a follow up appointment in 1 year (February 2023).  We will contact you to schedule the appointment.  Thank you for entrusting me with your care and choosing [facility].  Dr Ardis Hughs you

## 2020-05-26 NOTE — Addendum Note (Signed)
Addended by: Stevan Born on: 05/26/2020 02:56 PM   Modules accepted: Orders

## 2020-06-14 ENCOUNTER — Telehealth: Payer: Self-pay

## 2020-06-14 NOTE — Telephone Encounter (Signed)
-----   Message from April H Pait sent at 06/14/2020 11:34 AM EST ----- Regarding: RE: abdominal US-ATTN:liver--READY TO SCHEDULE Pt has been called 3x. ----- Message ----- From: Stevan Born, CMA Sent: 06/06/2020  11:40 AM EST To: April H Pait, Roosvelt Maser Subject: FW: abdominal US-ATTN:liver--READY TO SCHEDU#   ----- Message ----- From: Stevan Born, CMA Sent: 05/26/2020   3:00 PM EST To: April H Pait, Roosvelt Maser Subject: abdominal US-ATTN:liver--READY TO SCHEDULE     Patient needs abdominal US-ATTN:liver per Dr Ardis Hughs dx cirrohsis, hep b, hepatoma screening in March 2022.  Thank you,  Elmyra Ricks

## 2020-06-14 NOTE — Telephone Encounter (Signed)
Several attempts to reach patient by phone to schedule abdominal ultrasound attn: liver was made by radiology team.  Message was sent back to me to notify our office that patient was not able to be scheduled.  I contacted Jose Snow (on DPR) to make her aware that patient needs to be scheduled and gave her the phone number for radiology scheduling.  She will contact patient to make him aware.

## 2020-06-27 ENCOUNTER — Ambulatory Visit (HOSPITAL_COMMUNITY)
Admission: RE | Admit: 2020-06-27 | Discharge: 2020-06-27 | Disposition: A | Discharge status: Home / Self Care | Payer: 59 | Source: Ambulatory Visit | Attending: Gastroenterology | Admitting: Gastroenterology

## 2020-06-27 ENCOUNTER — Other Ambulatory Visit: Payer: Self-pay

## 2020-06-27 DIAGNOSIS — B181 Chronic viral hepatitis B without delta-agent: Secondary | ICD-10-CM | POA: Diagnosis present

## 2020-06-27 DIAGNOSIS — K7460 Unspecified cirrhosis of liver: Secondary | ICD-10-CM | POA: Diagnosis not present

## 2020-09-14 ENCOUNTER — Other Ambulatory Visit: Payer: Self-pay | Admitting: Emergency Medicine

## 2020-09-14 DIAGNOSIS — K219 Gastro-esophageal reflux disease without esophagitis: Secondary | ICD-10-CM

## 2020-10-18 ENCOUNTER — Other Ambulatory Visit: Payer: Self-pay | Admitting: Pulmonary Disease

## 2020-10-18 IMAGING — DX DG CHEST 2V
2 series · 2 of 2 positions shown · non-contrast
Comparison: 07/20/2019, 06/08/2019

CLINICAL DATA: Shortness of breath

EXAM:
CHEST - 2 VIEW

[chest pa]
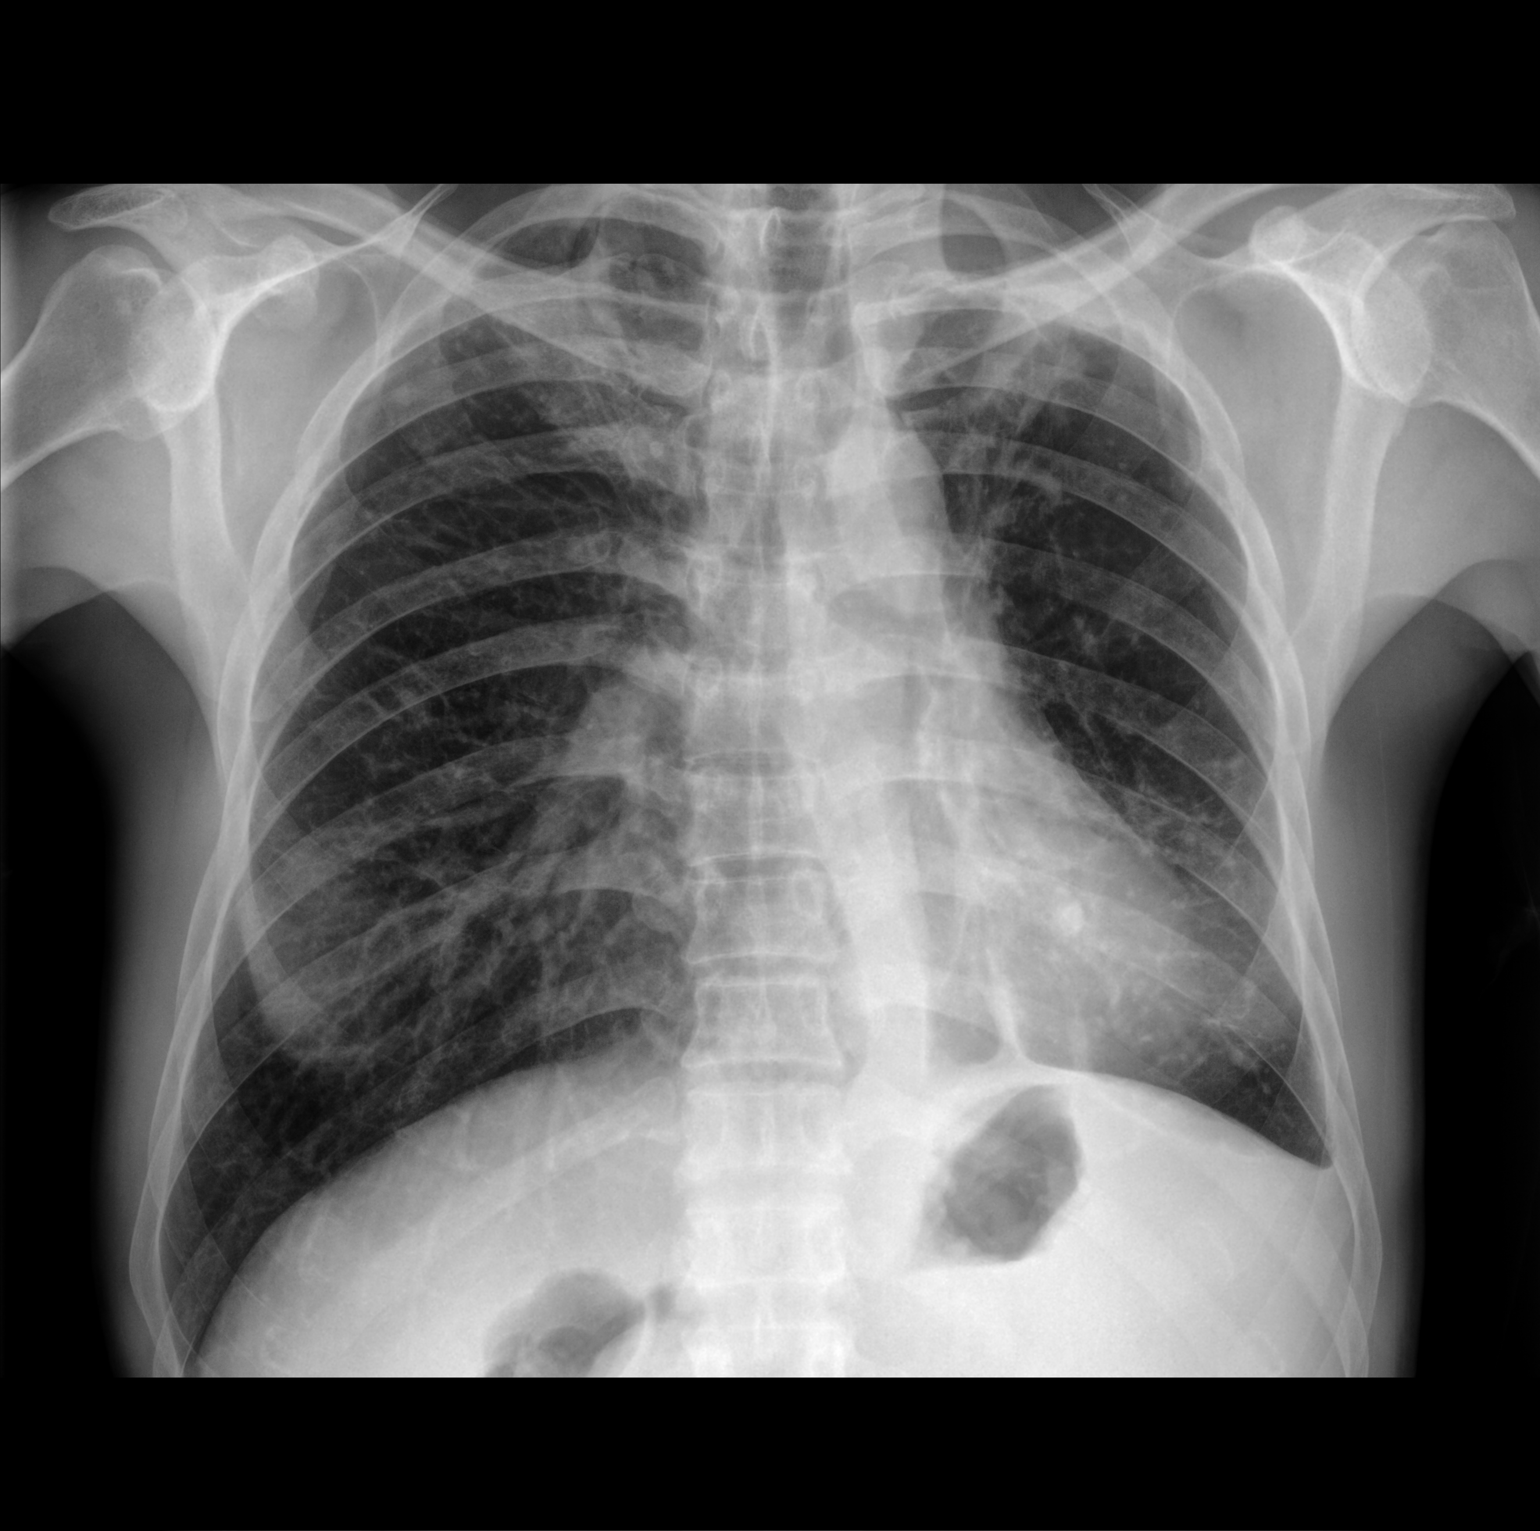

[chest lat]
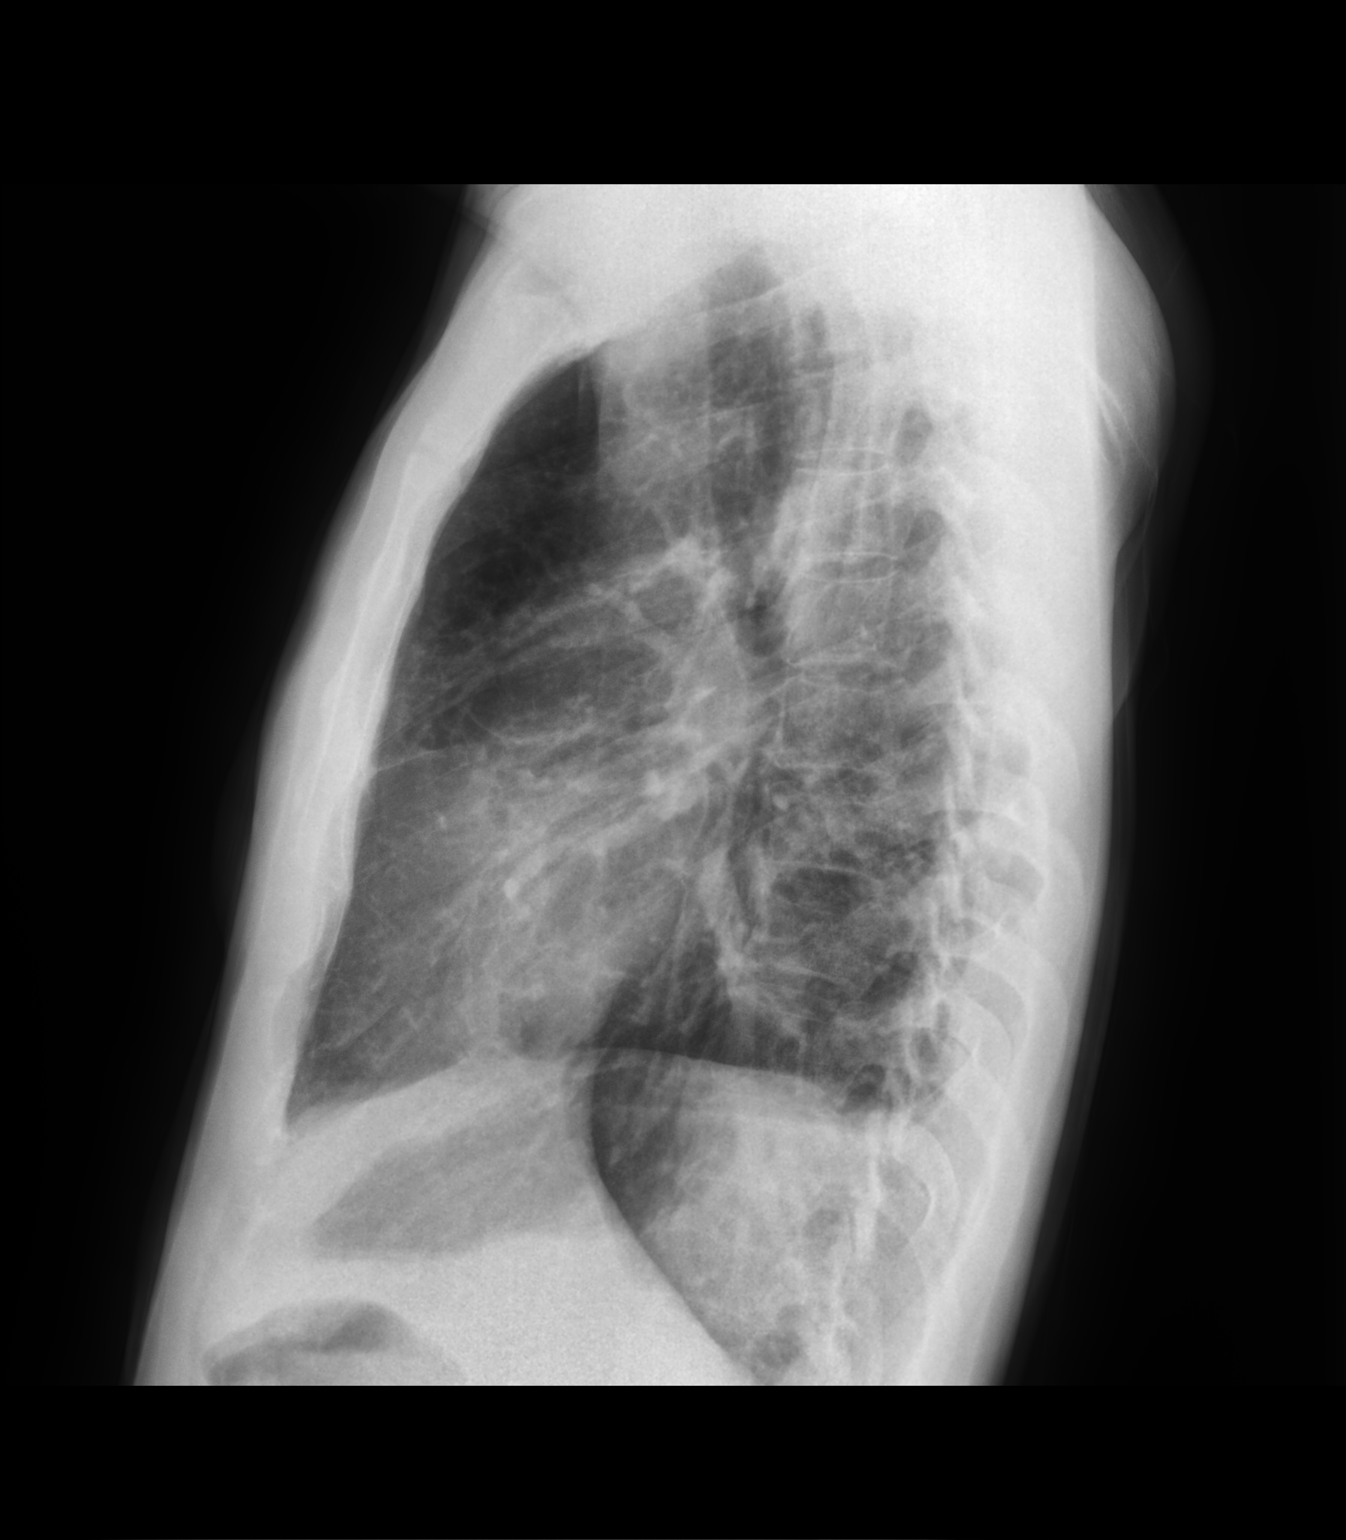

[2 of 2 positions shown; findings below may reference images not displayed]

FINDINGS: Volume loss left thorax. Apical pleural and parenchymal scarring on
the left. Apical scarring on the right and at the left base. No
acute consolidation or effusion. Stable cardiomediastinal
silhouette. No pneumothorax.
IMPRESSION: No active cardiopulmonary disease. Scarring at the apices and left
base.

## 2020-10-18 IMAGING — US US ABDOMEN LIMITED
1 series · 14 of 25 positions shown · non-contrast
Comparison: 06/08/2019

CLINICAL DATA: Cirrhosis.

EXAM:
ULTRASOUND ABDOMEN LIMITED RIGHT UPPER QUADRANT

[Series 1: us abdomen limited · 14 of 42 slices shown]
[im 1/42]
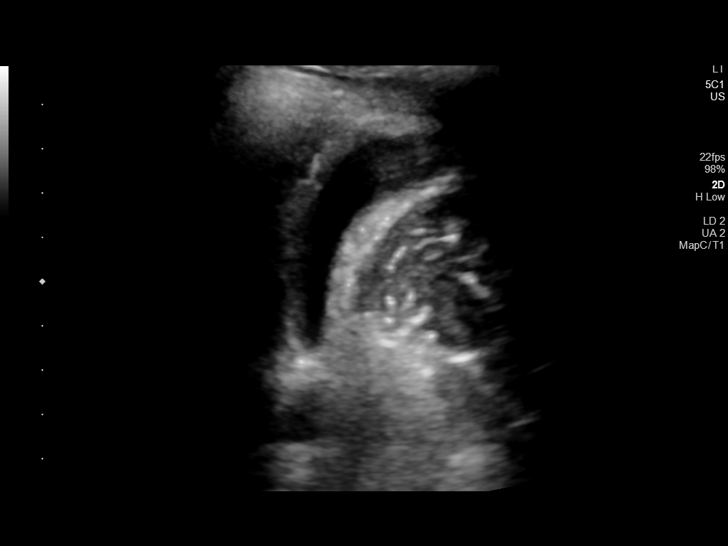
[im 4/42]
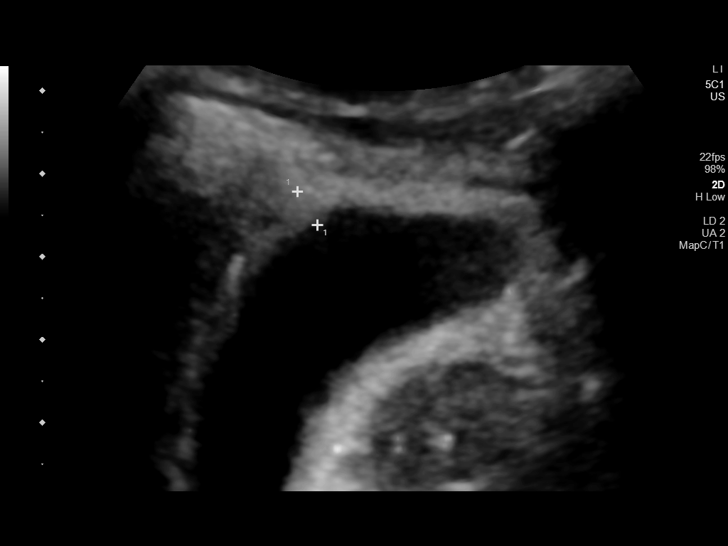
[im 7/42]
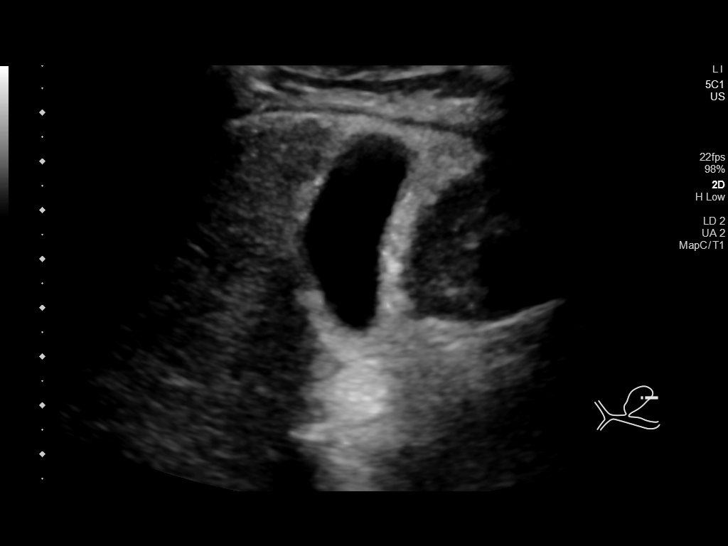
[im 11/42]
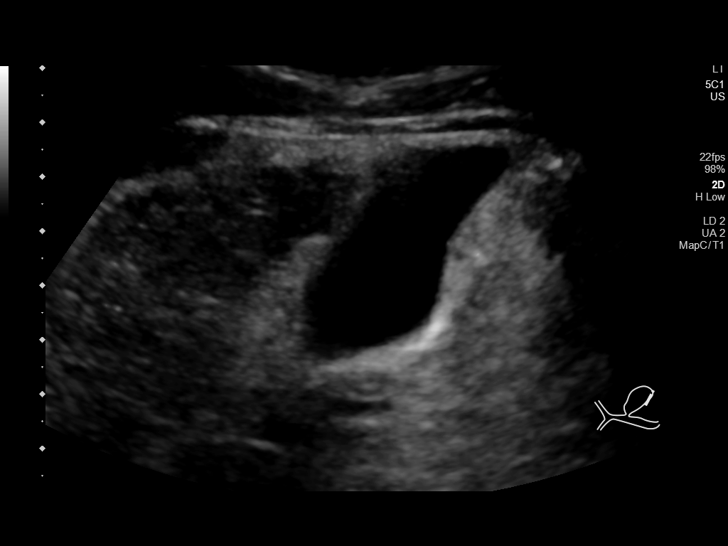
[im 14/42]
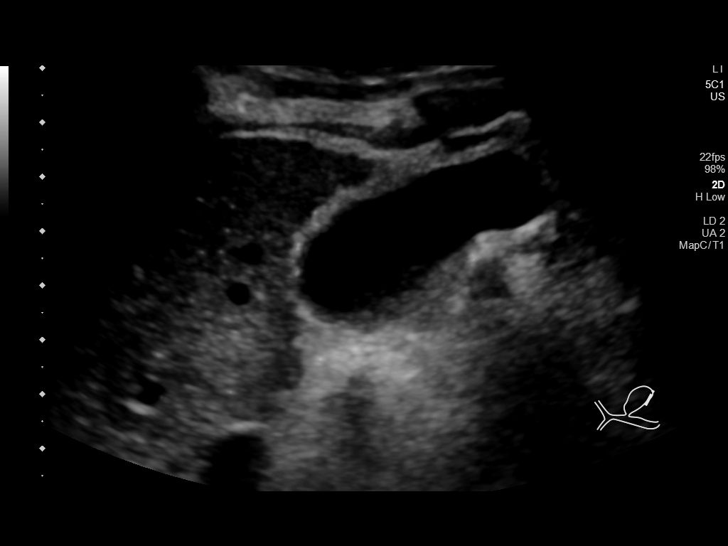
[im 16/42]
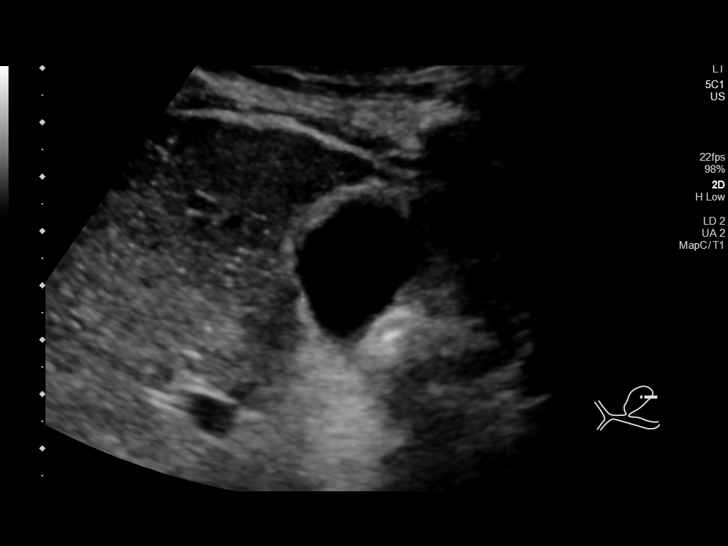
[im 19/42]
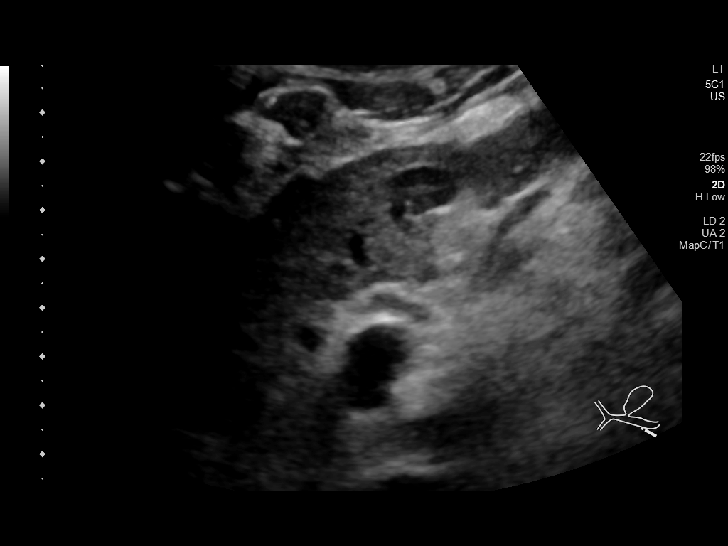
[im 23/42]
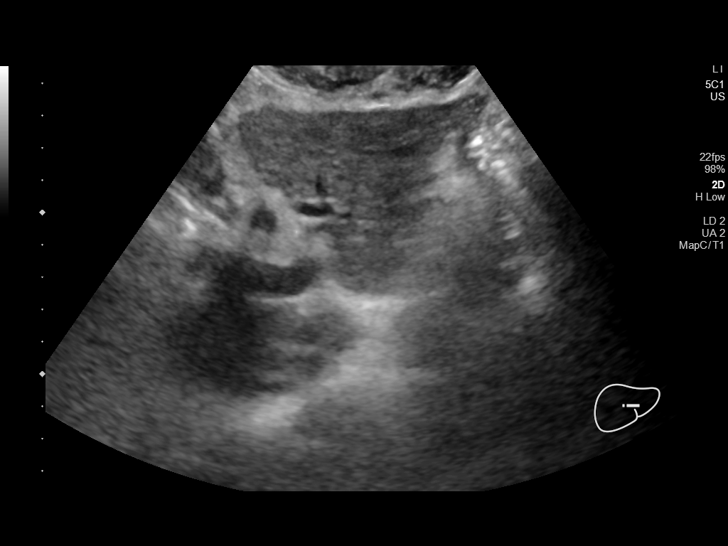
[im 26/42]
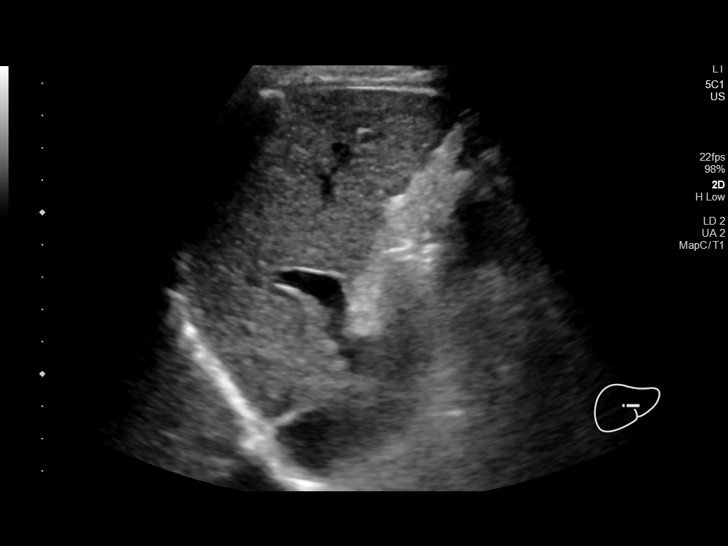
[im 28/42]
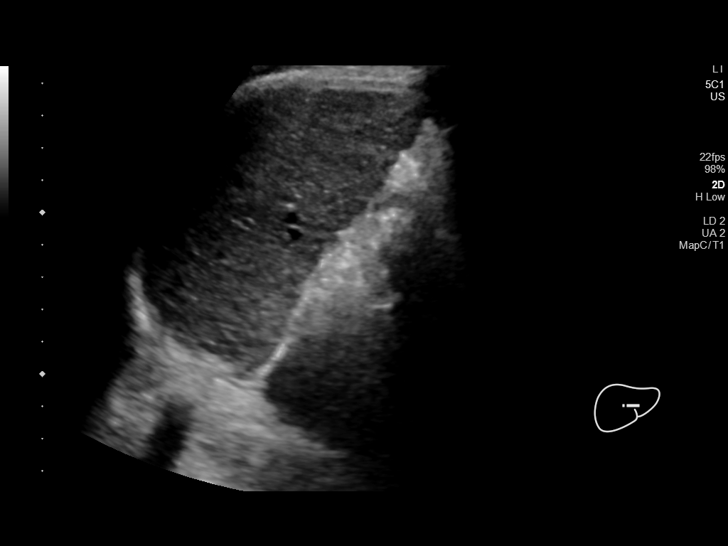
[im 31/42]
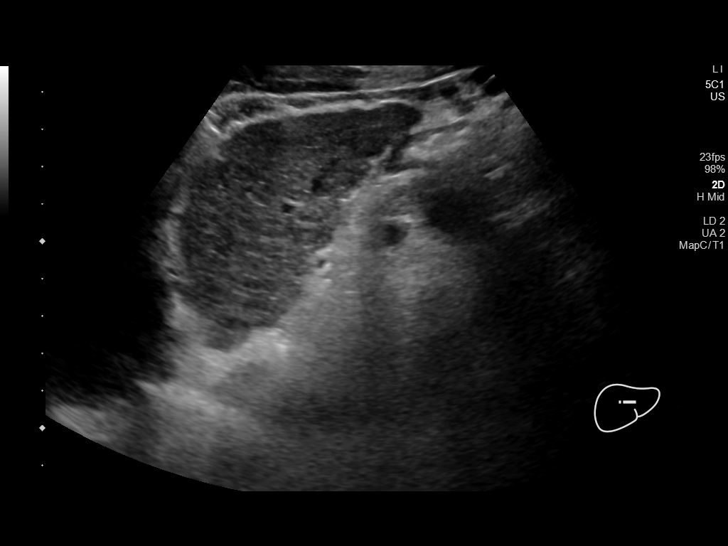
[im 35/42]
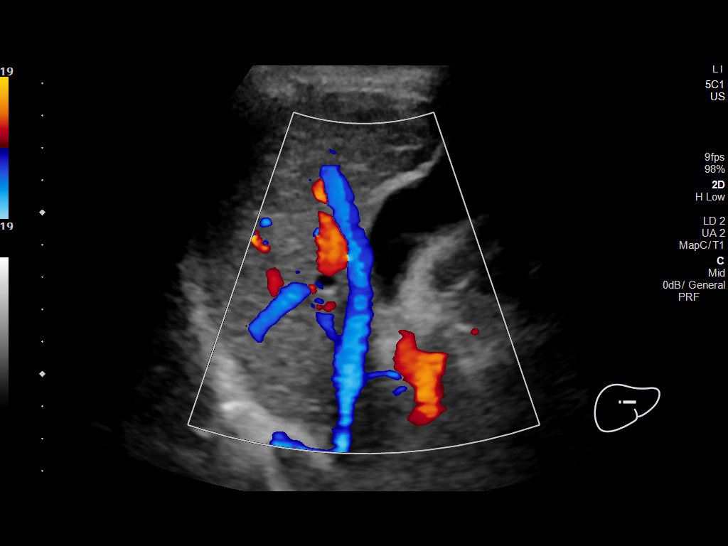
[im 38/42]
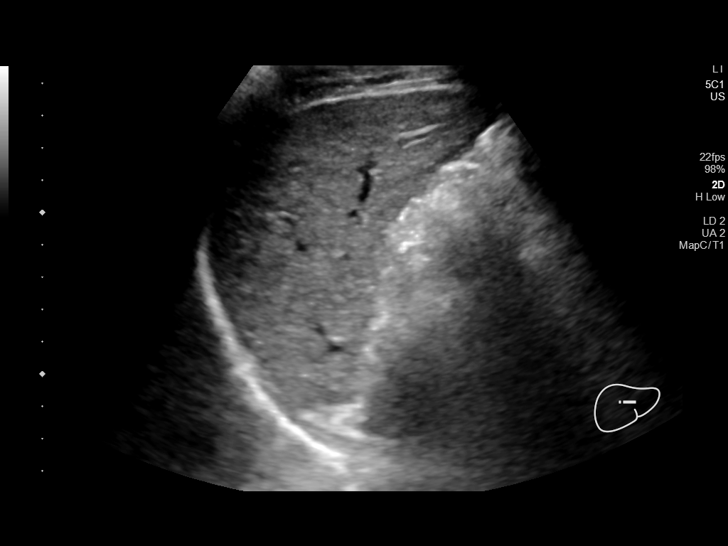
[im 42/42]
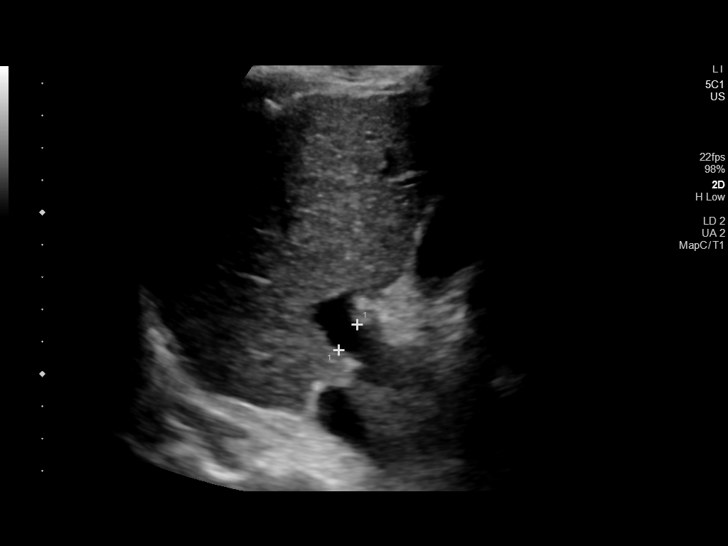

[14 of 25 positions shown; findings below may reference images not displayed]

FINDINGS: Gallbladder:

Persistent mild gallbladder wall thickening which is likely
secondary to chronic liver disease. No gallstones. No sonographic
Murphy sign noted by sonographer.

Common bile duct:

Diameter: 5 mm

Liver:

The liver is small with a nodular contour. No focal lesion is
identified. Portal vein is patent on color Doppler imaging with
normal direction of blood flow towards the liver.

Other: None.
IMPRESSION: Cirrhosis without a focal liver lesion identified.

## 2020-10-18 NOTE — Telephone Encounter (Signed)
  Anoro is not on the patient's preferred list. I looked to see if patient has tried Ipratropium-Albuterol and can not find that he has. Do you want to switch or should we start a PA?  Dr. Loanne Drilling please advise  Thank you

## 2020-10-23 NOTE — Telephone Encounter (Signed)
Please see if INCRUSE or SPIRIVA 2.5 mcg is approved by patient's insurance.

## 2020-10-25 NOTE — Telephone Encounter (Signed)
Please switch patient from Anoro to Advair 100-50 mg ONE puff TWICE a day (based on insurance preferred list). Will need to communicate with patient that this is taken differently compared to his prior inhaler. He also needs a follow-up with since we are approaching one year since our last appointment.  Rodman Pickle, M.D. [facility] Pulmonary/Critical Care Medicine 10/25/2020 7:13 PM

## 2020-10-25 NOTE — Telephone Encounter (Signed)
I called and spoke with the pharmacist Jenny Reichmann at [facility]. He states that Incruse is not on the formulary. He ran the calm for Anoro while we were on the call. The co-pay has went up to $109 now and the [place] would be $122 a month.  Dr. Loanne Drilling please advise  Thank you

## 2020-10-26 NOTE — Telephone Encounter (Signed)
Interpreter services used to contact patient. Interpreter: Lexine Baton [phone] Left message for patient and daughter both to return call to office.

## 2020-10-27 ENCOUNTER — Ambulatory Visit: Payer: 59 | Admitting: Family

## 2020-10-31 IMAGING — DX DG CHEST 2V
2 series · 2 of 2 positions shown · non-contrast
Comparison: [DATE] [DATE], [DATE]; [DATE] [DATE], [DATE]

CLINICAL DATA: Cough

EXAM:
CHEST - 2 VIEW

[chest pa]
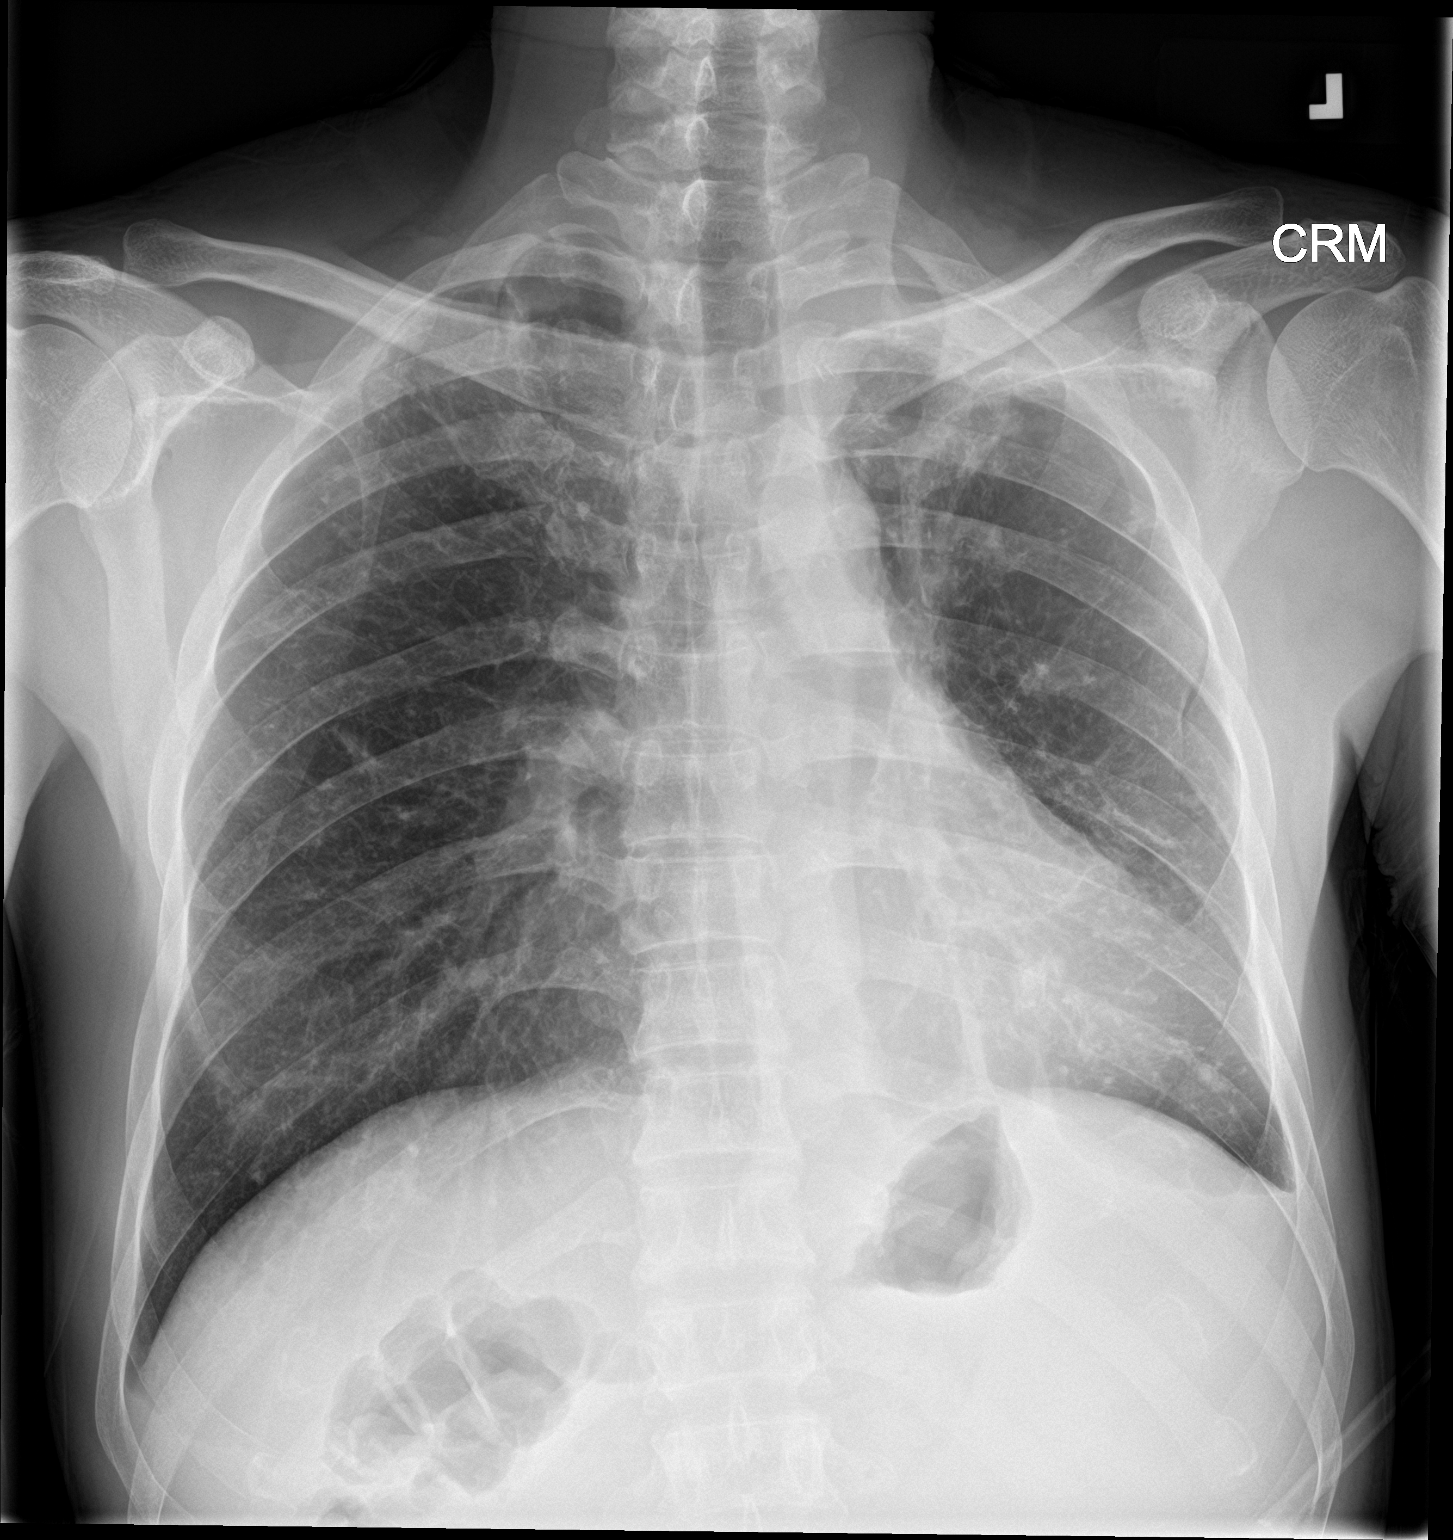

[chest lat]
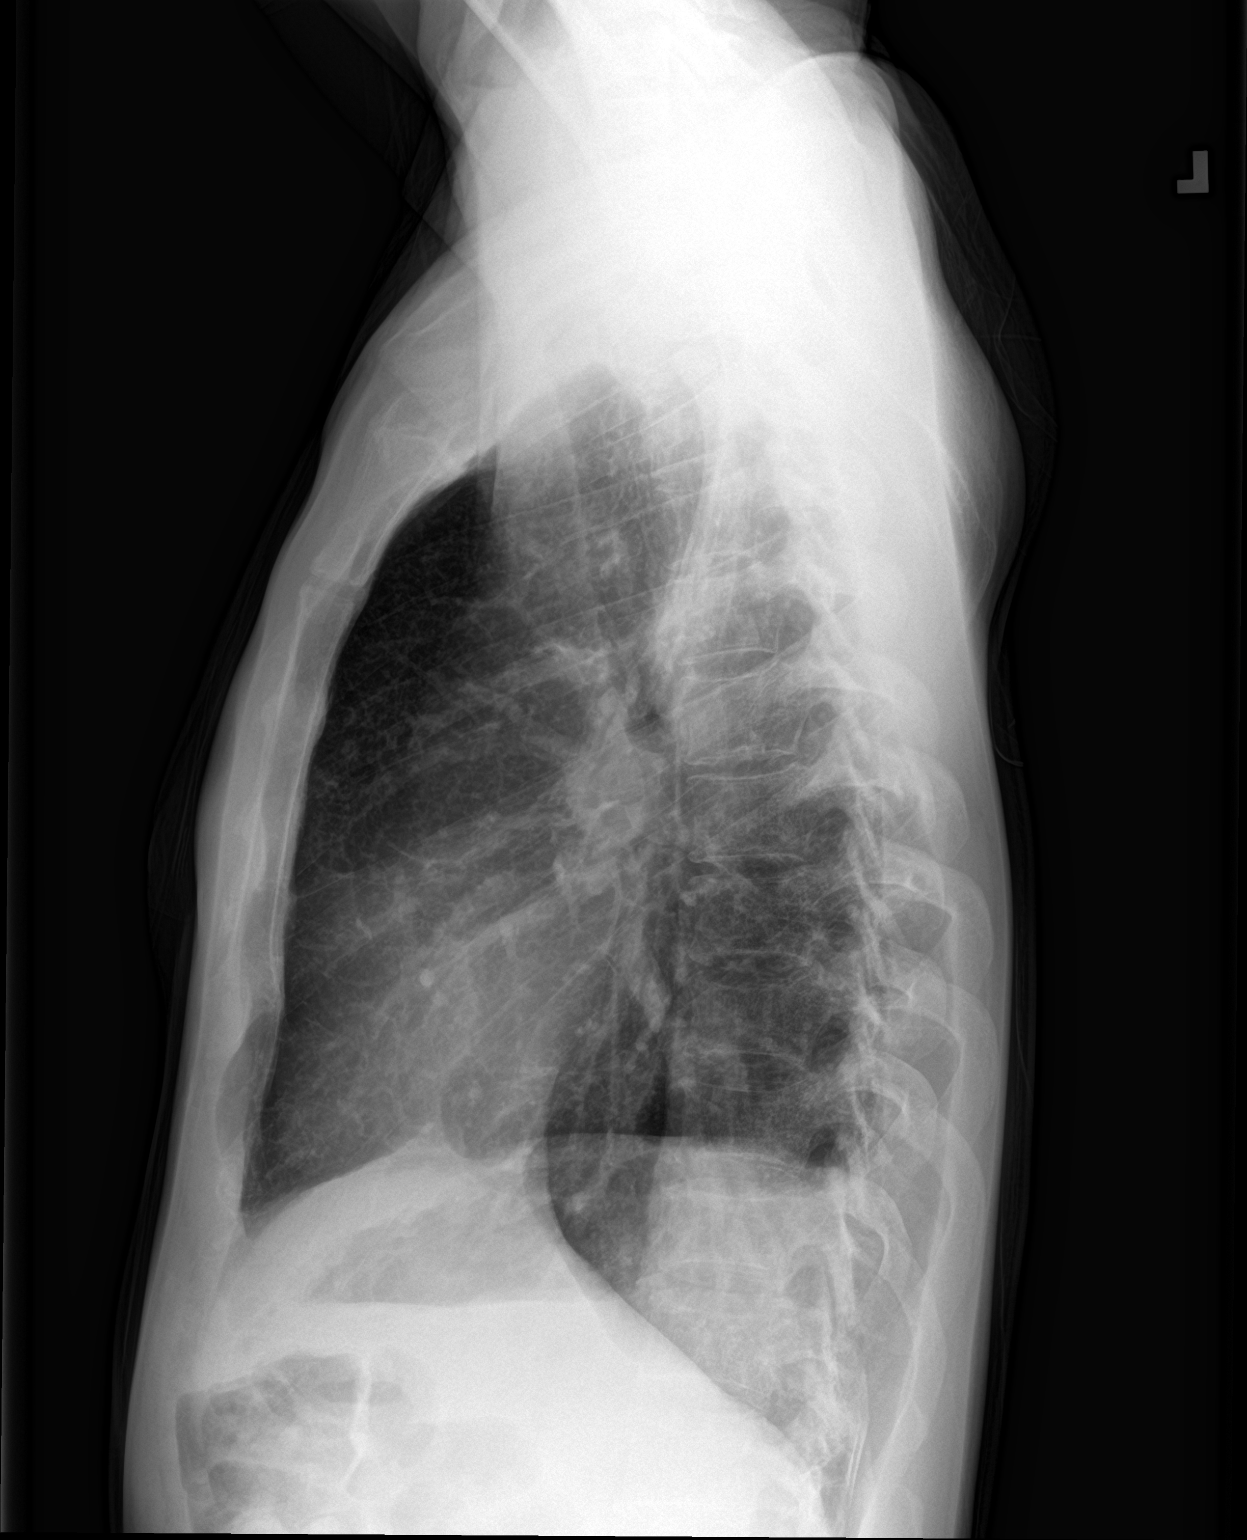

[2 of 2 positions shown; findings below may reference images not displayed]

FINDINGS: There is asymmetric pleural thickening in the left upper lobe with
scattered areas of nodularity in the left upper lobe. Milder apical
pleural thickening is noted on the right. There are scattered
calcified granulomas. No edema or airspace opacity. There is
scarring in the left base which is stable. No edema or airspace
opacity. Heart size and pulmonary vascularity are normal. No
adenopathy. No bone lesions.
IMPRESSION: Apical pleural thickening bilaterally, more severe on the left than
on the right. Stable scarring left base. Areas of stable nodularity
in granulomatous disease remain. No new opacity evident. Cardiac
silhouette within normal limits.

## 2020-11-01 MED ORDER — FLUTICASONE-SALMETEROL 100-50 MCG/ACT IN AEPB: 1.0000 | INHALATION_SPRAY | Freq: Two times a day (BID) | RESPIRATORY_TRACT | 3 refills | Status: DC

## 2020-11-01 NOTE — Telephone Encounter (Signed)
Using interpreter services attempted to reach patient.  Advair rx sent to pharmacy and anoro discontinued.

## 2020-11-13 ENCOUNTER — Ambulatory Visit (INDEPENDENT_AMBULATORY_CARE_PROVIDER_SITE_OTHER): Payer: 59 | Admitting: Family

## 2020-11-13 ENCOUNTER — Encounter: Payer: Self-pay | Admitting: Family

## 2020-11-13 ENCOUNTER — Other Ambulatory Visit: Payer: Self-pay

## 2020-11-13 VITALS — BP 101/52 | HR 64 | Temp 97.7°F | Wt 118.0 lb

## 2020-11-13 DIAGNOSIS — B181 Chronic viral hepatitis B without delta-agent: Secondary | ICD-10-CM | POA: Diagnosis not present

## 2020-11-13 DIAGNOSIS — B1910 Unspecified viral hepatitis B without hepatic coma: Secondary | ICD-10-CM | POA: Diagnosis not present

## 2020-11-13 DIAGNOSIS — K7460 Unspecified cirrhosis of liver: Secondary | ICD-10-CM | POA: Diagnosis not present

## 2020-11-13 MED ORDER — TENOFOVIR DISOPROXIL FUMARATE 300 MG PO TABS: 300.0000 mg | ORAL_TABLET | Freq: Every day | ORAL | 11 refills | Status: DC

## 2020-11-13 NOTE — Assessment & Plan Note (Signed)
Jose Snow had cirrhosis with no focal lesions on most recent imaging. Following with [name] in February for next follow up with best Meld score in the last couple of years at 43.

## 2020-11-13 NOTE — Assessment & Plan Note (Addendum)
Jose Snow continues to do well with good adherence and tolerance to his tenofovir. Has no problems obtaining it from the pharmacy. Reviewed previous lab work and discussed plan of care. Check blood work today. Will continue with current dose of tenofovir indefinitely. Plan for follow up in 9 months or sooner if needed.

## 2020-11-13 NOTE — Patient Instructions (Signed)
Nice to see you.   We will check your lab work today.  Continue to take the tenofovir daily as prescribed.   Refills are available at the pharmacy.  Plan for follow up in 9 months or sooner if needed.   Have a great day and stay safe!

## 2020-11-13 NOTE — Progress Notes (Signed)
Subjective:    Patient ID: Jose Snow, male    DOB: January 10, 1971, 50 y.o.   MRN: [id number]  Chief Complaint  Patient presents with   Follow-up    HEP B     HPI:  Jose Snow is a 50 y.o. 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 Lab work showed a Hepatitis B DN level of <10 with normal AST and ALT. Seen by Gastroenterology with imaging showing chronic hepatic morphology without any focal lesions identified. Meld score was the lowest in 2-3 years at 85. Here today for routine follow up. Jose Snow preferred language is Mont[place] and a medical interpreter is present to aid in translation.  Jose Snow has been taking his tenofovir daily with no adverse side effects or missed doses. Has had a decrease in appetite. Denies any abdominal pain, nausea, vomiting, scleral icterus or jaundice. No current alcohol intake and occasional Tylenol use as needed. Has no problems obtaining medication from the pharmacy.    Allergies  Allergen Reactions   Other     Yellow cough medicine makes him dizzy      Outpatient Medications Prior to Visit  Medication Sig Dispense Refill   B-D UF III MINI PEN NEEDLES 31G X 5 MM MISC daily. use as directed     fluticasone-salmeterol (ADVAIR DISKUS) 100-50 MCG/ACT AEPB Inhale 1 puff into the lungs 2 (two) times daily. 60 each 3   insulin glargine (LANTUS) 100 UNIT/ML injection Inject 6 Units into the skin daily.      Lancets (ONETOUCH DELICA PLUS [id number]) MISC Apply topically.     metFORMIN (GLUCOPHAGE) 500 MG tablet Take 500 mg by mouth daily.     nadolol (CORGARD) 20 MG tablet TAKE 1 TABLET(20 MG) BY MOUTH DAILY 30 tablet 11   omeprazole (PRILOSEC) 40 MG capsule TAKE 1 CAPSULE(40 MG) BY MOUTH DAILY BEFORE AND BREAKFAST 90 capsule 1   ondansetron (ZOFRAN ODT) 4 MG disintegrating tablet Take 1 tablet (4 mg total) by mouth every 8 (eight) hours as needed for  nausea or vomiting. 20 tablet 0   spironolactone (ALDACTONE) 100 MG tablet Take 1 tablet (100 mg total) by mouth daily. 90 tablet 3   umeclidinium-vilanterol (ANORO ELLIPTA) 62.5-25 MCG/INH AEPB Inhale 1 puff into the lungs daily. 1 each 6   umeclidinium-vilanterol (ANORO ELLIPTA) 62.5-25 MCG/INH AEPB Inhale 1 puff into the lungs daily. 60 each 0   tenofovir (VIREAD) 300 MG tablet Take 1 tablet (300 mg total) by mouth daily. 30 tablet 11   albuterol (VENTOLIN HFA) 108 (90 Base) MCG/ACT inhaler Inhale 1-2 puffs into the lungs every 4 (four) hours as needed for wheezing or shortness of breath. 1 each 6   No facility-administered medications prior to visit.     Past Medical History:  Diagnosis Date   Cirrhosis ([place])    Diabetes mellitus ([place])    Hepatitis B    Liver cirrhosis secondary to NASH ([place])    Lymphocytic colitis    Pancytopenia ([place])    TB (tuberculosis)    treated 15 months at health dept.      Past Surgical History:  Procedure Laterality Date   BIOPSY  10/23/2017   Procedure: BIOPSY;  Surgeon: Milus Banister, MD;  [facility]: [facility];  Service: Endoscopy;;   BIOPSY  03/03/2018   Procedure: BIOPSY;  Surgeon: Irving Copas., MD;  Location: [place];  Service: Gastroenterology;;   COLONOSCOPY  WITH PROPOFOL N/A 10/23/2017   Procedure: COLONOSCOPY WITH PROPOFOL;  Surgeon: Milus Banister, MD;  [facility]: [facility];  Service: Endoscopy;  Laterality: N/A;   COMPLEX WOUND CLOSURE Right 12/15/2015   Procedure: COMPLEX WOUND CLOSURE;  Surgeon: Leanora Cover, MD;  Location: [place];  Service: Orthopedics;  Laterality: Right;   ESOPHAGOGASTRODUODENOSCOPY (EGD) WITH PROPOFOL N/A 10/23/2017   Procedure: ESOPHAGOGASTRODUODENOSCOPY (EGD) WITH PROPOFOL;  Surgeon: Milus Banister, MD;  [facility];  Service: Endoscopy;  Laterality: N/A;   ESOPHAGOGASTRODUODENOSCOPY (EGD) WITH PROPOFOL N/A 03/03/2018   Procedure: ESOPHAGOGASTRODUODENOSCOPY (EGD) WITH PROPOFOL;   Surgeon: Rush Landmark Telford Nab., MD;  Location: [place];  Service: Gastroenterology;  Laterality: N/A;   I & D EXTREMITY Right 12/15/2015   Procedure: IRRIGATION AND DEBRIDEMENT AND REVISION AMPUTATION RIGHT RING FINGER;  Surgeon: Leanora Cover, MD;  Location: [place];  Service: Orthopedics;  Laterality: Right;    Review of Systems  Constitutional:  Positive for appetite change. Negative for chills and fever.  Gastrointestinal:  Negative for abdominal pain, diarrhea, nausea and vomiting.  Neurological:  Negative for weakness and headaches.  Hematological:  Does not bruise/bleed easily.     Objective:    BP (!) 101/52   Pulse 64   Temp 97.7 F (36.5 C) (Oral)   Wt 118 lb (53.5 kg)   SpO2 98%   BMI 21.24 kg/m  Nursing note and vital signs reviewed.  Physical Exam Constitutional:      General: He is not in acute distress.    Appearance: He is well-developed.  Cardiovascular:     Rate and Rhythm: Normal rate and regular rhythm.     Heart sounds: Normal heart sounds. No murmur heard.   No friction rub. No gallop.  Pulmonary:     Effort: Pulmonary effort is normal. No respiratory distress.     Breath sounds: Normal breath sounds. No wheezing or rales.  Chest:     Chest wall: No tenderness.  Abdominal:     General: Bowel sounds are normal. There is no distension.     Palpations: Abdomen is soft. There is no mass.     Tenderness: There is no abdominal tenderness. There is no guarding or rebound.  Skin:    General: Skin is warm and dry.  Neurological:     Mental Status: He is alert and oriented to person, place, and time.  Psychiatric:        Behavior: Behavior normal.        Thought Content: Thought content normal.        Judgment: Judgment normal.     Depression screen [facility] 2/9 11/13/2020 05/05/2020 12/22/2019 10/29/2019 04/12/2019  Decreased Interest 0 0 0 0 0  Down, Depressed, Hopeless 0 0 0 0 0  PHQ - 2 Score 0 0 0 0 0       Assessment & Plan:    Patient Active Problem  List   Diagnosis Date Noted   Hepatitis B 02/17/2015    Priority: High   Cirrhosis of liver due to hepatitis B ([place]) 11/25/2014    Priority: High   Chronic cough 12/08/2019   Pancytopenia ([place]) 12/03/2017   Abnormal echocardiogram 12/03/2017   New onset type 2 diabetes mellitus ([place]) 12/03/2017   History of hepatitis B 12/03/2017   Esophageal varices without bleeding (H[facility])    Heart murmur 08/01/2017   Thrombocytopenia ([place]) 11/25/2014   History of TB (tuberculosis) 07/16/2010     Problem List Items Addressed This Visit  Digestive   Cirrhosis of liver due to hepatitis B [facility])    Jose Snow had cirrhosis with no focal lesions on most recent imaging. Following with [name] in February for next follow up with best Meld score in the last couple of years at 59.        Relevant Medications   tenofovir (VIREAD) 300 MG tablet   Other Relevant Orders   Hepatitis B surface antibody,qualitative   Hepatitis B surface antigen   Hepatitis B e antigen   Hepatitis B e antibody   Hepatitis B DNA, ultraquantitative, PCR   CBC   Hepatic function panel   Protime-INR   COMPLETE METABOLIC PANEL WITH GFR   Hepatitis B - Snow    Jose Snow continues to do well with good adherence and tolerance to his tenofovir. Has no problems obtaining it from the pharmacy. Reviewed previous lab work and discussed plan of care. Check blood work today. Will continue with current dose of tenofovir indefinitely. Plan for follow up in 9 months or sooner if needed.       Relevant Medications   tenofovir (VIREAD) 300 MG tablet   Other Relevant Orders   Hepatitis B surface antibody,qualitative   Hepatitis B surface antigen   Hepatitis B e antigen   Hepatitis B e antibody   Hepatitis B DNA, ultraquantitative, PCR   CBC   Hepatic function panel   Protime-INR   COMPLETE METABOLIC PANEL WITH GFR     I am having Jose Snow maintain his insulin glargine, metFORMIN, B-D UF III MINI PEN NEEDLES,  ondansetron, spironolactone, nadolol, Anoro Ellipta, albuterol, Anoro Ellipta, omeprazole, fluticasone-salmeterol, OneTouch Delica Plus [id number], and tenofovir.   Meds ordered this encounter  Medications   tenofovir (VIREAD) 300 MG tablet    Sig: Take 1 tablet (300 mg total) by mouth daily.    Dispense:  30 tablet    Refill:  11    Order Specific Question:   Supervising Provider    Answer:   Carlyle Basques [[id number]]     Follow-up: Return in about 9 months (around 08/13/2021), or if symptoms worsen or fail to improve.   Terri Piedra, MSN, FNP-C Nurse Practitioner [facility] [place] number: [phone]

## 2020-11-15 LAB — COMPLETE METABOLIC PANEL WITH GFR
AG Ratio: 1.4 (calc) (ref 1.0–2.5)
ALT: 31 U/L (ref 9–46)
AST: 37 U/L — ABNORMAL HIGH (ref 10–35)
Albumin: 4.1 g/dL (ref 3.6–5.1)
Alkaline phosphatase (APISO): 52 U/L (ref 35–144)
BUN: 14 mg/dL (ref 7–25)
CO2: 29 mmol/L (ref 20–32)
Calcium: 9.2 mg/dL (ref 8.6–10.3)
Chloride: 103 mmol/L (ref 98–110)
Creat: 1.19 mg/dL (ref 0.70–1.30)
Globulin: 3 g/dL (calc) (ref 1.9–3.7)
Glucose, Bld: 152 mg/dL — ABNORMAL HIGH (ref 65–99)
Potassium: 4.5 mmol/L (ref 3.5–5.3)
Sodium: 138 mmol/L (ref 135–146)
Total Bilirubin: 1.5 mg/dL — ABNORMAL HIGH (ref 0.2–1.2)
Total Protein: 7.1 g/dL (ref 6.1–8.1)
eGFR: 73 mL/min/{1.73_m2} (ref >=60)

## 2020-11-15 LAB — HEPATIC FUNCTION PANEL
AG Ratio: 1.4 (calc) (ref 1.0–2.5)
ALT: 31 U/L (ref 9–46)
AST: 37 U/L — ABNORMAL HIGH (ref 10–35)
Albumin: 4.1 g/dL (ref 3.6–5.1)
Alkaline phosphatase (APISO): 52 U/L (ref 35–144)
Bilirubin, Direct: 0.4 mg/dL — ABNORMAL HIGH (ref 0.0–0.2)
Globulin: 3 g/dL (calc) (ref 1.9–3.7)
Indirect Bilirubin: 1.1 mg/dL (calc) (ref 0.2–1.2)
Total Bilirubin: 1.5 mg/dL — ABNORMAL HIGH (ref 0.2–1.2)
Total Protein: 7.1 g/dL (ref 6.1–8.1)

## 2020-11-15 LAB — PROTIME-INR
INR: 1.2 — ABNORMAL HIGH
Prothrombin Time: 12.5 s — ABNORMAL HIGH (ref 9.0–11.5)

## 2020-11-15 LAB — CBC
HCT: 33.1 % — ABNORMAL LOW (ref 38.5–50.0)
Hemoglobin: 11.5 g/dL — ABNORMAL LOW (ref 13.2–17.1)
MCH: 32.7 pg (ref 27.0–33.0)
MCHC: 34.7 g/dL (ref 32.0–36.0)
MCV: 94 fL (ref 80.0–100.0)
MPV: 12.6 fL — ABNORMAL HIGH (ref 7.5–12.5)
Platelets: 32 10*3/uL — ABNORMAL LOW (ref 140–400)
RBC: 3.52 10*6/uL — ABNORMAL LOW (ref 4.20–5.80)
RDW: 14.2 % (ref 11.0–15.0)
WBC: 2.6 10*3/uL — ABNORMAL LOW (ref 3.8–10.8)

## 2020-11-15 LAB — HEPATITIS B DNA, ULTRAQUANTITATIVE, PCR
Hepatitis B DNA (Calc): <1 Log IU/mL
Hepatitis B DNA: <10 IU/mL

## 2020-11-15 LAB — HEPATITIS B E ANTIBODY: Hep B E Ab: NONREACTIVE

## 2020-11-15 LAB — HEPATITIS B E ANTIGEN: Hep B E Ag: NONREACTIVE

## 2020-11-15 LAB — HEPATITIS B SURFACE ANTIGEN: Hepatitis B Surface Ag: REACTIVE — AB

## 2020-11-15 LAB — HEPATITIS B SURFACE ANTIBODY,QUALITATIVE: Hep B S Ab: NONREACTIVE

## 2020-12-13 ENCOUNTER — Telehealth: Payer: Self-pay

## 2020-12-13 NOTE — Telephone Encounter (Signed)
PA initiated on patients tenofovir. Awaiting response.   Key: [id number]  Carlean Purl, RN

## 2021-01-02 ENCOUNTER — Telehealth: Payer: Self-pay

## 2021-01-02 NOTE — Telephone Encounter (Signed)
Message has been sent to the pt via My Chart.  I have sent a message to myself to set up [place] in 6 months.

## 2021-01-02 NOTE — Telephone Encounter (Signed)
-----   Message from Timothy Lasso, RN sent at 12/29/2020  8:53 AM EDT -----  ----- Message ----- From: Timothy Lasso, RN Sent: 12/29/2020  12:00 AM EDT To: Timothy Lasso, RN  The ultrasound looks good. Obviously the cirrhosis is still present but there is no sign of any growth or cancer in the liver.   You will need repeat ultrasound of the liver in 6 months. My office will call you at that time to help coordinate that

## 2021-01-18 ENCOUNTER — Other Ambulatory Visit: Payer: Self-pay | Admitting: Gastroenterology

## 2021-02-20 ENCOUNTER — Other Ambulatory Visit (HOSPITAL_COMMUNITY): Payer: Self-pay

## 2021-02-21 NOTE — Telephone Encounter (Signed)
I spoke with [name] with [facility] and she stated PA is not required for the Tenofovir. She stated patient has already been picking up the medication. Jose Snow

## 2021-03-08 ENCOUNTER — Other Ambulatory Visit: Payer: Self-pay | Admitting: Emergency Medicine

## 2021-03-08 DIAGNOSIS — K219 Gastro-esophageal reflux disease without esophagitis: Secondary | ICD-10-CM

## 2021-04-19 ENCOUNTER — Other Ambulatory Visit: Payer: Self-pay | Admitting: Pulmonary Disease

## 2021-04-25 ENCOUNTER — Encounter (HOSPITAL_COMMUNITY): Payer: Self-pay

## 2021-04-25 ENCOUNTER — Ambulatory Visit (HOSPITAL_COMMUNITY)
Admission: EM | Admit: 2021-04-25 | Discharge: 2021-04-25 | Disposition: A | Discharge status: Home / Self Care | Payer: Managed Care, Other (non HMO) | Attending: Emergency Medicine | Admitting: Emergency Medicine

## 2021-04-25 ENCOUNTER — Other Ambulatory Visit: Payer: Self-pay

## 2021-04-25 DIAGNOSIS — J441 Chronic obstructive pulmonary disease with (acute) exacerbation: Secondary | ICD-10-CM

## 2021-04-25 MED ORDER — PREDNISONE 10 MG (21) PO TBPK: ORAL_TABLET | Freq: Every day | ORAL | 0 refills | Status: DC

## 2021-04-25 MED ORDER — AZITHROMYCIN 250 MG PO TABS: 250.0000 mg | ORAL_TABLET | Freq: Every day | ORAL | 0 refills | Status: DC

## 2021-04-25 NOTE — ED Triage Notes (Signed)
Patient presents to Urgent Care with complaints of cough since last night. Treating cough with theraflu.   Denies fever.

## 2021-04-25 NOTE — Discharge Instructions (Signed)
Use your albuterol inhaler every 4 to 6 hours if needed for cough and shortness of breath.

## 2021-04-25 NOTE — ED Provider Notes (Signed)
[place]    CSN: [id number] Arrival date & time: 04/25/21  9622      History   Chief Complaint Chief Complaint  Patient presents with   Cough    HPI Jose Snow is a 51 y.o. male. Pt reports 1 days of productive cough with yellow sputum, SOB, and wheezing. Is using his albuterol inhaler for some temp relief, did not use this morning. Denies fever or chills or nasal congestion.    Cough Associated symptoms: shortness of breath and wheezing   Associated symptoms: no chills, no ear pain, no fever and no sore throat    Past Medical History:  Diagnosis Date   Cirrhosis ([place])    Diabetes mellitus ([place])    Hepatitis B    Liver cirrhosis secondary to NASH ([place])    Lymphocytic colitis    Pancytopenia ([place])    TB (tuberculosis)    treated 15 months at health dept.     Patient Active Problem List   Diagnosis Date Noted   Chronic cough 12/08/2019   Pancytopenia ([place]) 12/03/2017   Abnormal echocardiogram 12/03/2017   New onset type 2 diabetes mellitus ([place]) 12/03/2017   History of hepatitis B 12/03/2017   Esophageal varices without bleeding ([place])    Heart murmur 08/01/2017   Hepatitis B 02/17/2015   Cirrhosis of liver due to hepatitis B ([place]) 11/25/2014   Thrombocytopenia ([place]) 11/25/2014   History of TB (tuberculosis) 07/16/2010    Past Surgical History:  Procedure Laterality Date   BIOPSY  10/23/2017   Procedure: BIOPSY;  Surgeon: Milus Banister, MD;  [facility];  Service: Endoscopy;;   BIOPSY  03/03/2018   Procedure: BIOPSY;  Surgeon: Irving Copas., MD;  Location: [facility];  Service: Gastroenterology;;   COLONOSCOPY WITH PROPOFOL N/A 10/23/2017   Procedure: COLONOSCOPY WITH PROPOFOL;  Surgeon: Milus Banister, MD;  [facility];  Service: Endoscopy;  Laterality: N/A;   COMPLEX WOUND CLOSURE Right 12/15/2015   Procedure: COMPLEX WOUND CLOSURE;  Surgeon: Leanora Cover, MD;  Location: [place];  Service: Orthopedics;   Laterality: Right;   ESOPHAGOGASTRODUODENOSCOPY (EGD) WITH PROPOFOL N/A 10/23/2017   Procedure: ESOPHAGOGASTRODUODENOSCOPY (EGD) WITH PROPOFOL;  Surgeon: Milus Banister, MD;  [facility];  Service: Endoscopy;  Laterality: N/A;   ESOPHAGOGASTRODUODENOSCOPY (EGD) WITH PROPOFOL N/A 03/03/2018   Procedure: ESOPHAGOGASTRODUODENOSCOPY (EGD) WITH PROPOFOL;  Surgeon: Rush Landmark Telford Nab., MD;  Location: [place];  Service: Gastroenterology;  Laterality: N/A;   I & D EXTREMITY Right 12/15/2015   Procedure: IRRIGATION AND DEBRIDEMENT AND REVISION AMPUTATION RIGHT RING FINGER;  Surgeon: Leanora Cover, MD;  Location: [place];  Service: Orthopedics;  Laterality: Right;       Home Medications    Prior to Admission medications   Medication Sig Start Date End Date Taking? Authorizing Provider  azithromycin (ZITHROMAX) 250 MG tablet Take 1 tablet (250 mg total) by mouth daily. Take first 2 tablets together, then 1 every day until finished. 04/25/21  Yes Carvel Getting, NP  predniSONE (STERAPRED UNI-PAK 21 TAB) 10 MG (21) TBPK tablet Take by mouth daily. Take 6 tabs by mouth daily  for 2 days, then 5 tabs for 2 days, then 4 tabs for 2 days, then 3 tabs for 2 days, 2 tabs for 2 days, then 1 tab by mouth daily for 2 days 04/25/21  Yes Reannon Candella, Dionne Bucy, NP  albuterol (VENTOLIN HFA) 108 (90 Base) MCG/ACT inhaler Inhale 1-2 puffs into the lungs every 4 (four) hours as  needed for wheezing or shortness of breath. 02/04/20 03/05/20  Margaretha Seeds, MD  B-D UF III MINI PEN NEEDLES 31G X 5 MM MISC daily. use as directed 06/21/19   [provider]  fluticasone-salmeterol (ADVAIR DISKUS) 100-50 MCG/ACT AEPB Inhale 1 puff into the lungs 2 (two) times daily. 11/01/20   Margaretha Seeds, MD  insulin glargine (LANTUS) 100 UNIT/ML injection Inject 6 Units into the skin daily.     [provider]  Lancets (ONETOUCH DELICA PLUS [id number]) [place] Apply topically. 09/06/20   [provider]   metFORMIN (GLUCOPHAGE) 500 MG tablet Take 500 mg by mouth daily. 05/07/19   [provider]  nadolol (CORGARD) 20 MG tablet TAKE 1 TABLET(20 MG) BY MOUTH DAILY 01/18/21   Milus Banister, MD  omeprazole (PRILOSEC) 40 MG capsule TAKE 1 CAPSULE(40 MG) BY MOUTH DAILY BEFORE BREAKFAST 03/08/21   Horald Pollen, MD  ondansetron (ZOFRAN ODT) 4 MG disintegrating tablet Take 1 tablet (4 mg total) by mouth every 8 (eight) hours as needed for nausea or vomiting. 07/20/19   Wieters, Hallie C, PA-C  spironolactone (ALDACTONE) 100 MG tablet Take 1 tablet (100 mg total) by mouth daily. 11/22/19   Milus Banister, MD  tenofovir (VIREAD) 300 MG tablet Take 1 tablet (300 mg total) by mouth daily. 11/13/20   [place] Circle, FNP  umeclidinium-vilanterol (ANORO ELLIPTA) 62.5-25 MCG/INH AEPB Inhale 1 puff into the lungs daily. 02/04/20   Margaretha Seeds, MD  umeclidinium-vilanterol [facility] ELLIPTA) 62.5-25 MCG/INH AEPB Inhale 1 puff into the lungs daily. 02/04/20   Margaretha Seeds, MD    Family History Family History  Problem Relation Age of Onset   Colon cancer Neg Hx    Esophageal cancer Neg Hx    Pancreatic cancer Neg Hx    Stomach cancer Neg Hx    Liver disease Neg Hx     Social History Social History   Tobacco Use   Smoking status: Former    Packs/day: 0.50    Years: 8.00    Pack years: 4.00    Types: Cigarettes    Quit date: 04/08/1990    Years since quitting: 31.0   Smokeless tobacco: Never  Vaping Use   Vaping Use: Never used  Substance Use Topics   Alcohol use: No   Drug use: No     Allergies   Other   Review of Systems Review of Systems  Constitutional:  Negative for chills and fever.  HENT:  Negative for congestion, ear pain and sore throat.   Respiratory:  Positive for cough, shortness of breath and wheezing.     Physical Exam Triage Vital Signs ED Triage Vitals  Enc Vitals Group     BP 04/25/21 1124 (!) 113/56     Pulse Rate 04/25/21 1124 85     Resp  04/25/21 1124 16     Temp 04/25/21 1124 99.9 F (37.7 C)     Temp Source 04/25/21 1124 Oral     SpO2 04/25/21 1124 96 %     Weight --      Height --      Head Circumference --      Peak Flow --      Pain Score 04/25/21 1123 0     Pain Loc --      Pain Edu? --      Excl. in [place]? --    No data found.  Updated Vital Signs BP (!) 113/56 (BP Location: Right  Arm)    Pulse 85    Temp 99.9 F (37.7 C) (Oral)    Resp 16    SpO2 96%   Visual Acuity Right Eye Distance:   Left Eye Distance:   Bilateral Distance:    Right Eye Near:   Left Eye Near:    Bilateral Near:     Physical Exam Constitutional:      General: He is not in acute distress.    Appearance: Normal appearance. He is not ill-appearing.  HENT:     Right Ear: Ear canal and external ear normal. Tympanic membrane is perforated.     Left Ear: Ear canal and external ear normal. Tympanic membrane is perforated.     Nose: Nose normal.     Mouth/Throat:     Mouth: Mucous membranes are moist.     Pharynx: Oropharynx is clear.  Cardiovascular:     Rate and Rhythm: Normal rate and regular rhythm.  Pulmonary:     Effort: Pulmonary effort is normal.     Breath sounds: Normal breath sounds.     Comments: Deep, barking cough occasionally heard Neurological:     Mental Status: He is alert.     UC Treatments / Results  Labs (all labs ordered are listed, but only abnormal results are displayed) Labs Reviewed - No data to display  EKG   Radiology No results found.  Procedures Procedures (including critical care time)  Medications Ordered in UC Medications - No data to display  Initial Impression / Assessment and Plan / UC Course  I have reviewed the triage vital signs and the nursing notes.  Pertinent labs & imaging results that were available during my care of the patient were reviewed by me and considered in my medical decision making (see chart for details).    Pt with low grade fever, hx copd, productive  cough with yellow sputum. Rx zithromax, prednisone. Pt to increase use of albuterol. Given note for work.   Final Clinical Impressions(s) / UC Diagnoses   Final diagnoses:  COPD exacerbation ([place])     Discharge Instructions      Use your albuterol inhaler every 4 to 6 hours if needed for cough and shortness of breath.    ED Prescriptions     Medication Sig Dispense Auth. Provider   predniSONE (STERAPRED UNI-PAK 21 TAB) 10 MG (21) TBPK tablet Take by mouth daily. Take 6 tabs by mouth daily  for 2 days, then 5 tabs for 2 days, then 4 tabs for 2 days, then 3 tabs for 2 days, 2 tabs for 2 days, then 1 tab by mouth daily for 2 days 42 tablet Marcellous Snarski, Dionne Bucy, NP   azithromycin (ZITHROMAX) 250 MG tablet Take 1 tablet (250 mg total) by mouth daily. Take first 2 tablets together, then 1 every day until finished. 6 tablet Carvel Getting, NP      PDMP not reviewed this encounter.   Carvel Getting, NP 04/25/21 1213

## 2021-05-04 ENCOUNTER — Ambulatory Visit (HOSPITAL_COMMUNITY)
Admission: EM | Admit: 2021-05-04 | Discharge: 2021-05-04 | Disposition: A | Discharge status: Home / Self Care | Payer: Managed Care, Other (non HMO) | Attending: Physician Assistant | Admitting: Physician Assistant

## 2021-05-04 ENCOUNTER — Other Ambulatory Visit: Payer: Self-pay

## 2021-05-04 ENCOUNTER — Encounter (HOSPITAL_COMMUNITY): Payer: Self-pay | Admitting: *Deleted

## 2021-05-04 ENCOUNTER — Ambulatory Visit (INDEPENDENT_AMBULATORY_CARE_PROVIDER_SITE_OTHER): Payer: Managed Care, Other (non HMO)

## 2021-05-04 DIAGNOSIS — R059 Cough, unspecified: Secondary | ICD-10-CM

## 2021-05-04 DIAGNOSIS — J209 Acute bronchitis, unspecified: Secondary | ICD-10-CM | POA: Diagnosis not present

## 2021-05-04 MED ORDER — METHYLPREDNISOLONE SODIUM SUCC 125 MG IJ SOLR
INTRAMUSCULAR | Status: AC
  Filled 2021-05-04: qty 2

## 2021-05-04 MED ORDER — METHYLPREDNISOLONE SODIUM SUCC 125 MG IJ SOLR
125.0000 mg | Freq: Once | INTRAMUSCULAR | Status: AC
  Administered 2021-05-04: 125 mg via INTRAMUSCULAR

## 2021-05-04 MED ORDER — METHYLPREDNISOLONE SODIUM SUCC 125 MG IJ SOLR: 125.0000 mg | Freq: Once | INTRAMUSCULAR | 0 refills | Status: DC

## 2021-05-04 NOTE — ED Triage Notes (Signed)
Pt has on going cough. Cough is worse at night.

## 2021-05-04 NOTE — ED Provider Notes (Signed)
[place]    CSN: [id number] Arrival date & time: 05/04/21  1523      History   Chief Complaint Chief Complaint  Patient presents with   Cough    HPI Jose Snow is a 51 y.o. male.   The history is provided by the patient. No language interpreter was used.  Cough Cough characteristics:  Non-productive Sputum characteristics:  Nondescript Severity:  Moderate Duration:  4 weeks Timing:  Constant Progression:  Worsening Chronicity:  Recurrent Relieved by:  Nothing Worsened by:  Nothing Ineffective treatments:  None tried Associated symptoms: shortness of breath    Past Medical History:  Diagnosis Date   Cirrhosis ([place])    Diabetes mellitus ([place])    Hepatitis B    Liver cirrhosis secondary to NASH ([place])    Lymphocytic colitis    Pancytopenia ([place])    TB (tuberculosis)    treated 15 months at health dept.     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[place]) 11/25/2014   Thrombocytopenia ([place]) 11/25/2014   History of TB (tuberculosis) 07/16/2010    Past Surgical History:  Procedure Laterality Date   BIOPSY  10/23/2017   Procedure: BIOPSY;  Surgeon: Milus Banister, MD;  [facility]: [facility] ENDOSCOPY;  Service: Endoscopy;;   BIOPSY  03/03/2018   Procedure: BIOPSY;  Surgeon: Irving Copas., MD;  Location: [facility] ENDOSCOPY;  Service: Gastroenterology;;   COLONOSCOPY WITH PROPOFOL N/A 10/23/2017   Procedure: COLONOSCOPY WITH PROPOFOL;  Surgeon: Milus Banister, MD;  [facility];  Service: Endoscopy;  Laterality: N/A;   COMPLEX WOUND CLOSURE Right 12/15/2015   Procedure: COMPLEX WOUND CLOSURE;  Surgeon: Leanora Cover, MD;  Location: [place];  Service: Orthopedics;  Laterality: Right;   ESOPHAGOGASTRODUODENOSCOPY (EGD) WITH PROPOFOL N/A 10/23/2017   Procedure: ESOPHAGOGASTRODUODENOSCOPY (EGD) WITH PROPOFOL;  Surgeon: Milus Banister, MD;  [facility];  Service: Endoscopy;  Laterality: N/A;   ESOPHAGOGASTRODUODENOSCOPY (EGD) WITH PROPOFOL N/A 03/03/2018   Procedure: ESOPHAGOGASTRODUODENOSCOPY (EGD) WITH PROPOFOL;  Surgeon: Rush Landmark Telford Nab., MD;  Location: [place];  Service: Gastroenterology;  Laterality: N/A;   I & D EXTREMITY Right 12/15/2015   Procedure: IRRIGATION AND DEBRIDEMENT AND REVISION AMPUTATION RIGHT RING FINGER;  Surgeon: Leanora Cover, MD;  Location: [place];  Service: Orthopedics;  Laterality: Right;       Home Medications    Prior to Admission medications   Medication Sig Start Date End Date Taking? Authorizing Provider  methylPREDNISolone sodium succinate (SOLU-MEDROL) 125 mg/2 mL injection Inject 2 mLs (125 mg total) into the muscle once for 1 dose. 05/04/21 05/04/21 Yes Fransico Meadow, PA-C  albuterol (VENTOLIN HFA) 108 (90 Base) MCG/ACT inhaler Inhale 1-2 puffs into the lungs every 4 (four) hours as needed for wheezing or shortness of breath. 02/04/20 03/05/20  Margaretha Seeds, MD  azithromycin (ZITHROMAX) 250 MG tablet Take 1 tablet (250 mg total) by mouth daily. Take first 2 tablets together, then 1 every day until finished. 04/25/21   Carvel Getting, NP  B-D UF III MINI PEN NEEDLES 31G X 5 MM MISC daily. use as directed 06/21/19   [provider]  fluticasone-salmeterol (ADVAIR DISKUS) 100-50 MCG/ACT AEPB Inhale 1 puff into the lungs 2 (two) times daily. 11/01/20   Margaretha Seeds, MD  insulin glargine (LANTUS) 100 UNIT/ML injection Inject 6 Units into the skin daily.     [provider]  Lancets (ONETOUCH DELICA PLUS [id number]) [place] Apply topically. 09/06/20   [provider]  metFORMIN (GLUCOPHAGE) 500 MG tablet Take 500 mg by mouth  daily. 05/07/19   [provider]  nadolol (CORGARD) 20 MG tablet TAKE 1 TABLET(20 MG) BY MOUTH DAILY 01/18/21   Milus Banister, MD  omeprazole (PRILOSEC) 40 MG capsule TAKE 1 CAPSULE(40 MG) BY MOUTH DAILY BEFORE BREAKFAST 03/08/21   Horald Pollen, MD  ondansetron (ZOFRAN ODT) 4 MG disintegrating tablet Take 1 tablet (4 mg total) by mouth every 8 (eight) hours as needed for nausea or vomiting. 07/20/19   Wieters, Hallie C, PA-C  predniSONE (STERAPRED UNI-PAK 21 TAB) 10 MG (21) TBPK tablet Take by mouth daily. Take 6 tabs by mouth daily  for 2 days, then 5 tabs for 2 days, then 4 tabs for 2 days, then 3 tabs for 2 days, 2 tabs for 2 days, then 1 tab by mouth daily for 2 days 04/25/21   Carvel Getting, NP  spironolactone (ALDACTONE) 100 MG tablet Take 1 tablet (100 mg total) by mouth daily. 11/22/19   Milus Banister, MD  tenofovir (VIREAD) 300 MG tablet Take 1 tablet (300 mg total) by mouth daily. 11/13/20   Golden Circle, FNP  umeclidinium-vilanterol (ANORO ELLIPTA) 62.5-25 MCG/INH AEPB Inhale 1 puff into the lungs daily. 02/04/20   Margaretha Seeds, MD  umeclidinium-vilanterol [facility] ELLIPTA) 62.5-25 MCG/INH AEPB Inhale 1 puff into the lungs daily. 02/04/20   Margaretha Seeds, MD    Family History Family History  Problem Relation Age of Onset   Colon cancer Neg Hx    Esophageal cancer Neg Hx    Pancreatic cancer Neg Hx    Stomach cancer Neg Hx    Liver disease Neg Hx     Social History Social History   Tobacco Use   Smoking status: Former    Packs/day: 0.50    Years: 8.00    Pack years: 4.00    Types: Cigarettes    Quit date: 04/08/1990    Years since quitting: 31.0   Smokeless tobacco: Never  Vaping Use   Vaping Use: Never used  Substance Use Topics   Alcohol use: No   Drug use: No     Allergies   Other   Review of Systems Review of Systems  Respiratory:  Positive for cough and shortness of breath.   All other systems reviewed and are  negative.   Physical Exam Triage Vital Signs ED Triage Vitals  Enc Vitals Group     BP 05/04/21 1617 115/64     Pulse Rate 05/04/21 1617 60     Resp --      Temp 05/04/21 1617 98 F (36.7 C)     Temp src --      SpO2 05/04/21 1617 97 %     Weight --      Height --      Head Circumference --      Peak Flow --      Pain Score 05/04/21 1615 0     Pain Loc --      Pain Edu? --      Excl. in [place]? --    No data found.  Updated Vital Signs BP 115/64    Pulse 60    Temp 98 F (36.7 C)    SpO2 97%   Visual Acuity Right Eye Distance:   Left Eye Distance:   Bilateral Distance:    Right Eye Near:   Left Eye Near:    Bilateral Near:     Physical Exam Vitals reviewed.  Constitutional:      Appearance: Normal appearance.  Cardiovascular:     Rate and Rhythm: Normal rate and regular rhythm.     Pulses: Normal pulses.  Pulmonary:     Effort: Pulmonary effort is normal.     Breath sounds: Rhonchi present.  Abdominal:     General: Abdomen is flat.  Musculoskeletal:        General: Normal range of motion.  Skin:    General: Skin is warm.  Neurological:     General: No focal deficit present.     Mental Status: He is alert.  Psychiatric:        Mood and Affect: Mood normal.     UC Treatments / Results  Labs (all labs ordered are listed, but only abnormal results are displayed) Labs Reviewed - No data to display  EKG   Radiology DG Chest 2 View  Result Date: 05/04/2021 CLINICAL DATA:  Cough for 2 weeks.  History of pneumonia. EXAM: CHEST - 2 VIEW COMPARISON:  Radiographs 12/21/2019 and 12/18/2019.  CT 12/10/2018. FINDINGS: The PA view was repeated. The heart size and mediastinal contours are stable. There is stable chronic volume loss in the left hemithorax with associated pleural thickening at the apex and costophrenic angle. There is grossly stable chronic lung disease with scattered parenchymal calcifications and areas of nodular scarring. No superimposed airspace  disease, significant pleural effusion or pneumothorax identified. The bones appear unchanged. IMPRESSION: Grossly stable chronic pleuroparenchymal scarring, likely postinflammatory. No evidence of acute superimposed process. Electronically Signed   By: Richardean Sale M.D.   On: 05/04/2021 16:47    Procedures Procedures (including critical care time)  Medications Ordered in UC Medications - No data to display  Initial Impression / Assessment and Plan / UC Course  I have reviewed the triage vital signs and the nursing notes.  Pertinent labs & imaging results that were available during my care of the patient were reviewed by me and considered in my medical decision making (see chart for details).     MDM:  chest xray  no pneumonia  Final Clinical Impressions(s) / UC Diagnoses   Final diagnoses:  Acute bronchitis, unspecified organism   Discharge Instructions   None    ED Prescriptions     Medication Sig Dispense Auth. Provider   methylPREDNISolone sodium succinate (SOLU-MEDROL) 125 mg/2 mL injection Inject 2 mLs (125 mg total) into the muscle once for 1 dose. 1 each Fransico Meadow, PA-C      PDMP not reviewed this encounter.   Fransico Meadow, [place] 05/04/21 1713

## 2021-05-20 ENCOUNTER — Other Ambulatory Visit: Payer: Self-pay | Admitting: Family

## 2021-05-20 DIAGNOSIS — B1910 Unspecified viral hepatitis B without hepatic coma: Secondary | ICD-10-CM

## 2021-05-20 DIAGNOSIS — B181 Chronic viral hepatitis B without delta-agent: Secondary | ICD-10-CM

## 2021-05-24 ENCOUNTER — Other Ambulatory Visit: Payer: Self-pay | Admitting: Gastroenterology

## 2021-05-30 ENCOUNTER — Ambulatory Visit: Payer: 59 | Admitting: Gastroenterology

## 2021-06-03 ENCOUNTER — Other Ambulatory Visit: Payer: Self-pay | Admitting: Gastroenterology

## 2021-06-19 ENCOUNTER — Ambulatory Visit (INDEPENDENT_AMBULATORY_CARE_PROVIDER_SITE_OTHER): Payer: Managed Care, Other (non HMO) | Admitting: Gastroenterology

## 2021-06-19 ENCOUNTER — Other Ambulatory Visit (INDEPENDENT_AMBULATORY_CARE_PROVIDER_SITE_OTHER): Payer: Managed Care, Other (non HMO)

## 2021-06-19 ENCOUNTER — Encounter: Payer: Self-pay | Admitting: Gastroenterology

## 2021-06-19 VITALS — BP 118/74 | HR 62 | Ht 63.0 in | Wt 119.2 lb

## 2021-06-19 DIAGNOSIS — K7460 Unspecified cirrhosis of liver: Secondary | ICD-10-CM

## 2021-06-19 DIAGNOSIS — B181 Chronic viral hepatitis B without delta-agent: Secondary | ICD-10-CM

## 2021-06-19 LAB — COMPREHENSIVE METABOLIC PANEL
ALT: 23 U/L (ref 0–53)
AST: 35 U/L (ref 0–37)
Albumin: 3.9 g/dL (ref 3.5–5.2)
Alkaline Phosphatase: 65 U/L (ref 39–117)
BUN: 14 mg/dL (ref 6–23)
CO2: 28 mEq/L (ref 19–32)
Calcium: 8.4 mg/dL (ref 8.4–10.5)
Chloride: 106 mEq/L (ref 96–112)
Creatinine, Ser: 0.88 mg/dL (ref 0.40–1.50)
GFR: 98.39 mL/min (ref >60.00)
Glucose, Bld: 127 mg/dL — ABNORMAL HIGH (ref 70–99)
Potassium: 3.8 mEq/L (ref 3.5–5.1)
Sodium: 139 mEq/L (ref 135–145)
Total Bilirubin: 1.4 mg/dL — ABNORMAL HIGH (ref 0.2–1.2)
Total Protein: 6.2 g/dL (ref 6.0–8.3)

## 2021-06-19 LAB — CBC
HCT: 32.4 % — ABNORMAL LOW (ref 39.0–52.0)
Hemoglobin: 11.4 g/dL — ABNORMAL LOW (ref 13.0–17.0)
MCHC: 35 g/dL (ref 30.0–36.0)
MCV: 92.6 fl (ref 78.0–100.0)
Platelets: 23 10*3/uL — CL (ref 150.0–400.0)
RBC: 3.5 Mil/uL — ABNORMAL LOW (ref 4.22–5.81)
RDW: 15.8 % — ABNORMAL HIGH (ref 11.5–15.5)
WBC: 1.3 10*3/uL — CL (ref 4.0–10.5)

## 2021-06-19 LAB — PROTIME-INR
INR: 1.4 ratio — ABNORMAL HIGH (ref 0.8–1.0)
Prothrombin Time: 14.8 s — ABNORMAL HIGH (ref 9.6–13.1)

## 2021-06-19 NOTE — Patient Instructions (Signed)
If you are age 53 or younger, your body mass index should be between 19-25. Your Body mass index is 21.12 kg/m?Marland Kitchen If this is out of the aformentioned range listed, please consider follow up with your Primary Care Provider.  ?________________________________________________________ ? ?The [place] [facility] providers would like to encourage you to use [facility] to communicate with providers for non-urgent requests or questions.  Due to long hold times on the telephone, sending your provider a message by [facility] may be a faster and more efficient way to get a response.  Please allow 48 business hours for a response.  Please remember that this is for non-urgent requests.  ?_______________________________________________________ ? ?Your provider has requested that you go to the basement level for lab work before leaving today. Press "B" on the elevator. The lab is located at the first door on the left as you exit the elevator. ? ?You have been scheduled for an abdominal ultrasound at [facility] Radiology (1st floor of hospital) on 06-26-21 at 9:30am. Please arrive 30 minutes prior to your appointment for registration. Make certain not to have anything to eat or drink after midnight the night prior to your appointment. Should you need to reschedule your appointment, please contact radiology at [phone]. This test typically takes about 30 minutes to perform. ? ?Due to recent changes in healthcare laws, you may see the results of your imaging and laboratory studies on MyChart before your provider has had a chance to review them.  We understand that in some cases there may be results that are confusing or concerning to you. Not all laboratory results come back in the same time frame and the provider may be waiting for multiple results in order to interpret others.  Please give [place] 48 hours in order for your provider to thoroughly review all the results before contacting the office for clarification of your results.  ? ?You will  need to follow up in 1 year (March 2023). We will contact you to schedule this appointment.  ? ?Thank you for entrusting me with your care and choosing [facility]. ? ?Dr Ardis Hughs ? ? ? ?

## 2021-06-19 NOTE — Progress Notes (Signed)
Review of pertinent gastrointestinal problems: ?1. 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 Lurlean Leyden and eventual care by Dr. Elna Breslow ID ?Thrombocytopenia (Plts around 20K), mild ascites, signs of portal HTN on imaging ?Labs 2019: Hepatitis B E antibody reactive, hepatitis B core total antibody reactive, hepatitis B surface antigen reactive, hepatitis B E antigen nonreactive (2 years ago this was positive) hepatitis B surface antibody  Nonreactive, hepatitis B DNA 1,200,000 IUs/mL, HIV negative ?Cirrhosis etiology workup: Labs 2016: Ferritin slightly elevated, HIV negative, hepatitis C virus IgG negative, anti-smooth muscle antibody IgG negative, ANA negative,  HCV Ab negative, ceruloplamin 12.3 (slightly low), A1A normal. ?Current MELD: 8, labs 11/2020 ?EGD 10/2017, [name]; small to medium sized esophageal varices, + portal gastropathy. Started nadolol 20m daily.  EGD November 2019 while inpatient for drop in hemoglobin and abdominal pain found the same as above as well as multiple erosions.  Biopsies were taken and he was H. pylori negative.  He was put on antibiotics for H. pylori given serologic positive testing (pylera).  Heart rate in 50s to low 60s, no room to increase nadolol further ?Liver imaging 08/2017 CT without IV contrast: cirrhosis, splenomegaly, mild ascites, signs of portal HTN.  02/2018 CT scan wo IV contrast cirrhosis with no masses.  MRI January 2020 motion degraded, cirrhosis without obvious hepatoma. Ultrasound March 2021 cirrhosis without focal lesions.  Ultrasound 12/2019 cirrhosis without focal liver lesions. [place]2022 cirrhosis without focal lesions. ?[facility]2017 Dr. ZLurlean Leyden?  ?2. Lymphocytic colitis: colonoscopy 10/2017 Dr. JArdis Hughsfor chronic diarrhea: hemorrhoids, o/w normal. Random biopsies + lymphocytic colitis. Started on budesonide 92mdaily. ? ?HPI: ?This is a very pleasant [age]ear old man who is here with a  professional interpreter ? ?I last saw him 1 year ago ? ?His weight 1 year ago was 116 pounds and today it is 119 pounds, same scale here in our office ? ?He seems to have done quite well in the past year.  No overt GI bleeding, no encephalopathy events, no jaundice, no trouble with edema. ? ?ROS: complete GI ROS as described in HPI, all other review negative. ? ?Constitutional:  No unintentional weight loss ? ? ?Past Medical History:  ?Diagnosis Date  ? Cirrhosis ([place]  ? Diabetes mellitus ([place]  ? Hepatitis B   ? [facility] ([facility]  ? Lymphocytic colitis   ? [place]top[place] ([place]  ? TB (tuberculosis)   ? 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   ? ? ?Past Surgical History:  ?Procedure Laterality Date  ? BIOPSY  10/23/2017  ? Procedure: BIOPSY;  Surgeon: JaMilus BanisterMD;  [facility];  Service: Endoscopy;;  ? BIOPSY  03/03/2018  ? Procedure: BIOPSY;  Surgeon: MaIrving Copas MD;  [place]: Gastroenterology;;  ? COLONOSCOPY WITH PROPOFOL N/A 10/23/2017  ? Procedure: COLONOSCOPY WITH PROPOFOL;  Surgeon: JaMilus BanisterMD;  [facility];  Service: Endoscopy;  Laterality: N/A;  ? COMPLEX WOUND CLOSURE Right 12/15/2015  ? Procedure: COMPLEX WOUND CLOSURE;  Surgeon: KeLeanora CoverMD;  [place] [facility] [facility]: [facility]opedics;  Laterality: Right;  ? ESOPHAGOGASTRODUODENOSCOPY (EGD) WITH PROPOFOL N/A 10/23/2017  ? Procedure: ESOPHAGOGASTRODUODENOSCOPY (EGD) WITH PROPOFOL;  Surgeon: JaMilus BanisterMD;  [facility];  Service: Endoscopy;  Laterality: N/A;  ? ESOPHAGOGASTRODUODENOSCOPY (EGD) WITH PROPOFOL N/A 03/03/2018  ? Procedure: ESOPHAGOGASTRODUODENOSCOPY (EGD) WITH PROPOFOL;  Surgeon: MaRush LandmarkaTelford Nab MD;  [facility];  Service: Gastroenterology;  Laterality: N/A;  ? I & D EXTREMITY Right 12/15/2015  ? Procedure: IRRIGATION AND DEBRIDEMENT AND REVISION AMPUTATION RIGHT RING FINGER;  Surgeon: Leanora Cover, MD;  Location: [place];  Service: Orthopedics;  Laterality: Right;  ? ? ?Current Outpatient Medications  ?Medication Instructions  ? albuterol (VENTOLIN HFA) 108 (90 Base) MCG/ACT inhaler 1-2 puffs, Inhalation, Every 4 hours PRN  ? azithromycin (ZITHROMAX) 250 mg, Oral, Daily, Take first 2 tablets together, then 1 every day until finished.  ? B-D UF III MINI PEN NEEDLES 31G X 5 MM MISC Daily, use as directed  ? fluticasone-salmeterol (ADVAIR DISKUS) 100-50 MCG/ACT AEPB 1 puff, Inhalation, 2 times daily  ? insulin glargine (LANTUS) 6 Units, Subcutaneous, Daily  ? Lancets (ONETOUCH DELICA PLUS [id number]) MISC Topical  ? metFORMIN (GLUCOPHAGE) 500 mg, Oral, Daily  ? nadolol (CORGARD) 20 MG tablet TAKE 1 TABLET(20 MG) BY MOUTH DAILY  ? omeprazole (PRILOSEC) 40 MG capsule TAKE 1 CAPSULE(40 MG) BY MOUTH DAILY BEFORE BREAKFAST  ? ondansetron (ZOFRAN ODT) 4 mg, Oral, Every 8 hours PRN  ? predniSONE (STERAPRED UNI-PAK 21 TAB) 10 MG (21) TBPK tablet Oral, Daily, Take 6 tabs by mouth daily  for 2 days, then 5 tabs for 2 days, then 4 tabs for 2 days, then 3 tabs for 2 days, 2 tabs for 2 days, then 1 tab by mouth daily for 2 days  ? spironolactone (ALDACTONE) 100 mg, Oral, Daily  ? tenofovir (VIREAD) 300 mg, Oral, Daily  ? umeclidinium-vilanterol (ANORO ELLIPTA) 62.5-25 MCG/INH AEPB 1 puff, Inhalation, Daily  ? umeclidinium-vilanterol (ANORO ELLIPTA) 62.5-25 MCG/INH AEPB 1 puff, Inhalation, Daily  ? ? ?Allergies as of 06/19/2021 - Review Complete 06/19/2021  ?Allergen Reaction Noted  ? Other  11/15/2019  ? ? ?Family History  ?Problem Relation Age of Onset  ? Colon cancer Neg Hx   ? Esophageal cancer Neg Hx   ? Pancreatic cancer Neg Hx   ? Stomach cancer Neg Hx   ? Liver disease Neg Hx   ? ? ?Social History  ? ?Socioeconomic History  ? Marital status: Married  ?  Spouse name: Not on file  ? Number of children: 4  ? Years of education: Not on file  ? Highest education level: Not on file  ?Occupational History  ? Occupation: [employer]  ?  Comment:  Builds chair parts  ?Tobacco Use  ? Smoking status: Former  ?  Packs/day: 0.50  ?  Years: 8.00  ?  Pack years: 4.00  ?  Types: Cigarettes  ?  Quit date: 04/08/1990  ?  Years since quitting: 31.2  ? Smokeless tobacco: Never  ?Vaping Use  ? Vaping Use: Never used  ?Substance and Sexual Activity  ? Alcohol use: No  ? Drug use: No  ? Sexual activity: Yes  ?Other Topics Concern  ? Not on file  ?Social History Narrative  ? 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  Lives with wife and four children.   ? ?Social Determinants of Health  ? ?Financial Resource Strain: Not on file  ?Food Insecurity: Not on file  ?Transportation Needs: Not on file  ?Physical Activity: Not on file  ?Stress: Not on file  ?Social Connections: Not on file  ?Intimate Partner Violence: Not on file  ? ? ? ?Physical Exam: ?Ht 5' 3"  (1.6 m)   Wt 119 lb 3.2 oz (54.1 kg)   BMI 21.12 kg/m?  ?Constitutional: generally well-appearing ?Psychiatric: alert and oriented x3 ?Abdomen: soft, nontender, nondistended, no  obvious ascites, no peritoneal signs, normal bowel sounds ?No peripheral edema noted in lower extremities ? ?Assessment and plan: ?51 y.o. male with cirrhosis from chronic hepatitis B ? ?Last MELD score 8 based on labs about 6 months ago.  He will need a repeat set of labs to calculate a MELD score and also hepatoma screening with alpha-fetoprotein and ultrasound of his liver.  He seems to be overall doing very well from a liver perspective.  He does have very low platelets chronically he does have varices and has been maximized on nonselective beta-blocker. ? ?He will lease return to see me in 1 year and sooner if any troubles. ? ?Please see the "Patient Instructions" section for addition details about the plan. ? ?Owens Loffler, MD ?[facility] Gastroenterology ?06/19/2021, 3:46 PM ? ? ?Total time on date of encounter was 25 minutes (this included time spent preparing to see the patient reviewing records; obtaining and/or reviewing separately obtained history; performing  a medically appropriate exam and/or evaluation; counseling and educating the patient and family if present; ordering medications, tests or procedures if applicable; and documenting clinical information in

## 2021-06-19 NOTE — Progress Notes (Signed)
Received a call from Santiago Glad in [facility] lab indicating critical WBC count of 1.3 and platelets of 23,000. Dr Ardis Hughs has been notified by phone. ?

## 2021-06-21 LAB — AFP TUMOR MARKER: AFP-Tumor Marker: 2.6 ng/mL (ref <6.1)

## 2021-06-26 ENCOUNTER — Ambulatory Visit (HOSPITAL_COMMUNITY)
Admission: RE | Admit: 2021-06-26 | Discharge: 2021-06-26 | Disposition: A | Discharge status: Home / Self Care | Payer: Managed Care, Other (non HMO) | Source: Ambulatory Visit | Attending: Gastroenterology | Admitting: Gastroenterology

## 2021-06-26 ENCOUNTER — Other Ambulatory Visit: Payer: Self-pay

## 2021-06-26 DIAGNOSIS — B181 Chronic viral hepatitis B without delta-agent: Secondary | ICD-10-CM

## 2021-06-26 DIAGNOSIS — K7460 Unspecified cirrhosis of liver: Secondary | ICD-10-CM | POA: Diagnosis present

## 2021-07-02 ENCOUNTER — Telehealth: Payer: Self-pay

## 2021-07-02 NOTE — Telephone Encounter (Signed)
[place] completed on 3/21. New staff reminder sent to [facility], RN for 6 month [place] reminder. ?

## 2021-07-02 NOTE — Telephone Encounter (Signed)
-----   Message from Timothy Lasso, RN sent at 01/02/2021 12:15 PM EDT ----- ?You will need repeat ultrasound of the liver in 6 months ? ?

## 2021-08-20 ENCOUNTER — Other Ambulatory Visit (HOSPITAL_COMMUNITY): Payer: Self-pay

## 2021-08-20 ENCOUNTER — Ambulatory Visit (INDEPENDENT_AMBULATORY_CARE_PROVIDER_SITE_OTHER): Payer: Self-pay | Admitting: Family

## 2021-08-20 ENCOUNTER — Other Ambulatory Visit: Payer: Self-pay

## 2021-08-20 ENCOUNTER — Encounter: Payer: Self-pay | Admitting: Family

## 2021-08-20 VITALS — BP 108/69 | HR 67 | Temp 98.4°F | Wt 116.0 lb

## 2021-08-20 DIAGNOSIS — B181 Chronic viral hepatitis B without delta-agent: Secondary | ICD-10-CM

## 2021-08-20 DIAGNOSIS — D696 Thrombocytopenia, unspecified: Secondary | ICD-10-CM

## 2021-08-20 DIAGNOSIS — K7460 Unspecified cirrhosis of liver: Secondary | ICD-10-CM

## 2021-08-20 DIAGNOSIS — B1910 Unspecified viral hepatitis B without hepatic coma: Secondary | ICD-10-CM

## 2021-08-20 MED ORDER — VEMLIDY 25 MG PO TABS: 1.0000 | ORAL_TABLET | Freq: Every day | ORAL | 11 refills | Status: DC

## 2021-08-20 NOTE — Progress Notes (Signed)
? ?Subjective:  ? ? Patient ID: Jose Snow, male    DOB: Apr 11, 1970, 51 y.o.   MRN: [id number] ? ?Chief Complaint  ?Patient presents with  ? Follow-up  ? ? ?HPI: ? ?Jose Snow is a 51 y.o. 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 Hepatitis B DNA was undetectable at that time with non-reactive Hepatitis B E Antigen or Hepatitis B E antibody. Seen by Gastroenterology on 06/19/21. US liver with no focal mass identified and cirrhoitc morphology. AFP tumor marker normal down from previous elevation. Here today for follow up. Jose Snow primary preferred language is [place] and a medical interpreter is present to aid in communication. ? ?Jose Snow has been taking his Viread daily as prescribed with no adverse side effects. Feeling okay with no new concerns/complaints. Denies nausea, vomiting, diarrhea, scleral icterus, jaundice, or excessive bleeding.  ? ? ?Allergies  ?Allergen Reactions  ? Other   ?  Yellow cough medicine makes him dizzy  ? ? ? ? ?Outpatient Medications Prior to Visit  ?Medication Sig Dispense Refill  ? B-D UF III MINI PEN NEEDLES 31G X 5 MM MISC daily. use as directed    ? fluticasone-salmeterol (ADVAIR DISKUS) 100-50 MCG/ACT AEPB Inhale 1 puff into the lungs 2 (two) times daily. 60 each 3  ? insulin glargine (LANTUS) 100 UNIT/ML injection Inject 6 Units into the skin daily.     ? Lancets (ONETOUCH DELICA PLUS [id number]) MISC Apply topically.    ? metFORMIN (GLUCOPHAGE) 500 MG tablet Take 500 mg by mouth daily.    ? omeprazole (PRILOSEC) 40 MG capsule TAKE 1 CAPSULE(40 MG) BY MOUTH DAILY BEFORE BREAKFAST 90 capsule 1  ? umeclidinium-vilanterol (ANORO ELLIPTA) 62.5-25 MCG/INH AEPB Inhale 1 puff into the lungs daily. 1 each 6  ? umeclidinium-vilanterol (ANORO ELLIPTA) 62.5-25 MCG/INH AEPB Inhale 1 puff into the lungs daily. 60 each 0  ? tenofovir (VIREAD) 300 MG tablet Take 1 tablet (300 mg total) by mouth daily. 30  tablet 11  ? albuterol (VENTOLIN HFA) 108 (90 Base) MCG/ACT inhaler Inhale 1-2 puffs into the lungs every 4 (four) hours as needed for wheezing or shortness of breath. 1 each 6  ? azithromycin (ZITHROMAX) 250 MG tablet Take 1 tablet (250 mg total) by mouth daily. Take first 2 tablets together, then 1 every day until finished. (Patient not taking: Reported on 06/19/2021) 6 tablet 0  ? nadolol (CORGARD) 20 MG tablet TAKE 1 TABLET(20 MG) BY MOUTH DAILY (Patient not taking: Reported on 08/20/2021) 90 tablet 0  ? ondansetron (ZOFRAN ODT) 4 MG disintegrating tablet Take 1 tablet (4 mg total) by mouth every 8 (eight) hours as needed for nausea or vomiting. (Patient not taking: Reported on 06/19/2021) 20 tablet 0  ? predniSONE (STERAPRED UNI-PAK 21 TAB) 10 MG (21) TBPK tablet Take by mouth daily. Take 6 tabs by mouth daily  for 2 days, then 5 tabs for 2 days, then 4 tabs for 2 days, then 3 tabs for 2 days, 2 tabs for 2 days, then 1 tab by mouth daily for 2 days (Patient not taking: Reported on 06/19/2021) 42 tablet 0  ? spironolactone (ALDACTONE) 100 MG tablet Take 1 tablet (100 mg total) by mouth daily. (Patient not taking: Reported on 06/19/2021) 90 tablet 3  ? ?No facility-administered medications prior to visit.  ? ? ? ?Past Medical History:  ?Diagnosis Date  ? Cirrhosis ([place])   ? Diabetes mellitus ([place])   ?  Hepatitis B   ? Liver cirrhosis secondary to NASH [facility])   ? Lymphocytic colitis   ? Pancytopenia ([place])   ? TB (tuberculosis)   ? 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   ? ? ? ?Past Surgical History:  ?Procedure Laterality Date  ? BIOPSY  10/23/2017  ? Procedure: BIOPSY;  Surgeon: Milus Banister, MD;  Location: [facility];  Service: Endoscopy;;  ? BIOPSY  03/03/2018  ? Procedure: BIOPSY;  Surgeon: Irving Copas., MD;  Location: [facility];  Service: Gastroenterology;;  ? COLONOSCOPY WITH PROPOFOL N/A 10/23/2017  ? Procedure: COLONOSCOPY WITH PROPOFOL;  Surgeon: Milus Banister, MD;  Location: [facility];   Service: Endoscopy;  Laterality: N/A;  ? COMPLEX WOUND CLOSURE Right 12/15/2015  ? Procedure: COMPLEX WOUND CLOSURE;  Surgeon: Leanora Cover, MD;  Location: [place];  Service: Orthopedics;  Laterality: Right;  ? ESOPHAGOGASTRODUODENOSCOPY (EGD) WITH PROPOFOL N/A 10/23/2017  ? Procedure: ESOPHAGOGASTRODUODENOSCOPY (EGD) WITH PROPOFOL;  Surgeon: Milus Banister, MD;  Location: [facility];  Service: Endoscopy;  Laterality: N/A;  ? ESOPHAGOGASTRODUODENOSCOPY (EGD) WITH PROPOFOL N/A 03/03/2018  ? Procedure: ESOPHAGOGASTRODUODENOSCOPY (EGD) WITH PROPOFOL;  Surgeon: Rush Landmark Telford Nab., MD;  Location: [place];  Service: Gastroenterology;  Laterality: N/A;  ? I & D EXTREMITY Right 12/15/2015  ? Procedure: IRRIGATION AND DEBRIDEMENT AND REVISION AMPUTATION RIGHT RING FINGER;  Surgeon: Leanora Cover, MD;  Location: [place];  Service: Orthopedics;  Laterality: Right;  ? ? ? ? ? ?Review of Systems  ?Constitutional:  Negative for chills, diaphoresis, fatigue and fever.  ?Respiratory:  Negative for cough, chest tightness, shortness of breath and wheezing.   ?Cardiovascular:  Negative for chest pain.  ?Gastrointestinal:  Negative for abdominal distention, abdominal pain, constipation, diarrhea, nausea and vomiting.  ?Neurological:  Negative for weakness and headaches.  ?Hematological:  Does not bruise/bleed easily.  ?   ?Objective:  ?  ?BP 108/69   Pulse 67   Temp 98.4 ?F (36.9 ?C) (Temporal)   Wt 116 lb (52.6 kg)   BMI 20.55 kg/m?  ?Nursing note and vital signs reviewed. ? ?Physical Exam ?Constitutional:   ?   General: He is not in acute distress. ?   Appearance: He is well-developed.  ?Cardiovascular:  ?   Rate and Rhythm: Normal rate and regular rhythm.  ?   Heart sounds: Normal heart sounds. No murmur heard. ?  No friction rub. No gallop.  ?Pulmonary:  ?   Effort: Pulmonary effort is normal. No respiratory distress.  ?   Breath sounds: Normal breath sounds. No wheezing or rales.  ?Chest:  ?   Chest wall: No tenderness.   ?Abdominal:  ?   General: Bowel sounds are normal. There is no distension.  ?   Palpations: Abdomen is soft. There is no mass.  ?   Tenderness: There is no abdominal tenderness. There is no guarding or rebound.  ?Skin: ?   General: Skin is warm and dry.  ?Neurological:  ?   Mental Status: He is alert and oriented to person, place, and time.  ?Psychiatric:     ?   Behavior: Behavior normal.     ?   Thought Content: Thought content normal.     ?   Judgment: Judgment normal.  ? ? ? ? ?  08/20/2021  ?  3:58 PM 11/13/2020  ?  3:54 PM 05/05/2020  ?  8:44 AM 12/22/2019  ?  4:36 PM 10/29/2019  ?  8:41 AM  ?Depression screen PHQ 2/9  ?Decreased  Interest 0 0 0 0 0  ?Down, Depressed, Hopeless 0 0 0 0 0  ?PHQ - 2 Score 0 0 0 0 0  ?  ?   ?Assessment & Plan:  ? ? ?Patient Active Problem List  ? Diagnosis Date Noted  ? Hepatitis B 02/17/2015  ?  Priority: High  ? Cirrhosis of liver due to hepatitis B ([place]) 11/25/2014  ?  Priority: High  ? Chronic cough 12/08/2019  ? Pancytopenia ([place]) 12/03/2017  ? Abnormal echocardiogram 12/03/2017  ? New onset type 2 diabetes mellitus ([place]) 12/03/2017  ? History of hepatitis B 12/03/2017  ? Esophageal varices without bleeding ([place])   ? Heart murmur 08/01/2017  ? Thrombocytopenia ([place]) 11/25/2014  ? History of TB (tuberculosis) 07/16/2010  ? ? ? ?Problem List Items Addressed This Visit   ? ?  ? Digestive  ? Cirrhosis of liver due to hepatitis B ([place])  ?  Appears to be compensated at present with Child-Pugh Score A.  Remains with significant thrombocytopenia. Imaging without evidence of lesions concerning for [place]. Continue management per GI with routine imaging.  ? ?  ?  ? Relevant Orders  ? Hepatitis B DNA, ultraquantitative, PCR  ? Hepatitis B e antigen  ? Hepatitis B e antibody  ? Hepatitis B surface antigen  ? Hepatitis B surface antibody,quantitative  ? Hepatitis B - Primary  ?  Jose Snow continues have virus suppression and good tolerance to Viread. Dicussed imaging results and lab work and  need to continue medication indefinitely. Financial assistance being completed and will change medication to [facility] to ease any financial costs. Check lab work today. Plan for follow up in 6 months or sooner if need

## 2021-08-20 NOTE — Assessment & Plan Note (Signed)
Appears to be compensated at present with Child-Pugh Score A.  Remains with significant thrombocytopenia. Imaging without evidence of lesions concerning for [place]. Continue management per GI with routine imaging.  ?

## 2021-08-20 NOTE — Patient Instructions (Addendum)
Nice to see you. ? ?We will check your lab work today. ? ?Continue to take your medication daily as prescribed. ? ?Refills have been sent to the pharmacy. ? ?Plan for follow up in 6 months or sooner if needed with lab work on the same day. ? ?Have a great day and stay safe! ? ?

## 2021-08-20 NOTE — Assessment & Plan Note (Signed)
Mr. Noy continues have virus suppression and good tolerance to Viread. Dicussed imaging results and lab work and need to continue medication indefinitely. Financial assistance being completed and will change medication to [facility] to ease any financial costs. Check lab work today. Plan for follow up in 6 months or sooner if needed.  ?

## 2021-08-21 ENCOUNTER — Telehealth: Payer: Self-pay

## 2021-08-21 ENCOUNTER — Other Ambulatory Visit (HOSPITAL_COMMUNITY): Payer: Self-pay

## 2021-08-21 NOTE — Telephone Encounter (Signed)
[facility] Patient Advocate Encounter ? ?Completed and sent Support Path application for [facility] for this patient who is uninsured.   ? ?Patient assistance phone number for follow up is [phone].  ? ?This encounter will be updated until final determination.  ? ?Ileene Patrick, CPhT ?Specialty Pharmacy Patient Advocate ?[facility] ?Phone: [phone] ?Fax:  [phone]  ?

## 2021-08-24 LAB — HEPATITIS B SURFACE ANTIBODY, QUANTITATIVE: Hep B S AB Quant (Post): <5 m[IU]/mL — ABNORMAL LOW (ref >=10)

## 2021-08-24 LAB — HEPATITIS B E ANTIGEN: Hep B E Ag: REACTIVE — AB

## 2021-08-24 LAB — HEPATITIS B SURFACE ANTIGEN: Hepatitis B Surface Ag: REACTIVE — AB

## 2021-08-24 LAB — HEPATITIS B DNA, ULTRAQUANTITATIVE, PCR
Hepatitis B DNA (Calc): 3.02 Log IU/mL — ABNORMAL HIGH
Hepatitis B DNA: 1040 IU/mL — ABNORMAL HIGH

## 2021-08-24 LAB — HEPATITIS B E ANTIBODY: Hep B E Ab: REACTIVE — AB

## 2021-09-11 ENCOUNTER — Other Ambulatory Visit (HOSPITAL_COMMUNITY): Payer: Self-pay

## 2021-09-14 ENCOUNTER — Telehealth: Payer: Self-pay

## 2021-09-14 NOTE — Telephone Encounter (Signed)
[facility] Patient Advocate Encounter  Completed and sent Support Path application for [facility] for this patient who is uninsured.    Patient is approved 09/12/21 through 04/07/22.  Medication will be delivered to the patient home from [facility] .  I faxed hardcopy to [place], [place] [facility] Phone: [phone] Fax:  [phone]

## 2021-11-05 ENCOUNTER — Other Ambulatory Visit: Payer: Self-pay | Admitting: Emergency Medicine

## 2021-11-05 DIAGNOSIS — K219 Gastro-esophageal reflux disease without esophagitis: Secondary | ICD-10-CM

## 2021-11-13 ENCOUNTER — Other Ambulatory Visit (HOSPITAL_COMMUNITY): Payer: Self-pay

## 2021-12-01 ENCOUNTER — Other Ambulatory Visit: Payer: Self-pay | Admitting: Family

## 2021-12-01 DIAGNOSIS — K7460 Unspecified cirrhosis of liver: Secondary | ICD-10-CM

## 2021-12-01 DIAGNOSIS — B181 Chronic viral hepatitis B without delta-agent: Secondary | ICD-10-CM

## 2021-12-21 ENCOUNTER — Telehealth: Payer: Self-pay

## 2021-12-21 DIAGNOSIS — K7460 Unspecified cirrhosis of liver: Secondary | ICD-10-CM

## 2021-12-21 NOTE — Telephone Encounter (Signed)
-----   Message from Stevan Born, [place] sent at 07/02/2021 11:34 AM EDT ----- Regarding: 6 month follow up Patient needs RUQ ultrasound per DJ dx hepatoma screening

## 2021-12-21 NOTE — Addendum Note (Signed)
Addended by: Stevan Born on: 12/21/2021 04:03 PM   Modules accepted: Orders

## 2021-12-21 NOTE — Telephone Encounter (Signed)
Liem (on [place] ) notified that patient is scheduled for abdominal ultrasound on 01-11-22 at 9:30am at [facility].  Liem advised patient to be NPO after midnight and to arrive at 9:00am.  Instructions also sent in [place].  Hinda Kehr agreed to plan and verbalized understanding.  No further questions.

## 2022-01-11 ENCOUNTER — Ambulatory Visit (HOSPITAL_COMMUNITY)
Admission: RE | Admit: 2022-01-11 | Discharge: 2022-01-11 | Disposition: A | Discharge status: Home / Self Care | Payer: Commercial Managed Care - HMO | Source: Ambulatory Visit | Attending: Internal Medicine | Admitting: Internal Medicine

## 2022-01-11 DIAGNOSIS — K7460 Unspecified cirrhosis of liver: Secondary | ICD-10-CM | POA: Diagnosis present

## 2022-01-15 ENCOUNTER — Other Ambulatory Visit: Payer: Self-pay

## 2022-01-15 ENCOUNTER — Other Ambulatory Visit (HOSPITAL_COMMUNITY): Payer: Self-pay

## 2022-01-15 MED ORDER — NADOLOL 20 MG PO TABS: 20.0000 mg | ORAL_TABLET | Freq: Every day | ORAL | 2 refills | Status: DC

## 2022-01-31 ENCOUNTER — Encounter: Payer: Self-pay | Admitting: Emergency Medicine

## 2022-01-31 ENCOUNTER — Ambulatory Visit (INDEPENDENT_AMBULATORY_CARE_PROVIDER_SITE_OTHER): Payer: Commercial Managed Care - HMO | Admitting: Emergency Medicine

## 2022-01-31 VITALS — BP 110/68 | HR 63 | Temp 98.2°F | Ht 63.0 in | Wt 123.1 lb

## 2022-01-31 DIAGNOSIS — I8510 Secondary esophageal varices without bleeding: Secondary | ICD-10-CM | POA: Diagnosis not present

## 2022-01-31 DIAGNOSIS — J449 Chronic obstructive pulmonary disease, unspecified: Secondary | ICD-10-CM | POA: Insufficient documentation

## 2022-01-31 DIAGNOSIS — B1910 Unspecified viral hepatitis B without hepatic coma: Secondary | ICD-10-CM

## 2022-01-31 DIAGNOSIS — D696 Thrombocytopenia, unspecified: Secondary | ICD-10-CM

## 2022-01-31 DIAGNOSIS — C220 Liver cell carcinoma: Secondary | ICD-10-CM

## 2022-01-31 DIAGNOSIS — K7460 Unspecified cirrhosis of liver: Secondary | ICD-10-CM | POA: Diagnosis not present

## 2022-01-31 DIAGNOSIS — E1169 Type 2 diabetes mellitus with other specified complication: Secondary | ICD-10-CM

## 2022-01-31 DIAGNOSIS — Z23 Encounter for immunization: Secondary | ICD-10-CM | POA: Diagnosis not present

## 2022-01-31 DIAGNOSIS — D61818 Other pancytopenia: Secondary | ICD-10-CM

## 2022-01-31 LAB — POCT GLYCOSYLATED HEMOGLOBIN (HGB A1C): Hemoglobin A1C: 6 % — AB (ref 4.0–5.6)

## 2022-01-31 MED ORDER — ALBUTEROL SULFATE HFA 108 (90 BASE) MCG/ACT IN AERS: 1.0000 | INHALATION_SPRAY | RESPIRATORY_TRACT | 6 refills | Status: DC | PRN

## 2022-01-31 MED ORDER — UMECLIDINIUM-VILANTEROL 62.5-25 MCG/ACT IN AEPB: 1.0000 | INHALATION_SPRAY | Freq: Every day | RESPIRATORY_TRACT | 7 refills | Status: DC

## 2022-01-31 NOTE — Assessment & Plan Note (Signed)
Stable.  No recent bleeding episodes However states when he cuts himself he bleeds longer than usual. Has history of thrombocytopenia secondary to portal hypertension

## 2022-01-31 NOTE — Assessment & Plan Note (Addendum)
Stable.  Non-smoker. Continue Anoro Ellipta 1 puff daily and albuterol rescue inhaler as needed

## 2022-01-31 NOTE — Progress Notes (Signed)
Jose [facility] 51 y.o.   Chief Complaint  Patient presents with   Follow-up    Last ov back in 2021, concerns about lung and liver issues     HISTORY OF PRESENT ILLNESS: This is a 51 y.o. 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  Sees infectious disease and GI doctor on a regular basis. Also has history of diabetes on metformin and insulin glargine. Also has history of COPD.  Needs refill of inhalers. Overall doing well.  Stable. Has no complaints or any other medical concerns today.  Most recent infectious diseases and GI doctors office notes assessment and plans as follows: Assessment and plan: 51 y.o. male with cirrhosis from chronic hepatitis B   Last MELD score 8 based on labs about 6 months ago.  He will need a repeat set of labs to calculate a MELD score and also hepatoma screening with alpha-fetoprotein and ultrasound of his liver.  He seems to be overall doing very well from a liver perspective.  He does have very low platelets chronically he does have varices and has been maximized on nonselective beta-blocker.   He will lease return to see me in 1 year and sooner if any troubles.   Please see the "Patient Instructions" section for addition details about the plan.   Jose Loffler, MD [facility] 06/19/2021, 3:46 PM  Problem List Items Addressed This Visit              Digestive    Cirrhosis of liver due to hepatitis B ([facility])      Appears to be compensated at present with Child-Pugh Score A.  Remains with significant thrombocytopenia. Imaging without evidence of lesions concerning for [place]. Continue management per GI with routine imaging.           Relevant Orders    Hepatitis B DNA, ultraquantitative, PCR    Hepatitis B e antigen    Hepatitis B e antibody    Hepatitis B surface antigen    Hepatitis B surface  antibody,quantitative    Hepatitis B - Primary      Jose [facility] continues have virus suppression and good tolerance to Viread. Dicussed imaging results and lab work and need to continue medication indefinitely. Financial assistance being completed and will change medication to [facility] to ease any financial costs. Check lab work today. Plan for follow up in 6 months or sooner if needed.           Relevant Medications    Tenofovir Alafenamide Fumarate (VEMLIDY) 25 MG TABS    Other Relevant Orders    Hepatitis B DNA, ultraquantitative, PCR    Hepatitis B e antigen    Hepatitis B e antibody    Hepatitis B surface antigen    Hepatitis B surface antibody,quantitative        Hematopoietic and Hemostatic    Thrombocytopenia ([facility])        I have discontinued Jose [facility]'s tenofovir. I am also having him start on Vemlidy. Additionally, I am having him maintain his insulin glargine, metFORMIN, B-D UF III MINI PEN NEEDLES, ondansetron, spironolactone, Anoro Ellipta, albuterol, Anoro Ellipta, fluticasone-salmeterol, OneTouch Delica Plus [id number], omeprazole, predniSONE, azithromycin, and nadolol.  HPI   Prior to Admission medications   Medication Sig Start Date End Date Taking? Authorizing Provider  omeprazole (PRILOSEC) 40 MG capsule TAKE 1 CAPSULE(40 MG) BY MOUTH DAILY BEFORE AND  BREAKFAST 11/05/21  Yes [facility], Jose Bloomer, MD  Tenofovir Alafenamide Fumarate (VEMLIDY) 25 MG TABS Take 1 tablet (25 mg total) by mouth daily. 08/20/21  Yes Jose Circle, FNP  albuterol (VENTOLIN HFA) 108 (90 Base) MCG/ACT inhaler Inhale 1-2 puffs into the lungs every 4 (four) hours as needed for wheezing or shortness of breath. 02/04/20 03/05/20  Jose Seeds, MD  B-D UF III MINI PEN NEEDLES 31G X 5 MM MISC daily. use as directed 06/21/19   [provider]  fluticasone-salmeterol (ADVAIR DISKUS) 100-50 MCG/ACT AEPB Inhale 1 puff into the lungs 2 (two) times daily. 11/01/20   Jose Seeds, MD   insulin glargine (LANTUS) 100 UNIT/ML injection Inject 6 Units into the skin daily.     [provider]  Lancets (ONETOUCH DELICA PLUS [id number]) [facility] Apply topically. 09/06/20   [provider]  metFORMIN (GLUCOPHAGE) 500 MG tablet Take 500 mg by mouth daily. 05/07/19   [provider]  nadolol (CORGARD) 20 MG tablet Take 1 tablet (20 mg total) by mouth daily. 01/15/22   Jose Mayer, MD  umeclidinium-vilanterol (ANORO ELLIPTA) 62.5-25 MCG/INH AEPB Inhale 1 puff into the lungs daily. 02/04/20   Jose Seeds, MD  umeclidinium-vilanterol [facility] ELLIPTA) 62.5-25 MCG/INH AEPB Inhale 1 puff into the lungs daily. 02/04/20   Jose Seeds, MD    Allergies  Allergen Reactions   Other     Yellow cough medicine makes him dizzy    Patient Active Problem List   Diagnosis Date Noted   Hepatoma ([place]) 01/31/2022   Moderate COPD (chronic obstructive pulmonary disease) ([place]) 01/31/2022   Liver cirrhosis secondary to NASH (HCC)    Chronic cough 12/08/2019   Pancytopenia ([place]) 12/03/2017   Abnormal echocardiogram 12/03/2017   History of hepatitis B 12/03/2017   Type 2 diabetes mellitus with other specified complication, without long-term current use of insulin (HCC)    Esophageal varices without bleeding ([place])    Heart murmur 08/01/2017   Hepatitis B 02/17/2015   Cirrhosis of liver due to hepatitis B ([place]) 11/25/2014   Thrombocytopenia ([place]) 11/25/2014   History of TB (tuberculosis) 07/16/2010    Past Medical History:  Diagnosis Date   Cirrhosis ([place])    Diabetes mellitus ([place])    Hepatitis B    Liver cirrhosis secondary to NASH ([place])    Lymphocytic colitis    Pancytopenia ([address])    TB (tuberculosis)    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     Past Surgical History:  Procedure Laterality Date   BIOPSY  10/23/2017   Procedure: BIOPSY;  Surgeon: Milus Banister, MD;  Location: [facility];  Service: [facility];;   BIOPSY  03/03/2018   Procedure: BIOPSY;   Surgeon: Jose Copas., MD;  Location: [facility];  Service: Gastroenterology;;   COLONOSCOPY WITH PROPOFOL N/A 10/23/2017   Procedure: COLONOSCOPY WITH PROPOFOL;  Surgeon: Milus Banister, MD;  Location: [facility];  Service: [facility];  Laterality: N/A;   COMPLEX WOUND CLOSURE Right 12/15/2015   Procedure: COMPLEX WOUND CLOSURE;  Surgeon: Leanora Cover, MD;  Location: [place];  Service: Orthopedics;  Laterality: Right;   ESOPHAGOGASTRODUODENOSCOPY (EGD) WITH PROPOFOL N/A 10/23/2017   Procedure: ESOPHAGOGASTRODUODENOSCOPY (EGD) WITH PROPOFOL;  Surgeon: Milus Banister, MD;  Location[facility];  Service: [facility];  Laterality: N/A;   ESOPHAGOGASTRODUODENOSCOPY (EGD) WITH PROPOFOL N/A 03/03/2018   Procedure: ESOPHAGOGASTRODUODENOSCOPY (EGD) WITH PROPOFOL;  Surgeon: Rush Landmark Telford Nab., MD;  Location: [place];  Service: Gastroenterology;  Laterality: N/A;  I & D EXTREMITY Right 12/15/2015   Procedure: IRRIGATION AND DEBRIDEMENT AND REVISION AMPUTATION RIGHT RING FINGER;  Surgeon: Leanora Cover, MD;  Location: [place];  Service: Orthopedics;  Laterality: Right;    Social History   Socioeconomic History   Marital status: Married    Spouse name: Not on file   Number of children: 4   Years of education: Not on file   Highest education level: Not on file  Occupational History   Occupation: [employer]    Comment: Builds chair parts  Tobacco Use   Smoking status: Former    Packs/day: 0.50    Years: 8.00    Total pack years: 4.00    Types: Cigarettes    Quit date: 04/08/1990    Years since quitting: 31.8   Smokeless tobacco: Never  Vaping Use   Vaping Use: Never used  Substance and Sexual Activity   Alcohol use: No   Drug use: No   Sexual activity: Yes  Other Topics Concern   Not on file  Social History Narrative   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  Lives with wife and four children.    Social Determinants of Health   Financial Resource Strain: Not on file  Food Insecurity: Not  on file  Transportation Needs: Not on file  Physical Activity: Not on file  Stress: Not on file  Social Connections: Not on file  Intimate Partner Violence: Not on file    Family History  Problem Relation Age of Onset   Colon cancer Neg Hx    Esophageal cancer Neg Hx    Pancreatic cancer Neg Hx    Stomach cancer Neg Hx    Liver disease Neg Hx      Review of Systems  Constitutional: Negative.  Negative for chills and fever.  HENT: Negative.  Negative for congestion and sore throat.   Respiratory: Negative.  Negative for cough and shortness of breath.   Cardiovascular: Negative.  Negative for chest pain and palpitations.  Gastrointestinal: Negative.  Negative for abdominal pain, nausea and vomiting.  Genitourinary: Negative.  Negative for dysuria and hematuria.  Skin: Negative.  Negative for rash.  Neurological: Negative.  Negative for dizziness and headaches.  All other systems reviewed and are negative.  Today's Vitals   01/31/22 1402  BP: 110/68  Pulse: 63  Temp: 98.2 F (36.8 C)  TempSrc: Oral  SpO2: 95%  Weight: 123 lb 2 oz (55.8 kg)  Height: 5' 3"  (1.6 m)   Body mass index is 21.81 kg/m.   Physical Exam Vitals reviewed.  Constitutional:      Appearance: Normal appearance.  HENT:     Head: Normocephalic.     Mouth/Throat:     Mouth: Mucous membranes are moist.     Pharynx: Oropharynx is clear.  Eyes:     Extraocular Movements: Extraocular movements intact.     Pupils: Pupils are equal, round, and reactive to light.  Cardiovascular:     Rate and Rhythm: Normal rate and regular rhythm.     Pulses: Normal pulses.     Heart sounds: Normal heart sounds.  Pulmonary:     Effort: Pulmonary effort is normal.     Breath sounds: Normal breath sounds.  Abdominal:     Palpations: Abdomen is soft.     Tenderness: There is no abdominal tenderness.  Musculoskeletal:     Cervical back: No tenderness.     Right lower leg: No edema.     Left lower leg: No edema.  Lymphadenopathy:     Cervical: No cervical adenopathy.  Skin:    General: Skin is warm and dry.  Neurological:     General: No focal deficit present.     Mental Status: He is alert and oriented to person, place, and time.  Psychiatric:        Mood and Affect: Mood normal.        Behavior: Behavior normal.    Results for orders placed or performed in visit on 01/31/22 (from the past 24 hour(s))  POCT HgB A1C     Status: Abnormal   Collection Time: 01/31/22  2:13 PM  Result Value Ref Range   Hemoglobin A1C 6.0 (A) 4.0 - 5.6 %   HbA1c POC (<> result, manual entry)     HbA1c, POC (prediabetic range)     HbA1c, POC (controlled diabetic range)       ASSESSMENT & PLAN: A total of 48 minutes was spent with the patient and counseling/coordination of care regarding preparing for this visit, review of most recent office visit notes, review of most recent infectious diseases and gastrointestinal office visit notes, review of multiple chronic medical problems and their management, review of all medications, cardiovascular risks associated with diabetes, education on nutrition, review of most recent blood work results including interpretation of today's hemoglobin A1c, prognosis, documentation, need for follow-up.  Problem List Items Addressed This Visit       Cardiovascular and Mediastinum   Esophageal varices without bleeding (HCC)     Respiratory   Moderate COPD (chronic obstructive pulmonary disease) (HCC)    Stable.  Non-smoker. Continue Anoro Ellipta 1 puff daily and albuterol rescue inhaler as needed      Relevant Medications   umeclidinium-vilanterol (ANORO ELLIPTA) 62.5-25 MCG/ACT AEPB   albuterol (VENTOLIN HFA) 108 (90 Base) MCG/ACT inhaler     Digestive   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)    Stable.  No recent bleeding episodes However states when he cuts himself he bleeds longer than usual. 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 Stable appearance of liver.        Endocrine   Type 2 diabetes mellitus with other specified complication, without long-term current use of insulin (HCC) - Primary    Stable with hemoglobin A1c of 6.0. Continue metformin 500 mg twice a day and insulin glargine 6 units daily      Relevant Orders   POCT HgB A1C (Completed)     Hematopoietic and Hemostatic   Thrombocytopenia (HCC)    Stable.  No recent bleeding or upper GI bleed      Pancytopenia (HCC)   Other Visit Diagnoses     Need for vaccination       Relevant Orders   Flu Vaccine QUAD 6+ mos PF IM (Fluarix Quad PF)   Zoster Recombinant (Shingrix )      Patient Instructions  Diabetes Mellitus and Nutrition, Adult When you have diabetes, or diabetes mellitus, it is very important to have healthy eating habits because your blood sugar (glucose) levels are greatly affected by what you eat and drink. Eating healthy foods in the right amounts, at about the same times every day, can help you: Manage your blood glucose. Lower your risk of heart disease. Improve your blood pressure. Reach or maintain a healthy weight. What can affect my meal plan? Every person with diabetes  is different, and each person has different needs for a meal plan. Your health care provider may recommend that you work with a dietitian to make a meal plan that is best for you. Your meal plan may vary depending on factors such as: The calories you need. The medicines you take. Your weight. Your blood glucose, blood pressure, and cholesterol levels. Your activity level. Other health conditions you have, such as heart or kidney disease. How do carbohydrates affect me? Carbohydrates, also called carbs, affect your blood glucose level more than any other type of food. Eating carbs raises the amount of glucose in your blood. It is important to know how many carbs you can  safely have in each meal. This is different for every person. Your dietitian can help you calculate how many carbs you should have at each meal and for each snack. How does alcohol affect me? Alcohol can cause a decrease in blood glucose (hypoglycemia), especially if you use insulin or take certain diabetes medicines by mouth. Hypoglycemia can be a life-threatening condition. Symptoms of hypoglycemia, such as sleepiness, dizziness, and confusion, are similar to symptoms of having too much alcohol. Do not drink alcohol if: Your health care provider tells you not to drink. You are pregnant, may be pregnant, or are planning to become pregnant. If you drink alcohol: Limit how much you have to: 0-1 drink a day for women. 0-2 drinks a day for men. Know how much alcohol is in your drink. In the [place] one drink equals one 12 oz bottle of beer (355 mL), one 5 oz glass of wine (148 mL), or one 1 oz glass of hard liquor (44 mL). Keep yourself hydrated with water, diet soda, or unsweetened iced tea. Keep in mind that regular soda, juice, and other mixers may contain a lot of sugar and must be counted as carbs. What are tips for following this plan?  Reading food labels Start by checking the serving size on the Nutrition Facts label of packaged foods and drinks. The number of calories and the amount of carbs, fats, and other nutrients listed on the label are based on one serving of the item. Many items contain more than one serving per package. Check the total grams (g) of carbs in one serving. Check the number of grams of saturated fats and trans fats in one serving. Choose foods that have a low amount or none of these fats. Check the number of milligrams (mg) of salt (sodium) in one serving. Most people should limit total sodium intake to less than 2,300 mg per day. Always check the nutrition information of foods labeled as "low-fat" or "nonfat." These foods may be higher in added sugar or refined carbs and  should be avoided. Talk to your dietitian to identify your daily goals for nutrients listed on the label. Shopping Avoid buying canned, pre-made, or processed foods. These foods tend to be high in fat, sodium, and added sugar. Shop around the outside edge of the grocery store. This is where you will most often find fresh fruits and vegetables, bulk grains, fresh meats, and fresh dairy products. Cooking Use low-heat cooking methods, such as baking, instead of high-heat cooking methods, such as deep frying. Cook using healthy oils, such as olive, canola, or sunflower oil. Avoid cooking with butter, cream, or high-fat meats. Meal planning Eat meals and snacks regularly, preferably at the same times every day. Avoid going long periods of time without eating. Eat foods that are high in fiber, such  as fresh fruits, vegetables, beans, and whole grains. Eat 4-6 oz (112-168 g) of lean protein each day, such as lean meat, chicken, fish, eggs, or tofu. One ounce (oz) (28 g) of lean protein is equal to: 1 oz (28 g) of meat, chicken, or fish. 1 egg.  cup (62 g) of tofu. Eat some foods each day that contain healthy fats, such as avocado, nuts, [place], and fish. What foods should I eat? Fruits Berries. Apples. Oranges. Peaches. Apricots. Plums. Grapes. Mangoes. Papayas. Pomegranates. Kiwi. Cherries. Vegetables Leafy greens, including lettuce, spinach, kale, chard, collard greens, mustard greens, and cabbage. Beets. Cauliflower. Broccoli. Carrots. Green beans. Tomatoes. Peppers. Onions. Cucumbers. Brussels sprouts. Grains Whole grains, such as whole-wheat or whole-grain bread, crackers, tortillas, cereal, and pasta. Unsweetened oatmeal. Quinoa. Brown or wild rice. Meats and other proteins Seafood. Poultry without skin. Lean cuts of poultry and beef. Tofu. Nuts. [place]. Dairy Low-fat or fat-free dairy products such as milk, yogurt, and cheese. The items listed above may not be a complete list of foods and  beverages you can eat and drink. Contact a dietitian for more information. What foods should I avoid? Fruits Fruits canned with syrup. Vegetables Canned vegetables. Frozen vegetables with butter or cream sauce. Grains Refined white flour and flour products such as bread, pasta, snack foods, and cereals. Avoid all processed foods. Meats and other proteins Fatty cuts of meat. Poultry with skin. Breaded or fried meats. Processed meat. Avoid saturated fats. Dairy Full-fat yogurt, cheese, or milk. Beverages Sweetened drinks, such as soda or iced tea. The items listed above may not be a complete list of foods and beverages you should avoid. Contact a dietitian for more information. Questions to ask a health care provider Do I need to meet with a certified diabetes care and education specialist? Do I need to meet with a dietitian? What number can I call if I have questions? When are the best times to check my blood glucose? Where to find more information: American Diabetes Association: diabetes.org Academy of Nutrition and Dietetics: eatright.[employer] of Diabetes and Digestive and Kidney Diseases: [employer]en[employer].is Association of Diabetes Care & Education Specialists: diabeteseducator.org Summary It is important to have healthy eating habits because your blood sugar (glucose) levels are greatly affected by what you eat and drink. It is important to use alcohol carefully. A healthy meal plan will help you manage your blood glucose and lower your risk of heart disease. Your health care provider may recommend that you work with a dietitian to make a meal plan that is best for you. This information is not intended to replace advice given to you by your health care provider. Make sure you discuss any questions you have with your health care provider. Document Revised: 10/27/2019 Document Reviewed: 10/27/2019 Elsevier Patient Education  [place],  MD [facility]

## 2022-01-31 NOTE — Assessment & Plan Note (Signed)
Stable with hemoglobin A1c of 6.0. Continue metformin 500 mg twice a day and insulin glargine 6 units daily

## 2022-01-31 NOTE — Assessment & Plan Note (Signed)
Abdominal ultrasound done earlier this month shows no tumors. Stable appearance of liver.

## 2022-01-31 NOTE — Patient Instructions (Signed)

## 2022-01-31 NOTE — Assessment & Plan Note (Signed)
Stable.  No recent bleeding or upper GI bleed

## 2022-02-11 ENCOUNTER — Other Ambulatory Visit (HOSPITAL_COMMUNITY): Payer: Self-pay

## 2022-02-11 ENCOUNTER — Ambulatory Visit: Payer: Commercial Managed Care - HMO | Admitting: Family

## 2022-02-11 ENCOUNTER — Encounter: Payer: Self-pay | Admitting: Family

## 2022-02-11 ENCOUNTER — Other Ambulatory Visit: Payer: Self-pay

## 2022-02-11 VITALS — BP 110/69 | HR 59 | Temp 98.2°F | Resp 16 | Wt 120.0 lb

## 2022-02-11 DIAGNOSIS — B1910 Unspecified viral hepatitis B without hepatic coma: Secondary | ICD-10-CM

## 2022-02-11 DIAGNOSIS — K7460 Unspecified cirrhosis of liver: Secondary | ICD-10-CM

## 2022-02-11 DIAGNOSIS — B181 Chronic viral hepatitis B without delta-agent: Secondary | ICD-10-CM

## 2022-02-11 MED ORDER — VEMLIDY 25 MG PO TABS: 1.0000 | ORAL_TABLET | Freq: Every day | ORAL | 11 refills | Status: DC

## 2022-02-11 NOTE — Assessment & Plan Note (Signed)
Dustyn has been off medication for a period of time and fortunately has had no symptoms of flare or other complications. Discussed importance of taking medication daily to reduce progression of virus in the setting of cirrhosis. Most recent ultrasound without evidence of hepatoma. Will check Hepatitis B lab work today including alpha-fetoprotein. Due for additional [place] in 6 months. Continue current dose of Vemlidy through end of the year and then will transition to Viread given his new insurance. Will arrange for follow up the first week in January to ensure smooth transition and avoid barriers related to language.

## 2022-02-11 NOTE — Patient Instructions (Addendum)
Nice to see you.  We will check your lab work today.  Take your medication daily as prescribed.  Refills have been sent to the pharmacy.  Plan for follow up in 6 months or sooner if needed with lab work on the same day.  Have a great day and stay safe!

## 2022-02-11 NOTE — Progress Notes (Signed)
Subjective:    Patient ID: Jose Snow, male    DOB: 1970-10-20, 51 y.o.   MRN: [id number]  Chief Complaint  Patient presents with   Follow-up    Chronic viral hepatitis B without delta agent and without coma       HPI:  Jose Snow is a 51 y.o. 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 Lab with with Hepatitis B DNA level of 1040 and positive Hepatitis B E antibody and Hepatitis B e antigen. Completed US abdomen with cirrhotic morphology and no focal lesions. Here today for follow up. Jose Snow primary preferred language is [place] and a medical interpreter is present to aid in communication.  Jose Snow has not been taking his Vemlidy and has confusion about what medications he has been taking. Per fill records his last fill of the [facility]lee [facility] was end of July. He has financial assistance through the end of the year for [facility]. Has new insurance coverage through [place]. Feels about the same as last time he was seen and denies abdominal pain, nausea, vomiting, fatigue, fever, scleral icterus or jaundice.     Allergies  Allergen Reactions   Other     Yellow cough medicine makes him dizzy      Outpatient Medications Prior to Visit  Medication Sig Dispense Refill   albuterol (VENTOLIN HFA) 108 (90 Base) MCG/ACT inhaler Inhale 1-2 puffs into the lungs every 4 (four) hours as needed for wheezing or shortness of breath. 1 each 6   B-D UF III MINI PEN NEEDLES 31G X 5 MM MISC daily. use as directed     insulin glargine (LANTUS) 100 UNIT/ML injection Inject 6 Units into the skin daily.      Lancets (ONETOUCH DELICA PLUS [id number]) MISC Apply topically.     metFORMIN (GLUCOPHAGE) 500 MG tablet Take 500 mg by mouth daily.     nadolol (CORGARD) 20 MG tablet Take 1 tablet (20 mg total) by mouth daily. 90 tablet 2   omeprazole (PRILOSEC) 40 MG capsule TAKE 1 CAPSULE(40 MG) BY MOUTH DAILY BEFORE AND BREAKFAST  90 capsule 1   umeclidinium-vilanterol (ANORO ELLIPTA) 62.5-25 MCG/ACT AEPB Inhale 1 puff into the lungs daily at 6 (six) AM. 1 each 7   Tenofovir Alafenamide Fumarate (VEMLIDY) 25 MG TABS Take 1 tablet (25 mg total) by mouth daily. (Patient not taking: Reported on 02/11/2022) 30 tablet 11   No facility-administered medications prior to visit.     Past Medical History:  Diagnosis Date   Cirrhosis ([place])    Diabetes mellitus ([place])    Hepatitis B    Liver cirrhosis secondary to NASH ([place])    Lymphocytic colitis    Pancytopenia ([place])    TB (tuberculosis)    treated 15 months at health dept.      Past Surgical History:  Procedure Laterality Date   BIOPSY  10/23/2017   Procedure: BIOPSY;  Surgeon: Milus Banister, MD;  [facility];  Service: Endoscopy;;   BIOPSY  03/03/2018   Procedure: BIOPSY;  Surgeon: Irving Copas., MD;  Location: [place];  Service: Gastroenterology;;   COLONOSCOPY WITH PROPOFOL N/A 10/23/2017   Procedure: COLONOSCOPY WITH PROPOFOL;  Surgeon: Milus Banister, MD;  [facility];  Service: Endoscopy;  Laterality: N/A;   COMPLEX WOUND CLOSURE Right 12/15/2015   Procedure: COMPLEX WOUND CLOSURE;  Surgeon: Leanora Cover, MD;  Location: [place];  Service: Orthopedics;  Laterality: Right;  ESOPHAGOGASTRODUODENOSCOPY (EGD) WITH PROPOFOL N/A 10/23/2017   Procedure: ESOPHAGOGASTRODUODENOSCOPY (EGD) WITH PROPOFOL;  Surgeon: Milus Banister, MD;  [facility];  Service: Endoscopy;  Laterality: N/A;   ESOPHAGOGASTRODUODENOSCOPY (EGD) WITH PROPOFOL N/A 03/03/2018   Procedure: ESOPHAGOGASTRODUODENOSCOPY (EGD) WITH PROPOFOL;  Surgeon: Rush Landmark Telford Nab., MD;  Location: [place];  Service: Gastroenterology;  Laterality: N/A;   I & D EXTREMITY Right 12/15/2015   Procedure: IRRIGATION AND DEBRIDEMENT AND REVISION AMPUTATION RIGHT RING FINGER;  Surgeon: Leanora Cover, MD;  Location: [place];  Service: Orthopedics;  Laterality: Right;        Review of Systems  Constitutional:  Negative for chills, diaphoresis, fatigue and fever.  Respiratory:  Negative for cough, chest tightness, shortness of breath and wheezing.   Cardiovascular:  Negative for chest pain.  Gastrointestinal:  Negative for abdominal distention, abdominal pain, constipation, diarrhea, nausea and vomiting.  Neurological:  Negative for weakness and headaches.  Hematological:  Does not bruise/bleed easily.      Objective:    BP 110/69   Pulse (!) 59   Temp 98.2 F (36.8 C) (Oral)   Resp 16   Wt 120 lb (54.4 kg)   SpO2 99%   BMI 21.26 kg/m  Nursing note and vital signs reviewed.  Physical Exam Constitutional:      General: He is not in acute distress.    Appearance: He is well-developed.  Cardiovascular:     Rate and Rhythm: Normal rate and regular rhythm.     Heart sounds: Normal heart sounds. No murmur heard.    No friction rub. No gallop.  Pulmonary:     Effort: Pulmonary effort is normal. No respiratory distress.     Breath sounds: Normal breath sounds. No wheezing or rales.  Chest:     Chest wall: No tenderness.  Abdominal:     General: Bowel sounds are normal. There is no distension.     Palpations: Abdomen is soft. There is no mass.     Tenderness: There is no abdominal tenderness. There is no guarding or rebound.  Skin:    General: Skin is warm and dry.  Neurological:     Mental Status: He is alert and oriented to person, place, and time.  Psychiatric:        Behavior: Behavior normal.        Thought Content: Thought content normal.        Judgment: Judgment normal.         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 ([place]) 11/25/2014    Priority: High   Hepatoma ([place]) 01/31/2022   Moderate COPD (chronic obstructive pulmonary disease) ([place]) 01/31/2022   Liver cirrhosis secondary to NASH (H[facility])    Chronic cough 12/08/2019   Pancytopenia ([place]) 12/03/2017   Abnormal echocardiogram 12/03/2017   History of hepatitis B 12/03/2017   Type 2 diabetes mellitus with other specified complication, without long-term current use of insulin ([place])  Esophageal varices without bleeding (H[facility])    Heart murmur 08/01/2017   Thrombocytopenia ([place]) 11/25/2014   History of TB (tuberculosis) 07/16/2010     Problem List Items Addressed This Visit       Digestive   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 Follows with GI. Check lab work today.       Relevant Orders   Hepatitis B DNA, ultraquantitative, PCR   Hepatitis B surface antigen   Hepatitis B surface antibody,quantitative   Hepatitis B e antibody   Hepatitis B e antigen   Hepatic function panel   Protime-INR   CBC   AFP tumor marker   Hepatitis B - Primary    Edahi has been off medication for a period of time and fortunately has had no symptoms of flare or other complications. Discussed importance of taking medication daily to reduce progression of virus in the setting of cirrhosis. Most recent ultrasound without evidence of hepatoma. Will check Hepatitis B lab work today including alpha-fetoprotein. Due for additional [place] in 6 months. Continue current dose of Vemlidy through end of the year and then will transition to Viread given his new insurance. Will arrange for follow up the first week in January to ensure smooth transition and avoid barriers related to language.       Relevant Orders   Hepatitis B DNA, ultraquantitative, PCR   Hepatitis B surface antigen   Hepatitis B surface antibody,quantitative   Hepatitis B e antibody   Hepatitis B e  antigen   Hepatic function panel   Protime-INR   CBC   AFP tumor marker     I have discontinued [employer] and [place]. I am also having him maintain his insulin glargine, metFORMIN, B-D UF III MINI PEN NEEDLES, OneTouch Delica Plus [id number], omeprazole, nadolol, umeclidinium-vilanterol, and albuterol.   Meds ordered this encounter  Medications   DISCONTD: Tenofovir Alafenamide Fumarate (VEMLIDY) 25 MG TABS    Sig: Take 1 tablet (25 mg total) by mouth daily.    Dispense:  30 tablet    Refill:  11    Order Specific Question:   Supervising Provider    Answer:   Carlyle Basques [[id number]]     Follow-up: Return in about 2 months (around 04/13/2022), or if symptoms worsen or fail to improve.   Terri Piedra, MSN, FNP-C Nurse Practitioner [facility] for Infectious Disease [place] number: [phone]

## 2022-02-11 NOTE — Assessment & Plan Note (Signed)
Appears to be compensated and stable at present. Follows with GI. Check lab work today.

## 2022-02-13 ENCOUNTER — Telehealth: Payer: Self-pay

## 2022-02-13 ENCOUNTER — Other Ambulatory Visit (HOSPITAL_COMMUNITY): Payer: Self-pay

## 2022-02-13 LAB — CBC
HCT: 34.7 % — ABNORMAL LOW (ref 38.5–50.0)
Hemoglobin: 12.3 g/dL — ABNORMAL LOW (ref 13.2–17.1)
MCH: 32 pg (ref 27.0–33.0)
MCHC: 35.4 g/dL (ref 32.0–36.0)
MCV: 90.4 fL (ref 80.0–100.0)
Platelets: 28 10*3/uL — ABNORMAL LOW (ref 140–400)
RBC: 3.84 10*6/uL — ABNORMAL LOW (ref 4.20–5.80)
RDW: 15.1 % — ABNORMAL HIGH (ref 11.0–15.0)
WBC: 3 10*3/uL — ABNORMAL LOW (ref 3.8–10.8)

## 2022-02-13 LAB — HEPATITIS B DNA, ULTRAQUANTITATIVE, PCR
Hepatitis B DNA: 38 IU/mL — ABNORMAL HIGH
Hepatitis B virus DNA: 1.58 Log IU/mL — ABNORMAL HIGH

## 2022-02-13 LAB — HEPATITIS B E ANTIGEN: Hep B E Ag: REACTIVE — AB

## 2022-02-13 LAB — HEPATIC FUNCTION PANEL
AG Ratio: 1.2 (calc) (ref 1.0–2.5)
ALT: 36 U/L (ref 9–46)
AST: 43 U/L — ABNORMAL HIGH (ref 10–35)
Albumin: 3.8 g/dL (ref 3.6–5.1)
Alkaline phosphatase (APISO): 70 U/L (ref 35–144)
Bilirubin, Direct: 0.4 mg/dL — ABNORMAL HIGH (ref 0.0–0.2)
Globulin: 3.3 g/dL (calc) (ref 1.9–3.7)
Indirect Bilirubin: 1.5 mg/dL (calc) — ABNORMAL HIGH (ref 0.2–1.2)
Total Bilirubin: 1.9 mg/dL — ABNORMAL HIGH (ref 0.2–1.2)
Total Protein: 7.1 g/dL (ref 6.1–8.1)

## 2022-02-13 LAB — HEPATITIS B SURFACE ANTIBODY, QUANTITATIVE: Hep B S AB Quant (Post): >1000 m[IU]/mL (ref >=10)

## 2022-02-13 LAB — HEPATITIS B E ANTIBODY: Hep B E Ab: REACTIVE — AB

## 2022-02-13 LAB — PROTIME-INR
INR: 1.3 — ABNORMAL HIGH
Prothrombin Time: 13.8 s — ABNORMAL HIGH (ref 9.0–11.5)

## 2022-02-13 LAB — HEPATITIS B SURFACE ANTIGEN: Hepatitis B Surface Ag: REACTIVE — AB

## 2022-02-13 LAB — AFP TUMOR MARKER: AFP-Tumor Marker: 4.5 ng/mL (ref <6.1)

## 2022-02-13 NOTE — Telephone Encounter (Signed)
Received notification via fax from [place] that claim for Vemlidy 27m tablets has been rejected and requires prior authorization. Per pharmacy team patient currently receiving medication from [facility] and not PA is required at this time.   SEugenia Mcalpine

## 2022-02-22 NOTE — Telephone Encounter (Signed)
Rcvd PA request 02/20/22- Faxed back notifying [facility] that patient rcvs meds through program [facility]. No Auth needed.

## 2022-02-25 ENCOUNTER — Other Ambulatory Visit: Payer: Self-pay

## 2022-02-25 ENCOUNTER — Telehealth: Payer: Self-pay

## 2022-02-25 DIAGNOSIS — B181 Chronic viral hepatitis B without delta-agent: Secondary | ICD-10-CM

## 2022-02-25 MED ORDER — VEMLIDY 25 MG PO TABS: 25.0000 mg | ORAL_TABLET | Freq: Every day | ORAL | 5 refills | Status: AC

## 2022-02-25 NOTE — Telephone Encounter (Signed)
Patient's daughter called requesting refills on Vemlidy. States that pharmacy told them that they did not have prescription on file. Patient denies missing any doses since last visit. Refills sent today. Educated patient and his daughter on importance of taking medication every day as prescribed.  Will call if she has any issues filling medication. Leatrice Jewels, RMA

## 2022-03-05 NOTE — Telephone Encounter (Signed)
I spoke with [facility] yet again and told them we was not going to do a PA for [facility].

## 2022-03-05 NOTE — Telephone Encounter (Signed)
Received call from [facility] stating PA was required for [facility]. Advised them that patient gets meds through support [facility] and no PA required, they have additional questions. Transferred call to pharmacy line for them to leave message for Butch Penny.   Beryle Flock, RN

## 2022-03-18 ENCOUNTER — Telehealth: Payer: Self-pay

## 2022-03-18 NOTE — Telephone Encounter (Signed)
Jose Snow spoke to patient. His daughter called and stated. She still had not received medication. I called [facility]'s number [phone])  and was able to find out it was approved on 06/07 and they've been filing up until 09/08 (last one was shipped) they have 8 fills remaining. I tried to set up delivery but she said patient has to do it. They need to call [phone] option 3.

## 2022-03-18 NOTE — Telephone Encounter (Signed)
Relayed message to patient's daughter Hinda Kehr [phone]) to call and get medication shipped with number provided below and to call back if she has any issues.

## 2022-04-10 ENCOUNTER — Ambulatory Visit (INDEPENDENT_AMBULATORY_CARE_PROVIDER_SITE_OTHER): Payer: Managed Care, Other (non HMO) | Admitting: Family

## 2022-04-10 ENCOUNTER — Other Ambulatory Visit: Payer: Self-pay

## 2022-04-10 ENCOUNTER — Other Ambulatory Visit (HOSPITAL_COMMUNITY): Payer: Self-pay

## 2022-04-10 ENCOUNTER — Encounter: Payer: Self-pay | Admitting: Family

## 2022-04-10 VITALS — BP 118/65 | HR 57 | Temp 98.4°F | Wt 126.0 lb

## 2022-04-10 DIAGNOSIS — B1910 Unspecified viral hepatitis B without hepatic coma: Secondary | ICD-10-CM

## 2022-04-10 DIAGNOSIS — B181 Chronic viral hepatitis B without delta-agent: Secondary | ICD-10-CM | POA: Diagnosis not present

## 2022-04-10 DIAGNOSIS — K7460 Unspecified cirrhosis of liver: Secondary | ICD-10-CM | POA: Diagnosis not present

## 2022-04-10 NOTE — Assessment & Plan Note (Signed)
Jose Snow has been doing okay and fortunately has not had any signs/symptoms of Hepatitis B flare despite not being on medication secondary to insurance. Viread will require prior authorization which is now in process. Samples of Descovy provided for 2 weeks while awaiting medication approval. Once approved will change to Viread for continued treatment. Reminded of importance of not stopping medication abruptly to avoid possibility of Hepatitis B flare. Continue to follow up in 5 months or sooner if needed.

## 2022-04-10 NOTE — Patient Instructions (Addendum)
Nice to see you.  Continue to take your medication daily as prescribed.  Plan for follow up in 5 months or sooner if needed with lab work 1-2 weeks prior to appointment.   Have a great day and stay safe!

## 2022-04-10 NOTE — Progress Notes (Signed)
Subjective:    Patient ID: Jose Snow, male    DOB: [birth year], 52 y.o.   MRN: [id number]  Chief Complaint  Patient presents with   Follow-up    Hep B     HPI:  Jose Snow is a 52 y.o. male with chronic hepatitis B complicated with cirrhosis last seen on 02/11/22 for routine follow up with good adherence and tolerance to [facility]. Hepatitis B DNA level was 34. Plan was to continue Vemlidy until current assistance ran out and switch to Viread with new insurance. Jose Snow primary preferred language is [place] and a medical interpreter is present to aid in communication.   Jose Snow has been doing well since his last office. Due to changes in insurance unfortunately has been off medication for about 1 month now. Denies abdominal pain, nausea, vomiting, fatigue, fever, scleral icterus or jaundice.    Allergies  Allergen Reactions   Other     Yellow cough medicine makes him dizzy      Outpatient Medications Prior to Visit  Medication Sig Dispense Refill   B-D UF III MINI PEN NEEDLES 31G X 5 MM MISC daily. use as directed     insulin glargine (LANTUS) 100 UNIT/ML injection Inject 6 Units into the skin daily.      Lancets (ONETOUCH DELICA PLUS [id number]) MISC Apply topically.     nadolol (CORGARD) 20 MG tablet Take 1 tablet (20 mg total) by mouth daily. 90 tablet 2   omeprazole (PRILOSEC) 40 MG capsule TAKE 1 CAPSULE(40 MG) BY MOUTH DAILY BEFORE AND BREAKFAST 90 capsule 1   umeclidinium-vilanterol (ANORO ELLIPTA) 62.5-25 MCG/ACT AEPB Inhale 1 puff into the lungs daily at 6 (six) AM. 1 each 7   albuterol (VENTOLIN HFA) 108 (90 Base) MCG/ACT inhaler Inhale 1-2 puffs into the lungs every 4 (four) hours as needed for wheezing or shortness of breath. 1 each 6   metFORMIN (GLUCOPHAGE) 500 MG tablet Take 500 mg by mouth daily. (Patient not taking: Reported on 04/10/2022)     No facility-administered medications prior to visit.     Past Medical History:  Diagnosis Date   Cirrhosis  ([place])    Diabetes mellitus ([place])    Hepatitis B    Liver cirrhosis secondary to NASH ([place])    Lymphocytic colitis    Pancytopenia ([place])    TB (tuberculosis)    treated 15 months at health dept.      Past Surgical History:  Procedure Laterality Date   BIOPSY  10/23/2017   Procedure: BIOPSY;  Surgeon: Milus Banister, MD;  [facility];  Service: Endoscopy;;   BIOPSY  03/03/2018   Procedure: BIOPSY;  Surgeon: Irving Copas., MD;  Location: [place];  Service: Gastroenterology;;   COLONOSCOPY WITH PROPOFOL N/A 10/23/2017   Procedure: COLONOSCOPY WITH PROPOFOL;  Surgeon: Milus Banister, MD;  [facility];  Service: Endoscopy;  Laterality: N/A;   COMPLEX WOUND CLOSURE Right 12/15/2015   Procedure: COMPLEX WOUND CLOSURE;  Surgeon: Leanora Cover, MD;  Location: [place];  Service: Orthopedics;  Laterality: Right;   ESOPHAGOGASTRODUODENOSCOPY (EGD) WITH PROPOFOL N/A 10/23/2017   Procedure: ESOPHAGOGASTRODUODENOSCOPY (EGD) WITH PROPOFOL;  Surgeon: Milus Banister, MD;  [facility];  Service: Endoscopy;  Laterality: N/A;   ESOPHAGOGASTRODUODENOSCOPY (EGD) WITH PROPOFOL N/A 03/03/2018   Procedure: ESOPHAGOGASTRODUODENOSCOPY (EGD) WITH PROPOFOL;  Surgeon: Rush Landmark Telford Nab., MD;  Location: [place];  Service: Gastroenterology;  Laterality: N/A;   I & D EXTREMITY Right 12/15/2015   Procedure: IRRIGATION AND  DEBRIDEMENT AND REVISION AMPUTATION RIGHT RING FINGER;  Surgeon: Leanora Cover, MD;  Location: [place];  Service: Orthopedics;  Laterality: Right;       Review of Systems    Objective:    BP 118/65   Pulse (!) 57   Temp 98.4 F (36.9 C) (Oral)   Wt 126 lb (57.2 kg)   SpO2 98%   BMI 22.32 kg/m  Nursing note and vital signs reviewed.  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 ([place]) 11/25/2014    Priority: High   Hepatoma ([place]) 01/31/2022   Moderate COPD (chronic obstructive pulmonary disease) ([place]) 01/31/2022   Liver cirrhosis secondary to NASH ([place])    Chronic cough 12/08/2019   Pancytopenia ([place]) 12/03/2017   Abnormal echocardiogram 12/03/2017   History of hepatitis B 12/03/2017   Type 2 diabetes mellitus with other specified complication, without long-term current use of insulin (HCC)    Esophageal varices without bleeding ([place])    Heart murmur 08/01/2017   Thrombocytopenia ([place]) 11/25/2014   History of TB (tuberculosis) 07/16/2010     Problem List Items Addressed This Visit       Digestive   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 Viread will require prior authorization which is now in process. Samples of Descovy provided for 2 weeks while awaiting medication approval. Once approved will change to Viread for continued treatment. Reminded of importance of not stopping medication abruptly to avoid possibility of Hepatitis B flare. Continue to follow up in 5 months or sooner if needed.         I am having Jose Snow maintain his insulin glargine, metFORMIN, B-D UF III MINI PEN NEEDLES, OneTouch Delica Plus [id number], omeprazole, nadolol, umeclidinium-vilanterol, and albuterol.   No orders of the defined types were placed in this encounter.    Follow-up: Return in about 5 months (around 09/09/2022), or if symptoms worsen or fail to improve.   Terri Piedra, MSN, FNP-C Nurse Practitioner [facility] for Infectious Disease [place] number: [phone]

## 2022-04-11 ENCOUNTER — Other Ambulatory Visit (HOSPITAL_COMMUNITY): Payer: Self-pay

## 2022-04-11 ENCOUNTER — Other Ambulatory Visit: Payer: Self-pay | Admitting: Pharmacist

## 2022-04-11 DIAGNOSIS — B181 Chronic viral hepatitis B without delta-agent: Secondary | ICD-10-CM

## 2022-04-11 MED ORDER — TENOFOVIR DISOPROXIL FUMARATE 300 MG PO TABS
300.0000 mg | ORAL_TABLET | Freq: Every day | ORAL | 6 refills | Status: DC
  Filled 2022-04-11 – 2022-04-18 (×2): qty 30, 30d supply, fill #0
  Filled 2022-05-07: qty 30, 30d supply, fill #1
  Filled 2022-06-04: qty 30, 30d supply, fill #2
  Filled 2022-07-09: qty 30, 30d supply, fill #3
  Filled 2022-10-03: qty 30, 30d supply, fill #4

## 2022-04-11 MED ORDER — DESCOVY 200-25 MG PO TABS: 1.0000 | ORAL_TABLET | Freq: Every day | ORAL | 0 refills | Status: AC

## 2022-04-11 NOTE — Progress Notes (Signed)
Medication Samples have been provided to the patient.  Drug name: Descovy        Strength: 200/25 mg       Qty: 2 packs (14 tablets)   LOT: [id number]; [id number]   Exp.Date: 12/2023; 06/2023  Dosing instructions: Take one tablet by mouth once daily  The patient has been instructed regarding the correct time, dose, and frequency of taking this medication, including desired effects and most common side effects.   Iantha Titsworth L. Eber Hong, PharmD, BCIDP, AAHIVP, CPP Clinical Pharmacist Practitioner Infectious Diseases [place] for Infect[facility] 03/20/2020, 10:07 AM

## 2022-04-11 NOTE — Addendum Note (Signed)
Addended by: Mauricio Po D on: 04/11/2022 09:15 AM   Modules accepted: Orders

## 2022-04-18 ENCOUNTER — Other Ambulatory Visit (HOSPITAL_COMMUNITY): Payer: Self-pay

## 2022-04-21 ENCOUNTER — Other Ambulatory Visit: Payer: Self-pay | Admitting: Family

## 2022-04-21 DIAGNOSIS — B181 Chronic viral hepatitis B without delta-agent: Secondary | ICD-10-CM

## 2022-05-07 ENCOUNTER — Other Ambulatory Visit (HOSPITAL_COMMUNITY): Payer: Self-pay

## 2022-05-13 ENCOUNTER — Other Ambulatory Visit: Payer: Self-pay

## 2022-05-14 ENCOUNTER — Other Ambulatory Visit (HOSPITAL_COMMUNITY): Payer: Self-pay

## 2022-05-27 ENCOUNTER — Other Ambulatory Visit: Payer: Self-pay | Admitting: Emergency Medicine

## 2022-05-27 DIAGNOSIS — K219 Gastro-esophageal reflux disease without esophagitis: Secondary | ICD-10-CM

## 2022-05-30 ENCOUNTER — Other Ambulatory Visit (HOSPITAL_COMMUNITY): Payer: Self-pay

## 2022-06-04 ENCOUNTER — Other Ambulatory Visit (HOSPITAL_COMMUNITY): Payer: Self-pay

## 2022-06-06 ENCOUNTER — Other Ambulatory Visit (HOSPITAL_COMMUNITY): Payer: Self-pay

## 2022-06-18 ENCOUNTER — Ambulatory Visit: Payer: Commercial Managed Care - HMO | Admitting: Internal Medicine

## 2022-06-27 ENCOUNTER — Other Ambulatory Visit (HOSPITAL_COMMUNITY): Payer: Self-pay

## 2022-07-09 ENCOUNTER — Other Ambulatory Visit (HOSPITAL_COMMUNITY): Payer: Self-pay

## 2022-07-11 ENCOUNTER — Other Ambulatory Visit (HOSPITAL_COMMUNITY): Payer: Self-pay

## 2022-07-20 ENCOUNTER — Other Ambulatory Visit: Payer: Self-pay | Admitting: Internal Medicine

## 2022-08-08 ENCOUNTER — Encounter: Payer: Self-pay | Admitting: Emergency Medicine

## 2022-08-08 ENCOUNTER — Telehealth: Payer: Self-pay

## 2022-08-08 ENCOUNTER — Ambulatory Visit (INDEPENDENT_AMBULATORY_CARE_PROVIDER_SITE_OTHER): Payer: Commercial Managed Care - HMO | Admitting: Emergency Medicine

## 2022-08-08 VITALS — BP 120/72 | HR 65 | Temp 98.6°F | Ht 63.0 in | Wt 119.1 lb

## 2022-08-08 DIAGNOSIS — J449 Chronic obstructive pulmonary disease, unspecified: Secondary | ICD-10-CM

## 2022-08-08 DIAGNOSIS — Z794 Long term (current) use of insulin: Secondary | ICD-10-CM

## 2022-08-08 DIAGNOSIS — E1169 Type 2 diabetes mellitus with other specified complication: Secondary | ICD-10-CM | POA: Diagnosis not present

## 2022-08-08 DIAGNOSIS — K7581 Nonalcoholic steatohepatitis (NASH): Secondary | ICD-10-CM | POA: Diagnosis not present

## 2022-08-08 DIAGNOSIS — I872 Venous insufficiency (chronic) (peripheral): Secondary | ICD-10-CM

## 2022-08-08 DIAGNOSIS — K7460 Unspecified cirrhosis of liver: Secondary | ICD-10-CM

## 2022-08-08 DIAGNOSIS — Z7984 Long term (current) use of oral hypoglycemic drugs: Secondary | ICD-10-CM

## 2022-08-08 DIAGNOSIS — D696 Thrombocytopenia, unspecified: Secondary | ICD-10-CM

## 2022-08-08 LAB — COMPREHENSIVE METABOLIC PANEL
ALT: 39 U/L (ref 0–53)
AST: 46 U/L — ABNORMAL HIGH (ref 0–37)
Albumin: 3.6 g/dL (ref 3.5–5.2)
Alkaline Phosphatase: 75 U/L (ref 39–117)
BUN: 13 mg/dL (ref 6–23)
CO2: 30 mEq/L (ref 19–32)
Calcium: 8.8 mg/dL (ref 8.4–10.5)
Chloride: 103 mEq/L (ref 96–112)
Creatinine, Ser: 1.33 mg/dL (ref 0.40–1.50)
GFR: 60.67 mL/min (ref >60.00)
Glucose, Bld: 170 mg/dL — ABNORMAL HIGH (ref 70–99)
Potassium: 4.3 mEq/L (ref 3.5–5.1)
Sodium: 137 mEq/L (ref 135–145)
Total Bilirubin: 1.5 mg/dL — ABNORMAL HIGH (ref 0.2–1.2)
Total Protein: 7.1 g/dL (ref 6.0–8.3)

## 2022-08-08 LAB — MICROALBUMIN / CREATININE URINE RATIO
Creatinine,U: 69.5 mg/dL
Microalb Creat Ratio: 4.5 mg/g (ref 0.0–30.0)
Microalb, Ur: 3.1 mg/dL — ABNORMAL HIGH (ref 0.0–1.9)

## 2022-08-08 LAB — CBC WITH DIFFERENTIAL/PLATELET
Basophils Absolute: 0 10*3/uL (ref 0.0–0.1)
Basophils Relative: 0.7 % (ref 0.0–3.0)
Eosinophils Absolute: 0.2 10*3/uL (ref 0.0–0.7)
Eosinophils Relative: 7.6 % — ABNORMAL HIGH (ref 0.0–5.0)
HCT: 37.1 % — ABNORMAL LOW (ref 39.0–52.0)
Hemoglobin: 12.6 g/dL — ABNORMAL LOW (ref 13.0–17.0)
Lymphocytes Relative: 37.4 % (ref 12.0–46.0)
Lymphs Abs: 1 10*3/uL (ref 0.7–4.0)
MCHC: 34 g/dL (ref 30.0–36.0)
MCV: 90.4 fl (ref 78.0–100.0)
Monocytes Absolute: 0.2 10*3/uL (ref 0.1–1.0)
Monocytes Relative: 8.3 % (ref 3.0–12.0)
Neutro Abs: 1.2 10*3/uL — ABNORMAL LOW (ref 1.4–7.7)
Neutrophils Relative %: 46 % (ref 43.0–77.0)
Platelets: 32 10*3/uL — CL (ref 150.0–400.0)
RBC: 4.11 Mil/uL — ABNORMAL LOW (ref 4.22–5.81)
RDW: 15.3 % (ref 11.5–15.5)
WBC: 2.6 10*3/uL — ABNORMAL LOW (ref 4.0–10.5)

## 2022-08-08 LAB — POCT GLYCOSYLATED HEMOGLOBIN (HGB A1C): Hemoglobin A1C: 5.9 % — AB (ref 4.0–5.6)

## 2022-08-08 LAB — LIPID PANEL
Cholesterol: 109 mg/dL (ref 0–200)
HDL: 40.6 mg/dL (ref >39.00)
LDL Cholesterol: 59 mg/dL (ref 0–99)
NonHDL: 68.41
Total CHOL/HDL Ratio: 3
Triglycerides: 48 mg/dL (ref 0.0–149.0)
VLDL: 9.6 mg/dL (ref 0.0–40.0)

## 2022-08-08 MED ORDER — METFORMIN HCL 500 MG PO TABS: 500.0000 mg | ORAL_TABLET | Freq: Every day | ORAL | 3 refills | Status: DC

## 2022-08-08 NOTE — Assessment & Plan Note (Signed)
Stable.  No concerns. Continue Anoro Ellipta 1 puff daily. 

## 2022-08-08 NOTE — Progress Notes (Signed)
Jose Snow 52 y.o.   Chief Complaint  Patient presents with   Medical Management of Chronic Issues    F/u appt DM, no concerns     HISTORY OF PRESENT ILLNESS: This is a 52 y.o. male here for follow-up of multiple chronic medical conditions including diabetes and liver cirrhosis Overall doing well.  Accompanied by translator. Has no pains or any other medical concerns today.  HPI   Prior to Admission medications   Medication Sig Start Date End Date Taking? Authorizing Provider  B-D UF III MINI PEN NEEDLES 31G X 5 MM MISC daily. use as directed 06/21/19  Yes [provider]  insulin glargine (LANTUS) 100 UNIT/ML injection Inject 6 Units into the skin daily.    Yes [provider]  Lancets (ONETOUCH DELICA PLUS LANCET33G) MISC Apply topically. 09/06/20  Yes [provider]  metFORMIN (GLUCOPHAGE) 500 MG tablet Take 500 mg by mouth daily. 05/07/19  Yes [provider]  nadolol (CORGARD) 20 MG tablet TAKE 1 TABLET(20 MG) BY MOUTH DAILY 07/22/22  Yes Iva Boop, MD  omeprazole (PRILOSEC) 40 MG capsule TAKE 1 CAPSULE BY MOUTH DAILY BEFORE BREAKFAST 05/27/22  Yes Jaimin Krupka, Eilleen Kempf, MD  tenofovir (VIREAD) 300 MG tablet Take 1 tablet (300 mg total) by mouth daily. 04/11/22  Yes Veryl Speak, FNP  umeclidinium-vilanterol (ANORO ELLIPTA) 62.5-25 MCG/ACT AEPB Inhale 1 puff into the lungs daily at 6 (six) AM. 01/31/22  Yes Atianna Haidar, Eilleen Kempf, MD  albuterol (VENTOLIN HFA) 108 (90 Base) MCG/ACT inhaler Inhale 1-2 puffs into the lungs every 4 (four) hours as needed for wheezing or shortness of breath. 01/31/22 03/02/22  Georgina Quint, MD    Allergies  Allergen Reactions   Other     Yellow cough medicine makes him dizzy    Patient Active Problem List   Diagnosis Date Noted   Hepatoma (HCC) 01/31/2022   Moderate COPD (chronic obstructive pulmonary disease) (HCC) 01/31/2022   Liver cirrhosis secondary to NASH (HCC)    Chronic cough  12/08/2019   Pancytopenia (HCC) 12/03/2017   Abnormal echocardiogram 12/03/2017   History of hepatitis B 12/03/2017   Type 2 diabetes mellitus with other specified complication, without long-term current use of insulin (HCC)    Esophageal varices without bleeding (HCC)    Heart murmur 08/01/2017   Hepatitis B 02/17/2015   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) 11/25/2014   Thrombocytopenia (HCC) 11/25/2014   History of TB (tuberculosis) 07/16/2010    Past Medical History:  Diagnosis Date   Cirrhosis (HCC)    Diabetes mellitus (HCC)    Hepatitis B    Liver cirrhosis secondary to NASH (HCC)    Lymphocytic colitis    Pancytopenia (HCC)    TB (tuberculosis)    treated 15 months at health dept[facility]     Past Surgical History:  Procedure Laterality Date   BIOPSY  10/23/2017   Procedure: BIOPSY;  Surgeon: Rachael Fee, MD;  Location: [facility] ENDOSCOPY;  Service: Endoscopy;;   BIOPSY  03/03/2018   Procedure: BIOPSY;  Surgeon: Lemar Lofty., MD;  Location: [facility] ENDOSCOPY;  Service: Gastroenterology;;   COLONOSCOPY WITH PROPOFOL N/A 10/23/2017   Procedure: COLONOSCOPY WITH PROPOFOL;  Surgeon: Rachael Fee, MD;  Location: [facility] ENDOSCOPY;  Service: Endoscopy;  Laterality: N/A;   COMPLEX WOUND CLOSURE Right 12/15/2015   Procedure: COMPLEX WOUND CLOSURE;  Surgeon: Betha Loa, MD;  Location: [facility] OR;  Service: Orthopedics;  Laterality: Right;   ESOPHAGOGASTRODUODENOSCOPY (EGD) WITH PROPOFOL  N/A 10/23/2017   Procedure: ESOPHAGOGASTRODUODENOSCOPY (EGD) WITH PROPOFOL;  Surgeon: Rachael Fee, MD;  Location: [facility] ENDOSCOPY;  Service: Endoscopy;  Laterality: N/A;   ESOPHAGOGASTRODUODENOSCOPY (EGD) WITH PROPOFOL N/A 03/03/2018   Procedure: ESOPHAGOGASTRODUODENOSCOPY (EGD) WITH PROPOFOL;  Surgeon: Meridee Score Netty Starring., MD;  Location: [facility] ENDOSCOPY;  Service: Gastroenterology;  Laterality: N/A;   I & D EXTREMITY Right 12/15/2015   Procedure: IRRIGATION AND DEBRIDEMENT AND REVISION AMPUTATION  RIGHT RING FINGER;  Surgeon: Betha Loa, MD;  Location: [facility] OR;  Service: Orthopedics;  Laterality: Right;    Social History   Socioeconomic History   Marital status: Married    Spouse name: Not on file   Number of children: 4   Years of education: Not on file   Highest education level: Not on file  Occupational History   Occupation: [employer]    Comment: Builds chair parts  Tobacco Use   Smoking status: Former    Packs/day: 0.50    Years: 8.00    Additional pack years: 0.00    Total pack years: 4.00    Types: Cigarettes    Quit date: 04/08/1990    Years since quitting: 32.3   Smokeless tobacco: Never  Vaping Use   Vaping Use: Never used  Substance and Sexual Activity   Alcohol use: No   Drug use: No   Sexual activity: Yes  Other Topics Concern   Not on file  Social History Narrative   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  Lives with wife and four children.    Social Determinants of Health   Financial Resource Strain: Not on file  Food Insecurity: Not on file  Transportation Needs: Not on file  Physical Activity: Not on file  Stress: Not on file  Social Connections: Not on file  Intimate Partner Violence: Not on file    Family History  Problem Relation Age of Onset   Colon cancer Neg Hx    Esophageal cancer Neg Hx    Pancreatic cancer Neg Hx    Stomach cancer Neg Hx    Liver disease Neg Hx      Review of Systems  Constitutional: Negative.  Negative for chills and fever.  HENT: Negative.  Negative for congestion and sore throat.   Respiratory: Negative.  Negative for cough and shortness of breath.   Cardiovascular: Negative.  Negative for chest pain and palpitations.  Gastrointestinal:  Negative for abdominal pain, diarrhea, nausea and vomiting.  Genitourinary: Negative.  Negative for dysuria and hematuria.  Skin: Negative.  Negative for rash.  Neurological: Negative.  Negative for dizziness and headaches.  All other systems reviewed and are negative.   Today's  Vitals   08/08/22 1409  BP: 120/72  Pulse: 65  Temp: 98.6 F (37 C)  TempSrc: Oral  SpO2: 98%  Weight: 119 lb 2 oz (54 kg)  Height: 5\' 3"  (1.6 m)   Body mass index is 21.1 kg/m.   Physical Exam Vitals reviewed.  Constitutional:      Appearance: Normal appearance.  HENT:     Head: Normocephalic.     Mouth/Throat:     Mouth: Mucous membranes are moist.     Pharynx: Oropharynx is clear.  Eyes:     Extraocular Movements: Extraocular movements intact.     Pupils: Pupils are equal, round, and reactive to light.  Cardiovascular:     Rate and Rhythm: Normal rate and regular rhythm.     Pulses: Normal pulses.     Heart sounds: Normal heart sounds.  Pulmonary:     Effort: Pulmonary effort is normal.     Breath sounds: Normal breath sounds.  Abdominal:     Palpations: Abdomen is soft.     Tenderness: There is no abdominal tenderness.  Musculoskeletal:     Cervical back: No tenderness.  Lymphadenopathy:     Cervical: No cervical adenopathy.  Skin:    General: Skin is warm and dry.     Capillary Refill: Capillary refill takes less than 2 seconds.     Comments: Lower extremities: Varicose veins and stasis dermatitis skin changes  Neurological:     General: No focal deficit present.     Mental Status: He is alert and oriented to person, place, and time.  Psychiatric:        Mood and Affect: Mood normal.        Behavior: Behavior normal.      ASSESSMENT & PLAN: A total of 44 minutes was spent with the patient and counseling/coordination of care regarding preparing for this visit, review of most recent office visit notes, review of multiple chronic medical problems under management, review of all medications, education on nutrition, prognosis, documentation, and need for follow-up.  Problem List Items Addressed This Visit       Cardiovascular and Mediastinum   Chronic venous insufficiency    Stable.  No signs of infection. Advised to use compression stocks with leg  elevation when possible        Respiratory   Moderate COPD (chronic obstructive pulmonary disease) (HCC)    Stable.  No concerns. Continue Anoro Ellipta 1 puff daily        Digestive   Liver cirrhosis secondary to NASH (HCC) - Primary    Stable.  No complications. No signs of infection.  No signs of hepatic encephalopathy. No ascites. Continue nadolol 20 mg daily        Endocrine   Type 2 diabetes mellitus with other specified complication, without long-term current use of insulin (HCC)    Well-controlled diabetes with hemoglobin A1c at 5.9 Lab Results  Component Value Date   HGBA1C 5.9 (A) 08/08/2022  Continue daily insulin glargine 6 units and metformin 500 mg daily Diet and nutrition discussed       Relevant Medications   metFORMIN (GLUCOPHAGE) 500 MG tablet   Other Relevant Orders   POCT HgB A1C (Completed)   CBC with Differential/Platelet   Comprehensive metabolic panel   Lipid panel   Microalbumin / creatinine urine ratio     Hematopoietic and Hemostatic   Thrombocytopenia (HCC)    Stable.  No clinical bleeding.      Patient Instructions  Health Maintenance, Male Adopting a healthy lifestyle and getting preventive care are important in promoting health and wellness. Ask your health care provider about: The right schedule for you to have regular tests and exams. Things you can do on your own to prevent diseases and keep yourself healthy. What should I know about diet, weight, and exercise? Eat a healthy diet  Eat a diet that includes plenty of vegetables, fruits, low-fat dairy products, and lean protein. Do not eat a lot of foods that are high in solid fats, added sugars, or sodium. Maintain a healthy weight Body mass index (BMI) is a measurement that can be used to identify possible weight problems. It estimates body fat based on height and weight. Your health care provider can help determine your BMI and help you achieve or maintain a healthy  weight. Get regular exercise Get  regular exercise. This is one of the most important things you can do for your health. Most adults should: Exercise for at least 150 minutes each week. The exercise should increase your heart rate and make you sweat (moderate-intensity exercise). Do strengthening exercises at least twice a week. This is in addition to the moderate-intensity exercise. Spend less time sitting. Even light physical activity can be beneficial. Watch cholesterol and blood lipids Have your blood tested for lipids and cholesterol at 54 years of age, then have this test every 5 years. You may need to have your cholesterol levels checked more often if: Your lipid or cholesterol levels are high. You are older than 54 years of age. You are at high risk for heart disease. What should I know about cancer screening? Many types of cancers can be detected early and may often be prevented. Depending on your health history and family history, you may need to have cancer screening at various ages. This may include screening for: Colorectal cancer. Prostate cancer. Skin cancer. Lung cancer. What should I know about heart disease, diabetes, and high blood pressure? Blood pressure and heart disease High blood pressure causes heart disease and increases the risk of stroke. This is more likely to develop in people who have high blood pressure readings or are overweight. Talk with your health care provider about your target blood pressure readings. Have your blood pressure checked: Every 3-5 years if you are 24-50 years of age. Every year if you are 75 years old or older. If you are between the ages of 11 and 33 and are a current or former smoker, ask your health care provider if you should have a one-time screening for abdominal aortic aneurysm (AAA). Diabetes Have regular diabetes screenings. This checks your fasting blood sugar level. Have the screening done: Once every three years after age 22  if you are at a normal weight and have a low risk for diabetes. More often and at a younger age if you are overweight or have a high risk for diabetes. What should I know about preventing infection? Hepatitis B If you have a higher risk for hepatitis B, you should be screened for this virus. Talk with your health care provider to find out if you are at risk for hepatitis B infection. Hepatitis C Blood testing is recommended for: Everyone born from [birth year] through 1965. Anyone with known risk factors for hepatitis C. Sexually transmitted infections (STIs) You should be screened each year for STIs, including gonorrhea and chlamydia, if: You are sexually active and are younger than 54 years of age. You are older than 54 years of age and your health care provider tells you that you are at risk for this type of infection. Your sexual activity has changed since you were last screened, and you are at increased risk for chlamydia or gonorrhea. Ask your health care provider if you are at risk. Ask your health care provider about whether you are at high risk for HIV. Your health care provider may recommend a prescription medicine to help prevent HIV infection. If you choose to take medicine to prevent HIV, you should first get tested for HIV. You should then be tested every 3 months for as long as you are taking the medicine. Follow these instructions at home: Alcohol use Do not drink alcohol if your health care provider tells you not to drink. If you drink alcohol: Limit how much you have to 0-2 drinks a day. Know how much alcohol is  in your drink. In the [place] one drink equals one 12 oz bottle of beer (355 mL), one 5 oz glass of wine (148 mL), or one 1 oz glass of hard liquor (44 mL). Lifestyle Do not use any products that contain nicotine or tobacco. These products include cigarettes, chewing tobacco, and vaping devices, such as e-cigarettes. If you need help quitting, ask your health care provider. Do  not use street drugs. Do not share needles. Ask your health care provider for help if you need support or information about quitting drugs. General instructions Schedule regular health, dental, and eye exams. Stay current with your vaccines. Tell your health care provider if: You often feel depressed. You have ever been abused or do not feel safe at home. Summary Adopting a healthy lifestyle and getting preventive care are important in promoting health and wellness. Follow your health care provider's instructions about healthy diet, exercising, and getting tested or screened for diseases. Follow your health care provider's instructions on monitoring your cholesterol and blood pressure. This information is not intended to replace advice given to you by your health care provider. Make sure you discuss any questions you have with your health care provider. Document Revised: 08/14/2020 Document Reviewed: 08/14/2020 Elsevier Patient Education  2023 Elsevier Inc.     Edwina Barth, MD [facility]

## 2022-08-08 NOTE — Telephone Encounter (Signed)
Thank you :)

## 2022-08-08 NOTE — Assessment & Plan Note (Signed)
Stable.  No signs of infection. Advised to use compression stocks with leg elevation when possible

## 2022-08-08 NOTE — Assessment & Plan Note (Signed)
Stable.  No complications. No signs of infection.  No signs of hepatic encephalopathy. No ascites. Continue nadolol 20 mg daily

## 2022-08-08 NOTE — Patient Instructions (Signed)
Health Maintenance, Male Adopting a healthy lifestyle and getting preventive care are important in promoting health and wellness. Ask your health care provider about: The right schedule for you to have regular tests and exams. Things you can do on your own to prevent diseases and keep yourself healthy. What should I know about diet, weight, and exercise? Eat a healthy diet  Eat a diet that includes plenty of vegetables, fruits, low-fat dairy products, and lean protein. Do not eat a lot of foods that are high in solid fats, added sugars, or sodium. Maintain a healthy weight Body mass index (BMI) is a measurement that can be used to identify possible weight problems. It estimates body fat based on height and weight. Your health care provider can help determine your BMI and help you achieve or maintain a healthy weight. Get regular exercise Get regular exercise. This is one of the most important things you can do for your health. Most adults should: Exercise for at least 150 minutes each week. The exercise should increase your heart rate and make you sweat (moderate-intensity exercise). Do strengthening exercises at least twice a week. This is in addition to the moderate-intensity exercise. Spend less time sitting. Even light physical activity can be beneficial. Watch cholesterol and blood lipids Have your blood tested for lipids and cholesterol at 54 years of age, then have this test every 5 years. You may need to have your cholesterol levels checked more often if: Your lipid or cholesterol levels are high. You are older than 54 years of age. You are at high risk for heart disease. What should I know about cancer screening? Many types of cancers can be detected early and may often be prevented. Depending on your health history and family history, you may need to have cancer screening at various ages. This may include screening for: Colorectal cancer. Prostate cancer. Skin cancer. Lung  cancer. What should I know about heart disease, diabetes, and high blood pressure? Blood pressure and heart disease High blood pressure causes heart disease and increases the risk of stroke. This is more likely to develop in people who have high blood pressure readings or are overweight. Talk with your health care provider about your target blood pressure readings. Have your blood pressure checked: Every 3-5 years if you are 18-39 years of age. Every year if you are 40 years old or older. If you are between the ages of 65 and 75 and are a current or former smoker, ask your health care provider if you should have a one-time screening for abdominal aortic aneurysm (AAA). Diabetes Have regular diabetes screenings. This checks your fasting blood sugar level. Have the screening done: Once every three years after age 45 if you are at a normal weight and have a low risk for diabetes. More often and at a younger age if you are overweight or have a high risk for diabetes. What should I know about preventing infection? Hepatitis B If you have a higher risk for hepatitis B, you should be screened for this virus. Talk with your health care provider to find out if you are at risk for hepatitis B infection. Hepatitis C Blood testing is recommended for: Everyone born from 1945 through 1965. Anyone with known risk factors for hepatitis C. Sexually transmitted infections (STIs) You should be screened each year for STIs, including gonorrhea and chlamydia, if: You are sexually active and are younger than 54 years of age. You are older than 54 years of age and your   health care provider tells you that you are at risk for this type of infection. Your sexual activity has changed since you were last screened, and you are at increased risk for chlamydia or gonorrhea. Ask your health care provider if you are at risk. Ask your health care provider about whether you are at high risk for HIV. Your health care provider  may recommend a prescription medicine to help prevent HIV infection. If you choose to take medicine to prevent HIV, you should first get tested for HIV. You should then be tested every 3 months for as long as you are taking the medicine. Follow these instructions at home: Alcohol use Do not drink alcohol if your health care provider tells you not to drink. If you drink alcohol: Limit how much you have to 0-2 drinks a day. Know how much alcohol is in your drink. In the [place] one drink equals one 12 oz bottle of beer (355 mL), one 5 oz glass of wine (148 mL), or one 1 oz glass of hard liquor (44 mL). Lifestyle Do not use any products that contain nicotine or tobacco. These products include cigarettes, chewing tobacco, and vaping devices, such as e-cigarettes. If you need help quitting, ask your health care provider. Do not use street drugs. Do not share needles. Ask your health care provider for help if you need support or information about quitting drugs. General instructions Schedule regular health, dental, and eye exams. Stay current with your vaccines. Tell your health care provider if: You often feel depressed. You have ever been abused or do not feel safe at home. Summary Adopting a healthy lifestyle and getting preventive care are important in promoting health and wellness. Follow your health care provider's instructions about healthy diet, exercising, and getting tested or screened for diseases. Follow your health care provider's instructions on monitoring your cholesterol and blood pressure. This information is not intended to replace advice given to you by your health care provider. Make sure you discuss any questions you have with your health care provider. Document Revised: 08/14/2020 Document Reviewed: 08/14/2020 Elsevier Patient Education  2023 Elsevier Inc.  

## 2022-08-08 NOTE — Assessment & Plan Note (Signed)
Well-controlled diabetes with hemoglobin A1c at 5.9 Lab Results  Component Value Date   HGBA1C 5.9 (A) 08/08/2022  Continue daily insulin glargine 6 units and metformin 500 mg daily Diet and nutrition discussed

## 2022-08-08 NOTE — Assessment & Plan Note (Signed)
Stable.  No clinical bleeding.

## 2022-08-12 ENCOUNTER — Other Ambulatory Visit (HOSPITAL_COMMUNITY): Payer: Self-pay

## 2022-08-13 ENCOUNTER — Other Ambulatory Visit (INDEPENDENT_AMBULATORY_CARE_PROVIDER_SITE_OTHER): Payer: Managed Care, Other (non HMO)

## 2022-08-13 ENCOUNTER — Encounter: Payer: Self-pay | Admitting: Internal Medicine

## 2022-08-13 ENCOUNTER — Telehealth: Payer: Self-pay | Admitting: Internal Medicine

## 2022-08-13 ENCOUNTER — Ambulatory Visit (INDEPENDENT_AMBULATORY_CARE_PROVIDER_SITE_OTHER): Payer: Managed Care, Other (non HMO) | Admitting: Internal Medicine

## 2022-08-13 VITALS — BP 120/66 | HR 63 | Ht 63.0 in | Wt 122.4 lb

## 2022-08-13 DIAGNOSIS — I8510 Secondary esophageal varices without bleeding: Secondary | ICD-10-CM

## 2022-08-13 DIAGNOSIS — D61818 Other pancytopenia: Secondary | ICD-10-CM | POA: Diagnosis not present

## 2022-08-13 DIAGNOSIS — K7460 Unspecified cirrhosis of liver: Secondary | ICD-10-CM | POA: Diagnosis not present

## 2022-08-13 DIAGNOSIS — K219 Gastro-esophageal reflux disease without esophagitis: Secondary | ICD-10-CM

## 2022-08-13 DIAGNOSIS — B1910 Unspecified viral hepatitis B without hepatic coma: Secondary | ICD-10-CM

## 2022-08-13 DIAGNOSIS — R161 Splenomegaly, not elsewhere classified: Secondary | ICD-10-CM

## 2022-08-13 LAB — PROTIME-INR
INR: 1.5 ratio — ABNORMAL HIGH (ref 0.8–1.0)
Prothrombin Time: 15.7 s — ABNORMAL HIGH (ref 9.6–13.1)

## 2022-08-13 MED ORDER — NADOLOL 20 MG PO TABS: ORAL_TABLET | ORAL | 3 refills | Status: AC

## 2022-08-13 MED ORDER — OMEPRAZOLE 40 MG PO CPDR: DELAYED_RELEASE_CAPSULE | ORAL | 3 refills | Status: AC

## 2022-08-13 NOTE — Telephone Encounter (Signed)
Patient reports today that his inhaler is too expensive.  He is unable to afford it so he is not currently on it.    umeclidinium-vilanterol (ANORO ELLIPTA) 62.5-25 MCG/ACT AEPB    He was asking for help with this and I told him I would let his PCP know.

## 2022-08-13 NOTE — Progress Notes (Signed)
Jose Snow 52 y.o. 11-Nov-1970 [id number]  Assessment & Plan:   Encounter Diagnoses  Name Primary?   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  I will evaluate as below.  Orders Placed This Encounter  Procedures   US Abdomen Complete   Hepatitis B DNA, ultraquantitative, PCR   Protime-INR   AFP tumor marker   I will message his PCP about his inhaler and the extreme cost and that he needs help with an alternative.  Once I review labs will arrange follow-up but essentially I think follow-up in a year.  Meds ordered this encounter  Medications   omeprazole (PRILOSEC) 40 MG capsule    Sig: TAKE 1 CAPSULE BY MOUTH DAILY BEFORE BREAKFAST    Dispense:  90 capsule    Refill:  3   nadolol (CORGARD) 20 MG tablet    Sig: TAKE 1 TABLET(20 MG) BY MOUTH DAILY    Dispense:  90 tablet    Refill:  3    CC: Sagardia, Eilleen Kempf, MD    Subjective:  Review of pertinent gastrointestinal problems: 1. 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 Merri Ray and eventual care by Dr. Carver Fila ID Thrombocytopenia (Plts around 20K), mild ascites, signs of portal HTN on imaging Labs 2019: Hepatitis B E antibody reactive, hepatitis B core total antibody reactive, hepatitis B surface antigen reactive, hepatitis B E antigen nonreactive (2 years ago this was positive) hepatitis B surface antibody  Nonreactive, hepatitis B DNA 1,200,000 IUs/mL, HIV negative Cirrhosis etiology workup: Labs 2016: Ferritin slightly elevated, HIV negative, hepatitis C virus IgG negative, anti-smooth muscle antibody IgG negative, ANA negative,  HCV Ab negative, ceruloplamin 12.3 (slightly low), A1A normal. Current MELD: 8, labs 11/2020 EGD  10/2017, Dr. Christella Hartigan; small to medium sized esophageal varices, + portal gastropathy. Started nadolol 20mg  daily.  EGD November 2019 while inpatient for drop in hemoglobin and abdominal pain found the same as above as well as multiple erosions.  Biopsies were taken and he was H. pylori negative.  He was put on antibiotics for H. pylori given serologic positive testing (pylera).  Heart rate in 50s to low 60s, no room to increase nadolol further Liver imaging 08/2017 CT without IV contrast: cirrhosis, splenomegaly, mild ascites, signs of portal HTN.  02/2018 CT scan wo IV contrast cirrhosis with no masses.  MRI January 2020 motion degraded, cirrhosis without obvious hepatoma. Ultrasound March 2021 cirrhosis without focal lesions.  Ultrasound 12/2019 cirrhosis without focal liver lesions. [place] 06/2020 cirrhosis without focal lesions. [facility] Liver Transplant evaluation initiated 10/2017 Dr. Merri Ray   2. Lymphocytic colitis: colonoscopy 10/2017 Dr. Christella Hartigan for chronic diarrhea: hemorrhoids, o/w normal. Random biopsies + lymphocytic colitis. Started on budesonide 9mg  daily. Chief Complaint: Hepatitis B cirrhosis  HPI 52 year old 97 Jose Snow here with an interpreter for follow-up of his hepatitis B and cirrhosis as outlined above.  Last seen by Dr. Christella Hartigan March 2023.  He was seen at ID clinic recently and was doing well and had his medications refilled.  He is not having any bowel problems.  He is faithful with his medications.  He does not drink alcohol. Allergies  Allergen Reactions   Other     Yellow cough medicine makes him dizzy  Current Meds  Medication Sig   albuterol (VENTOLIN HFA) 108 (90 Base) MCG/ACT inhaler Inhale 1-2 puffs into the lungs every 4 (four) hours as needed for wheezing or shortness of breath.   B-D UF III MINI PEN NEEDLES 31G X 5 MM MISC daily. use as directed   insulin glargine (LANTUS) 100 UNIT/ML injection Inject 6 Units into the skin daily.    Lancets (ONETOUCH DELICA PLUS LANCET33G)  MISC Apply topically.   metFORMIN (GLUCOPHAGE) 500 MG tablet Take 1 tablet (500 mg total) by mouth daily.   tenofovir (VIREAD) 300 MG tablet Take 1 tablet (300 mg total) by mouth daily.   [DISCONTINUED] nadolol (CORGARD) 20 MG tablet TAKE 1 TABLET(20 MG) BY MOUTH DAILY   [DISCONTINUED] omeprazole (PRILOSEC) 40 MG capsule TAKE 1 CAPSULE BY MOUTH DAILY BEFORE BREAKFAST   Past Medical History:  Diagnosis Date   Cirrhosis (HCC)    Diabetes mellitus (HCC)    Hepatitis B    Liver cirrhosis secondary to NASH (HCC)    Lymphocytic colitis    Pancytopenia (HCC)    TB (tuberculosis)    treated 15 months at health dept.    Past Surgical History:  Procedure Laterality Date   BIOPSY  10/23/2017   Procedure: BIOPSY;  Surgeon: Rachael Fee, MD;  Location: [facility] ENDOSCOPY;  Service: Endoscopy;;   BIOPSY  03/03/2018   Procedure: BIOPSY;  Surgeon: Lemar Lofty., MD;  Location[facility] ENDOSCOPY;  Service: Gastroenterology;;   COLONOSCOPY WITH PROPOFOL N/A 10/23/2017   Procedure: COLONOSCOPY WITH PROPOFOL;  Surgeon: Rachael Fee, MD;  Location: [facility] ENDOSCOPY;  Service: Endoscopy;  Laterality: N/A;   COMPLEX WOUND CLOSURE Right 12/15/2015   Procedure: COMPLEX WOUND CLOSURE;  Surgeon: Betha Loa, MD;  Location: [facility] OR;  Service: Orthopedics;  Laterality: Right;   ESOPHAGOGASTRODUODENOSCOPY (EGD) WITH PROPOFOL N/A 10/23/2017   Procedure: ESOPHAGOGASTRODUODENOSCOPY (EGD) WITH PROPOFOL;  Surgeon: Rachael Fee, MD;  Location: [facility] ENDOSCOPY;  Service: Endoscopy;  Laterality: N/A;   ESOPHAGOGASTRODUODENOSCOPY (EGD) WITH PROPOFOL N/A 03/03/2018   Procedure: ESOPHAGOGASTRODUODENOSCOPY (EGD) WITH PROPOFOL;  Surgeon: Meridee Score Netty Starring., MD;  Location: [facility] ENDOSCOPY;  Service: Gastroenterology;  Laterality: N/A;   I & D EXTREMITY Right 12/15/2015   Procedure: IRRIGATION AND DEBRIDEMENT AND REVISION AMPUTATION RIGHT RING FINGER;  Surgeon: Betha Loa, MD;  Location: [facility] OR;  Service: Orthopedics;  Laterality:  Right;   Social History   Social History Narrative   Work: Statistician paper - factory work    Lives with wife and four children. Oldest born 19 youngest 2008   family history is not on file.   Review of Systems As per HPI  Objective:   Physical Exam @BP  120/66   Pulse 63   Ht 5\' 3"  (1.6 m)   Wt 122 lb 6.4 oz (55.5 kg)   SpO2 97%   BMI 21.68 kg/m @  General:  NAD Eyes:   anicteric Lungs:  clear Heart::  S1S2 no rubs, murmurs or gallops Abdomen:  soft and nontender, BS+ no HSM on exam Ext:   no edema, cyanosis or clubbing    Data Reviewed:

## 2022-08-13 NOTE — Patient Instructions (Addendum)
Your provider has requested that you go to the basement level for lab work before leaving today. Press "B" on the elevator. The lab is located at the first door on the left as you exit the elevator.  You have been scheduled for an abdominal ultrasound at [facility] (1st floor of hospital) on 08/23/2022 at 9:00AM. Please arrive 15 minutes prior to your appointment for registration. Make certain not to have anything to eat or drink 6 hours prior to your appointment. Should you need to reschedule your appointment, please contact radiology at [phone]. This test typically takes about 30 minutes to perform.  I appreciate the opportunity to care for you. Stan Head, MD, [facility]

## 2022-08-14 ENCOUNTER — Other Ambulatory Visit: Payer: Self-pay | Admitting: Emergency Medicine

## 2022-08-14 MED ORDER — TRELEGY ELLIPTA 100-62.5-25 MCG/ACT IN AEPB: 1.0000 | INHALATION_SPRAY | Freq: Every day | RESPIRATORY_TRACT | 11 refills | Status: DC

## 2022-08-14 NOTE — Telephone Encounter (Signed)
New prescription sent to pharmacy of record.  Thanks.

## 2022-08-16 LAB — AFP TUMOR MARKER: AFP-Tumor Marker: 4.4 ng/mL (ref <6.1)

## 2022-08-16 LAB — HEPATITIS B DNA, ULTRAQUANTITATIVE, PCR
Hepatitis B DNA: 16 IU/mL — ABNORMAL HIGH
Hepatitis B virus DNA: 1.19 Log IU/mL — ABNORMAL HIGH

## 2022-08-16 LAB — EXTRA SPECIMEN

## 2022-08-20 ENCOUNTER — Other Ambulatory Visit (HOSPITAL_COMMUNITY): Payer: Managed Care, Other (non HMO)

## 2022-08-23 ENCOUNTER — Ambulatory Visit (HOSPITAL_COMMUNITY)
Admission: RE | Admit: 2022-08-23 | Discharge: 2022-08-23 | Disposition: A | Discharge status: Home / Self Care | Payer: Managed Care, Other (non HMO) | Source: Ambulatory Visit | Attending: Internal Medicine | Admitting: Internal Medicine

## 2022-08-23 DIAGNOSIS — R161 Splenomegaly, not elsewhere classified: Secondary | ICD-10-CM

## 2022-08-23 DIAGNOSIS — B1910 Unspecified viral hepatitis B without hepatic coma: Secondary | ICD-10-CM | POA: Diagnosis present

## 2022-08-23 DIAGNOSIS — K7460 Unspecified cirrhosis of liver: Secondary | ICD-10-CM | POA: Diagnosis present

## 2022-09-18 ENCOUNTER — Ambulatory Visit: Payer: Managed Care, Other (non HMO) | Admitting: Family

## 2022-10-03 ENCOUNTER — Other Ambulatory Visit (HOSPITAL_COMMUNITY): Payer: Self-pay

## 2022-10-03 ENCOUNTER — Other Ambulatory Visit: Payer: Self-pay | Admitting: Family

## 2022-10-03 ENCOUNTER — Telehealth: Payer: Self-pay

## 2022-10-03 ENCOUNTER — Other Ambulatory Visit: Payer: Self-pay

## 2022-10-03 MED ORDER — DESCOVY 200-25 MG PO TABS
1.0000 | ORAL_TABLET | Freq: Every day | ORAL | 11 refills | Status: DC
  Filled 2022-10-03 – 2022-11-05 (×2): qty 30, 30d supply, fill #0
  Filled 2022-11-22 – 2022-12-02 (×2): qty 30, 30d supply, fill #1
  Filled 2023-01-13 (×2): qty 30, 30d supply, fill #2
  Filled 2023-02-17 – 2023-02-19 (×2): qty 30, 30d supply, fill #3
  Filled 2023-03-24: qty 30, 30d supply, fill #4
  Filled 2023-04-28 (×2): qty 30, 30d supply, fill #5
  Filled 2023-05-26: qty 30, 30d supply, fill #6
  Filled 2023-07-23: qty 30, 30d supply, fill #7
  Filled 2023-08-15: qty 30, 30d supply, fill #8
  Filled 2023-09-15 – 2023-09-22 (×2): qty 30, 30d supply, fill #9

## 2022-10-03 NOTE — Telephone Encounter (Signed)
[facility] Patient Advocate Encounter °  °Was successful in obtaining a [employer] copay card for Descovy.  This copay card will make the patients copay 0.00. ° °I have spoken with the patient.   ° °The billing information is as follows and has been shared with [facility]. ° ° ° ° ° ° °Ephrem Carrick, CPhT °Specialty Pharmacy Patient Advocate °[facility] °Phone: [phone] °Fax:  [phone]  °

## 2022-10-14 ENCOUNTER — Other Ambulatory Visit: Payer: Self-pay

## 2022-10-14 ENCOUNTER — Ambulatory Visit (INDEPENDENT_AMBULATORY_CARE_PROVIDER_SITE_OTHER): Payer: Managed Care, Other (non HMO) | Admitting: Family

## 2022-10-14 ENCOUNTER — Encounter: Payer: Self-pay | Admitting: Family

## 2022-10-14 VITALS — BP 111/64 | HR 64 | Temp 97.4°F | Ht 64.0 in | Wt 126.0 lb

## 2022-10-14 DIAGNOSIS — B1910 Unspecified viral hepatitis B without hepatic coma: Secondary | ICD-10-CM | POA: Diagnosis not present

## 2022-10-14 DIAGNOSIS — B181 Chronic viral hepatitis B without delta-agent: Secondary | ICD-10-CM | POA: Diagnosis not present

## 2022-10-14 DIAGNOSIS — K7460 Unspecified cirrhosis of liver: Secondary | ICD-10-CM

## 2022-10-14 NOTE — Assessment & Plan Note (Signed)
Followed by GI. No evidence of mass/lesions on ultrasound. Continue current dose of Descovy and follow up with GI as recommended.

## 2022-10-14 NOTE — Assessment & Plan Note (Signed)
Jose Snow has good adherence and tolerance to Descovy. Reviewed previous lab work and imaging and discussed importance of staying on medication and avoid abrupt stopping to reduce risk of flare. Will need to continue Descovy indefinitely in the setting of cirrhosis and chronic hepatitis B. Check lab work. Plan for follow up in 6 months or sooner if needed pending lab work results.

## 2022-10-14 NOTE — Patient Instructions (Signed)
Nice to see you. ? ?We will check your lab work today. ? ?Continue to take your medication daily as prescribed. ? ?Refills have been sent to the pharmacy. ? ?Plan for follow up in 6 months or sooner if needed with lab work on the same day. ? ?Have a great day and stay safe! ? ?

## 2022-10-14 NOTE — Progress Notes (Signed)
Subjective:    Patient ID: Jose Snow, male    DOB: 1971/03/31, 52 y.o.   MRN: [id number]  Chief Complaint  Patient presents with   Follow-up    Hep B    HPI:  Jose Snow is a 52 y.o. male with chronic Hepatitis B complicated by cirrhosis last seen on 04/10/22 with less than optimal adherence and good tolerance to Viread secondary to insurance issues. There were no signs/symptoms of flare. Viral load had been 38. In the interim has been seen by GI with lab work on 08/13/22 showing Hepatitis B DNA level of 16. Ultrasound with cirrhosis and no mass identified. [name] primary preferred language is Montagnard and a medical interpreter is present to aid in communication as he is here for follow up.  [name] recently started Descovy secondary to changes in insurance coverage. Has been taking the Descovy as prescribed with no adverse side effects and medication is being mailed to him. Feeling well with no concerns now that medication issues have been resolved.  Denies abdominal pain, nausea, vomiting, fatigue, fever, scleral icterus or jaundice.    Allergies  Allergen Reactions   Other     Yellow cough medicine makes him dizzy      Outpatient Medications Prior to Visit  Medication Sig Dispense Refill   B-D UF III MINI PEN NEEDLES 31G X 5 MM MISC daily. use as directed     emtricitabine-tenofovir AF (DESCOVY) 200-25 MG tablet Take 1 tablet by mouth daily. 30 tablet 11   Fluticasone-Umeclidin-Vilant (TRELEGY ELLIPTA) 100-62.5-25 MCG/ACT AEPB Inhale 1 puff into the lungs daily. 1 each 11   insulin glargine (LANTUS) 100 UNIT/ML injection Inject 6 Units into the skin daily.      Lancets (ONETOUCH DELICA PLUS LANCET33G) MISC Apply topically.     nadolol (CORGARD) 20 MG tablet TAKE 1 TABLET(20 MG) BY MOUTH DAILY 90 tablet 3   omeprazole (PRILOSEC) 40 MG capsule TAKE 1 CAPSULE BY MOUTH DAILY BEFORE BREAKFAST 90 capsule 3   albuterol (VENTOLIN HFA) 108 (90 Base) MCG/ACT inhaler Inhale  1-2 puffs into the lungs every 4 (four) hours as needed for wheezing or shortness of breath. 1 each 6   metFORMIN (GLUCOPHAGE) 500 MG tablet Take 1 tablet (500 mg total) by mouth daily. (Patient not taking: Reported on 10/14/2022) 90 tablet 3   No facility-administered medications prior to visit.     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      Past Surgical History:  Procedure Laterality Date   BIOPSY  10/23/2017   Procedure: BIOPSY;  Surgeon: Rachael Fee, MD;  [facility]: [facility];  Service: Endoscopy;;   BIOPSY  03/03/2018   Procedure: BIOPSY;  Surgeon: Lemar Lofty., MD;  Location: [facility] ENDOSCOPY;  Service: Gastroenterology;;   COLONOSCOPY WITH PROPOFOL N/A 10/23/2017   Procedure: COLONOSCOPY WITH PROPOFOL;  Surgeon: Rachael Fee, MD;  Location: [facility];  Service: Endoscopy;  Laterality: N/A;   COMPLEX WOUND CLOSURE Right 12/15/2015   Procedure: COMPLEX WOUND CLOSURE;  Surgeon: Betha Loa, MD;  Location: [facility] OR;  Service: Orthopedics;  Laterality: Right;   ESOPHAGOGASTRODUODENOSCOPY (EGD) WITH PROPOFOL N/A 10/23/2017   Procedure: ESOPHAGOGASTRODUODENOSCOPY (EGD) WITH PROPOFOL;  Surgeon: Rachael Fee,  MD;  Location: [facility] ENDOS[facility];  Service: Endoscopy;  Laterality: N/A;   ESOPHAGOGASTRODUODENOSCOPY (EGD) WITH PROPOFOL N/A 03/03/2018   Procedure: ESOPHAGOGASTRODUODENOSCOPY (EGD) WITH PROPOFOL;  Surgeon: Meridee Score Netty Starring., MD;  Location[facility]COPY;  Service: Gastroenterology;  Laterality: N/A;   I & D EXTREMITY Right 12/15/2015   Procedure: IRRIGATION AND DEBRIDEMENT AND REVISION AMPUTATION RIGHT RING FINGER;  Surgeon: Betha Loa, MD;  Location: [facility] OR;  Service: Orthopedics;  Laterality: Right;       Review of Systems  Constitutional:  Negative  for chills, diaphoresis, fatigue and fever.  Respiratory:  Negative for cough, chest tightness, shortness of breath and wheezing.   Cardiovascular:  Negative for chest pain.  Gastrointestinal:  Negative for abdominal distention, abdominal pain, constipation, diarrhea, nausea and vomiting.  Neurological:  Negative for weakness and headaches.  Hematological:  Does not bruise/bleed easily.      Objective:    BP 111/64   Pulse 64   Temp (!) 97.4 F (36.3 C) (Temporal)   Ht 5\' 4"  (1.626 m)   Wt 126 lb (57.2 kg)   BMI 21.63 kg/m  Nursing note and vital signs reviewed.  Physical Exam Constitutional:      General: He is not in acute distress.    Appearance: He is well-developed.  Cardiovascular:     Rate and Rhythm: Normal rate and regular rhythm.     Heart sounds: Normal heart sounds. No murmur heard.    No friction rub. No gallop.  Pulmonary:     Effort: Pulmonary effort is normal. No respiratory distress.     Breath sounds: Normal breath sounds. No wheezing or rales.  Chest:     Chest wall: No tenderness.  Abdominal:     General: Bowel sounds are normal. There is no distension.     Palpations: Abdomen is soft. There is no mass.     Tenderness: There is no abdominal tenderness. There is no guarding or rebound.  Skin:    General: Skin is warm and dry.  Neurological:     Mental Status: He is alert and oriented to person, place, and time.  Psychiatric:        Behavior: Behavior normal.        Thought Content: Thought content normal.        Judgment: Judgment normal.         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)  11/25/2014    Priority: High   Chronic venous insufficiency 08/08/2022   Hepatoma (HCC) 01/31/2022   Moderate COPD (chronic obstructive pulmonary disease) (HCC) 01/31/2022   Liver cirrhosis secondary to NASH (HCC)    Chronic cough 12/08/2019   Pancytopenia (HCC) 12/03/2017   Abnormal echocardiogram 12/03/2017   History of hepatitis B 12/03/2017   Type 2 diabetes mellitus with other specified complication, without long-term current use of insulin (HCC)    Esophageal varices without bleeding (HCC)    Heart murmur 08/01/2017  Thrombocytopenia (HCC) 11/25/2014   History of TB (tuberculosis) 07/16/2010     Problem List Items Addressed This Visit       Digestive   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 No evidence of mass/lesions on ultrasound. Continue current dose of Descovy and follow up with GI as recommended.       Relevant Orders   COMPLETE METABOLIC PANEL WITH GFR   Hepatitis B DNA, ultraquantitative, PCR   Hepatic function panel   CBC   Hepatitis B surface antibody,qualitative   Hepatitis B surface antigen   Hepatitis B e antigen   Hepatitis B e antibody   Hepatitis B - Primary    Mr. Schaer has good adherence and tolerance to Descovy. Reviewed previous lab work and imaging and discussed importance of staying on medication and avoid abrupt stopping to reduce risk of flare. Will need to continue Descovy indefinitely in the setting of cirrhosis and chronic hepatitis B. Check lab work. Plan for follow up in 6 months or sooner if needed pending lab work results.       Relevant Orders   COMPLETE METABOLIC PANEL WITH GFR   Hepatitis B DNA, ultraquantitative, PCR   Hepatic function panel   CBC   Hepatitis B surface antibody,qualitative   Hepatitis B surface antigen   Hepatitis B e antigen   Hepatitis B e antibody     I am having Bensyn Staley maintain his insulin glargine, B-D UF III MINI PEN NEEDLES, OneTouch Delica Plus Lancet33G, albuterol,  metFORMIN, omeprazole, nadolol, Trelegy Ellipta, and Descovy.   Follow-up: Return in about 6 months (around 04/16/2023), or if symptoms worsen or fail to improve.   Marcos Eke, MSN, FNP-C Nurse Practitioner [facility] for Infectious Disease [facility] Main number: [phone]

## 2022-10-15 LAB — COMPLETE METABOLIC PANEL WITH GFR
AG Ratio: 1 (calc) (ref 1.0–2.5)
ALT: 33 U/L (ref 9–46)
AST: 24 U/L (ref 10–35)
CO2: 25 mmol/L (ref 20–32)
Calcium: 8.7 mg/dL (ref 8.6–10.3)
Globulin: 3.3 g/dL (calc) (ref 1.9–3.7)
Glucose, Bld: 520 mg/dL (ref 65–99)
Sodium: 130 mmol/L — ABNORMAL LOW (ref 135–146)
Total Bilirubin: 1.6 mg/dL — ABNORMAL HIGH (ref 0.2–1.2)
eGFR: 79 mL/min/{1.73_m2} (ref >=60)

## 2022-10-15 LAB — HEPATITIS B E ANTIGEN: Hep B E Ag: REACTIVE — AB

## 2022-10-15 LAB — CBC
HCT: 34.3 % — ABNORMAL LOW (ref 38.5–50.0)
Hemoglobin: 11.3 g/dL — ABNORMAL LOW (ref 13.2–17.1)
MCH: 30.5 pg (ref 27.0–33.0)
MCV: 92.5 fL (ref 80.0–100.0)
RBC: 3.71 10*6/uL — ABNORMAL LOW (ref 4.20–5.80)
RDW: 13.4 % (ref 11.0–15.0)
WBC: 2.5 10*3/uL — ABNORMAL LOW (ref 3.8–10.8)

## 2022-10-15 LAB — HEPATIC FUNCTION PANEL
AG Ratio: 1 (calc) (ref 1.0–2.5)
ALT: 33 U/L (ref 9–46)
AST: 24 U/L (ref 10–35)
Albumin: 3.4 g/dL — ABNORMAL LOW (ref 3.6–5.1)
Alkaline phosphatase (APISO): 60 U/L (ref 35–144)
Total Bilirubin: 1.6 mg/dL — ABNORMAL HIGH (ref 0.2–1.2)

## 2022-10-15 LAB — HEPATITIS B SURFACE ANTIBODY,QUALITATIVE: Hep B S Ab: NONREACTIVE

## 2022-10-15 LAB — HEPATITIS B E ANTIBODY: Hep B E Ab: NONREACTIVE

## 2022-10-17 LAB — HEPATIC FUNCTION PANEL
Bilirubin, Direct: 0.4 mg/dL — ABNORMAL HIGH (ref 0.0–0.2)
Globulin: 3.3 g/dL (calc) (ref 1.9–3.7)
Indirect Bilirubin: 1.2 mg/dL (calc) (ref 0.2–1.2)
Total Protein: 6.7 g/dL (ref 6.1–8.1)

## 2022-10-17 LAB — HEPATITIS B DNA, ULTRAQUANTITATIVE, PCR
Hepatitis B DNA: 6030000 IU/mL — ABNORMAL HIGH
Hepatitis B virus DNA: 6.78 Log IU/mL — ABNORMAL HIGH

## 2022-10-17 LAB — CBC
MCHC: 32.9 g/dL (ref 32.0–36.0)
MPV: 12.1 fL (ref 7.5–12.5)
Platelets: 34 10*3/uL — ABNORMAL LOW (ref 140–400)

## 2022-10-17 LAB — COMPLETE METABOLIC PANEL WITH GFR
Albumin: 3.4 g/dL — ABNORMAL LOW (ref 3.6–5.1)
Alkaline phosphatase (APISO): 60 U/L (ref 35–144)
BUN: 12 mg/dL (ref 7–25)
Chloride: 99 mmol/L (ref 98–110)
Creat: 1.11 mg/dL (ref 0.70–1.30)
Potassium: 4.6 mmol/L (ref 3.5–5.3)
Total Protein: 6.7 g/dL (ref 6.1–8.1)

## 2022-10-17 LAB — HEPATITIS B SURFACE ANTIGEN: Hepatitis B Surface Ag: REACTIVE — AB

## 2022-10-26 ENCOUNTER — Emergency Department (HOSPITAL_COMMUNITY): Payer: Managed Care, Other (non HMO)

## 2022-10-26 ENCOUNTER — Encounter (HOSPITAL_COMMUNITY): Payer: Self-pay | Admitting: Emergency Medicine

## 2022-10-26 ENCOUNTER — Emergency Department (HOSPITAL_COMMUNITY)
Admission: EM | Admit: 2022-10-26 | Discharge: 2022-10-26 | Disposition: A | Discharge status: Home / Self Care | Payer: Managed Care, Other (non HMO) | Attending: Emergency Medicine | Admitting: Emergency Medicine

## 2022-10-26 ENCOUNTER — Other Ambulatory Visit: Payer: Self-pay

## 2022-10-26 DIAGNOSIS — T8130XA Disruption of wound, unspecified, initial encounter: Secondary | ICD-10-CM | POA: Diagnosis not present

## 2022-10-26 DIAGNOSIS — S61210S Laceration without foreign body of right index finger without damage to nail, sequela: Secondary | ICD-10-CM | POA: Diagnosis not present

## 2022-10-26 DIAGNOSIS — Z794 Long term (current) use of insulin: Secondary | ICD-10-CM | POA: Insufficient documentation

## 2022-10-26 DIAGNOSIS — E119 Type 2 diabetes mellitus without complications: Secondary | ICD-10-CM | POA: Diagnosis not present

## 2022-10-26 DIAGNOSIS — Y990 Civilian activity done for income or pay: Secondary | ICD-10-CM | POA: Insufficient documentation

## 2022-10-26 DIAGNOSIS — Z7984 Long term (current) use of oral hypoglycemic drugs: Secondary | ICD-10-CM | POA: Insufficient documentation

## 2022-10-26 DIAGNOSIS — S6990XS Unspecified injury of unspecified wrist, hand and finger(s), sequela: Secondary | ICD-10-CM | POA: Diagnosis present

## 2022-10-26 DIAGNOSIS — W319XXA Contact with unspecified machinery, initial encounter: Secondary | ICD-10-CM | POA: Insufficient documentation

## 2022-10-26 MED ORDER — CEPHALEXIN 500 MG PO CAPS: 500.0000 mg | ORAL_CAPSULE | Freq: Four times a day (QID) | ORAL | 0 refills | Status: DC

## 2022-10-26 NOTE — Discharge Instructions (Addendum)
You were seen in the ER for worsening swelling of your finger laceration.  I believe this area started to develop an infection. I am going to change your antibiotic to one called Cephalexin (Keflex). I would like to to follow up with the hand specialist, and I have attached his contact information for you to call and make an appointment.  You can take ibuprofen or tylenol for pain.  Make sure to keep the area as clean and dry as possible. You can let warm soapy warm run over the area, but do NOT scrub it. Redress it once the bandaging gets wet, ideally at least once daily.   Watch out for additional signs of infection including: increased redness, tenderness, or drainage of pus from the area.   Continue to monitor how you're doing and return to the ER for new or worsening symptoms.

## 2022-10-26 NOTE — ED Triage Notes (Signed)
Pt arrived via POV. Pt injured R index finger 3x days ago in machine at work. Finger was x-rayed and sutured. Pt c/o continuing pain in finger. No noticeable purulent drainage, or swelling seen in triage.  AOx4

## 2022-10-26 NOTE — ED Provider Notes (Signed)
I provided a substantive portion of the care of this patient.  I personally made/approved the management plan for this patient and take responsibility for the patient management.   Patient here with right index finger injury recheck.  Patient on exam does have increased swelling and tenderness.  No active drainage.  Concern for possible deep space infection and will consult hand surgery      Lorre Nick, MD 10/26/22 1121

## 2022-10-26 NOTE — ED Provider Notes (Signed)
[place] EMERGENCY DEPARTMENT AT [facility] Provider Note   CSN: [id number] Arrival date & time: 10/26/22  6213     History  Chief Complaint  Patient presents with   Finger Injury    Jose Snow is a 52 y.o. male with history of hepatitis B, NASH cirrhosis, tuberculosis, lymphocytic colitis, diabetes mellitus, who presents to the ER complaining of finger pain after injury 2 days ago. Injured it using a machine at work. Sent to a clinic by his employer where they performed x-rays and sutured the wound closed. Discharged on antibiotics but he is not sure what the name of it is. Went to follow up visit yesterday and was doing okay. Today has more tenderness and bloody drainage from the wound. No fever.   Daughter was able to find the name of the patient's antibiotic, he has been taking augmentin.   The history is provided by the patient and a relative. The history is limited by a language barrier. A language interpreter was used (Daughter acting as interpreter).       Home Medications Prior to Admission medications   Medication Sig Start Date End Date Taking? Authorizing Provider  cephALEXin (KEFLEX) 500 MG capsule Take 1 capsule (500 mg total) by mouth 4 (four) times daily. 10/26/22  Yes Docia Klar T, PA-C  albuterol (VENTOLIN HFA) 108 (90 Base) MCG/ACT inhaler Inhale 1-2 puffs into the lungs every 4 (four) hours as needed for wheezing or shortness of breath. 01/31/22 08/13/22  Georgina Quint, MD  B-D UF III MINI PEN NEEDLES 31G X 5 MM MISC daily. use as directed 06/21/19   [provider]  emtricitabine-tenofovir AF (DESCOVY) 200-25 MG tablet Take 1 tablet by mouth daily. 10/03/22   Veryl Speak, FNP  Fluticasone-Umeclidin-Vilant (TRELEGY ELLIPTA) 100-62.5-25 MCG/ACT AEPB Inhale 1 puff into the lungs daily. 08/14/22   Georgina Quint, MD  insulin glargine (LANTUS) 100 UNIT/ML injection Inject 6 Units into the skin daily.     [provider]  Lancets [facility] DELICA PLUS [place]) MISC Apply topically. 09/06/20   [provider]  metFORMIN (GLUCOPHAGE) 500 MG tablet Take 1 tablet (500 mg total) by mouth daily. Patient not taking: Reported on 10/14/2022 08/08/22 08/03/23  Georgina Quint, MD  nadolol (CORGARD) 20 MG tablet TAKE 1 TABLET(20 MG) BY MOUTH DAILY 08/13/22   Iva Boop, MD  omeprazole (PRILOSEC) 40 MG capsule TAKE 1 CAPSULE BY MOUTH DAILY BEFORE BREAKFAST 08/13/22   Iva Boop, MD      Allergies    Other    Review of Systems   Review of Systems  Skin:  Positive for wound.  All other systems reviewed and are negative.   Physical Exam Updated Vital Signs BP 118/61 (BP Location: Left Arm)   Pulse (!) 57   Temp 97.6 F (36.4 C) (Oral)   Resp 17   SpO2 97%  Physical Exam Vitals and nursing note reviewed.  Constitutional:      Appearance: Normal appearance.  HENT:     Head: Normocephalic and atraumatic.  Eyes:     Conjunctiva/sclera: Conjunctivae normal.  Pulmonary:     Effort: Pulmonary effort is normal. No respiratory distress.  Musculoskeletal:     Comments: No tenderness to palpation of the flexor tendon sheath.   Skin:    General: Skin is warm and dry.     Comments: Partially healing laceration to the right pointer finger with some evidence of wound dehiscence. Slightly decreased PIP  flexion of the digit due to pain. Warmth at the tip of the finger with associated tenderness and erythema. (As imaged below)  Neurological:     Mental Status: He is alert.  Psychiatric:        Mood and Affect: Mood normal.        Behavior: Behavior normal.           ED Results / Procedures / Treatments   Labs (all labs ordered are listed, but only abnormal results are displayed) Labs Reviewed - No data to display  EKG None  Radiology DG Finger Index Right  Result Date: 10/26/2022 CLINICAL DATA:  Finger injury 2 days ago. Worsening pain and swelling. EXAM: RIGHT INDEX  FINGER 2+V COMPARISON:  None Available. FINDINGS: There is no evidence of fracture or dislocation. There is no evidence of arthropathy or other focal bone abnormality. Soft tissues are unremarkable. IMPRESSION: Negative. Electronically Signed   By: Kennith Center M.D.   On: 10/26/2022 11:08    Procedures Procedures    Medications Ordered in ED Medications - No data to display  ED Course/ Medical Decision Making/ A&P                             Medical Decision Making  This patient is a 52 y.o. male who presents to the ED for concern of worsening finger pain.   Differential diagnoses prior to evaluation: Cellulitis, osteomyelitis  Past Medical History / Social History / Additional history: Chart reviewed. Pertinent results include: hepatitis B, NASH cirrhosis, tuberculosis, lymphocytic colitis, diabetes mellitus  Physical Exam: Physical exam performed. The pertinent findings include: evidence of wound dehiscence to right index finger with tenderness, erythema, and warmth at the distal tip. Some limited PIP flexion due to pain. No tenderness to palpation of the flexor tendon sheath.   Imaging: XR performed of right index finger which shows no evidence of deep space infection.   Consultations obtained: I consulted with hand specialist Dr Izora Ribas who recommended encouraging good wound care/cleaning, changing to cephalexin and following up with him in clinic next week.    Disposition: After consideration of the diagnostic results and the patients response to treatment, I feel that emergency department workup does not suggest an emergent condition requiring admission or immediate intervention beyond what has been performed at this time. The plan is: discharge to home with wound care instructions, change antibiotics to cephalexin, and follow up with hand surgery next week. Low concern for deeper infection with normal XR, no evidence of flexor tenosynovitis. The patient is safe for discharge  and has been instructed to return immediately for worsening symptoms, change in symptoms or any other concerns.  I discussed this case with my attending physician Dr. Freida Busman who cosigned this note including patient's presenting symptoms, physical exam, and planned diagnostics and interventions. Attending physician stated agreement with plan or made changes to plan which were implemented.   Final Clinical Impression(s) / ED Diagnoses Final diagnoses:  Laceration of right index finger without foreign body without damage to nail, sequela  Wound dehiscence    Rx / DC Orders ED Discharge Orders          Ordered    cephALEXin (KEFLEX) 500 MG capsule  4 times daily        10/26/22 1312           Portions of this report may have been transcribed using voice recognition software. Every effort was made  to ensure accuracy; however, inadvertent computerized transcription errors may be present.    Jeanella Flattery 10/26/22 1317    Lorre Nick, MD 10/27/22 [phone]

## 2022-10-29 ENCOUNTER — Ambulatory Visit: Payer: Managed Care, Other (non HMO) | Admitting: Emergency Medicine

## 2022-10-30 ENCOUNTER — Encounter: Payer: Self-pay | Admitting: Physician Assistant

## 2022-10-30 ENCOUNTER — Ambulatory Visit (INDEPENDENT_AMBULATORY_CARE_PROVIDER_SITE_OTHER): Payer: Managed Care, Other (non HMO) | Admitting: Physician Assistant

## 2022-10-30 DIAGNOSIS — S61210A Laceration without foreign body of right index finger without damage to nail, initial encounter: Secondary | ICD-10-CM

## 2022-10-30 DIAGNOSIS — S61219A Laceration without foreign body of unspecified finger without damage to nail, initial encounter: Secondary | ICD-10-CM | POA: Insufficient documentation

## 2022-10-30 NOTE — Progress Notes (Signed)
Office Visit Note   Patient: Jose Snow           Date of Birth: 08-Nov-1970           MRN: [id number] Visit Date: 10/30/2022              Requested by: Georgina Quint, MD [address] [place],  [place] [address] PCP: Georgina Quint, MD  Chief Complaint  Patient presents with   Right Hand - Wound Check    Finger laceration      HPI: Patient is a 52 year old gentleman who is 5 days status post injuring his right index finger at work using machinery.  From previous notes it sounds as though he was sutured at his place of business and x-rays were taken.  He had concerns and went to the ER twice on July 20 and there were some concerns that he might have a deep infection given his pain he also had limited flexion of the IP joint.  Dr. Izora Ribas was consulted.  Patient was put on a has been on antibiotics Augmentin I believe denies any fever or chills.  He was placed in a dressing and was to follow-up with Dr. Izora Ribas..  Assessment & Plan: Visit Diagnoses: Laceration right index finger  Plan: I talked with the patient through an interpreter.  We did try to call them earlier today with family that does speak English and left a message.  He is to follow-up with Dr. Izora Ribas.  I have called the office and made arrangements for him to see him Friday at 8:30 in the morning.  I did look at his finger today I do not see any evidence of cellulitis or infection.  He does have limited flexion of the finger x-rays did not show any fracture.  He understands if he begins to have any increasing swelling or erythema prior to his visit on Friday he is to contact their office he is being provided all the address and contact information  Follow-Up Instructions: No follow-ups on file.   Ortho Exam  Patient is alert, oriented, no adenopathy, well-dressed, normal affect, normal respiratory effort. Right index finger.  Sutures in place minimal wound dehiscence no foul odor no surrounding cellulitis no  purulent drainage.  Finger is warm.  He has limited flexion of the finger.  No evidence of infection  Imaging: No results found. No images are attached to the encounter.  Labs: Lab Results  Component Value Date   HGBA1C 5.9 (A) 08/08/2022   HGBA1C 6.0 (A) 01/31/2022   HGBA1C 4.9 08/20/2018   REPTSTATUS 02/17/2016 FINAL 02/12/2016   CULT  02/12/2016    NO GROWTH 5 DAYS Performed at [facility]    [facility] ESCHERICHIA COLI 02/11/2016     Lab Results  Component Value Date   ALBUMIN 3.6 08/08/2022   ALBUMIN 3.9 06/19/2021   ALBUMIN 4.0 12/01/2019    No results found for: "MG" Lab Results  Component Value Date   VD25OH 14.7 (L) 12/03/2017    No results found for: "PREALBUMIN"    Latest Ref Rng & Units 10/14/2022    4:24 AM 08/08/2022    2:50 PM 02/11/2022    4:12 PM  CBC EXTENDED  WBC 3.8 - 10.8 Thousand/uL 2.5  2.6  3.0   RBC 4.20 - 5.80 Million/uL 3.71  4.11  3.84   Hemoglobin 13.2 - 17.1 g/dL 19.1  47.8  29.5   HCT 38.5 - 50.0 % 34.3  37.1  34.7  Platelets 140 - 400 Thousand/uL 34  32.0 Repeated and verified X2.  28   NEUT# 1.4 - 7.7 K/uL  1.2    Lymph# 0.7 - 4.0 K/uL  1.0       There is no height or weight on file to calculate BMI.  Orders:  No orders of the defined types were placed in this encounter.  No orders of the defined types were placed in this encounter.    Procedures: No procedures performed  Clinical Data: No additional findings.  ROS:  All other systems negative, except as noted in the HPI. Review of Systems  Objective: Vital Signs: There were no vitals taken for this visit.  Specialty Comments:  No specialty comments available.  PMFS History: Patient Active Problem List   Diagnosis Date Noted   Chronic venous insufficiency 08/08/2022   Hepatoma (HCC) 01/31/2022   Moderate COPD (chronic obstructive pulmonary disease) (HCC) 01/31/2022   Liver cirrhosis secondary to NASH (HCC)    Chronic cough 12/08/2019   Pancytopenia  (HCC) 12/03/2017   Abnormal echocardiogram 12/03/2017   History of hepatitis B 12/03/2017   Type 2 diabetes mellitus with other specified complication, without long-term current use of insulin (HCC)    Esophageal varices without bleeding (HCC)    Heart murmur 08/01/2017   Hepatitis B 02/17/2015   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) 11/25/2014   Thrombocytopenia (HCC) 11/25/2014   History of TB (tuberculosis) 07/16/2010   Past Medical History:  Diagnosis Date   Cirrhosis (HCC)    Diabetes mellitus (HCC)    Hepatitis B    Liver cirrhosis secondary to NASH (HCC)    Lymphocytic colitis    Pancytopenia (HCC)    TB (tuberculosis)    treated 15 months at health dept.     Family History  Problem Relation Age of Onset   Colon cancer Neg Hx    Esophageal cancer Neg Hx    Pancreatic cancer Neg Hx    Stomach cancer Neg Hx    Liver disease Neg Hx     Past Surgical History:  Procedure Laterality Date   BIOPSY  10/23/2017   Procedure: BIOPSY;  Surgeon: Rachael Fee, MD;  Location: [facility] ENDOSCOPY;  Service: Endoscopy;;   BIOPSY  03/03/2018   Procedure: BIOPSY;  Surgeon: Lemar Lofty., MD;  Location: [facility] ENDOSCOPY;  Service: Gastroenterology;;   COLONOSCOPY WITH PROPOFOL N/A 10/23/2017   Procedure: COLONOSCOPY WITH PROPOFOL;  Surgeon: Rachael Fee, MD;  Location: [facility] ENDOSCOPY;  Service: Endoscopy;  Laterality: N/A;   COMPLEX WOUND CLOSURE Right 12/15/2015   Procedure: COMPLEX WOUND CLOSURE;  Surgeon: Betha Loa, MD;  Location: [facility] OR;  Service: Orthopedics;  Laterality: Right;   ESOPHAGOGASTRODUODENOSCOPY (EGD) WITH PROPOFOL N/A 10/23/2017   Procedure: ESOPHAGOGASTRODUODENOSCOPY (EGD) WITH PROPOFOL;  Surgeon: Rachael Fee, MD;  Location: [facility] ENDOSCOPY;  Service: Endoscopy;  Laterality: N/A;   ESOPHAGOGASTRODUODENOSCOPY (EGD) WITH PROPOFOL N/A 03/03/2018   Procedure: ESOPHAGOGASTRODUODENOSCOPY (EGD) WITH PROPOFOL;  Surgeon: Meridee Score Netty Starring., MD;  Location: [facility]  ENDOSCOPY;  Service: Gastroenterology;  Laterality: N/A;   I & D EXTREMITY Right 12/15/2015   Procedure: IRRIGATION AND DEBRIDEMENT AND REVISION AMPUTATION RIGHT RING FINGER;  Surgeon: Betha Loa, MD;  Location: [facility] OR;  Service: Orthopedics;  Laterality: Right;   Social History   Occupational History   Occupation: [employer]    Comment: Builds chair parts  Tobacco Use   Smoking status: Former    Current packs/day: 0.00    Average packs/day:  0.5 packs/day for 8.0 years (4.0 ttl pk-yrs)    Types: Cigarettes    Start date: 04/08/1982    Quit date: 04/08/1990    Years since quitting: 32.5   Smokeless tobacco: Never  Vaping Use   Vaping status: Never Used  Substance and Sexual Activity   Alcohol use: No   Drug use: No   Sexual activity: Yes

## 2022-10-30 NOTE — Congregational Nurse Program (Signed)
CN office visit with interpreter Diu Hartshorn assisting.  Patient requested assistance with applying for Medicaid and food stamps.  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 They sent him to an urgent care on [address] Where he received stitches and an antibiotic prescription.  He was referred to a hand specialist which does not accept Rosann Auerbach (his insurance terminates on 11/06/22).  Medical Student Kathalene Frames assisted patient is scheduling an appointment today with [facility] @ 3:15 today for follow-up.  He was referred to [employer] for assistance in filing for Unemployment benefits.  Yvonne Kendall plans to help patient seek legal advice regarding termination.  Brantley Fling RN, Congregational Nurse [phone]

## 2022-10-31 ENCOUNTER — Encounter: Payer: Self-pay | Admitting: Internal Medicine

## 2022-10-31 ENCOUNTER — Ambulatory Visit (INDEPENDENT_AMBULATORY_CARE_PROVIDER_SITE_OTHER): Payer: Managed Care, Other (non HMO) | Admitting: Internal Medicine

## 2022-10-31 VITALS — BP 118/74 | HR 82 | Temp 98.3°F | Ht 64.0 in | Wt 115.0 lb

## 2022-10-31 DIAGNOSIS — Z7984 Long term (current) use of oral hypoglycemic drugs: Secondary | ICD-10-CM

## 2022-10-31 DIAGNOSIS — S61210D Laceration without foreign body of right index finger without damage to nail, subsequent encounter: Secondary | ICD-10-CM | POA: Diagnosis not present

## 2022-10-31 DIAGNOSIS — Z0001 Encounter for general adult medical examination with abnormal findings: Secondary | ICD-10-CM

## 2022-10-31 DIAGNOSIS — I8510 Secondary esophageal varices without bleeding: Secondary | ICD-10-CM | POA: Diagnosis not present

## 2022-10-31 DIAGNOSIS — J449 Chronic obstructive pulmonary disease, unspecified: Secondary | ICD-10-CM | POA: Diagnosis not present

## 2022-10-31 DIAGNOSIS — E1169 Type 2 diabetes mellitus with other specified complication: Secondary | ICD-10-CM

## 2022-10-31 MED ORDER — FLUTICASONE-SALMETEROL 250-50 MCG/ACT IN AEPB: 1.0000 | INHALATION_SPRAY | Freq: Two times a day (BID) | RESPIRATORY_TRACT | 11 refills | Status: DC

## 2022-10-31 NOTE — Patient Instructions (Signed)
Ok to change the trelegy to wihela inhaler   Continue to monitor your right index finger for signs of infection  Please continue all other medications as before, and refills have been done if requested.  Please have the pharmacy call with any other refills you may need.  Please continue your efforts at being more active, low cholesterol diet, and weight control.  You are otherwise up to date with prevention measures today.  Please keep your appointments with your specialists as you may have planned - hand surgury tomorrow for the stitches to come out  You will be contacted regarding the referral for: Eye doctor  No further lab work is needed today  Please make an Appointment to return in 6 months, or sooner if needed, to your PCP - Dr Alvy Bimler

## 2022-10-31 NOTE — Progress Notes (Signed)
Patient ID: Jose Snow, male   DOB: 09-25-1970, 52 y.o.   MRN: [id number]         Chief Complaint:: wellness exam and Hospitalization Follow-up (Right index finger injury follow up; Has appointment with hand specialist tomorrow)  , copd, dm, finger laceration       HPI:  Jose Snow is a 52 y.o. male here for wellness exam as PCP not available today, for shingrix at pharmacy, decliens covid booster, o/w up to date , also due for diabetic eye exam                Also unable to afford trelegy, asks for generic inhaler.  Pt denies chest pain, increased sob or doe, wheezing, orthopnea, PND, increased LE swelling, palpitations, dizziness or syncope.   Pt denies polydipsia, polyuria, or new focal neuro s/s.   Pt denies fever, wt loss, night sweats, loss of appetite, or other constitutional symptoms  did also have recent right index finger laceration, seen per ortho yesterday doing well without s/s infection, and has f/u appt for stitches out tomorrow.  Denies worsening reflux, abd pain, dysphagia, n/v, bowel change or blood.     Wt Readings from Last 3 Encounters:  10/31/22 115 lb (52.2 kg)  10/14/22 126 lb (57.2 kg)  08/13/22 122 lb 6.4 oz (55.5 kg)   BP Readings from Last 3 Encounters:  10/31/22 118/74  10/26/22 118/61  10/14/22 111/64   Immunization History  Administered Date(s) Administered   Influenza,inj,Quad PF,6+ Mos 01/28/2016, 12/29/2017, 12/21/2018, 01/31/2022   Moderna Sars-Covid-2 Vaccination 11/19/2019, 12/18/2019   Pneumococcal Polysaccharide-23 08/20/2018   Tdap 12/29/2017   Zoster Recombinant(Shingrix) 01/31/2022   Health Maintenance Due  Topic Date Due   OPHTHALMOLOGY EXAM  01/07/2020   COVID-19 Vaccine (3 - Moderna risk series) 01/15/2020   Zoster Vaccines- Shingrix (2 of 2) 03/28/2022      Past Medical History:  Diagnosis Date   Cirrhosis (HCC)    Diabetes mellitus (HCC)    Hepatitis B    Liver cirrhosis secondary to NASH (HCC)    Lymphocytic colitis     Pancytopenia (HCC)    TB (tuberculosis)    treated 15 months at health dept.    Past Surgical History:  Procedure Laterality Date   BIOPSY  10/23/2017   Procedure: BIOPSY;  Surgeon: Rachael Fee, MD;  Location: [facility] ENDOSCOPY;  Service: Endoscopy;;   BIOPSY  03/03/2018   Procedure: BIOPSY;  Surgeon: Lemar Lofty., MD;  Location: [facility] ENDOSCOPY;  Service: Gastroenterology;;   COLONOSCOPY WITH PROPOFOL N/A 10/23/2017   Procedure: COLONOSCOPY WITH PROPOFOL;  Surgeon: Rachael Fee, MD;  Location: [facility] ENDOSCOPY;  Service: Endoscopy;  Laterality: N/A;   COMPLEX WOUND CLOSURE Right 12/15/2015   Procedure: COMPLEX WOUND CLOSURE;  Surgeon: Betha Loa, MD;  Location: [facility] OR;  Service: Orthopedics;  Laterality: Right;   ESOPHAGOGASTRODUODENOSCOPY (EGD) WITH PROPOFOL N/A 10/23/2017   Procedure: ESOPHAGOGASTRODUODENOSCOPY (EGD) WITH PROPOFOL;  Surgeon: Rachael Fee, MD;  Location: [facility] ENDOSCOPY;  Service: Endoscopy;  Laterality: N/A;   ESOPHAGOGASTRODUODENOSCOPY (EGD) WITH PROPOFOL N/A 03/03/2018   Procedure: ESOPHAGOGASTRODUODENOSCOPY (EGD) WITH PROPOFOL;  Surgeon: Meridee Score Netty Starring., MD;  Location: [facility] ENDOSCOPY;  Service: Gastroenterology;  Laterality: N/A;   I & D EXTREMITY Right 12/15/2015   Procedure: IRRIGATION AND DEBRIDEMENT AND REVISION AMPUTATION RIGHT RING FINGER;  Surgeon: Betha Loa, MD;  Location: [facility] OR;  Service: Orthopedics;  Laterality: Right;    reports that he quit smoking about 32 years ago. His smoking use  included cigarettes. He started smoking about 40 years ago. He has a 4 pack-year smoking history. He has never used smokeless tobacco. He reports that he does not drink alcohol and does not use drugs. family history is not on file. Allergies  Allergen Reactions   Other     Yellow cough medicine makes him dizzy   Current Outpatient Medications on File Prior to Visit  Medication Sig Dispense Refill   B-D UF III MINI PEN NEEDLES 31G X 5 MM MISC daily. use as  directed     cephALEXin (KEFLEX) 500 MG capsule Take 1 capsule (500 mg total) by mouth 4 (four) times daily. 20 capsule 0   emtricitabine-tenofovir AF (DESCOVY) 200-25 MG tablet Take 1 tablet by mouth daily. 30 tablet 11   Fluticasone-Umeclidin-Vilant (TRELEGY ELLIPTA) 100-62.5-25 MCG/ACT AEPB Inhale 1 puff into the lungs daily. 1 each 11   insulin glargine (LANTUS) 100 UNIT/ML injection Inject 6 Units into the skin daily.      Lancets (ONETOUCH DELICA PLUS LANCET33G) MISC Apply topically.     metFORMIN (GLUCOPHAGE) 500 MG tablet Take 1 tablet (500 mg total) by mouth daily. 90 tablet 3   nadolol (CORGARD) 20 MG tablet TAKE 1 TABLET(20 MG) BY MOUTH DAILY 90 tablet 3   omeprazole (PRILOSEC) 40 MG capsule TAKE 1 CAPSULE BY MOUTH DAILY BEFORE BREAKFAST 90 capsule 3   albuterol (VENTOLIN HFA) 108 (90 Base) MCG/ACT inhaler Inhale 1-2 puffs into the lungs every 4 (four) hours as needed for wheezing or shortness of breath. 1 each 6   No current facility-administered medications on file prior to visit.        ROS:  All others reviewed and negative.  Objective        PE:  BP 118/74 (BP Location: Left Arm, Patient Position: Sitting, Cuff Size: Normal)   Pulse 82   Temp 98.3 F (36.8 C) (Oral)   Ht 5\' 4"  (1.626 m)   Wt 115 lb (52.2 kg)   SpO2 100%   BMI 19.74 kg/m                 Constitutional: Pt appears in NAD               HENT: Head: NCAT.                Right Ear: External ear normal.                 Left Ear: External ear normal.                Eyes: . Pupils are equal, round, and reactive to light. Conjunctivae and EOM are normal               Nose: without d/c or deformity               Neck: Neck supple. Gross normal ROM               Cardiovascular: Normal rate and regular rhythm.                 Pulmonary/Chest: Effort normal and breath sounds without rales or wheezing.                Abd:  Soft, NT, ND, + BS, no organomegaly               Neurological: Pt is alert. At baseline  orientation, motor grossly intact  Skin: Skin is warm. , LE edema - none, right index finger laceration intact without redness, swelling, drainage               Psychiatric: Pt behavior is normal without agitation   Micro: none  Cardiac tracings I have personally interpreted today:  none  Pertinent Radiological findings (summarize): none   Lab Results  Component Value Date   WBC 2.5 (L) 10/14/2022   HGB 11.3 (L) 10/14/2022   HCT 34.3 (L) 10/14/2022   PLT 34 (L) 10/14/2022   GLUCOSE 520 (HH) 10/14/2022   CHOL 109 08/08/2022   TRIG 48.0 08/08/2022   HDL 40.60 08/08/2022   LDLCALC 59 08/08/2022   ALT 33 10/14/2022   ALT 33 10/14/2022   AST 24 10/14/2022   AST 24 10/14/2022   NA 130 (L) 10/14/2022   K 4.6 10/14/2022   CL 99 10/14/2022   CREATININE 1.11 10/14/2022   BUN 12 10/14/2022   CO2 25 10/14/2022   TSH 0.318 (L) 02/11/2016   INR 1.5 (H) 08/13/2022   HGBA1C 5.9 (A) 08/08/2022   MICROALBUR 3.1 (H) 08/08/2022   Assessment/Plan:  Jose Snow is a 52 y.o. Asian [4] male with  has a past medical history of Cirrhosis (HCC), Diabetes mellitus (HCC), Hepatitis B, Liver cirrhosis secondary to NASH (HCC), Lymphocytic colitis, Pancytopenia (HCC), and TB (tuberculosis).  Encounter for well adult exam with abnormal findings Age and sex appropriate education and counseling updated with regular exercise and diet Referrals for preventative services - for referral DM exam with optho Immunizations addressed  - for shignrix at pharmacy, declines covid booster Smoking counseling  - none needed Evidence for depression or other mood disorder - none significant Most recent labs reviewed. I have personally reviewed and have noted: 1) the patient's medical and social history 2) The patient's current medications and supplements 3) The patient's height, weight, and BMI have been recorded in the chart   Finger laceration Examined today - laceration intact without s/s  infection , for f/u ortho tomorrow for stitches out  Moderate COPD (chronic obstructive pulmonary disease) (HCC) Overall stable today, ok for change trelegy to wixhela inhaler asd  Type 2 diabetes mellitus with other specified complication, without long-term current use of insulin (HCC) Lab Results  Component Value Date   HGBA1C 5.9 (A) 08/08/2022   Stable, pt to continue current medical treatment lantus 6 u every day, metformin 500 mg every day, and refer optho for DM eye exam   Esophageal varices without bleeding (HCC) Stable, cont corgard asd, f/u GI as planned  Followup: Return in about 6 months (around 05/03/2023) for follow up to [facility].  Jose Barre, MD 11/02/2022 8:23 PM [facility]

## 2022-11-02 ENCOUNTER — Encounter: Payer: Self-pay | Admitting: Internal Medicine

## 2022-11-02 DIAGNOSIS — Z0001 Encounter for general adult medical examination with abnormal findings: Secondary | ICD-10-CM | POA: Insufficient documentation

## 2022-11-02 NOTE — Assessment & Plan Note (Signed)
Age and sex appropriate education and counseling updated with regular exercise and diet Referrals for preventative services - for referral DM exam with optho Immunizations addressed  - for shignrix at pharmacy, declines covid booster Smoking counseling  - none needed Evidence for depression or other mood disorder - none significant Most recent labs reviewed. I have personally reviewed and have noted: 1) the patient's medical and social history 2) The patient's current medications and supplements 3) The patient's height, weight, and BMI have been recorded in the chart

## 2022-11-02 NOTE — Assessment & Plan Note (Signed)
Overall stable today, ok for change trelegy to wixhela inhaler asd

## 2022-11-02 NOTE — Assessment & Plan Note (Signed)
Examined today - laceration intact without s/s infection , for f/u ortho tomorrow for stitches out

## 2022-11-02 NOTE — Assessment & Plan Note (Signed)
Stable, cont corgard asd, f/u GI as planned

## 2022-11-02 NOTE — Assessment & Plan Note (Signed)
Lab Results  Component Value Date   HGBA1C 5.9 (A) 08/08/2022   Stable, pt to continue current medical treatment lantus 6 u every day, metformin 500 mg every day, and refer optho for DM eye exam

## 2022-11-04 ENCOUNTER — Telehealth: Payer: Self-pay

## 2022-11-04 NOTE — Telephone Encounter (Signed)
Pt already saw a specialist for issue he went to ED for and also seen Dr. Jonny Ruiz for a wellness visit

## 2022-11-05 ENCOUNTER — Other Ambulatory Visit: Payer: Self-pay

## 2022-11-05 ENCOUNTER — Other Ambulatory Visit (HOSPITAL_COMMUNITY): Payer: Self-pay

## 2022-11-21 ENCOUNTER — Other Ambulatory Visit (HOSPITAL_COMMUNITY): Payer: Self-pay

## 2022-11-22 ENCOUNTER — Other Ambulatory Visit (HOSPITAL_COMMUNITY): Payer: Self-pay

## 2022-11-29 ENCOUNTER — Other Ambulatory Visit (HOSPITAL_COMMUNITY): Payer: Self-pay

## 2022-12-02 ENCOUNTER — Other Ambulatory Visit: Payer: Self-pay

## 2022-12-02 ENCOUNTER — Telehealth: Payer: Self-pay

## 2022-12-02 ENCOUNTER — Other Ambulatory Visit (HOSPITAL_COMMUNITY): Payer: Self-pay

## 2022-12-02 NOTE — Telephone Encounter (Signed)
[facility] Patient Advocate Encounter   Patient has been approved for [employer] Advancing Access Patient Assistance Program for Descovy from 12/02/22 to 12/02/23.  This assistance will make the patient's copay $0.00.  I have spoken to the patient.  The billing information is as follows and has been shared with [facility].         Patient knows to call the office with questions or concerns.  Clearance Coots, CPhT Specialty Pharmacy Patient [facility] Phone: [phone] Fax: [phone] 12/02/2022 2:53 PM

## 2022-12-04 ENCOUNTER — Other Ambulatory Visit (HOSPITAL_BASED_OUTPATIENT_CLINIC_OR_DEPARTMENT_OTHER): Payer: Self-pay

## 2022-12-04 ENCOUNTER — Other Ambulatory Visit (HOSPITAL_COMMUNITY): Payer: Self-pay

## 2022-12-05 ENCOUNTER — Other Ambulatory Visit (HOSPITAL_BASED_OUTPATIENT_CLINIC_OR_DEPARTMENT_OTHER): Payer: Self-pay

## 2022-12-20 ENCOUNTER — Other Ambulatory Visit (HOSPITAL_COMMUNITY): Payer: Self-pay

## 2022-12-23 ENCOUNTER — Other Ambulatory Visit (HOSPITAL_COMMUNITY): Payer: Self-pay

## 2022-12-25 ENCOUNTER — Other Ambulatory Visit (HOSPITAL_COMMUNITY): Payer: Self-pay

## 2022-12-25 ENCOUNTER — Encounter (HOSPITAL_COMMUNITY): Payer: Self-pay

## 2023-01-08 ENCOUNTER — Other Ambulatory Visit: Payer: Self-pay

## 2023-01-13 ENCOUNTER — Other Ambulatory Visit (HOSPITAL_COMMUNITY): Payer: Self-pay

## 2023-01-13 ENCOUNTER — Other Ambulatory Visit (HOSPITAL_COMMUNITY): Payer: Self-pay | Admitting: Pharmacy Technician

## 2023-01-13 NOTE — Progress Notes (Signed)
Specialty Pharmacy Refill Coordination Note  Jose Snow is a 52 y.o. male contacted today regarding refills of specialty medication(s) Emtricitabine-Tenofovir Af  Spoke with Daughter   Patient requested [name] at [facility] at [place] date: 01/14/23   Medication will be filled on 01/14/23.

## 2023-01-14 ENCOUNTER — Other Ambulatory Visit (HOSPITAL_COMMUNITY): Payer: Self-pay

## 2023-01-30 ENCOUNTER — Other Ambulatory Visit: Payer: Self-pay

## 2023-02-13 ENCOUNTER — Ambulatory Visit: Payer: Commercial Managed Care - HMO | Admitting: Emergency Medicine

## 2023-02-17 ENCOUNTER — Other Ambulatory Visit (HOSPITAL_COMMUNITY): Payer: Self-pay

## 2023-02-17 ENCOUNTER — Other Ambulatory Visit: Payer: Self-pay

## 2023-02-19 ENCOUNTER — Other Ambulatory Visit (HOSPITAL_COMMUNITY): Payer: Self-pay

## 2023-02-19 ENCOUNTER — Other Ambulatory Visit: Payer: Self-pay

## 2023-02-19 NOTE — Progress Notes (Signed)
Specialty Pharmacy Refill Coordination Note  Jose Snow is a 52 y.o. male contacted today regarding refills of specialty medication(s) Emtricitabine-Tenofovir Af   Patient requested [name] at [facility] at [place] date: 02/19/23   Medication will be filled on 02/19/23.

## 2023-03-11 ENCOUNTER — Other Ambulatory Visit: Payer: Self-pay

## 2023-03-14 ENCOUNTER — Other Ambulatory Visit: Payer: Self-pay

## 2023-03-14 ENCOUNTER — Telehealth: Payer: Self-pay

## 2023-03-14 DIAGNOSIS — B1910 Unspecified viral hepatitis B without hepatic coma: Secondary | ICD-10-CM

## 2023-03-14 NOTE — Telephone Encounter (Signed)
-----   Message from Nurse Joselyn Glassman sent at 09/03/2022  1:02 PM EDT ----- Personal reminder sent 09/03/2022  He needs a repeat ultrasound in 6 months:

## 2023-03-14 NOTE — Telephone Encounter (Signed)
Reminder received in Epic.  Pt was scheduled for the [place] on 03/20/2023 at 8:00 AM at [facility]. Pt to arrive at 7:45 AM.  Nothing to eat or drink past midnight. Pt daughter Gustavus Bryant made aware. Liem  verbalized understanding with all questions answered.

## 2023-03-17 ENCOUNTER — Other Ambulatory Visit (HOSPITAL_COMMUNITY): Payer: Self-pay

## 2023-03-20 ENCOUNTER — Ambulatory Visit (HOSPITAL_COMMUNITY)
Admission: RE | Admit: 2023-03-20 | Discharge: 2023-03-20 | Disposition: A | Discharge status: Home / Self Care | Payer: Self-pay | Source: Ambulatory Visit | Attending: Internal Medicine | Admitting: Internal Medicine

## 2023-03-20 DIAGNOSIS — K7460 Unspecified cirrhosis of liver: Secondary | ICD-10-CM | POA: Insufficient documentation

## 2023-03-20 DIAGNOSIS — B1910 Unspecified viral hepatitis B without hepatic coma: Secondary | ICD-10-CM | POA: Insufficient documentation

## 2023-03-23 ENCOUNTER — Other Ambulatory Visit: Payer: Self-pay | Admitting: Internal Medicine

## 2023-03-23 DIAGNOSIS — B1910 Unspecified viral hepatitis B without hepatic coma: Secondary | ICD-10-CM

## 2023-03-24 ENCOUNTER — Other Ambulatory Visit (INDEPENDENT_AMBULATORY_CARE_PROVIDER_SITE_OTHER): Payer: Self-pay

## 2023-03-24 ENCOUNTER — Other Ambulatory Visit: Payer: Self-pay

## 2023-03-24 ENCOUNTER — Other Ambulatory Visit (HOSPITAL_COMMUNITY): Payer: Self-pay

## 2023-03-24 DIAGNOSIS — K7460 Unspecified cirrhosis of liver: Secondary | ICD-10-CM

## 2023-03-24 DIAGNOSIS — B1910 Unspecified viral hepatitis B without hepatic coma: Secondary | ICD-10-CM

## 2023-03-24 LAB — CBC WITH DIFFERENTIAL/PLATELET
Basophils Absolute: 0 10*3/uL (ref 0.0–0.1)
Basophils Relative: 0.5 % (ref 0.0–3.0)
Eosinophils Absolute: 0.1 10*3/uL (ref 0.0–0.7)
Eosinophils Relative: 4.6 % (ref 0.0–5.0)
HCT: 34.8 % — ABNORMAL LOW (ref 39.0–52.0)
Hemoglobin: 11.8 g/dL — ABNORMAL LOW (ref 13.0–17.0)
Lymphocytes Relative: 35.2 % (ref 12.0–46.0)
Lymphs Abs: 0.7 10*3/uL (ref 0.7–4.0)
MCHC: 33.9 g/dL (ref 30.0–36.0)
MCV: 92.2 fL (ref 78.0–100.0)
Monocytes Absolute: 0.1 10*3/uL (ref 0.1–1.0)
Monocytes Relative: 6.6 % (ref 3.0–12.0)
Neutro Abs: 1.1 10*3/uL — ABNORMAL LOW (ref 1.4–7.7)
Neutrophils Relative %: 53.1 % (ref 43.0–77.0)
Platelets: 27 10*3/uL — CL (ref 150.0–400.0)
RBC: 3.77 Mil/uL — ABNORMAL LOW (ref 4.22–5.81)
RDW: 15.2 % (ref 11.5–15.5)
WBC: 2 10*3/uL — ABNORMAL LOW (ref 4.0–10.5)

## 2023-03-24 LAB — COMPREHENSIVE METABOLIC PANEL
ALT: 18 U/L (ref 0–53)
AST: 25 U/L (ref 0–37)
Albumin: 3.5 g/dL (ref 3.5–5.2)
Alkaline Phosphatase: 59 U/L (ref 39–117)
BUN: 21 mg/dL (ref 6–23)
CO2: 27 meq/L (ref 19–32)
Calcium: 8.5 mg/dL (ref 8.4–10.5)
Chloride: 103 meq/L (ref 96–112)
Creatinine, Ser: 1.16 mg/dL (ref 0.40–1.50)
GFR: 71.18 mL/min (ref >60.00)
Glucose, Bld: 401 mg/dL — ABNORMAL HIGH (ref 70–99)
Potassium: 4.3 meq/L (ref 3.5–5.1)
Sodium: 135 meq/L (ref 135–145)
Total Bilirubin: 1.8 mg/dL — ABNORMAL HIGH (ref 0.2–1.2)
Total Protein: 6.9 g/dL (ref 6.0–8.3)

## 2023-03-24 LAB — PROTIME-INR
INR: 1.4 {ratio} — ABNORMAL HIGH (ref 0.8–1.0)
Prothrombin Time: 15 s — ABNORMAL HIGH (ref 9.6–13.1)

## 2023-03-24 NOTE — Progress Notes (Signed)
Specialty Pharmacy Refill Coordination Note  Jose Snow is a 52 y.o. male who's daughter contacted the pharmacy today regarding refills of specialty medication(s) Emtricitabine-Tenofovir AF (Descovy)   Patient requested Jose Snow at [facility] at [place] date: 03/24/23   Medication will be filled on 03/24/23.

## 2023-03-26 LAB — HEPATITIS B DNA, ULTRAQUANTITATIVE, PCR
Hepatitis B DNA: 82 [IU]/mL — ABNORMAL HIGH
Hepatitis B virus DNA: 1.92 {Log} — ABNORMAL HIGH

## 2023-03-26 LAB — AFP TUMOR MARKER: AFP-Tumor Marker: 6.3 ng/mL — ABNORMAL HIGH (ref <6.1)

## 2023-04-08 ENCOUNTER — Other Ambulatory Visit (HOSPITAL_COMMUNITY): Payer: Self-pay

## 2023-04-10 ENCOUNTER — Other Ambulatory Visit: Payer: Self-pay

## 2023-04-14 ENCOUNTER — Other Ambulatory Visit: Payer: Self-pay

## 2023-04-28 ENCOUNTER — Encounter: Payer: Self-pay | Admitting: Family

## 2023-04-28 ENCOUNTER — Other Ambulatory Visit: Payer: Self-pay

## 2023-04-28 ENCOUNTER — Ambulatory Visit (INDEPENDENT_AMBULATORY_CARE_PROVIDER_SITE_OTHER): Payer: Self-pay | Admitting: Family

## 2023-04-28 ENCOUNTER — Other Ambulatory Visit (HOSPITAL_COMMUNITY): Payer: Self-pay

## 2023-04-28 VITALS — BP 129/81 | HR 70 | Temp 97.9°F | Wt 125.0 lb

## 2023-04-28 DIAGNOSIS — B181 Chronic viral hepatitis B without delta-agent: Secondary | ICD-10-CM

## 2023-04-28 DIAGNOSIS — B1910 Unspecified viral hepatitis B without hepatic coma: Secondary | ICD-10-CM

## 2023-04-28 DIAGNOSIS — K7460 Unspecified cirrhosis of liver: Secondary | ICD-10-CM

## 2023-04-28 NOTE — Patient Instructions (Signed)
Nice to see you. ° °Continue to take your medication daily as prescribed. ° °Refills have been sent to the pharmacy. ° °Plan for follow up in 6 months or sooner if needed with lab work on the same day. ° °Have a great day and stay safe! ° °

## 2023-04-28 NOTE — Assessment & Plan Note (Addendum)
Cirrhosis with no focal lesions on ultrasound with mildly elevated AFP marker. Continue to follow up with Gastroenterology for cirrhosis management.

## 2023-04-28 NOTE — Assessment & Plan Note (Signed)
Mr. Rutigliano has improved viral control of his Hepatitis B with good adherence and tolerance to Descovy. Reviewed lab work and imaging. Educated about importance of taking medication as prescribed to suppress virus. Sample of Descovy provided. Continue current dose of Descovy indefinitely. Plan for follow up in 6 months or sooner if needed.

## 2023-04-28 NOTE — Progress Notes (Signed)
Subjective:    Patient ID: Jose Snow, male    DOB: [birth year], 53 y.o.   MRN: [id number]  Chief Complaint  Patient presents with   Follow-up   Hepatitis B    HPI:  Jose Snow is a 53 y.o. 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  Hepatitis B DNA level 6 million with AST 24 and ALT 33.  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  Most recent lab work completed on 03/24/2023 with hepatitis B DNA level of 82 and AST 25 with ALT 18.  Kidney function within normal ranges.  Continued thrombocytopenia with platelet level of 27,000. Ultrasound with cirrhosis and no focal lesions with AFP tumor marker slightly elevated. Here today for follow-up.  Jose Snow primary preferred language is Montagnard and a medical interpreter is present to aid in communication.  Has been taking his Descovy as prescribed with no adverse side effects.  Ran out of medication approximately 2 days ago and planning on obtaining his next refill tomorrow.  Overall feeling okay.  No sign/symptoms of flare. Denies abdominal pain, nausea, vomiting, fatigue, fever, scleral icterus or jaundice.   Allergies  Allergen Reactions   Other     Yellow cough medicine makes him dizzy      Outpatient Medications Prior to Visit  Medication Sig Dispense Refill   albuterol (VENTOLIN HFA) 108 (90 Base) MCG/ACT inhaler Inhale 1-2 puffs into the lungs every 4 (four) hours as needed for wheezing or shortness of breath. 1 each 6   emtricitabine-tenofovir AF (DESCOVY) 200-25 MG tablet Take 1 tablet by mouth daily. 30 tablet 11   insulin glargine (LANTUS) 100 UNIT/ML injection Inject 6 Units into the skin daily.      metFORMIN (GLUCOPHAGE) 500 MG tablet Take 1 tablet (500 mg total) by mouth daily. 90 tablet 3   nadolol (CORGARD) 20 MG tablet TAKE 1 TABLET(20 MG) BY  MOUTH DAILY 90 tablet 3   omeprazole (PRILOSEC) 40 MG capsule TAKE 1 CAPSULE BY MOUTH DAILY BEFORE BREAKFAST 90 capsule 3   B-D UF III MINI PEN NEEDLES 31G X 5 MM MISC daily. use as directed     cephALEXin (KEFLEX) 500 MG capsule Take 1 capsule (500 mg total) by mouth 4 (four) times daily. (Patient not taking: Reported on 04/28/2023) 20 capsule 0   fluticasone-salmeterol (WIXELA INHUB) 250-50 MCG/ACT AEPB Inhale 1 puff into the lungs in the morning and at bedtime. 1 each 11   Fluticasone-Umeclidin-Vilant (TRELEGY ELLIPTA) 100-62.5-25 MCG/ACT AEPB Inhale 1 puff into the lungs daily. 1 each 11   Lancets (ONETOUCH DELICA PLUS LANCET33G) MISC Apply topically.     No facility-administered medications prior to visit.     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      Past Surgical History:  Procedure Laterality Date   BIOPSY  10/23/2017   Procedure: BIOPSY;  Surgeon: Rachael Fee, MD;  Location: [facility] ENDOSCOPY;  Service: Endoscopy;;   BIOPSY  03/03/2018   Procedure: BIOPSY;  Surgeon: Lemar Lofty., MD;  Location: [facility] ENDOSCOPY;  Service: Gastroenterology;;   COLONOSCOPY WITH PROPOFOL N/A 10/23/2017  Procedure: COLONOSCOPY WITH PROPOFOL;  Surgeon: Rachael Fee, MD;  Location: [facility];  Service: Endoscopy;  Laterality: N/A;   COMPLEX WOUND CLOSURE Right 12/15/2015   Procedure: COMPLEX WOUND CLOSURE;  Surgeon: Betha Loa, MD;  Location: [facility] OR;  Service: Orthopedics;  Laterality: Right;   ESOPHAGOGASTRODUODENOSCOPY (EGD) WITH PROPOFOL N/A 10/23/2017   Procedure: ESOPHAGOGASTRODUODENOSCOPY (EGD) WITH PROPOFOL;  Surgeon: Rachael Fee, MD;  Location: [facility];  Service: Endoscopy;  Laterality: N/A;   ESOPHAGOGASTRODUODENOSCOPY (EGD) WITH PROPOFOL N/A 03/03/2018    Procedure: ESOPHAGOGASTRODUODENOSCOPY (EGD) WITH PROPOFOL;  Surgeon: Meridee Score Netty Starring., MD;  Location: [facility] ENDOSCOPY;  Service: Gastroenterology;  Laterality: N/A;   I & D EXTREMITY Right 12/15/2015   Procedure: IRRIGATION AND DEBRIDEMENT AND REVISION AMPUTATION RIGHT RING FINGER;  Surgeon: Betha Loa, MD;  Location: [facility] OR;  Service: Orthopedics;  Laterality: Right;       Review of Systems  Constitutional:  Negative for chills, diaphoresis, fatigue and fever.  Respiratory:  Negative for cough, chest tightness, shortness of breath and wheezing.   Cardiovascular:  Negative for chest pain.  Gastrointestinal:  Negative for abdominal pain, diarrhea, nausea and vomiting.      Objective:    BP 129/81   Pulse 70   Temp 97.9 F (36.6 C) (Temporal)   Wt 125 lb (56.7 kg)   SpO2 97%   BMI 21.46 kg/m  Nursing note and vital signs reviewed.  Physical Exam Constitutional:      General: He is not in acute distress.    Appearance: He is well-developed.  Cardiovascular:     Rate and Rhythm: Normal rate and regular rhythm.     Heart sounds: Normal heart sounds.  Pulmonary:     Effort: Pulmonary effort is normal.     Breath sounds: Normal breath sounds.  Abdominal:     General: Bowel sounds are normal. There is no distension.     Palpations: Abdomen is soft. There is no mass.     Tenderness: There is no abdominal tenderness. There is no guarding or rebound.  Skin:    General: Skin is warm and dry.  Neurological:     Mental Status: He is alert and oriented to person, place, and time.  Psychiatric:        Mood and Affect: Mood normal.         04/28/2023    3:52 PM 10/14/2022    4:01 PM 08/08/2022    2:12 PM 04/10/2022    3:47 PM 02/11/2022    3:51 PM  Depression screen PHQ 2/9  Decreased Interest 0 0 0 0 0  Down, Depressed, Hopeless 0 0 0 0 0  PHQ - 2 Score 0 0 0 0 0       Assessment & Plan:    Patient Active Problem List   Diagnosis Date Noted   Hepatitis B 02/17/2015     Priority: High   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) 11/25/2014    Priority: High   Encounter for well adult exam with abnormal findings 11/02/2022   Finger laceration 10/30/2022   Chronic venous insufficiency 08/08/2022   Hepatoma (HCC) 01/31/2022   Moderate COPD (chronic obstructive pulmonary disease) (HCC) 01/31/2022   Liver cirrhosis secondary to NASH (HCC)    Chronic cough 12/08/2019   Pancytopenia (HCC) 12/03/2017   Abnormal echocardiogram 12/03/2017   History of hepatitis B 12/03/2017   Type 2 diabetes mellitus with other specified complication, without long-term current use of insulin (HCC)  Esophageal varices without bleeding (HCC)    Heart murmur 08/01/2017   Thrombocytopenia (HCC) 11/25/2014   History of TB (tuberculosis) 07/16/2010     Problem List Items Addressed This Visit       Digestive   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) - Primary   Cirrhosis with no focal lesions on ultrasound with mildly elevated AFP marker. Continue to follow up with Gastroenterology for cirrhosis management.       Hepatitis B   Jose Snow has improved viral control of his Hepatitis B with good adherence and tolerance to Descovy. Reviewed lab work and imaging. Educated about importance of taking medication as prescribed to suppress virus. Sample of Descovy provided. Continue current dose of Descovy indefinitely. Plan for follow up in 6 months or sooner if needed.         I am having Jose Snow maintain his insulin glargine, B-D UF III MINI PEN NEEDLES, OneTouch Delica Plus Lancet33G, albuterol, metFORMIN, omeprazole, nadolol, Trelegy Ellipta, Descovy, cephALEXin, and fluticasone-salmeterol.    Follow-up: Return in about 6 months (around 10/26/2023). or sooner if needed.   Marcos Eke, MSN, FNP-C Nurse Practitioner [facility] Main number: [phone]

## 2023-04-28 NOTE — Progress Notes (Signed)
Specialty Pharmacy Refill Coordination Note  Jose Snow is a 53 y.o. male contacted today regarding refills of specialty medication(s) Emtricitabine-Tenofovir AF (Descovy)   Patient requested [name] at [facility] at [place] date: 04/29/23   Medication will be filled on 04/29/23.

## 2023-04-29 ENCOUNTER — Other Ambulatory Visit (HOSPITAL_COMMUNITY): Payer: Self-pay

## 2023-04-29 ENCOUNTER — Other Ambulatory Visit: Payer: Self-pay

## 2023-04-30 ENCOUNTER — Other Ambulatory Visit (HOSPITAL_COMMUNITY): Payer: Self-pay

## 2023-04-30 ENCOUNTER — Other Ambulatory Visit: Payer: Self-pay | Admitting: Pharmacist

## 2023-04-30 DIAGNOSIS — B181 Chronic viral hepatitis B without delta-agent: Secondary | ICD-10-CM

## 2023-04-30 MED ORDER — DESCOVY 200-25 MG PO TABS: 1.0000 | ORAL_TABLET | Freq: Every day | ORAL | Status: AC

## 2023-04-30 NOTE — Progress Notes (Signed)
Medication Samples have been provided to the patient.  Drug name: Descovy        Strength: 200/25 mg       Qty: 1 pack (7 tablets)   LOT: [id number]   Exp.Date: 12/2024  Samples requested by Marcos Eke, NP.  Dosing instructions: Take one tablet by mouth once daily  The patient has been instructed regarding the correct time, dose, and frequency of taking this medication, including desired effects and most common side effects.   Kurstin Dimarzo L. Jannette Fogo, PharmD, BCIDP, AAHIVP, CPP Clinical Pharmacist Practitioner Infectious Diseases Clinical Pharmacist [facility] 03/20/2020, 10:07 AM

## 2023-05-02 ENCOUNTER — Other Ambulatory Visit: Payer: Self-pay | Admitting: Pharmacist

## 2023-05-02 NOTE — Progress Notes (Signed)
Specialty Pharmacy Ongoing Clinical Assessment Note  Jose Snow is a 53 y.o. male who is being followed by the specialty pharmacy service for RxSp Hepatitis B   Patient's specialty medication(s) reviewed today: Emtricitabine-Tenofovir AF (Descovy)   Missed doses in the last 4 weeks: 1-2   Patient/Caregiver did not have any additional questions or concerns.   Therapeutic benefit summary: Patient is achieving benefit   Adverse events/side effects summary: No adverse events/side effects   Patient's therapy is appropriate to: Continue    Goals Addressed             This Visit's Progress    Achieve sustained HBV viral load suppression       Patient is on track. Patient will maintain adherence      Maintain optimal adherence to therapy       Patient is on track. Patient will maintain adherencea         Follow up:  6 months  Jennette Kettle Specialty Pharmacist

## 2023-05-14 ENCOUNTER — Other Ambulatory Visit (HOSPITAL_COMMUNITY): Payer: Self-pay

## 2023-05-19 ENCOUNTER — Other Ambulatory Visit (HOSPITAL_COMMUNITY): Payer: Self-pay

## 2023-05-23 ENCOUNTER — Other Ambulatory Visit (HOSPITAL_COMMUNITY): Payer: Self-pay

## 2023-05-26 ENCOUNTER — Ambulatory Visit (INDEPENDENT_AMBULATORY_CARE_PROVIDER_SITE_OTHER): Payer: Self-pay

## 2023-05-26 ENCOUNTER — Ambulatory Visit (HOSPITAL_COMMUNITY)
Admission: EM | Admit: 2023-05-26 | Discharge: 2023-05-26 | Disposition: A | Discharge status: Home / Self Care | Payer: Self-pay | Attending: Emergency Medicine | Admitting: Emergency Medicine

## 2023-05-26 ENCOUNTER — Encounter (HOSPITAL_COMMUNITY): Payer: Self-pay | Admitting: Emergency Medicine

## 2023-05-26 ENCOUNTER — Other Ambulatory Visit: Payer: Self-pay

## 2023-05-26 DIAGNOSIS — R053 Chronic cough: Secondary | ICD-10-CM

## 2023-05-26 DIAGNOSIS — J069 Acute upper respiratory infection, unspecified: Secondary | ICD-10-CM

## 2023-05-26 MED ORDER — PROMETHAZINE-DM 6.25-15 MG/5ML PO SYRP: 5.0000 mL | ORAL_SOLUTION | Freq: Four times a day (QID) | ORAL | 0 refills | Status: AC | PRN

## 2023-05-26 MED ORDER — BENZONATATE 100 MG PO CAPS: 100.0000 mg | ORAL_CAPSULE | Freq: Three times a day (TID) | ORAL | 0 refills | Status: DC | PRN

## 2023-05-26 NOTE — ED Triage Notes (Signed)
Left ear pain, fever, cough, chest congestion and pain with cough.  Symptoms for 4-5 days.  Ear pain for a month.    Patient has not had medications for symptoms   Patient's daughter is translating.

## 2023-05-26 NOTE — Progress Notes (Signed)
Specialty Pharmacy Refill Coordination Note  Jose Snow is a 53 y.o. male contacted today regarding refills of specialty medication(s) Emtricitabine-Tenofovir AF (Descovy)   Patient requested Delivery   Delivery date: 05/28/23   Verified address: [address]   Medication will be filled on 05/27/23.

## 2023-05-26 NOTE — Discharge Instructions (Addendum)
Someone will contact you tomorrow if there is any abnormality noted on the x-ray by radiology. As of now, I do not see any changes since imaging done last year.  The tessalon cough pills can be taken 3x daily. The promethazine DM cough syrup can be used up to 4 times daily. If this medication makes you drowsy, take only once before bed.  Please let primary care provider know about your symptoms at your visit tomorrow Please go to the emergency department if symptoms worsen.

## 2023-05-26 NOTE — ED Provider Notes (Addendum)
[facility]    CSN: [id number] Arrival date & time: 05/26/23  1727      History   Chief Complaint Chief Complaint  Patient presents with   Cough    HPI Jose Snow is a 53 y.o. male.  Patient's daughter interprets per request About 4 or 5 days of tactile fever, nasal congestion, productive cough Reports chest discomfort with cough.  Not at rest.  No shortness of breath or wheezing. Also reports about a month of left ear pain  Took tylenol this morning, and OTC cough med once  History of TB treated in 2002 COPD  Has a PCP appointment tomorrow  Past Medical History:  Diagnosis Date   Cirrhosis (HCC)    Diabetes mellitus (HCC)    Hepatitis B    Liver cirrhosis secondary to NASH (HCC)    Lymphocytic colitis    Pancytopenia (HCC)    TB (tuberculosis)    treated 15 months at health dept[facility]     Patient Active Problem List   Diagnosis Date Noted   Encounter for well adult exam with abnormal findings 11/02/2022   Finger laceration 10/30/2022   Chronic venous insufficiency 08/08/2022   Hepatoma (HCC) 01/31/2022   Moderate COPD (chronic obstructive pulmonary disease) (HCC) 01/31/2022   Liver cirrhosis secondary to NASH (HCC)    Chronic cough 12/08/2019   Pancytopenia (HCC) 12/03/2017   Abnormal echocardiogram 12/03/2017   History of hepatitis B 12/03/2017   Type 2 diabetes mellitus with other specified complication, without long-term current use of insulin (HCC)    Esophageal varices without bleeding (HCC)    Heart murmur 08/01/2017   Hepatitis B 02/17/2015   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) 11/25/2014   Thrombocytopenia (HCC) 11/25/2014   History of TB (tuberculosis) 07/16/2010    Past Surgical History:  Procedure Laterality Date   BIOPSY  10/23/2017   Procedure: BIOPSY;  Surgeon: Rachael Fee, MD;  Location: [facility] ENDOSCOPY;  Service: Endoscopy;;   BIOPSY  03/03/2018   Procedure: BIOPSY;  Surgeon: Lemar Lofty., MD;  Location:  [facility] ENDOSCOPY;  Service: Gastroenterology;;   COLONOSCOPY WITH PROPOFOL N/A 10/23/2017   Procedure: COLONOSCOPY WITH PROPOFOL;  Surgeon: Rachael Fee, MD;  Location: [facility]OSCOPY;  Service: Endoscopy;  Laterality: N/A;   COMPLEX WOUND CLOSURE Right 12/15/2015   Procedure: COMPLEX WOUND CLOSURE;  Surgeon: Betha Loa, MD;  Location: [facility] OR;  Service: Orthopedics;  Laterality: Right;   ESOPHAGOGASTRODUODENOSCOPY (EGD) WITH PROPOFOL N/A 10/23/2017   Procedure: ESOPHAGOGASTRODUODENOSCOPY (EGD) WITH PROPOFOL;  Surgeon: Rachael Fee, MD;  Location: [facility] ENDOSCOPY;  Service: Endoscopy;  Laterality: N/A;   ESOPHAGOGASTRODUODENOSCOPY (EGD) WITH PROPOFOL N/A 03/03/2018   Procedure: ESOPHAGOGASTRODUODENOSCOPY (EGD) WITH PROPOFOL;  Surgeon: Meridee Score Netty Starring., MD;  Location: [facility] ENDOSCOPY;  Service: Gastroenterology;  Laterality: N/A;   I & D EXTREMITY Right 12/15/2015   Procedure: IRRIGATION AND DEBRIDEMENT AND REVISION AMPUTATION RIGHT RING FINGER;  Surgeon: Betha Loa, MD;  Location: [facility] OR;  Service: Orthopedics;  Laterality: Right;       Home Medications    Prior to Admission medications   Medication Sig Start Date End Date Taking? Authorizing Provider  benzonatate (TESSALON) 100 MG capsule Take 1 capsule (100 mg total) by mouth 3 (three) times daily as needed for cough. 05/26/23  Yes Vicke Plotner, Lurena Joiner, PA-C  promethazine-dextromethorphan (PROMETHAZINE-DM) 6.25-15 MG/5ML syrup Take 5 mLs by mouth 4 (four) times daily as needed for cough. 05/26/23  Yes Lajuanna Pompa, Lurena Joiner, PA-C  albuterol (VENTOLIN HFA) 108 (  90 Base) MCG/ACT inhaler Inhale 1-2 puffs into the lungs every 4 (four) hours as needed for wheezing or shortness of breath. Patient not taking: Reported on 05/26/2023 01/31/22 04/28/23  Georgina Quint, MD  B-D UF III MINI PEN NEEDLES 31G X 5 MM MISC daily. use as directed 06/21/19   [provider]  emtricitabine-tenofovir AF (DESCOVY) 200-25 MG tablet Take 1 tablet by mouth daily.  10/03/22   Veryl Speak, FNP  fluticasone-salmeterol (WIXELA INHUB) 250-50 MCG/ACT AEPB Inhale 1 puff into the lungs in the morning and at bedtime. Patient not taking: Reported on 05/26/2023 10/31/22   Corwin Levins, MD  Fluticasone-Umeclidin-Vilant (TRELEGY ELLIPTA) 100-62.5-25 MCG/ACT AEPB Inhale 1 puff into the lungs daily. Patient not taking: Reported on 05/26/2023 08/14/22   Georgina Quint, MD  insulin glargine (LANTUS) 100 UNIT/ML injection Inject 6 Units into the skin daily.     [provider]  Lancets [facility] DELICA PLUS [place]) MISC Apply topically. 09/06/20   [provider]  metFORMIN (GLUCOPHAGE) 500 MG tablet Take 1 tablet (500 mg total) by mouth daily. 08/08/22 08/03/23  Georgina Quint, MD  nadolol (CORGARD) 20 MG tablet TAKE 1 TABLET(20 MG) BY MOUTH DAILY 08/13/22   Iva Boop, MD  omeprazole (PRILOSEC) 40 MG capsule TAKE 1 CAPSULE BY MOUTH DAILY BEFORE BREAKFAST 08/13/22   Iva Boop, MD    Family History Family History  Problem Relation Age of Onset   Colon cancer Neg Hx    Esophageal cancer Neg Hx    Pancreatic cancer Neg Hx    Stomach cancer Neg Hx    Liver disease Neg Hx     Social History Social History   Tobacco Use   Smoking status: Former    Current packs/day: 0.00    Average packs/day: 0.5 packs/day for 8.0 years (4.0 ttl pk-yrs)    Types: Cigarettes    Start date: 04/08/1982    Quit date: 04/08/1990    Years since quitting: 33.1   Smokeless tobacco: Never  Vaping Use   Vaping status: Never Used  Substance Use Topics   Alcohol use: No   Drug use: No     Allergies   Other   Review of Systems Review of Systems Per HPI  Physical Exam Triage Vital Signs ED Triage Vitals  Encounter Vitals Group     BP      Systolic BP Percentile      Diastolic BP Percentile      Pulse      Resp      Temp      Temp src      SpO2      Weight      Height      Head Circumference      Peak Flow      Pain Score       Pain Loc      Pain Education      Exclude from Growth Chart    No data found.  Updated Vital Signs BP 113/71 (BP Location: Right Arm)   Pulse 89   Temp 99.6 F (37.6 C) (Oral)   Resp 20   SpO2 95%     Physical Exam Vitals and nursing note reviewed.  Constitutional:      Appearance: He is not ill-appearing.  HENT:     Right Ear: Tympanic membrane, ear canal and external ear normal.     Left Ear: Tympanic membrane, ear canal and external ear normal.  Nose: No congestion or rhinorrhea.     Mouth/Throat:     Mouth: Mucous membranes are moist.     Pharynx: Oropharynx is clear. No posterior oropharyngeal erythema.  Eyes:     Conjunctiva/sclera: Conjunctivae normal.  Cardiovascular:     Rate and Rhythm: Normal rate and regular rhythm.     Pulses: Normal pulses.     Heart sounds: Normal heart sounds.  Pulmonary:     Effort: Pulmonary effort is normal.     Breath sounds: Normal breath sounds.     Comments: Coarse sounds in right mid to lower lobe Musculoskeletal:     Cervical back: Normal range of motion.  Lymphadenopathy:     Cervical: No cervical adenopathy.  Skin:    General: Skin is warm and dry.  Neurological:     Mental Status: He is alert and oriented to person, place, and time.     UC Treatments / Results  Labs (all labs ordered are listed, but only abnormal results are displayed) Labs Reviewed - No data to display  EKG  Radiology DG Chest 2 View Result Date: 05/26/2023 CLINICAL DATA:  Cough.  History of tuberculosis and COPD. EXAM: CHEST - 2 VIEW COMPARISON:  05/04/2021 FINDINGS: Patient rotated to the left. Mild cardiomegaly. Trace left pleural thickening blunts the costophrenic angle, similar. No pneumothorax. Left apical pleural thickening. Similar appearance of left significantly greater than right parenchymal scarring, most significant in the retrocardiac left lower lobe. No convincing evidence of acute superimposed consolidation. IMPRESSION: Similar  primarily left-sided pleuroparenchymal scarring and volume loss, without convincing evidence of acute superimposed process. Mild cardiomegaly. Electronically Signed   By: Jeronimo Greaves M.D.   On: 05/26/2023 20:48    Procedures Procedures   Medications Ordered in UC Medications - No data to display  Initial Impression / Assessment and Plan / UC Course  I have reviewed the triage vital signs and the nursing notes.  Pertinent labs & imaging results that were available during my care of the patient were reviewed by me and considered in my medical decision making (see chart for details).  Sating 95% room air, afebrile currently Chest xray preliminary read by this provider without acute change; compared with reading from January 2023. There are some areas of scarring that are unchanged. Awaiting radiology final read; patient and daughter understand someone will contact them tomorrow if there is anything different requiring treatment. For now recommend supportive care. Can try both tessalon and promethazine PCP appointment is tomorrow. Advised discuss symptoms  ED precautions any acute worsening    Addendum: radiology final read without new abnormality   Final Clinical Impressions(s) / UC Diagnoses   Final diagnoses:  Chronic cough  Viral URI with cough     Discharge Instructions      Someone will contact you tomorrow if there is any abnormality noted on the x-ray by radiology. As of now, I do not see any changes since imaging done last year.  The tessalon cough pills can be taken 3x daily. The promethazine DM cough syrup can be used up to 4 times daily. If this medication makes you drowsy, take only once before bed.  Please let primary care provider know about your symptoms at your visit tomorrow Please go to the emergency department if symptoms worsen.     ED Prescriptions     Medication Sig Dispense Auth. Provider   benzonatate (TESSALON) 100 MG capsule Take 1 capsule (100  mg total) by mouth 3 (three) times daily as  needed for cough. 30 capsule Izabell Schalk, PA-C   promethazine-dextromethorphan (PROMETHAZINE-DM) 6.25-15 MG/5ML syrup Take 5 mLs by mouth 4 (four) times daily as needed for cough. 240 mL Thea Holshouser, Lurena Joiner, PA-C      PDMP not reviewed this encounter.   Jahlani Lorentz, Ray Church 05/26/23 2026    Chen Holzman, Lurena Joiner, PA-C 05/26/23 2101

## 2023-05-27 ENCOUNTER — Encounter: Payer: Self-pay | Admitting: Emergency Medicine

## 2023-05-27 ENCOUNTER — Ambulatory Visit (INDEPENDENT_AMBULATORY_CARE_PROVIDER_SITE_OTHER): Payer: Self-pay | Admitting: Emergency Medicine

## 2023-05-27 ENCOUNTER — Other Ambulatory Visit: Payer: Self-pay

## 2023-05-27 VITALS — BP 126/64 | HR 98 | Temp 99.6°F | Ht 64.0 in | Wt 123.0 lb

## 2023-05-27 DIAGNOSIS — J449 Chronic obstructive pulmonary disease, unspecified: Secondary | ICD-10-CM

## 2023-05-27 DIAGNOSIS — J22 Unspecified acute lower respiratory infection: Secondary | ICD-10-CM | POA: Insufficient documentation

## 2023-05-27 MED ORDER — AZITHROMYCIN 250 MG PO TABS: ORAL_TABLET | ORAL | 0 refills | Status: DC

## 2023-05-27 MED ORDER — TRELEGY ELLIPTA 100-62.5-25 MCG/ACT IN AEPB: 1.0000 | INHALATION_SPRAY | Freq: Every day | RESPIRATORY_TRACT | 11 refills | Status: AC

## 2023-05-27 MED ORDER — ALBUTEROL SULFATE HFA 108 (90 BASE) MCG/ACT IN AERS: 1.0000 | INHALATION_SPRAY | RESPIRATORY_TRACT | 6 refills | Status: AC | PRN

## 2023-05-27 NOTE — Patient Instructions (Signed)
 Acute Bronchitis, Adult  Acute bronchitis is when air tubes in the lungs (bronchi) suddenly get swollen. The condition can make it hard for you to breathe. In adults, acute bronchitis usually goes away within 2 weeks. A cough caused by bronchitis may last up to 3 weeks. Smoking, allergies, and asthma can make the condition worse. What are the causes? Germs that cause cold and flu (viruses). The most common cause of this condition is the virus that causes the common cold. Bacteria. Substances that bother (irritate) the lungs, including: Smoke from cigarettes and other types of tobacco. Dust and pollen. Fumes from chemicals, gases, or burned fuel. Indoor or outdoor air pollution. What increases the risk? A weak body's defense system. This is also called the immune system. Any condition that affects your lungs and breathing, such as asthma. What are the signs or symptoms? A cough. Coughing up clear, yellow, or green mucus. Making high-pitched whistling sounds when you breathe, most often when you breathe out (wheezing). Runny or stuffy nose. Having too much mucus in your lungs (chest congestion). Shortness of breath. Body aches. A sore throat. How is this treated? Acute bronchitis may go away over time without treatment. Your doctor may tell you to: Drink more fluids. This will help thin your mucus so it is easier to cough up. Use a device that gets medicine into your lungs (inhaler). Use a vaporizer or a humidifier. These are machines that add water to the air. This helps with coughing and poor breathing. Take a medicine that thins mucus and helps clear it from your lungs. Take a medicine that prevents or stops coughing. It is not common to take an antibiotic medicine for this condition. Follow these instructions at home:  Take over-the-counter and prescription medicines only as told by your doctor. Use an inhaler, vaporizer, or humidifier as told by your doctor. Take two teaspoons  (10 mL) of honey at bedtime. This helps lessen your coughing at night. Drink enough fluid to keep your pee (urine) pale yellow. Do not smoke or use any products that contain nicotine or tobacco. If you need help quitting, ask your doctor. Get a lot of rest. Return to your normal activities when your doctor says that it is safe. Keep all follow-up visits. How is this prevented?  Wash your hands often with soap and water for at least 20 seconds. If you cannot use soap and water, use hand sanitizer. Avoid contact with people who have cold symptoms. Try not to touch your mouth, nose, or eyes with your hands. Avoid breathing in smoke or chemical fumes. Make sure to get the flu shot every year. Contact a doctor if: Your symptoms do not get better in 2 weeks. You have trouble coughing up the mucus. Your cough keeps you awake at night. You have a fever. Get help right away if: You cough up blood. You have chest pain. You have very bad shortness of breath. You faint or keep feeling like you are going to faint. You have a very bad headache. Your fever or chills get worse. These symptoms may be an emergency. Get help right away. Call your local emergency services (911 in the [place] Do not wait to see if the symptoms will go away. Do not drive yourself to the hospital. Summary Acute bronchitis is when air tubes in the lungs (bronchi) suddenly get swollen. In adults, acute bronchitis usually goes away within 2 weeks. Drink more fluids. This will help thin your mucus so it is easier  to cough up. Take over-the-counter and prescription medicines only as told by your doctor. Contact a doctor if your symptoms do not improve after 2 weeks of treatment. This information is not intended to replace advice given to you by your health care provider. Make sure you discuss any questions you have with your health care provider. Document Revised: 07/26/2020 Document Reviewed: 07/26/2020 Elsevier Patient  Education  2024 [employer].

## 2023-05-27 NOTE — Assessment & Plan Note (Signed)
Clinically stable. Continue daily Trelegy and albuterol inhaler as needed

## 2023-05-27 NOTE — Progress Notes (Signed)
Jose Snow 53 y.o.   Chief Complaint  Patient presents with   Cough    Patient states its been going on for 5 days and states has has chest pain with the cough. Fever, itchy body, headaches, yellowish mucus. Patient went to urgent care yesterday and they gave him cough medicine. He was not tested for COVID/ FLU    HISTORY OF PRESENT ILLNESS: This is a 53 y.o. male complaining of productive cough along with fever, headaches, body aches that started 5 days ago Went to urgent care center yesterday.  Had chest x-ray done.  Started on cough medicine. No other associated symptoms No other complaints or medical concerns today.  Cough Pertinent negatives include no chest pain, chills, fever, headaches, hemoptysis, rash, sore throat or shortness of breath.     Prior to Admission medications   Medication Sig Start Date End Date Taking? Authorizing Provider  azithromycin (ZITHROMAX) 250 MG tablet Sig as indicated 05/27/23  Yes Dametra Whetsel, Eilleen Kempf, MD  B-D UF III MINI PEN NEEDLES 31G X 5 MM MISC daily. use as directed 06/21/19  Yes [provider]  benzonatate (TESSALON) 100 MG capsule Take 1 capsule (100 mg total) by mouth 3 (three) times daily as needed for cough. 05/26/23  Yes Rising, Lurena Joiner, PA-C  emtricitabine-tenofovir AF (DESCOVY) 200-25 MG tablet Take 1 tablet by mouth daily. 10/03/22  Yes Veryl Speak, FNP  insulin glargine (LANTUS) 100 UNIT/ML injection Inject 6 Units into the skin daily.    Yes [provider]  Lancets (ONETOUCH DELICA PLUS LANCET33G) MISC Apply topically. 09/06/20  Yes [provider]  metFORMIN (GLUCOPHAGE) 500 MG tablet Take 1 tablet (500 mg total) by mouth daily. 08/08/22 08/03/23 Yes Antavius Sperbeck, Eilleen Kempf, MD  nadolol (CORGARD) 20 MG tablet TAKE 1 TABLET(20 MG) BY MOUTH DAILY 08/13/22  Yes Iva Boop, MD  omeprazole (PRILOSEC) 40 MG capsule TAKE 1 CAPSULE BY MOUTH DAILY BEFORE BREAKFAST 08/13/22  Yes Iva Boop, MD   promethazine-dextromethorphan (PROMETHAZINE-DM) 6.25-15 MG/5ML syrup Take 5 mLs by mouth 4 (four) times daily as needed for cough. 05/26/23  Yes Rising, Lurena Joiner, PA-C  albuterol (VENTOLIN HFA) 108 (90 Base) MCG/ACT inhaler Inhale 1-2 puffs into the lungs every 4 (four) hours as needed for wheezing or shortness of breath. 05/27/23 06/26/23  Georgina Quint, MD  Fluticasone-Umeclidin-Vilant (TRELEGY ELLIPTA) 100-62.5-25 MCG/ACT AEPB Inhale 1 puff into the lungs daily. 05/27/23   Georgina Quint, MD    Allergies  Allergen Reactions   Other     Yellow cough medicine makes him dizzy    Patient Active Problem List   Diagnosis Date Noted   Encounter for well adult exam with abnormal findings 11/02/2022   Finger laceration 10/30/2022   Chronic venous insufficiency 08/08/2022   Hepatoma (HCC) 01/31/2022   Moderate COPD (chronic obstructive pulmonary disease) (HCC) 01/31/2022   Liver cirrhosis secondary to NASH (HCC)    Chronic cough 12/08/2019   Pancytopenia (HCC) 12/03/2017   Abnormal echocardiogram 12/03/2017   History of hepatitis B 12/03/2017   Type 2 diabetes mellitus with other specified complication, without long-term current use of insulin (HCC)    Esophageal varices without bleeding (HCC)    Heart murmur 08/01/2017   Hepatitis B 02/17/2015   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     Past Surgical History:  Procedure Laterality Date   BIOPSY  10/23/2017   Procedure: BIOPSY;  Surgeon: Rachael Fee, MD;  Location: [facility] END[facility];  Service: Endoscopy;;   BIOPSY  03/03/2018   Procedure: BIOPSY;  Surgeon: Lemar Lofty., MD;  Location: [facility] ENDOSCOPY;  Service: Gastroenterology;;   COLONOSCOPY WITH PROPOFOL N/A 10/23/2017   Procedure: COLONOSCOPY WITH PROPOFOL;  Surgeon: Rachael Fee, MD;  Location: [facility];  Service: Endoscopy;  Laterality: N/A;   COMPLEX WOUND CLOSURE Right 12/15/2015   Procedure: COMPLEX WOUND CLOSURE;  Surgeon: Betha Loa, MD;  Location: [facility] OR;  Service: Orthopedics;  Laterality: Right;   ESOPHAGOGASTRODUODENOSCOPY (EGD) WITH PROPOFOL N/A 10/23/2017   Procedure: ESOPHAGOGASTRODUODENOSCOPY (EGD) WITH PROPOFOL;  Surgeon: Rachael Fee, MD;  Location: [facility];  Service: Endoscopy;  Laterality: N/A;   ESOPHAGOGASTRODUODENOSCOPY (EGD) WITH PROPOFOL N/A 03/03/2018   Procedure: ESOPHAGOGASTRODUODENOSCOPY (EGD) WITH PROPOFOL;  Surgeon: Meridee Score Netty Starring., MD;  Location[facility] ENDOSCOPY;  Service: Gastroenterology;  Laterality: N/A;   I & D EXTREMITY Right 12/15/2015   Procedure: IRRIGATION AND DEBRIDEMENT AND REVISION AMPUTATION RIGHT RING FINGER;  Surgeon: Betha Loa, MD;  Location: [facility] OR;  Service: Orthopedics;  Laterality: Right;    Social History   Socioeconomic History   Marital status: Married    Spouse name: Not on file   Number of children: 4   Years of education: Not on file   Highest education level: Not on file  Occupational History   Occupation: [employer]    Comment: Builds chair parts  Tobacco Use   Smoking status: Former    Current packs/day: 0.00    Average packs/day: 0.5 packs/day for 8.0 years (4.0 ttl pk-yrs)    Types: Cigarettes    Start date: 04/08/1982    Quit date: 04/08/1990    Years since quitting: 33.1   Smokeless tobacco: Never  Vaping Use   Vaping status: Never Used  Substance and Sexual Activity   Alcohol use: No   Drug use: No   Sexual activity: Yes  Other Topics Concern   Not on file  Social History Narrative   Work: Statistician paper - factory work    Lives with wife and four children. Oldest born 4 youngest 2008   Social Drivers of  Corporate investment banker Strain: Not on [employer] Insecurity: Not on file  Transportation Needs: Not on file  Physical Activity: Not on file  Stress: Not on file  Social Connections: Not on file  Intimate Partner Violence: Not on file    Family History  Problem Relation Age of Onset   Colon cancer Neg Hx    Esophageal cancer Neg Hx    Pancreatic cancer Neg Hx    Stomach cancer Neg Hx    Liver disease Neg Hx      Review of Systems  Constitutional: Negative.  Negative for chills and fever.  HENT:  Positive for congestion. Negative for sore throat.   Respiratory:  Positive for cough and sputum production. Negative for hemoptysis and shortness of breath.   Cardiovascular: Negative.  Negative for chest pain and palpitations.  Gastrointestinal:  Negative for abdominal pain, nausea and vomiting.  Genitourinary: Negative.  Negative for dysuria and hematuria.  Skin: Negative.  Negative for  rash.  Neurological: Negative.  Negative for dizziness and headaches.  All other systems reviewed and are negative.   Vitals:   05/27/23 1516  BP: 126/64  Pulse: 98  Temp: 99.6 F (37.6 C)  SpO2: 99%    Physical Exam Constitutional:      Appearance: Normal appearance.  HENT:     Head: Normocephalic.     Mouth/Throat:     Mouth: Mucous membranes are moist.     Pharynx: Oropharynx is clear.  Eyes:     Extraocular Movements: Extraocular movements intact.     Conjunctiva/sclera: Conjunctivae normal.     Pupils: Pupils are equal, round, and reactive to light.  Cardiovascular:     Rate and Rhythm: Normal rate and regular rhythm.     Pulses: Normal pulses.     Heart sounds: Normal heart sounds.  Pulmonary:     Breath sounds: Rales (Left lower third) present.  Skin:    General: Skin is warm and dry.  Neurological:     Mental Status: He is alert and oriented to person, place, and time.  Psychiatric:        Behavior: Behavior normal.      ASSESSMENT & PLAN: A total of 34  minutes was spent with the patient and counseling/coordination of care regarding preparing for this visit, review of most recent office visit notes, review of all medications, review of chest x-ray images done yesterday, presence of possible pneumonia and need for antibiotics, symptom management, prognosis, documentation and need for follow-up.  Problem List Items Addressed This Visit       Respiratory   Moderate COPD (chronic obstructive pulmonary disease) (HCC)   Clinically stable. Continue daily Trelegy and albuterol inhaler as needed      Relevant Medications   Fluticasone-Umeclidin-Vilant (TRELEGY ELLIPTA) 100-62.5-25 MCG/ACT AEPB   albuterol (VENTOLIN HFA) 108 (90 Base) MCG/ACT inhaler   azithromycin (ZITHROMAX) 250 MG tablet   Lower respiratory infection - Primary   Clinically stable.  No red flag signs or symptoms Findings on physical examination suggestive of pneumonia left lower lobe.  Low-grade fever. Chest x-ray done yesterday reviewed.  Abnormal findings on same side Recommend to start daily azithromycin for 5 days Symptom management discussed Advised to contact the office if no better or worse during the next several days.       Relevant Medications   azithromycin (ZITHROMAX) 250 MG tablet   Patient Instructions  Acute Bronchitis, Adult  Acute bronchitis is when air tubes in the lungs (bronchi) suddenly get swollen. The condition can make it hard for you to breathe. In adults, acute bronchitis usually goes away within 2 weeks. A cough caused by bronchitis may last up to 3 weeks. Smoking, allergies, and asthma can make the condition worse. What are the causes? Germs that cause cold and flu (viruses). The most common cause of this condition is the virus that causes the common cold. Bacteria. Substances that bother (irritate) the lungs, including: Smoke from cigarettes and other types of tobacco. Dust and pollen. Fumes from chemicals, gases, or burned fuel. Indoor or  outdoor air pollution. What increases the risk? A weak body's defense system. This is also called the immune system. Any condition that affects your lungs and breathing, such as asthma. What are the signs or symptoms? A cough. Coughing up clear, yellow, or green mucus. Making high-pitched whistling sounds when you breathe, most often when you breathe out (wheezing). Runny or stuffy nose. Having too much mucus in your lungs (chest congestion).  Shortness of breath. Body aches. A sore throat. How is this treated? Acute bronchitis may go away over time without treatment. Your doctor may tell you to: Drink more fluids. This will help thin your mucus so it is easier to cough up. Use a device that gets medicine into your lungs (inhaler). Use a vaporizer or a humidifier. These are machines that add water to the air. This helps with coughing and poor breathing. Take a medicine that thins mucus and helps clear it from your lungs. Take a medicine that prevents or stops coughing. It is not common to take an antibiotic medicine for this condition. Follow these instructions at home:  Take over-the-counter and prescription medicines only as told by your doctor. Use an inhaler, vaporizer, or humidifier as told by your doctor. Take two teaspoons (10 mL) of honey at bedtime. This helps lessen your coughing at night. Drink enough fluid to keep your pee (urine) pale yellow. Do not smoke or use any products that contain nicotine or tobacco. If you need help quitting, ask your doctor. Get a lot of rest. Return to your normal activities when your doctor says that it is safe. Keep all follow-up visits. How is this prevented?  Wash your hands often with soap and water for at least 20 seconds. If you cannot use soap and water, use hand sanitizer. Avoid contact with people who have cold symptoms. Try not to touch your mouth, nose, or eyes with your hands. Avoid breathing in smoke or chemical fumes. Make  sure to get the flu shot every year. Contact a doctor if: Your symptoms do not get better in 2 weeks. You have trouble coughing up the mucus. Your cough keeps you awake at night. You have a fever. Get help right away if: You cough up blood. You have chest pain. You have very bad shortness of breath. You faint or keep feeling like you are going to faint. You have a very bad headache. Your fever or chills get worse. These symptoms may be an emergency. Get help right away. Call your local emergency services (911 in the [place] Do not wait to see if the symptoms will go away. Do not drive yourself to the hospital. Summary Acute bronchitis is when air tubes in the lungs (bronchi) suddenly get swollen. In adults, acute bronchitis usually goes away within 2 weeks. Drink more fluids. This will help thin your mucus so it is easier to cough up. Take over-the-counter and prescription medicines only as told by your doctor. Contact a doctor if your symptoms do not improve after 2 weeks of treatment. This information is not intended to replace advice given to you by your health care provider. Make sure you discuss any questions you have with your health care provider. Document Revised: 07/26/2020 Document Reviewed: 07/26/2020 Elsevier Patient Education  2024 Elsevier Inc.       Edwina Barth, MD [facility]

## 2023-05-27 NOTE — Assessment & Plan Note (Signed)
Clinically stable.  No red flag signs or symptoms Findings on physical examination suggestive of pneumonia left lower lobe.  Low-grade fever. Chest x-ray done yesterday reviewed.  Abnormal findings on same side Recommend to start daily azithromycin for 5 days Symptom management discussed Advised to contact the office if no better or worse during the next several days.

## 2023-05-29 ENCOUNTER — Other Ambulatory Visit: Payer: Self-pay

## 2023-06-12 ENCOUNTER — Other Ambulatory Visit: Payer: Self-pay

## 2023-06-23 ENCOUNTER — Other Ambulatory Visit: Payer: Self-pay

## 2023-07-22 ENCOUNTER — Other Ambulatory Visit (HOSPITAL_COMMUNITY): Payer: Self-pay

## 2023-07-23 ENCOUNTER — Other Ambulatory Visit (HOSPITAL_COMMUNITY): Payer: Self-pay

## 2023-07-23 ENCOUNTER — Other Ambulatory Visit: Payer: Self-pay

## 2023-07-23 NOTE — Progress Notes (Signed)
 Specialty Pharmacy Refill Coordination Note  Jose Snow is a 53 y.o. male contacted today regarding refills of specialty medication(s) Emtricitabine-Tenofovir AF (Descovy)   Patient requested Delivery   Delivery date: 07/25/23   Verified address: [address] [name] [address]   Medication will be filled on 07/24/23.

## 2023-07-24 ENCOUNTER — Other Ambulatory Visit: Payer: Self-pay

## 2023-08-12 ENCOUNTER — Other Ambulatory Visit: Payer: Self-pay

## 2023-08-15 ENCOUNTER — Other Ambulatory Visit: Payer: Self-pay

## 2023-08-15 NOTE — Progress Notes (Signed)
 Specialty Pharmacy Refill Coordination Note  Jose Snow is a 53 y.o. male contacted today regarding refills of specialty medication(s) Emtricitabine -Tenofovir  AF (Descovy )   Patient requested Delivery   Delivery date: 08/20/23   Verified address: [address] [place] [address]   Medication will be filled on 08/19/23.

## 2023-08-19 ENCOUNTER — Other Ambulatory Visit (HOSPITAL_COMMUNITY): Payer: Self-pay

## 2023-08-19 ENCOUNTER — Other Ambulatory Visit: Payer: Self-pay

## 2023-08-21 ENCOUNTER — Other Ambulatory Visit (HOSPITAL_COMMUNITY): Payer: Self-pay

## 2023-09-15 ENCOUNTER — Other Ambulatory Visit: Payer: Self-pay

## 2023-09-17 ENCOUNTER — Other Ambulatory Visit: Payer: Self-pay

## 2023-09-22 ENCOUNTER — Other Ambulatory Visit: Payer: Self-pay

## 2023-09-22 ENCOUNTER — Telehealth: Payer: Self-pay

## 2023-09-22 ENCOUNTER — Other Ambulatory Visit (HOSPITAL_COMMUNITY): Payer: Self-pay

## 2023-09-22 DIAGNOSIS — B1910 Unspecified viral hepatitis B without hepatic coma: Secondary | ICD-10-CM

## 2023-09-22 NOTE — Telephone Encounter (Signed)
 US  was ordered and scheduled for the pt on 09/30/2023 at 9:00 AM  at [facility]. Pt to arrive at 8:30 PM. Nothing to eat or drink past midnight.  Left message for pt daughter Dagmar Drones to call back

## 2023-09-22 NOTE — Telephone Encounter (Signed)
-----   Message from Nurse Bea Lime sent at 03/24/2023 10:22 AM EST ----- Personal reminder sent  03/24/2023  Repeat US  in 6 months:   Dr. Willy Harvest.

## 2023-09-22 NOTE — Progress Notes (Signed)
 Specialty Pharmacy Refill Coordination Note  Spoke with H,Liem (Daughter).   Jose Snow is a 53 y.o. male contacted today regarding refills of specialty medication(s) Emtricitabine -Tenofovir  AF (Descovy )  Patient requested Delivery   Delivery date: 09/24/23   Verified address: [address]  [place] [address]  Medication will be filled on 09/23/23.

## 2023-09-23 ENCOUNTER — Other Ambulatory Visit: Payer: Self-pay | Admitting: Emergency Medicine

## 2023-09-23 NOTE — Telephone Encounter (Signed)
 Left message for pt daughter Dagmar Drones to call back

## 2023-09-25 ENCOUNTER — Ambulatory Visit (HOSPITAL_COMMUNITY)
Admission: EM | Admit: 2023-09-25 | Discharge: 2023-09-25 | Disposition: A | Discharge status: Home / Self Care | Payer: Self-pay | Attending: Emergency Medicine | Admitting: Emergency Medicine

## 2023-09-25 ENCOUNTER — Encounter (HOSPITAL_COMMUNITY): Payer: Self-pay | Admitting: Emergency Medicine

## 2023-09-25 DIAGNOSIS — T1592XA Foreign body on external eye, part unspecified, left eye, initial encounter: Secondary | ICD-10-CM

## 2023-09-25 MED ORDER — EYE WASH OP SOLN
OPHTHALMIC | Status: AC
  Filled 2023-09-25: qty 118

## 2023-09-25 MED ORDER — FLUORESCEIN SODIUM 1 MG OP STRP
ORAL_STRIP | OPHTHALMIC | Status: AC
  Filled 2023-09-25: qty 1

## 2023-09-25 MED ORDER — TETRACAINE HCL 0.5 % OP SOLN
OPHTHALMIC | Status: AC
  Filled 2023-09-25: qty 4

## 2023-09-25 MED ORDER — MOXIFLOXACIN HCL 0.5 % OP SOLN: 1.0000 [drp] | Freq: Three times a day (TID) | OPHTHALMIC | 0 refills | Status: AC

## 2023-09-25 NOTE — ED Provider Notes (Signed)
 [facility]    CSN: [id number] Arrival date & time: 09/25/23  0912     History   Chief Complaint Chief Complaint  Patient presents with   Foreign Body in Eye    HPI Jose Snow is a 53 y.o. male.  Here with daughter who helps provide some translation Left eye pain for 1 week.  Sensation of foreign body.  He denies any discharge.  No vision changes.  No pain with EOM,  He does work for a Journalist, newspaper.  Reports he wears safety glasses at work Does have an eye doctor. Did not call them yet  Past Medical History:  Diagnosis Date   Cirrhosis (HCC)    Diabetes mellitus (HCC)    Hepatitis B    Liver cirrhosis secondary to NASH (HCC)    Lymphocytic colitis    Pancytopenia (HCC)    TB (tuberculosis)    treated 15 months at health dept[facility]     Patient Active Problem List   Diagnosis Date Noted   Lower respiratory infection 05/27/2023   Encounter for well adult exam with abnormal findings 11/02/2022   Finger laceration 10/30/2022   Chronic venous insufficiency 08/08/2022   Hepatoma (HCC) 01/31/2022   Moderate COPD (chronic obstructive pulmonary disease) (HCC) 01/31/2022   Liver cirrhosis secondary to NASH (HCC)    Chronic cough 12/08/2019   Pancytopenia (HCC) 12/03/2017   Abnormal echocardiogram 12/03/2017   History of hepatitis B 12/03/2017   Type 2 diabetes mellitus with other specified complication, without long-term current use of insulin  (HCC)    Esophageal varices without bleeding (HCC)    Heart murmur 08/01/2017   Hepatitis B 02/17/2015   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) 11/25/2014   Thrombocytopenia (HCC) 11/25/2014   History of TB (tuberculosis) 07/16/2010    Past Surgical History:  Procedure Laterality Date   BIOPSY  10/23/2017   Procedure: BIOPSY;  Surgeon: Janel Medford, MD;  Location: [facility];  Service: Endoscopy;;   BIOPSY  03/03/2018   Procedure: BIOPSY;  Surgeon: Normie Becton., MD;  Location: [facility] ENDOSCOPY;   Service: Gastroenterology;;   COLONOSCOPY WITH PROPOFOL  N/A 10/23/2017   Procedure: COLONOSCOPY WITH PROPOFOL ;  Surgeon: Janel Medford, MD;  Location: [facility];  Service: Endoscopy;  Laterality: N/A;   COMPLEX WOUND CLOSURE Right 12/15/2015   Procedure: COMPLEX WOUND CLOSURE;  Surgeon: Brunilda Capra, MD;  Location: [facility] OR;  Service: Orthopedics;  Laterality: Right;   ESOPHAGOGASTRODUODENOSCOPY (EGD) WITH PROPOFOL  N/A 10/23/2017   Procedure: ESOPHAGOGASTRODUODENOSCOPY (EGD) WITH PROPOFOL ;  Surgeon: Janel Medford, MD;  Location: [facility];  Service: Endoscopy;  Laterality: N/A;   ESOPHAGOGASTRODUODENOSCOPY (EGD) WITH PROPOFOL  N/A 03/03/2018   Procedure: ESOPHAGOGASTRODUODENOSCOPY (EGD) WITH PROPOFOL ;  Surgeon: Brice Campi Albino Alu., MD;  Location: [facility] ENDOSCOPY;  Service: Gastroenterology;  Laterality: N/A;   I & D EXTREMITY Right 12/15/2015   Procedure: IRRIGATION AND DEBRIDEMENT AND REVISION AMPUTATION RIGHT RING FINGER;  Surgeon: Brunilda Capra, MD;  Location: [facility] OR;  Service: Orthopedics;  Laterality: Right;       Home Medications    Prior to Admission medications   Medication Sig Start Date End Date Taking? Authorizing Provider  moxifloxacin (VIGAMOX) 0.5 % ophthalmic solution Place 1 drop into both eyes 3 (three) times daily. 09/25/23  Yes Vedanth Sirico, Ivette Marks, PA-C  albuterol  (VENTOLIN  HFA) 108 (90 Base) MCG/ACT inhaler Inhale 1-2 puffs into the lungs every 4 (four) hours as needed for wheezing or shortness of breath. 05/27/23 06/26/23  Elvira Hammersmith, MD  B-D UF III MINI PEN NEEDLES 31G X 5 MM MISC daily. use as directed 06/21/19   [provider]  emtricitabine -tenofovir  AF (DESCOVY ) 200-25 MG tablet Take 1 tablet by mouth daily. 10/03/22   Calone, Gregory D, FNP  Fluticasone -Umeclidin-Vilant (TRELEGY ELLIPTA ) 100-62.5-25 MCG/ACT AEPB Inhale 1 puff into the lungs daily. 05/27/23   Elvira Hammersmith, MD  insulin  glargine (LANTUS ) 100 UNIT/ML injection Inject 6 Units into the  skin daily.     [provider]  Lancets [facility] DELICA PLUS [place]) MISC Apply topically. 09/06/20   [provider]  metFORMIN  (GLUCOPHAGE ) 500 MG tablet TAKE 1 TABLET(500 MG) BY MOUTH DAILY 09/23/23   Elvira Hammersmith, MD  nadolol  (CORGARD ) 20 MG tablet TAKE 1 TABLET(20 MG) BY MOUTH DAILY 08/13/22   Kenney Peacemaker, MD  omeprazole  (PRILOSEC) 40 MG capsule TAKE 1 CAPSULE BY MOUTH DAILY BEFORE BREAKFAST 08/13/22   Kenney Peacemaker, MD  promethazine -dextromethorphan  (PROMETHAZINE -DM) 6.25-15 MG/5ML syrup Take 5 mLs by mouth 4 (four) times daily as needed for cough. 05/26/23   Ever Halberg, Ivette Marks, PA-C    Family History Family History  Problem Relation Age of Onset   Colon cancer Neg Hx    Esophageal cancer Neg Hx    Pancreatic cancer Neg Hx    Stomach cancer Neg Hx    Liver disease Neg Hx     Social History Social History   Tobacco Use   Smoking status: Former    Current packs/day: 0.00    Average packs/day: 0.5 packs/day for 8.0 years (4.0 ttl pk-yrs)    Types: Cigarettes    Start date: 04/08/1982    Quit date: 04/08/1990    Years since quitting: 33.4   Smokeless tobacco: Never  Vaping Use   Vaping status: Never Used  Substance Use Topics   Alcohol use: No   Drug use: No     Allergies   Other   Review of Systems Review of Systems  As per HPI  Physical Exam Triage Vital Signs ED Triage Vitals  Encounter Vitals Group     BP 09/25/23 0951 (!) 118/55     Girls Systolic BP Percentile --      Girls Diastolic BP Percentile --      Boys Systolic BP Percentile --      Boys Diastolic BP Percentile --      Pulse Rate 09/25/23 0951 63     Resp 09/25/23 0951 13     Temp 09/25/23 0951 97.8 F (36.6 C)     Temp Source 09/25/23 0951 Oral     SpO2 09/25/23 0951 96 %     Weight --      Height --      Head Circumference --      Peak Flow --      Pain Score 09/25/23 0950 4     Pain Loc --      Pain Education --      Exclude from Growth Chart --    No  data found.  Updated Vital Signs BP (!) 118/55 (BP Location: Left Arm)   Pulse 63   Temp 97.8 F (36.6 C) (Oral)   Resp 13   SpO2 96%    Physical Exam Vitals and nursing note reviewed.  Constitutional:      Appearance: Normal appearance.  HENT:     Mouth/Throat:     Mouth: Mucous membranes are moist.     Pharynx: Oropharynx is clear.   Eyes:  General: Lids are normal. Vision grossly intact. Gaze aligned appropriately.        Left eye: Foreign body present.    Extraocular Movements: Extraocular movements intact.     Pupils: Pupils are equal, round, and reactive to light.      Comments: White foreign body noted 11 o clock position of left conjunctiva. There is also a flat black dot on the lower iris, unknown if this is chronic for patient.    Cardiovascular:     Rate and Rhythm: Normal rate and regular rhythm.     Heart sounds: Normal heart sounds.  Pulmonary:     Effort: Pulmonary effort is normal.     Breath sounds: Normal breath sounds.   Musculoskeletal:     Cervical back: Normal range of motion.   Skin:    General: Skin is warm and dry.   Neurological:     Mental Status: He is alert and oriented to person, place, and time.     UC Treatments / Results  Labs (all labs ordered are listed, but only abnormal results are displayed) Labs Reviewed - No data to display  EKG   Radiology No results found.  Procedures Procedures (including critical care time)  Medications Ordered in UC Medications - No data to display  Initial Impression / Assessment and Plan / UC Course  I have reviewed the triage vital signs and the nursing notes.  Pertinent labs & imaging results that were available during my care of the patient were reviewed by me and considered in my medical decision making (see chart for details).  Unfortunately the wood's lamp in this clinic is broken and this exam was not able to be completed. However there is visible foreign body in the left eye  not able to be removed with soft swab. I have placed patient on vigamox TID and have advised call his eye clinic for follow up.  Final Clinical Impressions(s) / UC Diagnoses   Final diagnoses:  Foreign body of left eye, initial encounter     Discharge Instructions      Vigamox -- 1 drop in the left eye, 3 times daily  Call your eye doctor TODAY to tell them there is something in your eye and you need an appointment       ED Prescriptions     Medication Sig Dispense Auth. Provider   moxifloxacin (VIGAMOX) 0.5 % ophthalmic solution Place 1 drop into both eyes 3 (three) times daily. 3 mL Harel Repetto, Ivette Marks, PA-C      PDMP not reviewed this encounter.   Creighton Doffing, [place] 09/25/23 1123

## 2023-09-25 NOTE — Discharge Instructions (Addendum)
 Vigamox -- 1 drop in the left eye, 3 times daily  Call your eye doctor TODAY to tell them there is something in your eye and you need an appointment

## 2023-09-25 NOTE — ED Triage Notes (Addendum)
 Daughter translating for patient. Pt having pain in left eye for a week. Tried to call eye doctors but dont take walk in patient said. Denies loss of vision. States feels like something in left eye. Adds lots of watering to eye, denies discharge

## 2023-09-26 NOTE — Telephone Encounter (Signed)
 Spoke with daughter & US  will need to be rescheduled to wed/thurs. US  rescheduled for 6/25 at 7:30 am, arrive at 7:00 am NPO at midnight. Left detailed message on daughter's cell & provided radiology scheduling number if they need to reschedule as well as our office number if she has any further questions.

## 2023-09-30 ENCOUNTER — Ambulatory Visit (HOSPITAL_COMMUNITY): Payer: Self-pay

## 2023-10-01 ENCOUNTER — Ambulatory Visit (HOSPITAL_COMMUNITY)
Admission: RE | Admit: 2023-10-01 | Discharge: 2023-10-01 | Disposition: A | Discharge status: Home / Self Care | Payer: Self-pay | Source: Ambulatory Visit | Attending: Internal Medicine | Admitting: Internal Medicine

## 2023-10-01 DIAGNOSIS — K7460 Unspecified cirrhosis of liver: Secondary | ICD-10-CM | POA: Insufficient documentation

## 2023-10-01 DIAGNOSIS — B1910 Unspecified viral hepatitis B without hepatic coma: Secondary | ICD-10-CM | POA: Insufficient documentation

## 2023-10-03 ENCOUNTER — Ambulatory Visit: Payer: Self-pay | Admitting: Internal Medicine

## 2023-10-13 ENCOUNTER — Other Ambulatory Visit: Payer: Self-pay

## 2023-10-16 ENCOUNTER — Other Ambulatory Visit: Payer: Self-pay | Admitting: Family

## 2023-10-16 ENCOUNTER — Other Ambulatory Visit (HOSPITAL_COMMUNITY): Payer: Self-pay

## 2023-10-16 ENCOUNTER — Other Ambulatory Visit: Payer: Self-pay

## 2023-10-16 DIAGNOSIS — B181 Chronic viral hepatitis B without delta-agent: Secondary | ICD-10-CM

## 2023-10-16 MED ORDER — DESCOVY 200-25 MG PO TABS
1.0000 | ORAL_TABLET | Freq: Every day | ORAL | 0 refills | Status: DC
  Filled 2023-10-16 – 2023-10-22 (×2): qty 30, 30d supply, fill #0

## 2023-10-20 ENCOUNTER — Other Ambulatory Visit: Payer: Self-pay

## 2023-10-22 ENCOUNTER — Other Ambulatory Visit (HOSPITAL_COMMUNITY): Payer: Self-pay

## 2023-10-22 ENCOUNTER — Other Ambulatory Visit: Payer: Self-pay

## 2023-10-22 NOTE — Progress Notes (Signed)
 Specialty Pharmacy Refill Coordination Note  Spoke with H,Liem (Daughter)   Jose Snow is a 53 y.o. male contacted today regarding refills of specialty medication(s) Emtricitabine -Tenofovir  AF (Descovy )  Doses on hand: 9  Patient requested: Delivery   Delivery date: 10/24/23   Verified address: [address]  [place] [address]  Medication will be filled on 10/23/23.

## 2023-11-10 ENCOUNTER — Ambulatory Visit: Payer: Self-pay | Admitting: Family

## 2023-11-13 ENCOUNTER — Other Ambulatory Visit (HOSPITAL_BASED_OUTPATIENT_CLINIC_OR_DEPARTMENT_OTHER): Payer: Self-pay

## 2023-11-14 ENCOUNTER — Other Ambulatory Visit: Payer: Self-pay | Admitting: Family

## 2023-11-14 ENCOUNTER — Other Ambulatory Visit: Payer: Self-pay

## 2023-11-14 ENCOUNTER — Other Ambulatory Visit (HOSPITAL_COMMUNITY): Payer: Self-pay

## 2023-11-14 DIAGNOSIS — B181 Chronic viral hepatitis B without delta-agent: Secondary | ICD-10-CM

## 2023-11-14 MED ORDER — DESCOVY 200-25 MG PO TABS
1.0000 | ORAL_TABLET | Freq: Every day | ORAL | 0 refills | Status: DC
  Filled 2023-11-14 – 2023-11-17 (×2): qty 30, 30d supply, fill #0

## 2023-11-17 ENCOUNTER — Other Ambulatory Visit: Payer: Self-pay

## 2023-11-17 NOTE — Progress Notes (Signed)
 Specialty Pharmacy Refill Coordination Note  Jose Snow is a 53 y.o. male contacted today regarding refills of specialty medication(s) Emtricitabine -Tenofovir  AF (Descovy )   Patient requested Delivery   Delivery date: 11/18/23   Verified address: [address]  [place] [address]   Medication will be filled on 11/17/23.

## 2023-12-10 ENCOUNTER — Other Ambulatory Visit: Payer: Self-pay

## 2023-12-10 ENCOUNTER — Other Ambulatory Visit: Payer: Self-pay | Admitting: Family

## 2023-12-10 DIAGNOSIS — B181 Chronic viral hepatitis B without delta-agent: Secondary | ICD-10-CM

## 2023-12-11 ENCOUNTER — Other Ambulatory Visit: Payer: Self-pay

## 2023-12-11 MED ORDER — DESCOVY 200-25 MG PO TABS
1.0000 | ORAL_TABLET | Freq: Every day | ORAL | 5 refills | Status: DC
Start: 2023-12-11 — End: 2024-01-14
  Filled 2023-12-11 – 2023-12-15 (×3): qty 30, 30d supply, fill #0
  Filled 2024-01-14: qty 30, 30d supply, fill #1

## 2023-12-12 ENCOUNTER — Other Ambulatory Visit: Payer: Self-pay

## 2023-12-12 ENCOUNTER — Other Ambulatory Visit (HOSPITAL_COMMUNITY): Payer: Self-pay

## 2023-12-12 ENCOUNTER — Telehealth: Payer: Self-pay

## 2023-12-12 NOTE — Telephone Encounter (Signed)
 [facility] Patient Advocate Encounter   Patient has been approved for [employer]ancing [employer] Patient Assistance Program for Descovy  from 12/12/23 to 12/10/24.  This assistance will make the patient's copay 30.  The billing information is as follows and has been shared with [facility].  1st fill will be filled at [facility] and the rest of the fills will be shipped to the office.        Patient knows to call the office with questions or concerns.  Jose Snow, Jose Snow Specialty Pharmacy Patient [facility] Phone: [phone] Fax: [phone] 12/12/2023 11:36 AM

## 2023-12-15 ENCOUNTER — Other Ambulatory Visit: Payer: Self-pay

## 2023-12-17 ENCOUNTER — Other Ambulatory Visit: Payer: Self-pay

## 2023-12-17 NOTE — Progress Notes (Signed)
 Specialty Pharmacy Refill Coordination Note  Jose Snow is a 53 y.o. male contacted today regarding refills of specialty medication(s) Emtricitabine -Tenofovir  AF (Descovy )  Spoke with Daughter  Patient requested Delivery   Delivery date: 12/19/23   Verified address: [address]  [place]    Medication will be filled on 12/18/23.

## 2024-01-12 ENCOUNTER — Other Ambulatory Visit: Payer: Self-pay

## 2024-01-14 ENCOUNTER — Other Ambulatory Visit: Payer: Self-pay

## 2024-01-14 ENCOUNTER — Other Ambulatory Visit (HOSPITAL_COMMUNITY): Payer: Self-pay

## 2024-01-14 ENCOUNTER — Other Ambulatory Visit: Payer: Self-pay | Admitting: Family

## 2024-01-14 MED ORDER — EMTRICITABINE-TENOFOVIR AF 200-25 MG PO TABS: 1.0000 | ORAL_TABLET | Freq: Every day | ORAL | 6 refills | Status: DC

## 2024-01-14 NOTE — Progress Notes (Signed)
 The patient will now be filling at [facility] due to being enrolled in the [facility] Patient Assistance Program.

## 2024-01-15 ENCOUNTER — Other Ambulatory Visit: Payer: Self-pay

## 2024-01-15 ENCOUNTER — Encounter: Payer: Self-pay | Admitting: Internal Medicine

## 2024-01-15 ENCOUNTER — Ambulatory Visit (INDEPENDENT_AMBULATORY_CARE_PROVIDER_SITE_OTHER): Payer: Self-pay | Admitting: Internal Medicine

## 2024-01-15 VITALS — BP 122/69 | HR 80 | Temp 98.0°F | Ht 60.0 in | Wt 122.0 lb

## 2024-01-15 DIAGNOSIS — B181 Chronic viral hepatitis B without delta-agent: Secondary | ICD-10-CM

## 2024-01-15 DIAGNOSIS — Z2821 Immunization not carried out because of patient refusal: Secondary | ICD-10-CM

## 2024-01-15 DIAGNOSIS — Z23 Encounter for immunization: Secondary | ICD-10-CM

## 2024-01-15 DIAGNOSIS — Z79899 Other long term (current) drug therapy: Secondary | ICD-10-CM

## 2024-01-15 DIAGNOSIS — K7460 Unspecified cirrhosis of liver: Secondary | ICD-10-CM

## 2024-01-15 NOTE — Progress Notes (Signed)
 Patient ID: Jose Snow, male   DOB: 1971/01/01, 53 y.o.   MRN: [id number] RFV: chronic hepatitis b HPI  Jose Snow is a 53 y.o. 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 Continues to take descovy , not missing doses. He reports that his blood surgar is under 200 when he checks, in the morning he notices that it is 115-120.  He denies abdominal pain nor any jaundice  He works as a Estate agent in evening.    Jose Snow primary preferred language is Montagnard and a medical interpreter is present to aid in communication.    Outpatient Encounter Medications as of 01/15/2024  Medication Sig   albuterol  (VENTOLIN  HFA) 108 (90 Base) MCG/ACT inhaler Inhale 1-2 puffs into the lungs every 4 (four) hours as needed for wheezing or shortness of breath.   B-D UF III MINI PEN NEEDLES 31G X 5 MM MISC daily. use as directed   emtricitabine -tenofovir  AF (DESCOVY ) 200-25 MG tablet Take 1 tablet by mouth daily.   Fluticasone -Umeclidin-Vilant (TRELEGY ELLIPTA ) 100-62.5-25 MCG/ACT AEPB Inhale 1 puff into the lungs daily.   insulin  glargine (LANTUS ) 100 UNIT/ML injection Inject 6 Units into the skin daily.    Lancets (ONETOUCH DELICA PLUS LANCET33G) MISC Apply topically.   metFORMIN  (GLUCOPHAGE ) 500 MG tablet TAKE 1 TABLET(500 MG) BY MOUTH DAILY   moxifloxacin  (VIGAMOX ) 0.5 % ophthalmic solution Place 1 drop into both eyes 3 (three) times daily.   nadolol  (CORGARD ) 20 MG tablet TAKE 1 TABLET(20 MG) BY MOUTH DAILY   omeprazole  (PRILOSEC) 40 MG capsule TAKE 1 CAPSULE BY MOUTH DAILY BEFORE BREAKFAST   promethazine -dextromethorphan  (PROMETHAZINE -DM) 6.25-15 MG/5ML syrup Take 5 mLs by mouth 4 (four) times daily as needed for cough.   [DISCONTINUED] emtricitabine -tenofovir  AF (DESCOVY ) 200-25 MG tablet Take 1 tablet by mouth daily.   No facility-administered encounter medications on file as of 01/15/2024.     Patient Active Problem List   Diagnosis Date Noted    Lower respiratory infection 05/27/2023   Encounter for well adult exam with abnormal findings 11/02/2022   Finger laceration 10/30/2022   Chronic venous insufficiency 08/08/2022   Hepatoma (HCC) 01/31/2022   Moderate COPD (chronic obstructive pulmonary disease) (HCC) 01/31/2022   Liver cirrhosis secondary to NASH (HCC)    Chronic cough 12/08/2019   Pancytopenia (HCC) 12/03/2017   Abnormal echocardiogram 12/03/2017   History of hepatitis B 12/03/2017   Type 2 diabetes mellitus with other specified complication, without long-term current use of insulin  (HCC)    Esophageal varices without bleeding (HCC)    Heart murmur 08/01/2017   Hepatitis B 02/17/2015   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) 11/25/2014   Thrombocytopenia 11/25/2014   History of TB (tuberculosis) 07/16/2010     Health Maintenance Due  Topic Date Due   Diabetic kidney evaluation - Urine ACR  Never done   Hepatitis B Vaccines 19-59 Average Risk (1 of 3 - 19+ 3-dose series) 04/09/1987   Pneumococcal Vaccine: 50+ Years (2 of 2 - PCV) 08/20/2019   OPHTHALMOLOGY EXAM  01/07/2020   Zoster Vaccines- Shingrix  (2 of 2) 03/28/2022   HEMOGLOBIN A1C  02/08/2023   FOOT EXAM  10/31/2023   Influenza Vaccine  11/07/2023   COVID-19 Vaccine (3 - 2025-26 season) 12/08/2023     Review of Systems 12 point ros is negative except for unintentional weight loss Physical Exam   BP 122/69 (BP Location: Right Arm, Patient Position: Sitting)   Pulse 80   Temp 98  F (36.7 C)   Ht 5' (1.524 m)   Wt 122 lb (55.3 kg)   BMI 23.83 kg/m  (#3 lb) Physical Exam  Constitutional: He is oriented to person, place, and time. He appears well-developed and well-nourished. No distress.  HENT:  Mouth/Throat: Oropharynx is clear and moist. No oropharyngeal exudate.  Cardiovascular: Normal rate, regular rhythm and normal heart sounds. Exam reveals no gallop and no friction rub.  No murmur heard.  Pulmonary/Chest: Effort normal and breath  sounds normal. No respiratory distress. He has no wheezes.  Abdominal: Soft. Bowel sounds are normal. He exhibits no distension. There is no tenderness.  Lymphadenopathy:  He has no cervical adenopathy.  Neurological: He is alert and oriented to person, place, and time.  Skin: Skin is warm and dry. No rash noted. No erythema.  Psychiatric: He has a normal mood and affect. His behavior is normal.   Lab Results  Component Value Date   HEPBSAB NON-REACTIVE 10/14/2022   No results found for: RPR, LABRPR  CBC Lab Results  Component Value Date   WBC 2.0 Repeated and verified X2. (L) 03/24/2023   RBC 3.77 (L) 03/24/2023   HGB 11.8 (L) 03/24/2023   HCT 34.8 (L) 03/24/2023   PLT 27.0 Repeated and verified X2. (LL) 03/24/2023   MCV 92.2 03/24/2023   MCH 30.5 10/14/2022   MCHC 33.9 03/24/2023   RDW 15.2 03/24/2023   LYMPHSABS 0.7 03/24/2023   MONOABS 0.1 03/24/2023   EOSABS 0.1 03/24/2023    BMET Lab Results  Component Value Date   NA 135 03/24/2023   K 4.3 03/24/2023   CL 103 03/24/2023   CO2 27 03/24/2023   GLUCOSE 401 (H) 03/24/2023   BUN 21 03/24/2023   CREATININE 1.16 03/24/2023   CALCIUM 8.5 03/24/2023   GFRNONAA >60 06/08/2019   GFRAA >60 06/08/2019      Assessment and Plan Chronic hepatitis b cirrhosis = will check viral load, afp,   Long term medication use= will check cmp  Liver lesion = will plan to get mri  Health maintenance = Only flu vaccine today

## 2024-01-16 ENCOUNTER — Ambulatory Visit (HOSPITAL_BASED_OUTPATIENT_CLINIC_OR_DEPARTMENT_OTHER)
Admission: RE | Admit: 2024-01-16 | Discharge: 2024-01-16 | Disposition: A | Discharge status: Home / Self Care | Payer: Self-pay | Source: Ambulatory Visit | Attending: Internal Medicine | Admitting: Internal Medicine

## 2024-01-16 DIAGNOSIS — B181 Chronic viral hepatitis B without delta-agent: Secondary | ICD-10-CM | POA: Insufficient documentation

## 2024-01-16 MED ORDER — GADOBUTROL 1 MMOL/ML IV SOLN
6.0000 mL | Freq: Once | INTRAVENOUS | Status: AC | PRN
  Administered 2024-01-16: 6 mL via INTRAVENOUS
  Filled 2024-01-16: qty 6

## 2024-01-20 LAB — CBC WITH DIFFERENTIAL/PLATELET
Absolute Lymphocytes: 471 {cells}/uL — ABNORMAL LOW (ref 850–3900)
Absolute Monocytes: 128 {cells}/uL — ABNORMAL LOW (ref 200–950)
Basophils Absolute: 10 {cells}/uL (ref 0–200)
Basophils Relative: 0.6 %
Eosinophils Absolute: 128 {cells}/uL (ref 15–500)
Eosinophils Relative: 7.5 %
HCT: 34.4 % — ABNORMAL LOW (ref 38.5–50.0)
Hemoglobin: 11.5 g/dL — ABNORMAL LOW (ref 13.2–17.1)
MCH: 31.1 pg (ref 27.0–33.0)
MCHC: 33.4 g/dL (ref 32.0–36.0)
MCV: 93 fL (ref 80.0–100.0)
Monocytes Relative: 7.5 %
Neutro Abs: 964 {cells}/uL — ABNORMAL LOW (ref 1500–7800)
Neutrophils Relative %: 56.7 %
Platelets: 22 Thousand/uL — ABNORMAL LOW (ref 140–400)
RBC: 3.7 Million/uL — ABNORMAL LOW (ref 4.20–5.80)
RDW: 14.6 % (ref 11.0–15.0)
Total Lymphocyte: 27.7 %
WBC: 1.7 Thousand/uL — ABNORMAL LOW (ref 3.8–10.8)

## 2024-01-20 LAB — AFP TUMOR MARKER: AFP-Tumor Marker: 3.6 ng/mL (ref <6.1)

## 2024-01-20 LAB — COMPREHENSIVE METABOLIC PANEL WITH GFR
AG Ratio: 1.3 (calc) (ref 1.0–2.5)
ALT: 19 U/L (ref 9–46)
AST: 22 U/L (ref 10–35)
Albumin: 3.8 g/dL (ref 3.6–5.1)
Alkaline phosphatase (APISO): 40 U/L (ref 35–144)
BUN: 13 mg/dL (ref 7–25)
CO2: 31 mmol/L (ref 20–32)
Calcium: 8.9 mg/dL (ref 8.6–10.3)
Chloride: 100 mmol/L (ref 98–110)
Creat: 1.15 mg/dL (ref 0.70–1.30)
Globulin: 2.9 g/dL (ref 1.9–3.7)
Glucose, Bld: 416 mg/dL — ABNORMAL HIGH (ref 65–99)
Potassium: 4.5 mmol/L (ref 3.5–5.3)
Sodium: 134 mmol/L — ABNORMAL LOW (ref 135–146)
Total Bilirubin: 1.8 mg/dL — ABNORMAL HIGH (ref 0.2–1.2)
Total Protein: 6.7 g/dL (ref 6.1–8.1)
eGFR: 75 mL/min/1.73m2 (ref >=60)

## 2024-01-20 LAB — HEPATITIS B DNA, ULTRAQUANTITATIVE, PCR
Hepatitis B DNA: NOT DETECTED [IU]/mL
Hepatitis B virus DNA: NOT DETECTED {Log_IU}/mL

## 2024-02-13 ENCOUNTER — Other Ambulatory Visit: Payer: Self-pay

## 2024-02-13 ENCOUNTER — Other Ambulatory Visit: Payer: Self-pay | Admitting: Emergency Medicine

## 2024-02-13 NOTE — Telephone Encounter (Signed)
 Copied from CRM [id number]. Topic: Clinical - Medication Refill >> Feb 13, 2024 10:53 AM Alfonso ORN wrote: Medication: emtricitabine -tenofovir  AF (DESCOVY ) 200-25 MG tablet  Has the patient contacted their pharmacy? Yes  This is the patient's preferred pharmacy:  [id number] - [place], [place] - [address] AT [facility] [address] [place] [address] Phone: [phone] Fax: [phone]   Is this the correct pharmacy for this prescription? Yes   Has the prescription been filled recently? No  Is the patient out of the medication? Yes  Has the patient been seen for an appointment in the last year OR does the patient have an upcoming appointment? Yes  Can we respond through MyChart? Yes

## 2024-02-17 MED ORDER — EMTRICITABINE-TENOFOVIR AF 200-25 MG PO TABS: 1.0000 | ORAL_TABLET | Freq: Every day | ORAL | 3 refills | Status: DC

## 2024-02-20 ENCOUNTER — Other Ambulatory Visit (HOSPITAL_COMMUNITY): Payer: Self-pay

## 2024-02-20 ENCOUNTER — Telehealth: Payer: Self-pay

## 2024-02-20 NOTE — Telephone Encounter (Signed)
 [facility] Patient Advocate Encounter  Completed and sent [employer] Advancing Access application for DESCOVY  for this patient who is uninsured.    Patient assistance phone number for follow up is [phone].   This encounter will be updated until final determination.   Charmaine Sharps, CPhT Specialty Pharmacy Patient [facility] for [facility] Disease Phone: [phone] Fax:  [phone]

## 2024-02-20 NOTE — Telephone Encounter (Signed)
 Patient was approved for the Patient Assistance Program through [employer] for Descovy  . Patient is approved from 02/20/24-02/18/25. Medication will be handled by [facility] and be shipped to the patient home.   [facility] pharmacy- [phone]

## 2024-03-22 ENCOUNTER — Telehealth: Payer: Self-pay

## 2024-03-22 NOTE — Telephone Encounter (Signed)
 [facility] Patient Advocate Encounter  Patient's medications DESCOVY  have been couriered to [facility] from [facility] [employer] and will be picked up at [facility].  Charmaine Sharps, CPhT Specialty Pharmacy Patient [facility] for Infectious Disease Phone: [phone] Fax:  [phone]

## 2024-03-24 ENCOUNTER — Other Ambulatory Visit (HOSPITAL_COMMUNITY): Payer: Self-pay

## 2024-03-25 ENCOUNTER — Telehealth: Payer: Self-pay

## 2024-03-25 NOTE — Telephone Encounter (Signed)
 Medication was picked up at [facility] on 03/25/24 @ 11:25am

## 2024-04-14 ENCOUNTER — Telehealth: Payer: Self-pay

## 2024-04-14 NOTE — Telephone Encounter (Signed)
 [facility] Patient Advocate Encounter  Patient's medications DESCOVY  have been couriered to [facility] from [facility] [employer] and will be picked up at [facility].  Charmaine Sharps, CPhT Specialty Pharmacy Patient [facility] for Infectious Disease Phone: [phone] Fax:  [phone]

## 2024-04-22 ENCOUNTER — Telehealth: Payer: Self-pay

## 2024-04-22 NOTE — Telephone Encounter (Signed)
 Patient left voicemail asking about his medication refill. Per Charmaine noted medication delivered to office, updated him that he can come by office and pickup medication.  Lorenda CHRISTELLA Code, RMA

## 2024-04-28 ENCOUNTER — Other Ambulatory Visit: Payer: Self-pay

## 2024-04-28 ENCOUNTER — Ambulatory Visit (INDEPENDENT_AMBULATORY_CARE_PROVIDER_SITE_OTHER): Payer: Self-pay | Admitting: Family

## 2024-04-28 ENCOUNTER — Telehealth: Payer: Self-pay

## 2024-04-28 VITALS — BP 142/71 | HR 72 | Temp 97.7°F | Wt 122.6 lb

## 2024-04-28 DIAGNOSIS — K7460 Unspecified cirrhosis of liver: Secondary | ICD-10-CM

## 2024-04-28 DIAGNOSIS — Z79899 Other long term (current) drug therapy: Secondary | ICD-10-CM | POA: Diagnosis not present

## 2024-04-28 DIAGNOSIS — Z113 Encounter for screening for infections with a predominantly sexual mode of transmission: Secondary | ICD-10-CM

## 2024-04-28 DIAGNOSIS — B181 Chronic viral hepatitis B without delta-agent: Secondary | ICD-10-CM

## 2024-04-28 DIAGNOSIS — B1910 Unspecified viral hepatitis B without hepatic coma: Secondary | ICD-10-CM

## 2024-04-28 MED ORDER — EMTRICITABINE-TENOFOVIR AF 200-25 MG PO TABS: 1.0000 | ORAL_TABLET | Freq: Every day | ORAL | 11 refills | Status: AC

## 2024-04-28 NOTE — Progress Notes (Unsigned)
 "  Subjective:   Patient ID: Jose Snow, male    DOB: 1970/10/18, 54 y.o.   MRN: [id number]  No chief complaint on file.   HPI:  Ralf Konopka is a 54 y.o. 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 Luiz on 01/15/24 with good adherence and tolerance to Descovy .    Allergies[1]    Outpatient Medications Prior to Visit  Medication Sig Dispense Refill   albuterol  (VENTOLIN  HFA) 108 (90 Base) MCG/ACT inhaler Inhale 1-2 puffs into the lungs every 4 (four) hours as needed for wheezing or shortness of breath. 1 each 6   B-D UF III MINI PEN NEEDLES 31G X 5 MM MISC daily. use as directed     emtricitabine -tenofovir  AF (DESCOVY ) 200-25 MG tablet Take 1 tablet by mouth daily. 30 tablet 3   Fluticasone -Umeclidin-Vilant (TRELEGY ELLIPTA ) 100-62.5-25 MCG/ACT AEPB Inhale 1 puff into the lungs daily. 1 each 11   insulin  glargine (LANTUS ) 100 UNIT/ML injection Inject 6 Units into the skin daily.      Lancets (ONETOUCH DELICA PLUS LANCET33G) MISC Apply topically.     metFORMIN  (GLUCOPHAGE ) 500 MG tablet TAKE 1 TABLET(500 MG) BY MOUTH DAILY 90 tablet 3   moxifloxacin  (VIGAMOX ) 0.5 % ophthalmic solution Place 1 drop into both eyes 3 (three) times daily. 3 mL 0   nadolol  (CORGARD ) 20 MG tablet TAKE 1 TABLET(20 MG) BY MOUTH DAILY 90 tablet 3   omeprazole  (PRILOSEC) 40 MG capsule TAKE 1 CAPSULE BY MOUTH DAILY BEFORE BREAKFAST 90 capsule 3   promethazine -dextromethorphan  (PROMETHAZINE -DM) 6.25-15 MG/5ML syrup Take 5 mLs by mouth 4 (four) times daily as needed for cough. 240 mL 0   No facility-administered medications prior to visit.     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      Past Surgical History:  Procedure Laterality Date   BIOPSY  10/23/2017    Procedure: BIOPSY;  Surgeon: Teressa Toribio SQUIBB, MD;  Location: [facility] ENDOSCOPY;  Service: Endoscopy;;   BIOPSY  03/03/2018   Procedure: BIOPSY;  Surgeon: Wilhelmenia Aloha Raddle., MD;  Location: [facility] ENDOSCOPY;  Service: Gastroenterology;;   COLONOSCOPY WITH PROPOFOL  N/A 10/23/2017   Procedure: COLONOSCOPY WITH PROPOFOL ;  Surgeon: Teressa Toribio SQUIBB, MD;  Location: [facility] ENDOSCOPY;  Service: Endoscopy;  Laterality: N/A;   COMPLEX WOUND CLOSURE Right 12/15/2015   Procedure: COMPLEX WOUND CLOSURE;  Surgeon: Franky Curia, MD;  Location: [facility] OR;  Service: Orthopedics;  Laterality: Right;   ESOPHAGOGASTRODUODENOSCOPY (EGD) WITH PROPOFOL  N/A 10/23/2017   Procedure: ESOPHAGOGASTRODUODENOSCOPY (EGD) WITH PROPOFOL ;  Surgeon: Teressa Toribio SQUIBB, MD;  Location: [facility] ENDOS[facility];  Service: Endoscopy;  Laterality: N/A;   ESOPHAGOGASTRODUODENOSCOPY (EGD) WITH PROPOFOL  N/A 03/03/2018   Procedure: ESOPHAGOGASTRODUODENOSCOPY (EGD) WITH PROPOFOL ;  Surgeon: Wilhelmenia Aloha Raddle., MD;  Location[facility] ENDOSCOPY;  Service: Gastroenterology;  Laterality: N/A;   I & D EXTREMITY Right 12/15/2015   Procedure: IRRIGATION AND DEBRIDEMENT AND REVISION AMPUTATION RIGHT RING FINGER;  Surgeon: Franky Curia, MD;  Location: [facility] OR;  Service: Orthopedics;  Laterality: Right;       Review of Systems  Objective:   BP (!) 142/71   Pulse 72   Temp 97.7 F (36.5 C) (Oral)   Wt 122 lb 9.6 oz (  55.6 kg)   SpO2 100%   BMI 23.94 kg/m  Nursing note and vital signs reviewed.  Physical Exam      01/15/2024   11:09 AM 05/27/2023    3:27 PM 04/28/2023    3:52 PM 10/14/2022    4:01 PM 08/08/2022    2:12 PM  Depression screen PHQ 2/9  Decreased Interest 0 0 0 0 0  Down, Depressed, Hopeless 0 0 0 0 0  PHQ - 2 Score 0 0 0 0 0     Assessment & Plan:    Patient Active Problem List   Diagnosis Date Noted   Hepatitis B 02/17/2015    Priority: High   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) 11/25/2014    Priority: High   Lower respiratory infection  05/27/2023   Encounter for well adult exam with abnormal findings 11/02/2022   Finger laceration 10/30/2022   Chronic venous insufficiency 08/08/2022   Hepatoma (HCC) 01/31/2022   Moderate COPD (chronic obstructive pulmonary disease) (HCC) 01/31/2022   Liver cirrhosis secondary to NASH (HCC)    Chronic cough 12/08/2019   Pancytopenia (HCC) 12/03/2017   Abnormal echocardiogram 12/03/2017   History of hepatitis B 12/03/2017   Type 2 diabetes mellitus with other specified complication, without long-term current use of insulin  (HCC)    Esophageal varices without bleeding (HCC)    Heart murmur 08/01/2017   Thrombocytopenia 11/25/2014   History of TB (tuberculosis) 07/16/2010     Problem List Items Addressed This Visit   None    I am having Naziah Delauter maintain his insulin  glargine, B-D UF III MINI PEN NEEDLES, OneTouch Delica Plus Lancet33G, omeprazole , nadolol , promethazine -dextromethorphan , Trelegy Ellipta , albuterol , metFORMIN , moxifloxacin , and emtricitabine -tenofovir  AF.   No orders of the defined types were placed in this encounter.    Follow-up: No follow-ups on file. or sooner if needed.   Cathlyn July, MSN, FNP-C Nurse Practitioner [facility] for [facility] Main number: [phone]      [1]  Allergies Allergen Reactions   Other     Yellow cough medicine makes him dizzy   "

## 2024-04-28 NOTE — Telephone Encounter (Signed)
 Medication was picked up at [facility] on 04/23/24

## 2024-04-28 NOTE — Patient Instructions (Addendum)
 Nice to see you.  We will check your lab work today.  Continue to take your medication daily as prescribed.  Refills are available here for pick up monthly.   Plan for follow up in 6 months or sooner if needed with lab work on the same day.  Have a great day and stay safe!

## 2024-04-29 ENCOUNTER — Encounter: Payer: Self-pay | Admitting: Family

## 2024-04-29 NOTE — Assessment & Plan Note (Signed)
 Mr. Dettman has well controlled Hepatitis B virus with good adherence and tolerance to Descovy . Reviewed previous lab work and imaging and discussed plan of care to check lab work and continue with current dose of Descovy . No signs/symptoms concerning for flare. Plan for follow up in 6 months or sooner if needed with lab work on the same day.

## 2024-04-29 NOTE — Assessment & Plan Note (Signed)
 Mr. Bowdish appears to have adequately compensated cirrhosis with Child Pugh Score A and no evidence of hepatocellular carcinoma on recent MRI.  Not currently on medication. Encouraged to follow up with GI for routine cirrhosis management as appropriate.

## 2024-05-01 LAB — HEPATIC FUNCTION PANEL
AG Ratio: 1.3 (calc) (ref 1.0–2.5)
ALT: 12 U/L (ref 9–46)
AST: 18 U/L (ref 10–35)
Albumin: 4.1 g/dL (ref 3.6–5.1)
Alkaline phosphatase (APISO): 63 U/L (ref 35–144)
Bilirubin, Direct: 0.4 mg/dL — ABNORMAL HIGH (ref 0.0–0.2)
Globulin: 3.1 g/dL (ref 1.9–3.7)
Indirect Bilirubin: 1.1 mg/dL (ref 0.2–1.2)
Total Bilirubin: 1.5 mg/dL — ABNORMAL HIGH (ref 0.2–1.2)
Total Protein: 7.2 g/dL (ref 6.1–8.1)

## 2024-05-01 LAB — TEST AUTHORIZATION: TEST NAME:: 498

## 2024-05-01 LAB — CBC
HCT: 34.5 % — ABNORMAL LOW (ref 39.4–51.1)
Hemoglobin: 11.4 g/dL — ABNORMAL LOW (ref 13.2–17.1)
MCH: 29.8 pg (ref 27.0–33.0)
MCHC: 33 g/dL (ref 31.6–35.4)
MCV: 90.3 fL (ref 81.4–101.7)
Platelets: 22 10*3/uL — ABNORMAL LOW (ref 140–400)
RBC: 3.82 Million/uL — ABNORMAL LOW (ref 4.20–5.80)
RDW: 14.7 % (ref 11.0–15.0)
WBC: 1.8 10*3/uL — ABNORMAL LOW (ref 3.8–10.8)

## 2024-05-01 LAB — PROTIME-INR
INR: 1.2 — ABNORMAL HIGH
Prothrombin Time: 13 s — ABNORMAL HIGH (ref 9.0–11.5)

## 2024-05-01 LAB — AFP TUMOR MARKER: AFP-Tumor Marker: 3 ng/mL

## 2024-05-01 LAB — HEPATITIS B DNA, ULTRAQUANTITATIVE, PCR
Hepatitis B DNA: 24 [IU]/mL — ABNORMAL HIGH
Hepatitis B virus DNA: 1.38 {Log_IU}/mL — ABNORMAL HIGH

## 2024-05-01 LAB — HEPATITIS B SURFACE ANTIGEN: Hepatitis B Surface Ag: REACTIVE — AB

## 2024-05-12 ENCOUNTER — Telehealth: Payer: Self-pay

## 2024-05-12 NOTE — Telephone Encounter (Signed)
 [facility] Patient Advocate Encounter  Patient's medications DESCOVY  have been couriered to [facility] from [facility] [employer] and will be picked up at [facility].  Charmaine Sharps, CPhT Specialty Pharmacy Patient [facility] for Infectious Disease Phone: [phone] Fax:  [phone]

## 2024-10-21 ENCOUNTER — Ambulatory Visit: Payer: Self-pay | Admitting: Family
